# Patient Record
Sex: Female | Born: 1940
Health system: Southern US, Community
[De-identification: ages and names within clinical notes are randomized; demographics above are authoritative.]

## PROBLEM LIST (undated history)

## (undated) DIAGNOSIS — Z95828 Presence of other vascular implants and grafts: Secondary | ICD-10-CM

## (undated) DIAGNOSIS — E119 Type 2 diabetes mellitus without complications: Secondary | ICD-10-CM

## (undated) DIAGNOSIS — D126 Benign neoplasm of colon, unspecified: Secondary | ICD-10-CM

## (undated) DIAGNOSIS — E349 Endocrine disorder, unspecified: Secondary | ICD-10-CM

## (undated) DIAGNOSIS — E785 Hyperlipidemia, unspecified: Secondary | ICD-10-CM

## (undated) DIAGNOSIS — N189 Chronic kidney disease, unspecified: Secondary | ICD-10-CM

## (undated) DIAGNOSIS — G4733 Obstructive sleep apnea (adult) (pediatric): Secondary | ICD-10-CM

## (undated) DIAGNOSIS — G709 Myoneural disorder, unspecified: Secondary | ICD-10-CM

## (undated) DIAGNOSIS — Z87440 Personal history of urinary (tract) infections: Secondary | ICD-10-CM

## (undated) DIAGNOSIS — G63 Polyneuropathy in diseases classified elsewhere: Secondary | ICD-10-CM

## (undated) DIAGNOSIS — K219 Gastro-esophageal reflux disease without esophagitis: Secondary | ICD-10-CM

## (undated) DIAGNOSIS — E875 Hyperkalemia: Secondary | ICD-10-CM

## (undated) DIAGNOSIS — Z9989 Dependence on other enabling machines and devices: Secondary | ICD-10-CM

## (undated) DIAGNOSIS — N2581 Secondary hyperparathyroidism of renal origin: Secondary | ICD-10-CM

## (undated) DIAGNOSIS — D649 Anemia, unspecified: Secondary | ICD-10-CM

## (undated) DIAGNOSIS — Z8719 Personal history of other diseases of the digestive system: Secondary | ICD-10-CM

## (undated) DIAGNOSIS — M1990 Unspecified osteoarthritis, unspecified site: Secondary | ICD-10-CM

## (undated) DIAGNOSIS — M4802 Spinal stenosis, cervical region: Secondary | ICD-10-CM

## (undated) DIAGNOSIS — E13319 Other specified diabetes mellitus with unspecified diabetic retinopathy without macular edema: Secondary | ICD-10-CM

## (undated) DIAGNOSIS — H409 Unspecified glaucoma: Secondary | ICD-10-CM

## (undated) DIAGNOSIS — H269 Unspecified cataract: Secondary | ICD-10-CM

## (undated) DIAGNOSIS — I890 Lymphedema, not elsewhere classified: Secondary | ICD-10-CM

## (undated) DIAGNOSIS — I10 Essential (primary) hypertension: Secondary | ICD-10-CM

## (undated) DIAGNOSIS — R195 Other fecal abnormalities: Secondary | ICD-10-CM

## (undated) DIAGNOSIS — M48061 Spinal stenosis, lumbar region without neurogenic claudication: Secondary | ICD-10-CM

## (undated) DIAGNOSIS — E669 Obesity, unspecified: Secondary | ICD-10-CM

## (undated) HISTORY — DX: Anemia, unspecified: D64.9

## (undated) HISTORY — PX: VEIN LIGATION AND STRIPPING: SHX2653

## (undated) HISTORY — DX: Secondary hyperparathyroidism of renal origin: N25.81

## (undated) HISTORY — DX: Hyperkalemia: E87.5

## (undated) HISTORY — DX: Type 2 diabetes mellitus without complications: E11.9

## (undated) HISTORY — DX: Unspecified cataract: H26.9

## (undated) HISTORY — DX: Endocrine disorder, unspecified: G63

## (undated) HISTORY — DX: Spinal stenosis, lumbar region without neurogenic claudication: M48.061

## (undated) HISTORY — DX: Presence of other vascular implants and grafts: Z95.828

## (undated) HISTORY — DX: Unspecified osteoarthritis, unspecified site: M19.90

## (undated) HISTORY — DX: Other specified diabetes mellitus with unspecified diabetic retinopathy without macular edema: E13.319

## (undated) HISTORY — DX: Unspecified glaucoma: H40.9

## (undated) HISTORY — DX: Essential (primary) hypertension: I10

## (undated) HISTORY — DX: Spinal stenosis, cervical region: M48.02

## (undated) HISTORY — DX: Personal history of other diseases of the digestive system: Z87.19

## (undated) HISTORY — DX: Dependence on other enabling machines and devices: Z99.89

## (undated) HISTORY — DX: Gastro-esophageal reflux disease without esophagitis: K21.9

## (undated) HISTORY — DX: Obstructive sleep apnea (adult) (pediatric): G47.33

## (undated) HISTORY — DX: Hyperlipidemia, unspecified: E78.5

## (undated) HISTORY — DX: Benign neoplasm of colon, unspecified: D12.6

## (undated) HISTORY — PX: OTHER SURGICAL HISTORY: SHX169

## (undated) HISTORY — PX: BACK SURGERY: SHX140

## (undated) HISTORY — PX: CHOLECYSTECTOMY: SHX55

## (undated) HISTORY — DX: Personal history of urinary (tract) infections: Z87.440

## (undated) HISTORY — DX: Chronic kidney disease, unspecified: N18.9

## (undated) HISTORY — DX: Endocrine disorder, unspecified: E34.9

## (undated) HISTORY — DX: Polyneuropathy in diseases classified elsewhere: E34.9

---

## 2003-10-10 ENCOUNTER — Other Ambulatory Visit: Payer: Self-pay

## 2004-11-13 ENCOUNTER — Ambulatory Visit: Payer: Self-pay | Admitting: Internal Medicine

## 2005-03-11 ENCOUNTER — Emergency Department: Payer: Self-pay | Admitting: Emergency Medicine

## 2005-12-04 ENCOUNTER — Ambulatory Visit: Payer: Self-pay | Admitting: Unknown Physician Specialty

## 2006-01-12 ENCOUNTER — Ambulatory Visit: Payer: Self-pay | Admitting: Unknown Physician Specialty

## 2006-03-11 ENCOUNTER — Ambulatory Visit: Payer: Self-pay | Admitting: Internal Medicine

## 2007-03-31 ENCOUNTER — Ambulatory Visit: Payer: Self-pay | Admitting: Family Medicine

## 2009-01-07 ENCOUNTER — Emergency Department: Payer: Self-pay | Admitting: Emergency Medicine

## 2010-01-12 ENCOUNTER — Inpatient Hospital Stay: Payer: Self-pay | Admitting: Internal Medicine

## 2010-03-19 ENCOUNTER — Encounter: Payer: Self-pay | Admitting: Neurosurgery

## 2010-04-09 ENCOUNTER — Encounter: Payer: Self-pay | Admitting: Neurosurgery

## 2010-05-08 ENCOUNTER — Encounter: Payer: Self-pay | Admitting: Neurosurgery

## 2010-07-10 ENCOUNTER — Ambulatory Visit: Payer: Self-pay | Admitting: Family Medicine

## 2010-07-29 ENCOUNTER — Ambulatory Visit: Payer: Self-pay | Admitting: Family Medicine

## 2011-04-10 ENCOUNTER — Ambulatory Visit: Payer: Self-pay | Admitting: Ophthalmology

## 2011-04-10 DIAGNOSIS — I10 Essential (primary) hypertension: Secondary | ICD-10-CM

## 2011-04-21 ENCOUNTER — Ambulatory Visit: Payer: Self-pay | Admitting: Ophthalmology

## 2011-05-22 ENCOUNTER — Ambulatory Visit: Payer: Self-pay | Admitting: Unknown Physician Specialty

## 2011-06-08 ENCOUNTER — Ambulatory Visit: Payer: Self-pay | Admitting: Internal Medicine

## 2011-06-12 ENCOUNTER — Ambulatory Visit: Payer: Self-pay | Admitting: Internal Medicine

## 2011-06-16 LAB — HEPATIC FUNCTION PANEL A (ARMC)
Alkaline Phosphatase: 98 U/L (ref 50–136)
Bilirubin, Direct: 0.1 mg/dL (ref 0.00–0.20)
Bilirubin,Total: 0.4 mg/dL (ref 0.2–1.0)
SGOT(AST): 38 U/L — ABNORMAL HIGH (ref 15–37)

## 2011-06-16 LAB — LACTATE DEHYDROGENASE: LDH: 254 U/L — ABNORMAL HIGH (ref 84–246)

## 2011-06-16 LAB — CREATININE, SERUM
Creatinine: 1.43 mg/dL — ABNORMAL HIGH (ref 0.60–1.30)
EGFR (African American): 46 — ABNORMAL LOW
EGFR (Non-African Amer.): 38 — ABNORMAL LOW

## 2011-06-16 LAB — CBC CANCER CENTER
Eosinophil %: 3.5 %
HGB: 11.1 g/dL — ABNORMAL LOW (ref 12.0–16.0)
Lymphocyte #: 1.9 x10 3/mm (ref 1.0–3.6)
Lymphocyte %: 34.8 %
MCHC: 32.8 g/dL (ref 32.0–36.0)
MCV: 91.1 fL (ref 80–100)
Monocyte %: 7.5 %
Platelet: 244 x10 3/mm (ref 150–440)
RBC: 3.71 10*6/uL — ABNORMAL LOW (ref 3.80–5.20)
RDW: 13.5 % (ref 11.5–14.5)
WBC: 5.6 x10 3/mm (ref 3.6–11.0)

## 2011-06-16 LAB — RETICULOCYTES
Absolute Retic Count: 0.113 10*6/uL — ABNORMAL HIGH (ref 0.024–0.084)
Reticulocyte: 3.1 % — ABNORMAL HIGH (ref 0.5–1.5)

## 2011-06-16 LAB — IRON AND TIBC
Iron Saturation: 25 %
Unbound Iron-Bind.Cap.: 258 ug/dL

## 2011-06-19 LAB — PROT IMMUNOELECTROPHORES(ARMC)

## 2011-07-07 LAB — CANCER CENTER HEMOGLOBIN: HGB: 11.1 g/dL — ABNORMAL LOW (ref 12.0–16.0)

## 2011-07-08 ENCOUNTER — Ambulatory Visit: Payer: Self-pay | Admitting: Internal Medicine

## 2011-08-08 ENCOUNTER — Ambulatory Visit: Payer: Self-pay | Admitting: Internal Medicine

## 2011-08-18 ENCOUNTER — Ambulatory Visit: Payer: Self-pay | Admitting: Internal Medicine

## 2011-08-18 LAB — URINALYSIS, COMPLETE
Bilirubin,UR: NEGATIVE
Ketone: NEGATIVE
Leukocyte Esterase: NEGATIVE
Nitrite: NEGATIVE
Ph: 5 (ref 4.5–8.0)
Protein: NEGATIVE
Specific Gravity: 1.015 (ref 1.003–1.030)
Squamous Epithelial: >30

## 2011-08-18 LAB — CBC CANCER CENTER
Basophil #: 0.1 x10 3/mm (ref 0.0–0.1)
Basophil %: 1.1 %
Eosinophil #: 0.2 x10 3/mm (ref 0.0–0.7)
HCT: 33.8 % — ABNORMAL LOW (ref 35.0–47.0)
Lymphocyte #: 1.6 x10 3/mm (ref 1.0–3.6)
MCHC: 32.7 g/dL (ref 32.0–36.0)
MCV: 93 fL (ref 80–100)
Monocyte #: 0.5 x10 3/mm (ref 0.2–0.9)
Neutrophil #: 3.9 x10 3/mm (ref 1.4–6.5)
Neutrophil %: 62.4 %
Platelet: 226 x10 3/mm (ref 150–440)
RDW: 14.3 % (ref 11.5–14.5)

## 2011-08-18 LAB — CREATININE, SERUM
EGFR (African American): 44 — ABNORMAL LOW
EGFR (Non-African Amer.): 38 — ABNORMAL LOW

## 2011-08-18 LAB — SGOT (AST)(ARMC): SGOT(AST): 22 U/L (ref 15–37)

## 2011-08-19 LAB — URINE CULTURE

## 2011-08-28 ENCOUNTER — Ambulatory Visit: Payer: Self-pay | Admitting: Family Medicine

## 2011-09-07 ENCOUNTER — Ambulatory Visit: Payer: Self-pay | Admitting: Internal Medicine

## 2011-09-30 DIAGNOSIS — M48062 Spinal stenosis, lumbar region with neurogenic claudication: Secondary | ICD-10-CM | POA: Insufficient documentation

## 2011-10-13 ENCOUNTER — Ambulatory Visit: Payer: Self-pay | Admitting: Internal Medicine

## 2011-10-13 LAB — CBC CANCER CENTER
Basophil #: 0 x10 3/mm (ref 0.0–0.1)
Eosinophil #: 0.2 x10 3/mm (ref 0.0–0.7)
Lymphocyte %: 25.4 %
MCH: 30.2 pg (ref 26.0–34.0)
MCHC: 32.9 g/dL (ref 32.0–36.0)
Monocyte %: 8.2 %
Platelet: 220 x10 3/mm (ref 150–440)
RDW: 14.2 % (ref 11.5–14.5)

## 2011-10-20 ENCOUNTER — Ambulatory Visit: Payer: Self-pay | Admitting: Pain Medicine

## 2011-10-28 ENCOUNTER — Ambulatory Visit: Payer: Self-pay | Admitting: Pain Medicine

## 2011-11-08 ENCOUNTER — Ambulatory Visit: Payer: Self-pay | Admitting: Internal Medicine

## 2011-11-24 ENCOUNTER — Ambulatory Visit: Payer: Self-pay | Admitting: Pain Medicine

## 2011-12-07 ENCOUNTER — Ambulatory Visit: Payer: Self-pay | Admitting: Pain Medicine

## 2011-12-08 ENCOUNTER — Ambulatory Visit: Payer: Self-pay | Admitting: Internal Medicine

## 2011-12-08 LAB — CBC CANCER CENTER
Basophil #: 0.1 x10 3/mm (ref 0.0–0.1)
Basophil %: 0.4 %
Eosinophil #: 0 x10 3/mm (ref 0.0–0.7)
HCT: 33.4 % — ABNORMAL LOW (ref 35.0–47.0)
Lymphocyte #: 1.7 x10 3/mm (ref 1.0–3.6)
MCHC: 32.2 g/dL (ref 32.0–36.0)
MCV: 92 fL (ref 80–100)
Monocyte #: 1 x10 3/mm — ABNORMAL HIGH (ref 0.2–0.9)
Monocyte %: 7.8 %
Neutrophil #: 10.2 x10 3/mm — ABNORMAL HIGH (ref 1.4–6.5)
Platelet: 235 x10 3/mm (ref 150–440)
RBC: 3.64 10*6/uL — ABNORMAL LOW (ref 3.80–5.20)
RDW: 14.1 % (ref 11.5–14.5)
WBC: 13 x10 3/mm — ABNORMAL HIGH (ref 3.6–11.0)

## 2011-12-29 ENCOUNTER — Ambulatory Visit: Payer: Self-pay | Admitting: Pain Medicine

## 2012-01-08 ENCOUNTER — Ambulatory Visit: Payer: Self-pay | Admitting: Internal Medicine

## 2012-01-13 ENCOUNTER — Ambulatory Visit: Payer: Self-pay | Admitting: Pain Medicine

## 2012-02-02 LAB — CBC CANCER CENTER
Basophil #: 0.1 x10 3/mm (ref 0.0–0.1)
Basophil %: 0.8 %
Eosinophil %: 3 %
HCT: 34.9 % — ABNORMAL LOW (ref 35.0–47.0)
Lymphocyte #: 1.8 x10 3/mm (ref 1.0–3.6)
MCH: 30.1 pg (ref 26.0–34.0)
MCV: 93 fL (ref 80–100)
Monocyte #: 0.6 x10 3/mm (ref 0.2–0.9)
Monocyte %: 8.3 %
Neutrophil %: 62.8 %
Platelet: 208 x10 3/mm (ref 150–440)
RBC: 3.77 10*6/uL — ABNORMAL LOW (ref 3.80–5.20)
WBC: 7.2 x10 3/mm (ref 3.6–11.0)

## 2012-02-02 LAB — CREATININE, SERUM
Creatinine: 1.46 mg/dL — ABNORMAL HIGH (ref 0.60–1.30)
EGFR (Non-African Amer.): 35 — ABNORMAL LOW

## 2012-02-07 ENCOUNTER — Ambulatory Visit: Payer: Self-pay | Admitting: Internal Medicine

## 2012-02-17 ENCOUNTER — Ambulatory Visit: Payer: Self-pay | Admitting: Pain Medicine

## 2012-03-07 ENCOUNTER — Ambulatory Visit: Payer: Self-pay | Admitting: Pain Medicine

## 2012-03-29 ENCOUNTER — Ambulatory Visit: Payer: Self-pay | Admitting: Internal Medicine

## 2012-03-29 LAB — CBC CANCER CENTER
HGB: 10.5 g/dL — ABNORMAL LOW (ref 12.0–16.0)
Lymphocyte #: 1.6 x10 3/mm (ref 1.0–3.6)
Lymphocyte %: 29.4 %
MCH: 29.7 pg (ref 26.0–34.0)
MCV: 91 fL (ref 80–100)
Platelet: 215 x10 3/mm (ref 150–440)
RBC: 3.52 10*6/uL — ABNORMAL LOW (ref 3.80–5.20)
RDW: 15.4 % — ABNORMAL HIGH (ref 11.5–14.5)
WBC: 5.5 x10 3/mm (ref 3.6–11.0)

## 2012-03-31 ENCOUNTER — Ambulatory Visit: Payer: Self-pay | Admitting: Pain Medicine

## 2012-04-09 ENCOUNTER — Ambulatory Visit: Payer: Self-pay | Admitting: Internal Medicine

## 2012-04-11 ENCOUNTER — Ambulatory Visit: Payer: Self-pay | Admitting: Pain Medicine

## 2012-04-26 LAB — CANCER CENTER HEMOGLOBIN: HGB: 10.3 g/dL — ABNORMAL LOW (ref 12.0–16.0)

## 2012-04-26 LAB — OCCULT BLOOD X 1 CARD TO LAB, STOOL
Occult Blood, Feces: NEGATIVE
Occult Blood, Feces: NEGATIVE
Occult Blood, Feces: NEGATIVE

## 2012-05-03 ENCOUNTER — Ambulatory Visit: Payer: Self-pay | Admitting: Pain Medicine

## 2012-05-07 ENCOUNTER — Ambulatory Visit: Payer: Self-pay | Admitting: Internal Medicine

## 2012-06-01 ENCOUNTER — Ambulatory Visit: Payer: Self-pay | Admitting: Pain Medicine

## 2012-06-21 ENCOUNTER — Ambulatory Visit: Payer: Self-pay | Admitting: Internal Medicine

## 2012-06-21 ENCOUNTER — Ambulatory Visit: Payer: Self-pay | Admitting: Pain Medicine

## 2012-06-21 LAB — CBC CANCER CENTER
Basophil #: 0.1 x10 3/mm (ref 0.0–0.1)
Basophil %: 0.8 %
HGB: 10.9 g/dL — ABNORMAL LOW (ref 12.0–16.0)
Lymphocyte #: 1.8 x10 3/mm (ref 1.0–3.6)
MCH: 29.6 pg (ref 26.0–34.0)
MCHC: 32.1 g/dL (ref 32.0–36.0)
MCV: 92 fL (ref 80–100)
Neutrophil %: 56.8 %
WBC: 6.4 x10 3/mm (ref 3.6–11.0)

## 2012-07-07 ENCOUNTER — Ambulatory Visit: Payer: Self-pay | Admitting: Internal Medicine

## 2012-07-21 ENCOUNTER — Ambulatory Visit: Payer: Self-pay | Admitting: Pain Medicine

## 2012-08-16 ENCOUNTER — Ambulatory Visit: Payer: Self-pay | Admitting: Internal Medicine

## 2012-08-16 LAB — CANCER CENTER HEMOGLOBIN: HGB: 10.5 g/dL — ABNORMAL LOW (ref 12.0–16.0)

## 2012-09-06 ENCOUNTER — Ambulatory Visit: Payer: Self-pay | Admitting: Internal Medicine

## 2012-10-11 ENCOUNTER — Ambulatory Visit: Payer: Self-pay | Admitting: Internal Medicine

## 2012-10-11 LAB — CBC CANCER CENTER
Basophil %: 0.9 %
Eosinophil #: 0.2 x10 3/mm (ref 0.0–0.7)
Eosinophil %: 3.2 %
HCT: 31.4 % — ABNORMAL LOW (ref 35.0–47.0)
HGB: 10.4 g/dL — ABNORMAL LOW (ref 12.0–16.0)
Lymphocyte #: 2 x10 3/mm (ref 1.0–3.6)
Lymphocyte %: 29.1 %
MCHC: 33 g/dL (ref 32.0–36.0)
MCV: 91 fL (ref 80–100)
Monocyte #: 0.6 x10 3/mm (ref 0.2–0.9)
Monocyte %: 8.8 %
Neutrophil #: 4 x10 3/mm (ref 1.4–6.5)
Neutrophil %: 58 %
RDW: 14.3 % (ref 11.5–14.5)
WBC: 6.9 x10 3/mm (ref 3.6–11.0)

## 2012-10-23 ENCOUNTER — Emergency Department: Payer: Self-pay | Admitting: Emergency Medicine

## 2012-10-23 LAB — BASIC METABOLIC PANEL
Anion Gap: 4 — ABNORMAL LOW (ref 7–16)
BUN: 21 mg/dL — ABNORMAL HIGH (ref 7–18)
Calcium, Total: 10.3 mg/dL — ABNORMAL HIGH (ref 8.5–10.1)
Chloride: 107 mmol/L (ref 98–107)
Creatinine: 1.23 mg/dL (ref 0.60–1.30)
EGFR (Non-African Amer.): 43 — ABNORMAL LOW
Glucose: 179 mg/dL — ABNORMAL HIGH (ref 65–99)
Potassium: 4.4 mmol/L (ref 3.5–5.1)
Sodium: 137 mmol/L (ref 136–145)

## 2012-10-23 LAB — CBC
HCT: 32.5 % — ABNORMAL LOW (ref 35.0–47.0)
HGB: 10.9 g/dL — ABNORMAL LOW (ref 12.0–16.0)
MCH: 30.4 pg (ref 26.0–34.0)
RBC: 3.6 10*6/uL — ABNORMAL LOW (ref 3.80–5.20)
RDW: 14.4 % (ref 11.5–14.5)
WBC: 8.4 10*3/uL (ref 3.6–11.0)

## 2012-10-24 LAB — URINALYSIS, COMPLETE
Bacteria: NONE SEEN
Bilirubin,UR: NEGATIVE
Glucose,UR: NEGATIVE mg/dL (ref 0–75)
Ph: 5 (ref 4.5–8.0)
Squamous Epithelial: 10
WBC UR: 276 /HPF (ref 0–5)

## 2012-10-26 LAB — URINE CULTURE

## 2012-11-07 ENCOUNTER — Ambulatory Visit: Payer: Self-pay | Admitting: Internal Medicine

## 2012-12-06 LAB — CANCER CENTER HEMOGLOBIN: HGB: 10.4 g/dL — ABNORMAL LOW (ref 12.0–16.0)

## 2012-12-07 ENCOUNTER — Ambulatory Visit: Payer: Self-pay | Admitting: Internal Medicine

## 2013-01-24 ENCOUNTER — Ambulatory Visit: Payer: Self-pay | Admitting: Internal Medicine

## 2013-01-24 LAB — CBC CANCER CENTER
Basophil #: 0.1 x10 3/mm (ref 0.0–0.1)
Basophil %: 1 %
Eosinophil %: 3.1 %
HCT: 33.8 % — ABNORMAL LOW (ref 35.0–47.0)
Lymphocyte #: 1.6 x10 3/mm (ref 1.0–3.6)
MCH: 29.3 pg (ref 26.0–34.0)
MCHC: 31.6 g/dL — ABNORMAL LOW (ref 32.0–36.0)
MCV: 93 fL (ref 80–100)
Monocyte %: 7.3 %
Neutrophil %: 64.3 %
Platelet: 237 x10 3/mm (ref 150–440)
RDW: 14.2 % (ref 11.5–14.5)

## 2013-02-06 ENCOUNTER — Ambulatory Visit: Payer: Self-pay | Admitting: Internal Medicine

## 2013-03-21 ENCOUNTER — Ambulatory Visit: Payer: Self-pay | Admitting: Internal Medicine

## 2013-03-21 LAB — CANCER CENTER HEMOGLOBIN: HGB: 10.7 g/dL — AB (ref 12.0–16.0)

## 2013-04-09 ENCOUNTER — Ambulatory Visit: Payer: Self-pay | Admitting: Internal Medicine

## 2013-05-19 ENCOUNTER — Other Ambulatory Visit (HOSPITAL_COMMUNITY): Payer: Self-pay | Admitting: Internal Medicine

## 2013-06-20 ENCOUNTER — Ambulatory Visit: Payer: Self-pay | Admitting: Internal Medicine

## 2013-06-20 LAB — CBC CANCER CENTER
BASOS PCT: 1.1 %
Basophil #: 0.1 x10 3/mm (ref 0.0–0.1)
EOS ABS: 0.3 x10 3/mm (ref 0.0–0.7)
Eosinophil %: 4.7 %
HCT: 31.2 % — ABNORMAL LOW (ref 35.0–47.0)
HGB: 10 g/dL — ABNORMAL LOW (ref 12.0–16.0)
LYMPHS PCT: 29.3 %
Lymphocyte #: 1.9 x10 3/mm (ref 1.0–3.6)
MCH: 29.7 pg (ref 26.0–34.0)
MCHC: 32 g/dL (ref 32.0–36.0)
MCV: 93 fL (ref 80–100)
Monocyte #: 0.6 x10 3/mm (ref 0.2–0.9)
Monocyte %: 8.7 %
Neutrophil #: 3.6 x10 3/mm (ref 1.4–6.5)
Neutrophil %: 56.2 %
Platelet: 221 x10 3/mm (ref 150–440)
RBC: 3.37 10*6/uL — ABNORMAL LOW (ref 3.80–5.20)
RDW: 14.2 % (ref 11.5–14.5)
WBC: 6.4 x10 3/mm (ref 3.6–11.0)

## 2013-07-07 ENCOUNTER — Ambulatory Visit: Payer: Self-pay | Admitting: Internal Medicine

## 2013-07-11 ENCOUNTER — Ambulatory Visit: Payer: Self-pay | Admitting: Internal Medicine

## 2013-07-11 LAB — CANCER CENTER HEMOGLOBIN: HGB: 10.8 g/dL — AB (ref 12.0–16.0)

## 2013-08-07 ENCOUNTER — Ambulatory Visit: Payer: Self-pay | Admitting: Internal Medicine

## 2013-09-19 ENCOUNTER — Ambulatory Visit: Payer: Self-pay | Admitting: Internal Medicine

## 2013-09-19 LAB — CBC CANCER CENTER
Basophil #: 0.1 x10 3/mm (ref 0.0–0.1)
Basophil %: 0.9 %
Eosinophil #: 0.2 x10 3/mm (ref 0.0–0.7)
Eosinophil %: 3.3 %
HCT: 32.5 % — AB (ref 35.0–47.0)
HGB: 10.5 g/dL — ABNORMAL LOW (ref 12.0–16.0)
LYMPHS ABS: 2 x10 3/mm (ref 1.0–3.6)
LYMPHS PCT: 28.1 %
MCH: 29.9 pg (ref 26.0–34.0)
MCHC: 32.1 g/dL (ref 32.0–36.0)
MCV: 93 fL (ref 80–100)
MONO ABS: 0.6 x10 3/mm (ref 0.2–0.9)
MONOS PCT: 8.5 %
NEUTROS PCT: 59.2 %
Neutrophil #: 4.3 x10 3/mm (ref 1.4–6.5)
Platelet: 234 x10 3/mm (ref 150–440)
RBC: 3.49 10*6/uL — ABNORMAL LOW (ref 3.80–5.20)
RDW: 14.4 % (ref 11.5–14.5)
WBC: 7.2 x10 3/mm (ref 3.6–11.0)

## 2013-10-07 ENCOUNTER — Ambulatory Visit: Payer: Self-pay | Admitting: Internal Medicine

## 2014-01-23 ENCOUNTER — Ambulatory Visit: Payer: Self-pay | Admitting: Internal Medicine

## 2014-01-23 LAB — CBC CANCER CENTER
Basophil #: 0.1 x10 3/mm (ref 0.0–0.1)
Basophil %: 1.1 %
Eosinophil #: 0.2 x10 3/mm (ref 0.0–0.7)
Eosinophil %: 3.7 %
HCT: 31.6 % — ABNORMAL LOW (ref 35.0–47.0)
HGB: 10 g/dL — ABNORMAL LOW (ref 12.0–16.0)
Lymphocyte #: 1.5 x10 3/mm (ref 1.0–3.6)
Lymphocyte %: 30.8 %
MCH: 29.9 pg (ref 26.0–34.0)
MCHC: 31.7 g/dL — ABNORMAL LOW (ref 32.0–36.0)
MCV: 94 fL (ref 80–100)
MONOS PCT: 10.3 %
Monocyte #: 0.5 x10 3/mm (ref 0.2–0.9)
NEUTROS PCT: 54.1 %
Neutrophil #: 2.7 x10 3/mm (ref 1.4–6.5)
PLATELETS: 193 x10 3/mm (ref 150–440)
RBC: 3.36 10*6/uL — AB (ref 3.80–5.20)
RDW: 15.1 % — AB (ref 11.5–14.5)
WBC: 5 x10 3/mm (ref 3.6–11.0)

## 2014-02-06 ENCOUNTER — Ambulatory Visit: Payer: Self-pay | Admitting: Internal Medicine

## 2014-03-20 ENCOUNTER — Ambulatory Visit: Payer: Self-pay | Admitting: Ophthalmology

## 2014-03-20 LAB — HEMOGLOBIN: HGB: 10.8 g/dL — AB (ref 12.0–16.0)

## 2014-03-27 ENCOUNTER — Ambulatory Visit: Payer: Self-pay | Admitting: Ophthalmology

## 2014-05-23 ENCOUNTER — Ambulatory Visit
Admit: 2014-05-23 | Disposition: A | Discharge status: Home / Self Care | Payer: Self-pay | Attending: Internal Medicine | Admitting: Internal Medicine

## 2014-06-08 ENCOUNTER — Ambulatory Visit
Admit: 2014-06-08 | Disposition: A | Discharge status: Home / Self Care | Payer: Self-pay | Attending: Internal Medicine | Admitting: Internal Medicine

## 2014-07-01 NOTE — Op Note (Signed)
PATIENT NAME:  Sara Bright, Sara Bright MR#:  [id number] DATE OF BIRTH:  Mar 31, 1940  DATE OF PROCEDURE:  04/21/2011  PREOPERATIVE DIAGNOSIS: Visually significant cataract of the right eye.   POSTOPERATIVE DIAGNOSIS: Visually significant cataract of the right eye.   OPERATIVE PROCEDURE: Cataract extraction by phacoemulsification with implant of intraocular lens to right eye.   SURGEON: Birder Robson, MD.   ANESTHESIA:  1. Managed anesthesia care.  2. Topical tetracaine drops followed by 2% Xylocaine jelly applied in the preoperative holding area.   COMPLICATIONS: None.   TECHNIQUE:  Stop and chop.   DESCRIPTION OF PROCEDURE: The patient was examined and consented in the preoperative holding area where the aforementioned topical anesthesia was applied to the right eye and then brought back to the Operating Room where the right eye was prepped and draped in the usual sterile ophthalmic fashion and a lid speculum was placed. A paracentesis was created with the side port blade and the anterior chamber was filled with viscoelastic. A near clear corneal incision was performed with the steel keratome. A continuous curvilinear capsulorrhexis was performed with a cystotome followed by the capsulorrhexis forceps. Hydrodissection and hydrodelineation were carried out with BSS on a blunt cannula. The lens was removed in a stop and chop technique and the remaining cortical material was removed with the irrigation-aspiration handpiece. The capsular bag was inflated with viscoelastic and the ZCBOO 19.5-diopter lens, serial number [id number] was placed in the capsular bag without complication. The remaining viscoelastic was removed from the eye with the irrigation-aspiration handpiece. The wounds were hydrated. The anterior chamber was flushed with Miostat and the eye was inflated to physiologic pressure. The wounds were found to be water tight. The eye was dressed with Vigamox and Omnipred. The patient was given  protective glasses to wear throughout the day and a shield with which to sleep tonight. The patient was also given drops with which to begin a drop regimen today and will follow-up with me in one day.   ____________________________ Livingston Diones. Braylin Formby, MD wlp:slb D: 04/21/2011 21:45:47 ET T: 04/22/2011 09:10:43 ET JOB#: [id number]  cc: Tarini Carrier L. Dondre Catalfamo, MD, <Dictator> Livingston Diones Doyel Mulkern MD ELECTRONICALLY SIGNED 04/28/2011 12:26

## 2014-07-08 NOTE — Op Note (Signed)
PATIENT NAME:  Sara Bright, Sara Bright MR#:  [id number] DATE OF BIRTH:  May 25, 1940  DATE OF PROCEDURE:  03/27/2014  PREOPERATIVE DIAGNOSIS:  Nuclear sclerotic cataract of the left eye.   POSTOPERATIVE DIAGNOSIS:  Nuclear sclerotic cataract of the left eye.   OPERATIVE PROCEDURE:  Cataract extraction by phacoemulsification with implant of intraocular lens to left eye.   SURGEON:  Birder Robson, MD.   ANESTHESIA:  1. Managed anesthesia care.  2. Topical tetracaine drops followed by 2% Xylocaine jelly applied in the preoperative holding area.   COMPLICATIONS:  None.   TECHNIQUE:   Stop and chop.  DESCRIPTION OF PROCEDURE:  The patient was examined and consented in the preoperative holding area where the aforementioned topical anesthesia was applied to the left eye and then brought back to the operating room where the left eye was prepped and draped in the usual sterile ophthalmic fashion and a lid speculum was placed. A paracentesis was created with the side port blade and the anterior chamber was filled with viscoelastic. A near clear corneal incision was performed with the steel keratome. A continuous curvilinear capsulorrhexis was performed with a cystotome followed by the capsulorrhexis forceps. Hydrodissection and hydrodelineation were carried out with BSS on a blunt cannula. The lens was removed in a stop and chop technique and the remaining cortical material was removed with the irrigation-aspiration handpiece. The capsular bag was inflated with viscoelastic and the [employer] ZCB00 19.5-diopter lens, serial number [id number] was placed in the capsular bag without complication. The remaining viscoelastic was removed from the eye with the irrigation-aspiration handpiece. The wounds were hydrated. The anterior chamber was flushed with Miostat and the eye was inflated to physiologic pressure. 0.1 mL of cefuroxime concentration 10 mg/mL was placed in the anterior chamber. The wounds were found to be water  tight. The eye was dressed with Vigamox. The patient was given protective glasses to wear throughout the day and a shield with which to sleep tonight. The patient was also given drops with which to begin a drop regimen today and will follow-up with me in one day.    ____________________________ Livingston Diones. Chico Cawood, MD wlp:bm D: 03/27/2014 20:53:19 ET T: 03/27/2014 23:06:23 ET JOB#: [id number]  cc: Astaria Nanez L. Edit Ricciardelli, MD, <Dictator> Livingston Diones Seaira Byus MD ELECTRONICALLY SIGNED 03/28/2014 16:10

## 2014-09-25 ENCOUNTER — Ambulatory Visit: Payer: Medicare Other

## 2014-09-25 ENCOUNTER — Ambulatory Visit: Payer: Medicare Other | Admitting: Family Medicine

## 2014-11-07 ENCOUNTER — Other Ambulatory Visit: Payer: Self-pay | Admitting: Nurse Practitioner

## 2014-11-07 ENCOUNTER — Ambulatory Visit
Admission: RE | Admit: 2014-11-07 | Discharge: 2014-11-07 | Disposition: A | Discharge status: Home / Self Care | Payer: Medicare HMO | Source: Ambulatory Visit | Attending: Nurse Practitioner | Admitting: Nurse Practitioner

## 2014-11-07 DIAGNOSIS — Z1231 Encounter for screening mammogram for malignant neoplasm of breast: Secondary | ICD-10-CM | POA: Diagnosis not present

## 2014-12-11 DIAGNOSIS — R69 Illness, unspecified: Secondary | ICD-10-CM | POA: Diagnosis not present

## 2014-12-12 DIAGNOSIS — Z23 Encounter for immunization: Secondary | ICD-10-CM | POA: Diagnosis not present

## 2014-12-25 DIAGNOSIS — G629 Polyneuropathy, unspecified: Secondary | ICD-10-CM | POA: Diagnosis not present

## 2014-12-25 DIAGNOSIS — M79605 Pain in left leg: Secondary | ICD-10-CM | POA: Diagnosis not present

## 2014-12-29 DIAGNOSIS — R0602 Shortness of breath: Secondary | ICD-10-CM | POA: Diagnosis not present

## 2014-12-29 DIAGNOSIS — E11649 Type 2 diabetes mellitus with hypoglycemia without coma: Secondary | ICD-10-CM | POA: Diagnosis not present

## 2014-12-29 DIAGNOSIS — R9431 Abnormal electrocardiogram [ECG] [EKG]: Secondary | ICD-10-CM | POA: Diagnosis not present

## 2014-12-29 DIAGNOSIS — K5900 Constipation, unspecified: Secondary | ICD-10-CM | POA: Diagnosis not present

## 2014-12-29 DIAGNOSIS — R300 Dysuria: Secondary | ICD-10-CM | POA: Diagnosis not present

## 2014-12-29 DIAGNOSIS — R5383 Other fatigue: Secondary | ICD-10-CM | POA: Diagnosis not present

## 2014-12-29 DIAGNOSIS — R011 Cardiac murmur, unspecified: Secondary | ICD-10-CM | POA: Diagnosis not present

## 2014-12-29 DIAGNOSIS — R0600 Dyspnea, unspecified: Secondary | ICD-10-CM | POA: Diagnosis not present

## 2014-12-29 DIAGNOSIS — I10 Essential (primary) hypertension: Secondary | ICD-10-CM | POA: Diagnosis not present

## 2014-12-29 DIAGNOSIS — R600 Localized edema: Secondary | ICD-10-CM | POA: Diagnosis not present

## 2014-12-29 DIAGNOSIS — R531 Weakness: Secondary | ICD-10-CM | POA: Diagnosis not present

## 2014-12-29 DIAGNOSIS — I517 Cardiomegaly: Secondary | ICD-10-CM | POA: Diagnosis not present

## 2014-12-29 DIAGNOSIS — E1122 Type 2 diabetes mellitus with diabetic chronic kidney disease: Secondary | ICD-10-CM | POA: Diagnosis not present

## 2015-01-18 DIAGNOSIS — R69 Illness, unspecified: Secondary | ICD-10-CM | POA: Diagnosis not present

## 2015-01-23 DIAGNOSIS — R300 Dysuria: Secondary | ICD-10-CM | POA: Diagnosis not present

## 2015-01-23 DIAGNOSIS — E1122 Type 2 diabetes mellitus with diabetic chronic kidney disease: Secondary | ICD-10-CM | POA: Diagnosis not present

## 2015-01-23 DIAGNOSIS — M79605 Pain in left leg: Secondary | ICD-10-CM | POA: Diagnosis not present

## 2015-01-23 DIAGNOSIS — G629 Polyneuropathy, unspecified: Secondary | ICD-10-CM | POA: Diagnosis not present

## 2015-01-23 DIAGNOSIS — E1165 Type 2 diabetes mellitus with hyperglycemia: Secondary | ICD-10-CM | POA: Diagnosis not present

## 2015-01-23 DIAGNOSIS — N183 Chronic kidney disease, stage 3 (moderate): Secondary | ICD-10-CM | POA: Diagnosis not present

## 2015-02-13 DIAGNOSIS — R194 Change in bowel habit: Secondary | ICD-10-CM | POA: Diagnosis not present

## 2015-02-13 DIAGNOSIS — R600 Localized edema: Secondary | ICD-10-CM | POA: Diagnosis not present

## 2015-02-13 DIAGNOSIS — N183 Chronic kidney disease, stage 3 (moderate): Secondary | ICD-10-CM | POA: Diagnosis not present

## 2015-02-13 DIAGNOSIS — R0602 Shortness of breath: Secondary | ICD-10-CM | POA: Diagnosis not present

## 2015-02-19 DIAGNOSIS — E1165 Type 2 diabetes mellitus with hyperglycemia: Secondary | ICD-10-CM | POA: Diagnosis not present

## 2015-02-19 DIAGNOSIS — E1122 Type 2 diabetes mellitus with diabetic chronic kidney disease: Secondary | ICD-10-CM | POA: Diagnosis not present

## 2015-02-19 DIAGNOSIS — R0602 Shortness of breath: Secondary | ICD-10-CM | POA: Diagnosis not present

## 2015-02-19 DIAGNOSIS — N183 Chronic kidney disease, stage 3 (moderate): Secondary | ICD-10-CM | POA: Diagnosis not present

## 2015-02-19 DIAGNOSIS — I509 Heart failure, unspecified: Secondary | ICD-10-CM | POA: Diagnosis not present

## 2015-02-19 DIAGNOSIS — E875 Hyperkalemia: Secondary | ICD-10-CM | POA: Diagnosis not present

## 2015-02-19 DIAGNOSIS — R600 Localized edema: Secondary | ICD-10-CM | POA: Diagnosis not present

## 2015-03-06 DIAGNOSIS — M170 Bilateral primary osteoarthritis of knee: Secondary | ICD-10-CM | POA: Diagnosis not present

## 2015-03-10 DIAGNOSIS — G4733 Obstructive sleep apnea (adult) (pediatric): Secondary | ICD-10-CM | POA: Insufficient documentation

## 2015-03-21 DIAGNOSIS — E785 Hyperlipidemia, unspecified: Secondary | ICD-10-CM | POA: Diagnosis not present

## 2015-03-21 DIAGNOSIS — R600 Localized edema: Secondary | ICD-10-CM | POA: Diagnosis not present

## 2015-03-21 DIAGNOSIS — E875 Hyperkalemia: Secondary | ICD-10-CM | POA: Diagnosis not present

## 2015-03-21 DIAGNOSIS — N183 Chronic kidney disease, stage 3 (moderate): Secondary | ICD-10-CM | POA: Diagnosis not present

## 2015-03-21 DIAGNOSIS — I10 Essential (primary) hypertension: Secondary | ICD-10-CM | POA: Diagnosis not present

## 2015-03-21 DIAGNOSIS — D631 Anemia in chronic kidney disease: Secondary | ICD-10-CM | POA: Diagnosis not present

## 2015-04-02 DIAGNOSIS — R69 Illness, unspecified: Secondary | ICD-10-CM | POA: Diagnosis not present

## 2015-04-03 DIAGNOSIS — N183 Chronic kidney disease, stage 3 (moderate): Secondary | ICD-10-CM | POA: Diagnosis not present

## 2015-04-12 DIAGNOSIS — I10 Essential (primary) hypertension: Secondary | ICD-10-CM | POA: Diagnosis not present

## 2015-04-12 DIAGNOSIS — E1122 Type 2 diabetes mellitus with diabetic chronic kidney disease: Secondary | ICD-10-CM | POA: Diagnosis not present

## 2015-04-12 DIAGNOSIS — N2581 Secondary hyperparathyroidism of renal origin: Secondary | ICD-10-CM | POA: Diagnosis not present

## 2015-04-12 DIAGNOSIS — N183 Chronic kidney disease, stage 3 (moderate): Secondary | ICD-10-CM | POA: Diagnosis not present

## 2015-04-12 DIAGNOSIS — D631 Anemia in chronic kidney disease: Secondary | ICD-10-CM | POA: Diagnosis not present

## 2015-04-18 DIAGNOSIS — N189 Chronic kidney disease, unspecified: Secondary | ICD-10-CM | POA: Diagnosis not present

## 2015-04-19 ENCOUNTER — Other Ambulatory Visit: Payer: Self-pay | Admitting: Nephrology

## 2015-04-19 DIAGNOSIS — N1330 Unspecified hydronephrosis: Secondary | ICD-10-CM

## 2015-04-23 ENCOUNTER — Ambulatory Visit: Payer: Medicare HMO

## 2015-04-25 ENCOUNTER — Ambulatory Visit
Admission: RE | Admit: 2015-04-25 | Discharge: 2015-04-25 | Disposition: A | Discharge status: Home / Self Care | Payer: Medicare HMO | Source: Ambulatory Visit | Attending: Nephrology | Admitting: Nephrology

## 2015-04-25 DIAGNOSIS — N1330 Unspecified hydronephrosis: Secondary | ICD-10-CM | POA: Insufficient documentation

## 2015-04-29 DIAGNOSIS — H401111 Primary open-angle glaucoma, right eye, mild stage: Secondary | ICD-10-CM | POA: Diagnosis not present

## 2015-05-21 DIAGNOSIS — E1165 Type 2 diabetes mellitus with hyperglycemia: Secondary | ICD-10-CM | POA: Diagnosis not present

## 2015-05-21 DIAGNOSIS — E1122 Type 2 diabetes mellitus with diabetic chronic kidney disease: Secondary | ICD-10-CM | POA: Diagnosis not present

## 2015-05-21 DIAGNOSIS — G629 Polyneuropathy, unspecified: Secondary | ICD-10-CM | POA: Diagnosis not present

## 2015-05-21 DIAGNOSIS — E782 Mixed hyperlipidemia: Secondary | ICD-10-CM | POA: Diagnosis not present

## 2015-05-21 DIAGNOSIS — N183 Chronic kidney disease, stage 3 (moderate): Secondary | ICD-10-CM | POA: Diagnosis not present

## 2015-05-21 DIAGNOSIS — I10 Essential (primary) hypertension: Secondary | ICD-10-CM | POA: Diagnosis not present

## 2015-05-21 DIAGNOSIS — Z79899 Other long term (current) drug therapy: Secondary | ICD-10-CM | POA: Diagnosis not present

## 2015-05-29 DIAGNOSIS — H401111 Primary open-angle glaucoma, right eye, mild stage: Secondary | ICD-10-CM | POA: Diagnosis not present

## 2015-06-05 DIAGNOSIS — N2581 Secondary hyperparathyroidism of renal origin: Secondary | ICD-10-CM | POA: Diagnosis not present

## 2015-06-05 DIAGNOSIS — R809 Proteinuria, unspecified: Secondary | ICD-10-CM | POA: Diagnosis not present

## 2015-06-05 DIAGNOSIS — E1122 Type 2 diabetes mellitus with diabetic chronic kidney disease: Secondary | ICD-10-CM | POA: Diagnosis not present

## 2015-06-05 DIAGNOSIS — D631 Anemia in chronic kidney disease: Secondary | ICD-10-CM | POA: Diagnosis not present

## 2015-06-05 DIAGNOSIS — N183 Chronic kidney disease, stage 3 (moderate): Secondary | ICD-10-CM | POA: Diagnosis not present

## 2015-06-05 DIAGNOSIS — I10 Essential (primary) hypertension: Secondary | ICD-10-CM | POA: Diagnosis not present

## 2015-06-11 DIAGNOSIS — R69 Illness, unspecified: Secondary | ICD-10-CM | POA: Diagnosis not present

## 2015-06-25 DIAGNOSIS — J45909 Unspecified asthma, uncomplicated: Secondary | ICD-10-CM | POA: Diagnosis not present

## 2015-06-25 DIAGNOSIS — E6609 Other obesity due to excess calories: Secondary | ICD-10-CM | POA: Diagnosis not present

## 2015-06-25 DIAGNOSIS — R0609 Other forms of dyspnea: Secondary | ICD-10-CM | POA: Diagnosis not present

## 2015-06-25 DIAGNOSIS — I272 Other secondary pulmonary hypertension: Secondary | ICD-10-CM | POA: Diagnosis not present

## 2015-06-25 DIAGNOSIS — G479 Sleep disorder, unspecified: Secondary | ICD-10-CM | POA: Diagnosis not present

## 2015-06-25 DIAGNOSIS — R0602 Shortness of breath: Secondary | ICD-10-CM | POA: Diagnosis not present

## 2015-07-30 ENCOUNTER — Ambulatory Visit: Payer: Medicare HMO | Attending: Specialist

## 2015-07-30 DIAGNOSIS — G4733 Obstructive sleep apnea (adult) (pediatric): Secondary | ICD-10-CM | POA: Insufficient documentation

## 2015-07-30 DIAGNOSIS — R0683 Snoring: Secondary | ICD-10-CM | POA: Diagnosis present

## 2015-08-08 DIAGNOSIS — I272 Other secondary pulmonary hypertension: Secondary | ICD-10-CM | POA: Diagnosis not present

## 2015-08-08 DIAGNOSIS — R0609 Other forms of dyspnea: Secondary | ICD-10-CM | POA: Diagnosis not present

## 2015-08-08 DIAGNOSIS — G4733 Obstructive sleep apnea (adult) (pediatric): Secondary | ICD-10-CM | POA: Diagnosis not present

## 2015-08-21 DIAGNOSIS — G4733 Obstructive sleep apnea (adult) (pediatric): Secondary | ICD-10-CM | POA: Diagnosis not present

## 2015-09-11 DIAGNOSIS — R69 Illness, unspecified: Secondary | ICD-10-CM | POA: Diagnosis not present

## 2015-09-20 DIAGNOSIS — G4733 Obstructive sleep apnea (adult) (pediatric): Secondary | ICD-10-CM | POA: Diagnosis not present

## 2015-09-23 DIAGNOSIS — G4733 Obstructive sleep apnea (adult) (pediatric): Secondary | ICD-10-CM | POA: Diagnosis not present

## 2015-09-24 DIAGNOSIS — N2581 Secondary hyperparathyroidism of renal origin: Secondary | ICD-10-CM | POA: Diagnosis not present

## 2015-09-24 DIAGNOSIS — I10 Essential (primary) hypertension: Secondary | ICD-10-CM | POA: Diagnosis not present

## 2015-09-24 DIAGNOSIS — D631 Anemia in chronic kidney disease: Secondary | ICD-10-CM | POA: Diagnosis not present

## 2015-09-24 DIAGNOSIS — E1122 Type 2 diabetes mellitus with diabetic chronic kidney disease: Secondary | ICD-10-CM | POA: Diagnosis not present

## 2015-09-24 DIAGNOSIS — N183 Chronic kidney disease, stage 3 (moderate): Secondary | ICD-10-CM | POA: Diagnosis not present

## 2015-09-30 DIAGNOSIS — I10 Essential (primary) hypertension: Secondary | ICD-10-CM | POA: Diagnosis not present

## 2015-09-30 DIAGNOSIS — Z9989 Dependence on other enabling machines and devices: Secondary | ICD-10-CM | POA: Diagnosis not present

## 2015-09-30 DIAGNOSIS — E1122 Type 2 diabetes mellitus with diabetic chronic kidney disease: Secondary | ICD-10-CM | POA: Diagnosis not present

## 2015-09-30 DIAGNOSIS — I83029 Varicose veins of left lower extremity with ulcer of unspecified site: Secondary | ICD-10-CM | POA: Diagnosis not present

## 2015-09-30 DIAGNOSIS — D638 Anemia in other chronic diseases classified elsewhere: Secondary | ICD-10-CM | POA: Diagnosis not present

## 2015-09-30 DIAGNOSIS — G4733 Obstructive sleep apnea (adult) (pediatric): Secondary | ICD-10-CM | POA: Diagnosis not present

## 2015-09-30 DIAGNOSIS — N183 Chronic kidney disease, stage 3 (moderate): Secondary | ICD-10-CM | POA: Diagnosis not present

## 2015-10-02 DIAGNOSIS — E785 Hyperlipidemia, unspecified: Secondary | ICD-10-CM | POA: Diagnosis not present

## 2015-10-02 DIAGNOSIS — M79609 Pain in unspecified limb: Secondary | ICD-10-CM | POA: Diagnosis not present

## 2015-10-02 DIAGNOSIS — M7989 Other specified soft tissue disorders: Secondary | ICD-10-CM | POA: Diagnosis not present

## 2015-10-02 DIAGNOSIS — E119 Type 2 diabetes mellitus without complications: Secondary | ICD-10-CM | POA: Diagnosis not present

## 2015-10-05 DIAGNOSIS — Z6841 Body Mass Index (BMI) 40.0 and over, adult: Secondary | ICD-10-CM | POA: Insufficient documentation

## 2015-10-08 ENCOUNTER — Inpatient Hospital Stay: Payer: Medicare HMO | Attending: Internal Medicine | Admitting: Internal Medicine

## 2015-10-08 ENCOUNTER — Other Ambulatory Visit: Payer: Self-pay | Admitting: *Deleted

## 2015-10-08 ENCOUNTER — Inpatient Hospital Stay: Payer: Medicare HMO

## 2015-10-08 DIAGNOSIS — N2581 Secondary hyperparathyroidism of renal origin: Secondary | ICD-10-CM | POA: Insufficient documentation

## 2015-10-08 DIAGNOSIS — I129 Hypertensive chronic kidney disease with stage 1 through stage 4 chronic kidney disease, or unspecified chronic kidney disease: Secondary | ICD-10-CM | POA: Diagnosis not present

## 2015-10-08 DIAGNOSIS — M4802 Spinal stenosis, cervical region: Secondary | ICD-10-CM | POA: Diagnosis not present

## 2015-10-08 DIAGNOSIS — E1140 Type 2 diabetes mellitus with diabetic neuropathy, unspecified: Secondary | ICD-10-CM | POA: Insufficient documentation

## 2015-10-08 DIAGNOSIS — D509 Iron deficiency anemia, unspecified: Secondary | ICD-10-CM

## 2015-10-08 DIAGNOSIS — M4806 Spinal stenosis, lumbar region: Secondary | ICD-10-CM | POA: Diagnosis not present

## 2015-10-08 DIAGNOSIS — R0602 Shortness of breath: Secondary | ICD-10-CM | POA: Insufficient documentation

## 2015-10-08 DIAGNOSIS — Z79899 Other long term (current) drug therapy: Secondary | ICD-10-CM | POA: Diagnosis not present

## 2015-10-08 DIAGNOSIS — M1990 Unspecified osteoarthritis, unspecified site: Secondary | ICD-10-CM | POA: Insufficient documentation

## 2015-10-08 DIAGNOSIS — N189 Chronic kidney disease, unspecified: Secondary | ICD-10-CM | POA: Insufficient documentation

## 2015-10-08 DIAGNOSIS — N183 Chronic kidney disease, stage 3 unspecified: Secondary | ICD-10-CM

## 2015-10-08 DIAGNOSIS — R5383 Other fatigue: Secondary | ICD-10-CM | POA: Diagnosis not present

## 2015-10-08 DIAGNOSIS — E875 Hyperkalemia: Secondary | ICD-10-CM | POA: Diagnosis not present

## 2015-10-08 DIAGNOSIS — E785 Hyperlipidemia, unspecified: Secondary | ICD-10-CM

## 2015-10-08 DIAGNOSIS — E1122 Type 2 diabetes mellitus with diabetic chronic kidney disease: Secondary | ICD-10-CM | POA: Insufficient documentation

## 2015-10-08 DIAGNOSIS — D508 Other iron deficiency anemias: Secondary | ICD-10-CM | POA: Insufficient documentation

## 2015-10-08 DIAGNOSIS — E11319 Type 2 diabetes mellitus with unspecified diabetic retinopathy without macular edema: Secondary | ICD-10-CM | POA: Diagnosis not present

## 2015-10-08 DIAGNOSIS — G4730 Sleep apnea, unspecified: Secondary | ICD-10-CM | POA: Insufficient documentation

## 2015-10-08 DIAGNOSIS — Z86010 Personal history of colonic polyps: Secondary | ICD-10-CM | POA: Insufficient documentation

## 2015-10-08 DIAGNOSIS — Z87440 Personal history of urinary (tract) infections: Secondary | ICD-10-CM | POA: Insufficient documentation

## 2015-10-08 DIAGNOSIS — Z8051 Family history of malignant neoplasm of kidney: Secondary | ICD-10-CM | POA: Insufficient documentation

## 2015-10-08 DIAGNOSIS — K219 Gastro-esophageal reflux disease without esophagitis: Secondary | ICD-10-CM | POA: Diagnosis not present

## 2015-10-08 DIAGNOSIS — D631 Anemia in chronic kidney disease: Secondary | ICD-10-CM | POA: Insufficient documentation

## 2015-10-08 LAB — CBC WITH DIFFERENTIAL/PLATELET
Basophils Absolute: 0.1 10*3/uL (ref 0–0.1)
Basophils Relative: 1 %
EOS ABS: 0.2 10*3/uL (ref 0–0.7)
EOS PCT: 3 %
HCT: 26.8 % — ABNORMAL LOW (ref 35.0–47.0)
HEMOGLOBIN: 8.7 g/dL — AB (ref 12.0–16.0)
LYMPHS ABS: 1.5 10*3/uL (ref 1.0–3.6)
Lymphocytes Relative: 28 %
MCH: 30.2 pg (ref 26.0–34.0)
MCHC: 32.6 g/dL (ref 32.0–36.0)
MCV: 92.5 fL (ref 80.0–100.0)
MONOS PCT: 11 %
Monocytes Absolute: 0.6 10*3/uL (ref 0.2–0.9)
Neutro Abs: 3 10*3/uL (ref 1.4–6.5)
Neutrophils Relative %: 57 %
PLATELETS: 168 10*3/uL (ref 150–440)
RBC: 2.9 MIL/uL — ABNORMAL LOW (ref 3.80–5.20)
RDW: 14.8 % — ABNORMAL HIGH (ref 11.5–14.5)
WBC: 5.3 10*3/uL (ref 3.6–11.0)

## 2015-10-08 LAB — IRON AND TIBC
Iron: 50 ug/dL (ref 28–170)
Saturation Ratios: 13 % (ref 10.4–31.8)
TIBC: 387 ug/dL (ref 250–450)
UIBC: 337 ug/dL

## 2015-10-08 LAB — FERRITIN: FERRITIN: 63 ng/mL (ref 11–307)

## 2015-10-08 NOTE — Progress Notes (Signed)
[facility] OFFICE PROGRESS NOTE  Patient Care Team: Sallee Lange, NP as PCP - General (Internal Medicine)  No matching staging information was found for the patient.  # IRON DEF ANEMIA/ CKD [Dr.Kolluru; Dr.Gittin]   No history exists.     This is my first interaction with the patient as patient's primary oncologist has been Dr.Gittin I reviewed the patient's prior charts/pertinent labs/imaging in detail; findings are summarized above.    INTERVAL HISTORY:  Sara Bright 75 y.o.  female pleasant patient above history of Chronic anemia/chronic kidney disease; is here for follow-up. Patient had been lost to follow-up for approximately a year. She has been referred back by nephrology given the worsening anemia.   Patient complains of significant fatigue. Complains of shortness of breath with exertion. Denies any blood in stools. Denies any black stools. No nausea no vomiting. No discomfort. She had colonoscopy many years ago.   REVIEW OF SYSTEMS:  A complete 10 point review of system is done which is negative except mentioned above/history of present illness.   PAST MEDICAL HISTORY :  Past Medical History:  Diagnosis Date  . Adenomatous colon polyp   . Anemia   . Cataract   . Cervical stenosis of spinal canal   . Chronic kidney disease   . Diabetes mellitus without complication ([place])   . GERD (gastroesophageal reflux disease)   . Glaucoma   . History of UTI   . Hx of gallstones   . Hyperkalemia   . Hyperlipidemia   . Hypertension   . Lumbar stenosis   . Neuropathy associated with endocrine disorder ([place])   . OSA on CPAP   . Osteoarthritis   . Osteoarthritis   . Retinopathy due to secondary diabetes ([place])   . Secondary hyperparathyroidism of renal origin [employer])     PAST SURGICAL HISTORY :   Past Surgical History:  Procedure Laterality Date  . ACDF X2    . BACK SURGERY    . Cataract extraction right    . catarect extraction left    .  CHOLECYSTECTOMY    . colonoscopy with removal lesions by snare    . Endoscoic carpal tunnel release    . Laminectomy posterior lumbar facetectomy and formaninotomy w/Decomp    . Laminectony posterior cervicle decomp w/Facectomy and foraminotomy    . VEIN LIGATION AND STRIPPING Left     FAMILY HISTORY :   Family History  Problem Relation Age of Onset  . Coronary artery disease Mother   . Diabetes type II Mother   . Hypertension Mother   . Tuberculosis Father   . Stroke Father   . Diabetes type II Sister   . Migraines Sister   . Alcohol abuse Brother   . Coronary artery disease Brother   . Diabetes type II Brother   . Kidney cancer Brother   . Kidney disease Brother   . Heart attack Brother   . Hypertension Brother     SOCIAL HISTORY:   Social History  Substance Use Topics  . Smoking status: Not on file  . Smokeless tobacco: Not on file  . Alcohol use Not on file    ALLERGIES:  is allergic to hydrocodone-acetaminophen and statins.  MEDICATIONS:  Current Outpatient Prescriptions  Medication Sig Dispense Refill  . albuterol (PROVENTIL HFA;VENTOLIN HFA) 108 (90 Base) MCG/ACT inhaler Inhale into the lungs.    Marland Kitchen amLODipine (NORVASC) 5 MG tablet Take by mouth.    . calcitRIOL (ROCALTROL) 0.25 MCG capsule     .  furosemide (LASIX) 40 MG tablet     . gabapentin (NEURONTIN) 100 MG capsule     . gemfibrozil (LOPID) 600 MG tablet     . glimepiride (AMARYL) 1 MG tablet     . latanoprost (XALATAN) 0.005 % ophthalmic solution     . losartan (COZAAR) 50 MG tablet Take by mouth.    . Multiple Vitamins-Minerals (CENTRUM SILVER) tablet Take by mouth.    . Omega-3 Fatty Acids (FISH OIL) 1000 MG CAPS Take by mouth.    Marland Kitchen omeprazole (PRILOSEC) 20 MG capsule     . pioglitazone (ACTOS) 30 MG tablet Take by mouth.    . timolol (TIMOPTIC-XR) 0.5 % ophthalmic gel-forming Apply to eye.    . TRADJENTA 5 MG TABS tablet      No current facility-administered medications for this visit.      PHYSICAL EXAMINATION:  BP (!) 147/70 (BP Location: Left Arm, Patient Position: Sitting)   Pulse 84   Temp 98.4 F (36.9 C) (Tympanic)   Resp 18   Wt 263 lb 10.7 oz (119.6 kg)   Filed Weights   10/08/15 1416  Weight: 263 lb 10.7 oz (119.6 kg)    GENERAL: Well-nourished well-developed; Alert, no distress and comfortable. Alone.  EYES: no pallor or icterus OROPHARYNX: no thrush or ulceration; good dentition  NECK: supple, no masses felt LYMPH:  no palpable lymphadenopathy in the cervical, axillary or inguinal regions LUNGS: clear to auscultation and  No wheeze or crackles HEART/CVS: regular rate & rhythm and no murmurs; No lower extremity edema ABDOMEN:abdomen soft, non-tender and normal bowel sounds Musculoskeletal:no cyanosis of digits and no clubbing  PSYCH: alert & oriented x 3 with fluent speech NEURO: no focal motor/sensory deficits SKIN:  no rashes or significant lesions  LABORATORY DATA:  I have reviewed the data as listed    Component Value Date/Time   NA 137 10/23/2012 2335   K 4.4 10/23/2012 2335   CL 107 10/23/2012 2335   CO2 26 10/23/2012 2335   GLUCOSE 179 (H) 10/23/2012 2335   BUN 21 (H) 10/23/2012 2335   CREATININE 1.23 10/23/2012 2335   CALCIUM 10.3 (H) 10/23/2012 2335   PROT 7.4 06/16/2011 1144   ALBUMIN 3.5 06/16/2011 1144   AST 22 08/18/2011 1026   ALT 63 06/16/2011 1144   ALKPHOS 98 06/16/2011 1144   BILITOT 0.4 06/16/2011 1144   GFRNONAA 43 (L) 10/23/2012 2335   GFRAA 50 (L) 10/23/2012 2335    No results found for: SPEP, UPEP  Lab Results  Component Value Date   WBC 5.3 10/08/2015   NEUTROABS 3.0 10/08/2015   HGB 8.7 (L) 10/08/2015   HCT 26.8 (L) 10/08/2015   MCV 92.5 10/08/2015   PLT 168 10/08/2015      Chemistry      Component Value Date/Time   NA 137 10/23/2012 2335   K 4.4 10/23/2012 2335   CL 107 10/23/2012 2335   CO2 26 10/23/2012 2335   BUN 21 (H) 10/23/2012 2335   CREATININE 1.23 10/23/2012 2335      Component  Value Date/Time   CALCIUM 10.3 (H) 10/23/2012 2335   ALKPHOS 98 06/16/2011 1144   AST 22 08/18/2011 1026   ALT 63 06/16/2011 1144   BILITOT 0.4 06/16/2011 1144       RADIOGRAPHIC STUDIES: I have personally reviewed the radiological images as listed and agreed with the findings in the report. No results found.   ASSESSMENT & PLAN:  Other iron deficiency anemias # IDA/ CKD-hemoglobin  today is 8.3. Iron studies are pending/ferritin pending.  Poor tolerance of by mouth iron.  # Recommend IV Venofer weekly 4; recheck CBC on a monthly basis. Recheck iron studies ferritin in 3 months/follow-up with me.  # Colo- 6-7 years/ stool occult 2. Also check a myeloma workup/free light chain ratio/ua- in one month.   No orders of the defined types were placed in this encounter.  All questions were answered. The patient knows to call the clinic with any problems, questions or concerns.      Cammie Sickle, MD 10/11/2015 8:27 AM

## 2015-10-08 NOTE — Assessment & Plan Note (Addendum)
#   IDA/ CKD-hemoglobin today is 8.3. Iron studies are pending/ferritin pending.  Poor tolerance of by mouth iron.  # Recommend IV Venofer weekly 4; recheck CBC on a monthly basis. Recheck iron studies ferritin in 3 months/follow-up with me.  # Colo- 6-7 years/ stool occult 2. Also check a myeloma workup/free light chain ratio/ua- in one month.

## 2015-10-09 DIAGNOSIS — E785 Hyperlipidemia, unspecified: Secondary | ICD-10-CM | POA: Diagnosis not present

## 2015-10-09 DIAGNOSIS — E119 Type 2 diabetes mellitus without complications: Secondary | ICD-10-CM | POA: Diagnosis not present

## 2015-10-09 DIAGNOSIS — M79609 Pain in unspecified limb: Secondary | ICD-10-CM | POA: Diagnosis not present

## 2015-10-09 DIAGNOSIS — M7989 Other specified soft tissue disorders: Secondary | ICD-10-CM | POA: Diagnosis not present

## 2015-10-10 ENCOUNTER — Encounter: Payer: Self-pay | Admitting: Internal Medicine

## 2015-10-15 ENCOUNTER — Inpatient Hospital Stay: Payer: Medicare HMO

## 2015-10-15 VITALS — BP 131/72 | HR 65 | Temp 97.0°F | Resp 18

## 2015-10-15 DIAGNOSIS — I129 Hypertensive chronic kidney disease with stage 1 through stage 4 chronic kidney disease, or unspecified chronic kidney disease: Secondary | ICD-10-CM | POA: Diagnosis not present

## 2015-10-15 DIAGNOSIS — D508 Other iron deficiency anemias: Secondary | ICD-10-CM

## 2015-10-15 MED ORDER — SODIUM CHLORIDE 0.9 % IV SOLN
200.0000 mg | Freq: Once | INTRAVENOUS | Status: AC
  Administered 2015-10-15: 200 mg via INTRAVENOUS
  Filled 2015-10-15: qty 10

## 2015-10-15 MED ORDER — SODIUM CHLORIDE 0.9 % IV SOLN
Freq: Once | INTRAVENOUS | Status: AC
  Administered 2015-10-15: 09:00:00 via INTRAVENOUS
  Filled 2015-10-15: qty 1000

## 2015-10-15 NOTE — Patient Instructions (Signed)
Iron Sucrose injection  What is this medicine?  IRON SUCROSE (AHY ern SOO krohs) is an iron complex. Iron is used to make healthy red blood cells, which carry oxygen and nutrients throughout the body. This medicine is used to treat iron deficiency anemia in people with chronic kidney disease.  This medicine may be used for other purposes; ask your health care provider or pharmacist if you have questions.  What should I tell my health care provider before I take this medicine?  They need to know if you have any of these conditions:  -anemia not caused by low iron levels  -heart disease  -high levels of iron in the blood  -kidney disease  -liver disease  -an unusual or allergic reaction to iron, other medicines, foods, dyes, or preservatives  -pregnant or trying to get pregnant  -breast-feeding  How should I use this medicine?  This medicine is for infusion into a vein. It is given by a health care professional in a hospital or clinic setting.  Talk to your pediatrician regarding the use of this medicine in children. While this drug may be prescribed for children as young as 2 years for selected conditions, precautions do apply.  Overdosage: If you think you have taken too much of this medicine contact a poison control center or emergency room at once.  NOTE: This medicine is only for you. Do not share this medicine with others.  What if I miss a dose?  It is important not to miss your dose. Call your doctor or health care professional if you are unable to keep an appointment.  What may interact with this medicine?  Do not take this medicine with any of the following medications:  -deferoxamine  -dimercaprol  -other iron products  This medicine may also interact with the following medications:  -chloramphenicol  -deferasirox  This list may not describe all possible interactions. Give your health care provider a list of all the medicines, herbs, non-prescription drugs, or dietary supplements you use. Also tell them if  you smoke, drink alcohol, or use illegal drugs. Some items may interact with your medicine.  What should I watch for while using this medicine?  Visit your doctor or healthcare professional regularly. Tell your doctor or healthcare professional if your symptoms do not start to get better or if they get worse. You may need blood work done while you are taking this medicine.  You may need to follow a special diet. Talk to your doctor. Foods that contain iron include: whole grains/cereals, dried fruits, beans, or peas, leafy green vegetables, and organ meats (liver, kidney).  What side effects may I notice from receiving this medicine?  Side effects that you should report to your doctor or health care professional as soon as possible:  -allergic reactions like skin rash, itching or hives, swelling of the face, lips, or tongue  -breathing problems  -changes in blood pressure  -cough  -fast, irregular heartbeat  -feeling faint or lightheaded, falls  -fever or chills  -flushing, sweating, or hot feelings  -joint or muscle aches/pains  -seizures  -swelling of the ankles or feet  -unusually weak or tired  Side effects that usually do not require medical attention (report to your doctor or health care professional if they continue or are bothersome):  -diarrhea  -feeling achy  -headache  -irritation at site where injected  -nausea, vomiting  -stomach upset  -tiredness  This list may not describe all possible side effects. Call your doctor   for medical advice about side effects. You may report side effects to FDA at 1-800-FDA-1088.  Where should I keep my medicine?  This drug is given in a hospital or clinic and will not be stored at home.  NOTE: This sheet is a summary. It may not cover all possible information. If you have questions about this medicine, talk to your doctor, pharmacist, or health care provider.      2016, [employer]. (2010-12-04 17:14:35)

## 2015-10-16 DIAGNOSIS — E119 Type 2 diabetes mellitus without complications: Secondary | ICD-10-CM | POA: Diagnosis not present

## 2015-10-16 DIAGNOSIS — L97921 Non-pressure chronic ulcer of unspecified part of left lower leg limited to breakdown of skin: Secondary | ICD-10-CM | POA: Diagnosis not present

## 2015-10-16 DIAGNOSIS — Z9989 Dependence on other enabling machines and devices: Secondary | ICD-10-CM | POA: Diagnosis not present

## 2015-10-16 DIAGNOSIS — M79609 Pain in unspecified limb: Secondary | ICD-10-CM | POA: Diagnosis not present

## 2015-10-16 DIAGNOSIS — E785 Hyperlipidemia, unspecified: Secondary | ICD-10-CM | POA: Diagnosis not present

## 2015-10-16 DIAGNOSIS — M7989 Other specified soft tissue disorders: Secondary | ICD-10-CM | POA: Diagnosis not present

## 2015-10-16 DIAGNOSIS — J45909 Unspecified asthma, uncomplicated: Secondary | ICD-10-CM | POA: Diagnosis not present

## 2015-10-16 DIAGNOSIS — R0609 Other forms of dyspnea: Secondary | ICD-10-CM | POA: Diagnosis not present

## 2015-10-16 DIAGNOSIS — G4733 Obstructive sleep apnea (adult) (pediatric): Secondary | ICD-10-CM | POA: Diagnosis not present

## 2015-10-21 DIAGNOSIS — G4733 Obstructive sleep apnea (adult) (pediatric): Secondary | ICD-10-CM | POA: Diagnosis not present

## 2015-10-22 ENCOUNTER — Inpatient Hospital Stay: Payer: Medicare HMO

## 2015-10-22 DIAGNOSIS — I129 Hypertensive chronic kidney disease with stage 1 through stage 4 chronic kidney disease, or unspecified chronic kidney disease: Secondary | ICD-10-CM | POA: Diagnosis not present

## 2015-10-22 DIAGNOSIS — D508 Other iron deficiency anemias: Secondary | ICD-10-CM

## 2015-10-22 MED ORDER — SODIUM CHLORIDE 0.9 % IV SOLN
Freq: Once | INTRAVENOUS | Status: AC
  Administered 2015-10-22: 09:00:00 via INTRAVENOUS
  Filled 2015-10-22: qty 1000

## 2015-10-22 MED ORDER — SODIUM CHLORIDE 0.9 % IV SOLN
200.0000 mg | Freq: Once | INTRAVENOUS | Status: AC
  Administered 2015-10-22: 200 mg via INTRAVENOUS
  Filled 2015-10-22: qty 10

## 2015-10-23 DIAGNOSIS — E785 Hyperlipidemia, unspecified: Secondary | ICD-10-CM | POA: Diagnosis not present

## 2015-10-23 DIAGNOSIS — L97921 Non-pressure chronic ulcer of unspecified part of left lower leg limited to breakdown of skin: Secondary | ICD-10-CM | POA: Diagnosis not present

## 2015-10-23 DIAGNOSIS — M79609 Pain in unspecified limb: Secondary | ICD-10-CM | POA: Diagnosis not present

## 2015-10-23 DIAGNOSIS — M7989 Other specified soft tissue disorders: Secondary | ICD-10-CM | POA: Diagnosis not present

## 2015-10-23 DIAGNOSIS — E119 Type 2 diabetes mellitus without complications: Secondary | ICD-10-CM | POA: Diagnosis not present

## 2015-10-24 DIAGNOSIS — G4733 Obstructive sleep apnea (adult) (pediatric): Secondary | ICD-10-CM | POA: Diagnosis not present

## 2015-10-28 DIAGNOSIS — H401111 Primary open-angle glaucoma, right eye, mild stage: Secondary | ICD-10-CM | POA: Diagnosis not present

## 2015-10-29 ENCOUNTER — Inpatient Hospital Stay: Payer: Medicare HMO

## 2015-10-29 VITALS — BP 140/70 | HR 72 | Temp 97.6°F | Resp 18

## 2015-10-29 DIAGNOSIS — I129 Hypertensive chronic kidney disease with stage 1 through stage 4 chronic kidney disease, or unspecified chronic kidney disease: Secondary | ICD-10-CM | POA: Diagnosis not present

## 2015-10-29 DIAGNOSIS — D508 Other iron deficiency anemias: Secondary | ICD-10-CM

## 2015-10-29 MED ORDER — SODIUM CHLORIDE 0.9 % IV SOLN
200.0000 mg | Freq: Once | INTRAVENOUS | Status: AC
  Administered 2015-10-29: 200 mg via INTRAVENOUS
  Filled 2015-10-29: qty 10

## 2015-10-29 MED ORDER — SODIUM CHLORIDE 0.9 % IV SOLN
Freq: Once | INTRAVENOUS | Status: AC
  Administered 2015-10-29: 20 mL/h via INTRAVENOUS
  Filled 2015-10-29: qty 1000

## 2015-10-30 DIAGNOSIS — M79609 Pain in unspecified limb: Secondary | ICD-10-CM | POA: Diagnosis not present

## 2015-10-30 DIAGNOSIS — E119 Type 2 diabetes mellitus without complications: Secondary | ICD-10-CM | POA: Diagnosis not present

## 2015-10-30 DIAGNOSIS — E785 Hyperlipidemia, unspecified: Secondary | ICD-10-CM | POA: Diagnosis not present

## 2015-10-30 DIAGNOSIS — I890 Lymphedema, not elsewhere classified: Secondary | ICD-10-CM | POA: Diagnosis not present

## 2015-10-30 DIAGNOSIS — M7989 Other specified soft tissue disorders: Secondary | ICD-10-CM | POA: Diagnosis not present

## 2015-10-30 DIAGNOSIS — L97921 Non-pressure chronic ulcer of unspecified part of left lower leg limited to breakdown of skin: Secondary | ICD-10-CM | POA: Diagnosis not present

## 2015-10-30 DIAGNOSIS — I872 Venous insufficiency (chronic) (peripheral): Secondary | ICD-10-CM | POA: Diagnosis not present

## 2015-11-04 DIAGNOSIS — H401111 Primary open-angle glaucoma, right eye, mild stage: Secondary | ICD-10-CM | POA: Diagnosis not present

## 2015-11-05 ENCOUNTER — Inpatient Hospital Stay: Payer: Medicare HMO

## 2015-11-05 VITALS — BP 136/78 | HR 71 | Temp 96.2°F | Resp 20

## 2015-11-05 DIAGNOSIS — D508 Other iron deficiency anemias: Secondary | ICD-10-CM

## 2015-11-05 DIAGNOSIS — I129 Hypertensive chronic kidney disease with stage 1 through stage 4 chronic kidney disease, or unspecified chronic kidney disease: Secondary | ICD-10-CM | POA: Diagnosis not present

## 2015-11-05 MED ORDER — SODIUM CHLORIDE 0.9 % IV SOLN
200.0000 mg | Freq: Once | INTRAVENOUS | Status: AC
  Administered 2015-11-05: 200 mg via INTRAVENOUS
  Filled 2015-11-05: qty 10

## 2015-11-05 MED ORDER — SODIUM CHLORIDE 0.9 % IV SOLN
Freq: Once | INTRAVENOUS | Status: AC
  Administered 2015-11-05: 10:00:00 via INTRAVENOUS
  Filled 2015-11-05: qty 1000

## 2015-11-12 ENCOUNTER — Inpatient Hospital Stay: Payer: Medicare HMO | Attending: Internal Medicine

## 2015-11-12 DIAGNOSIS — D509 Iron deficiency anemia, unspecified: Secondary | ICD-10-CM

## 2015-11-12 DIAGNOSIS — N189 Chronic kidney disease, unspecified: Secondary | ICD-10-CM | POA: Insufficient documentation

## 2015-11-12 DIAGNOSIS — I129 Hypertensive chronic kidney disease with stage 1 through stage 4 chronic kidney disease, or unspecified chronic kidney disease: Secondary | ICD-10-CM | POA: Insufficient documentation

## 2015-11-12 LAB — URINALYSIS COMPLETE WITH MICROSCOPIC (ARMC ONLY)
BACTERIA UA: NONE SEEN
BILIRUBIN URINE: NEGATIVE
Glucose, UA: NEGATIVE mg/dL
HGB URINE DIPSTICK: NEGATIVE
Ketones, ur: NEGATIVE mg/dL
LEUKOCYTES UA: NEGATIVE
NITRITE: NEGATIVE
PH: 5.5 (ref 5.0–8.0)
Protein, ur: NEGATIVE mg/dL
RBC / HPF: NONE SEEN RBC/hpf (ref 0–5)
SPECIFIC GRAVITY, URINE: 1.01 (ref 1.005–1.030)

## 2015-11-12 LAB — COMPREHENSIVE METABOLIC PANEL
ALT: 17 U/L (ref 14–54)
AST: 23 U/L (ref 15–41)
Albumin: 3.6 g/dL (ref 3.5–5.0)
Alkaline Phosphatase: 82 U/L (ref 38–126)
Anion gap: 7 (ref 5–15)
BUN: 54 mg/dL — ABNORMAL HIGH (ref 6–20)
CHLORIDE: 108 mmol/L (ref 101–111)
CO2: 25 mmol/L (ref 22–32)
CREATININE: 1.77 mg/dL — AB (ref 0.44–1.00)
Calcium: 9.7 mg/dL (ref 8.9–10.3)
GFR calc non Af Amer: 27 mL/min — ABNORMAL LOW (ref >60)
GFR, EST AFRICAN AMERICAN: 31 mL/min — AB (ref >60)
Glucose, Bld: 136 mg/dL — ABNORMAL HIGH (ref 65–99)
Potassium: 4.7 mmol/L (ref 3.5–5.1)
SODIUM: 140 mmol/L (ref 135–145)
Total Bilirubin: 0.5 mg/dL (ref 0.3–1.2)
Total Protein: 7.2 g/dL (ref 6.5–8.1)

## 2015-11-12 LAB — CBC WITH DIFFERENTIAL/PLATELET
BASOS ABS: 0.1 10*3/uL (ref 0–0.1)
BASOS PCT: 1 %
EOS ABS: 0.2 10*3/uL (ref 0–0.7)
Eosinophils Relative: 5 %
HEMATOCRIT: 29.3 % — AB (ref 35.0–47.0)
HEMOGLOBIN: 9.5 g/dL — AB (ref 12.0–16.0)
Lymphocytes Relative: 25 %
Lymphs Abs: 1.2 10*3/uL (ref 1.0–3.6)
MCH: 30.3 pg (ref 26.0–34.0)
MCHC: 32.4 g/dL (ref 32.0–36.0)
MCV: 93.4 fL (ref 80.0–100.0)
Monocytes Absolute: 0.5 10*3/uL (ref 0.2–0.9)
Monocytes Relative: 11 %
NEUTROS ABS: 2.8 10*3/uL (ref 1.4–6.5)
NEUTROS PCT: 58 %
PLATELETS: 166 10*3/uL (ref 150–440)
RBC: 3.13 MIL/uL — ABNORMAL LOW (ref 3.80–5.20)
RDW: 15.5 % — ABNORMAL HIGH (ref 11.5–14.5)
WBC: 4.8 10*3/uL (ref 3.6–11.0)

## 2015-11-13 LAB — KAPPA/LAMBDA LIGHT CHAINS
KAPPA FREE LGHT CHN: 38.1 mg/L — AB (ref 3.3–19.4)
Kappa, lambda light chain ratio: 1.33 (ref 0.26–1.65)
LAMDA FREE LIGHT CHAINS: 28.7 mg/L — AB (ref 5.7–26.3)

## 2015-11-14 ENCOUNTER — Ambulatory Visit: Payer: Medicare HMO

## 2015-11-14 DIAGNOSIS — I129 Hypertensive chronic kidney disease with stage 1 through stage 4 chronic kidney disease, or unspecified chronic kidney disease: Secondary | ICD-10-CM | POA: Diagnosis not present

## 2015-11-14 DIAGNOSIS — N189 Chronic kidney disease, unspecified: Secondary | ICD-10-CM | POA: Diagnosis not present

## 2015-11-14 DIAGNOSIS — D509 Iron deficiency anemia, unspecified: Secondary | ICD-10-CM | POA: Diagnosis not present

## 2015-11-14 LAB — OCCULT BLOOD X 1 CARD TO LAB, STOOL: FECAL OCCULT BLD: NEGATIVE

## 2015-11-18 LAB — MULTIPLE MYELOMA PANEL, SERUM
ALBUMIN SERPL ELPH-MCNC: 3.6 g/dL (ref 2.9–4.4)
Albumin/Glob SerPl: 1.3 (ref 0.7–1.7)
Alpha 1: 0.2 g/dL (ref 0.0–0.4)
Alpha2 Glob SerPl Elph-Mcnc: 0.7 g/dL (ref 0.4–1.0)
B-Globulin SerPl Elph-Mcnc: 1 g/dL (ref 0.7–1.3)
Gamma Glob SerPl Elph-Mcnc: 1 g/dL (ref 0.4–1.8)
Globulin, Total: 3 g/dL (ref 2.2–3.9)
IGA: 199 mg/dL (ref 64–422)
IgG (Immunoglobin G), Serum: 1052 mg/dL (ref 700–1600)
IgM, Serum: 108 mg/dL (ref 26–217)
TOTAL PROTEIN ELP: 6.6 g/dL (ref 6.0–8.5)

## 2015-11-21 DIAGNOSIS — E1122 Type 2 diabetes mellitus with diabetic chronic kidney disease: Secondary | ICD-10-CM | POA: Diagnosis not present

## 2015-11-21 DIAGNOSIS — M545 Low back pain: Secondary | ICD-10-CM | POA: Diagnosis not present

## 2015-11-21 DIAGNOSIS — I10 Essential (primary) hypertension: Secondary | ICD-10-CM | POA: Diagnosis not present

## 2015-11-21 DIAGNOSIS — D638 Anemia in other chronic diseases classified elsewhere: Secondary | ICD-10-CM | POA: Diagnosis not present

## 2015-11-21 DIAGNOSIS — G4733 Obstructive sleep apnea (adult) (pediatric): Secondary | ICD-10-CM | POA: Diagnosis not present

## 2015-11-21 DIAGNOSIS — N183 Chronic kidney disease, stage 3 (moderate): Secondary | ICD-10-CM | POA: Diagnosis not present

## 2015-11-21 DIAGNOSIS — Z79899 Other long term (current) drug therapy: Secondary | ICD-10-CM | POA: Diagnosis not present

## 2015-11-21 DIAGNOSIS — I999 Unspecified disorder of circulatory system: Secondary | ICD-10-CM | POA: Diagnosis not present

## 2015-11-21 DIAGNOSIS — R600 Localized edema: Secondary | ICD-10-CM | POA: Diagnosis not present

## 2015-11-21 DIAGNOSIS — M5136 Other intervertebral disc degeneration, lumbar region: Secondary | ICD-10-CM | POA: Diagnosis not present

## 2015-11-25 DIAGNOSIS — G4733 Obstructive sleep apnea (adult) (pediatric): Secondary | ICD-10-CM | POA: Diagnosis not present

## 2015-11-27 ENCOUNTER — Encounter (INDEPENDENT_AMBULATORY_CARE_PROVIDER_SITE_OTHER): Payer: Self-pay

## 2015-11-27 DIAGNOSIS — I8390 Asymptomatic varicose veins of unspecified lower extremity: Secondary | ICD-10-CM

## 2015-12-10 ENCOUNTER — Inpatient Hospital Stay: Payer: Medicare HMO | Attending: Internal Medicine

## 2015-12-10 DIAGNOSIS — D509 Iron deficiency anemia, unspecified: Secondary | ICD-10-CM | POA: Insufficient documentation

## 2015-12-10 DIAGNOSIS — Z23 Encounter for immunization: Secondary | ICD-10-CM | POA: Diagnosis not present

## 2015-12-10 LAB — CBC WITH DIFFERENTIAL/PLATELET
Basophils Absolute: 0.1 10*3/uL (ref 0–0.1)
Basophils Relative: 1 %
EOS PCT: 3 %
Eosinophils Absolute: 0.2 10*3/uL (ref 0–0.7)
HEMATOCRIT: 30.2 % — AB (ref 35.0–47.0)
Hemoglobin: 9.8 g/dL — ABNORMAL LOW (ref 12.0–16.0)
LYMPHS ABS: 1.5 10*3/uL (ref 1.0–3.6)
LYMPHS PCT: 28 %
MCH: 30.3 pg (ref 26.0–34.0)
MCHC: 32.4 g/dL (ref 32.0–36.0)
MCV: 93.4 fL (ref 80.0–100.0)
Monocytes Absolute: 0.6 10*3/uL (ref 0.2–0.9)
Monocytes Relative: 12 %
NEUTROS ABS: 3 10*3/uL (ref 1.4–6.5)
Neutrophils Relative %: 56 %
PLATELETS: 185 10*3/uL (ref 150–440)
RBC: 3.24 MIL/uL — AB (ref 3.80–5.20)
RDW: 15 % — ABNORMAL HIGH (ref 11.5–14.5)
WBC: 5.4 10*3/uL (ref 3.6–11.0)

## 2015-12-21 DIAGNOSIS — G4733 Obstructive sleep apnea (adult) (pediatric): Secondary | ICD-10-CM | POA: Diagnosis not present

## 2015-12-26 DIAGNOSIS — G4733 Obstructive sleep apnea (adult) (pediatric): Secondary | ICD-10-CM | POA: Diagnosis not present

## 2015-12-31 ENCOUNTER — Encounter (INDEPENDENT_AMBULATORY_CARE_PROVIDER_SITE_OTHER): Payer: Self-pay | Admitting: Vascular Surgery

## 2015-12-31 ENCOUNTER — Ambulatory Visit (INDEPENDENT_AMBULATORY_CARE_PROVIDER_SITE_OTHER): Payer: Medicare HMO | Admitting: Vascular Surgery

## 2015-12-31 VITALS — BP 147/69 | HR 81 | Resp 16 | Ht 64.0 in | Wt 260.0 lb

## 2015-12-31 DIAGNOSIS — N183 Chronic kidney disease, stage 3 unspecified: Secondary | ICD-10-CM

## 2015-12-31 DIAGNOSIS — M7989 Other specified soft tissue disorders: Secondary | ICD-10-CM | POA: Insufficient documentation

## 2015-12-31 DIAGNOSIS — I83893 Varicose veins of bilateral lower extremities with other complications: Secondary | ICD-10-CM

## 2015-12-31 NOTE — Assessment & Plan Note (Signed)
Under better control with compression stockings and elevation. Continue these. Venous intervention as described below.

## 2015-12-31 NOTE — Assessment & Plan Note (Signed)
Likely contributes to leg swelling and fluid retention.

## 2015-12-31 NOTE — Assessment & Plan Note (Signed)
Previously performed venous duplex shows reflux in the left great saphenous vein. Given her history of ulceration on this side and the persistent swelling, I would strongly recommend laser ablation of the left great saphenous vein. Risks and benefits were discussed in detail today. Patient is agreeable to proceed.

## 2015-12-31 NOTE — Assessment & Plan Note (Signed)
Contributes to lower extremity swelling. Weight loss and exercise of benefit.

## 2015-12-31 NOTE — Progress Notes (Signed)
MRN : [id number]  Sara Bright is a 75 y.o. (1940-06-18) female who presents with chief complaint of  Chief Complaint  Patient presents with  . Follow-up  .  History of Present Illness: Patient returns today in follow up of Her leg swelling and venous insufficiency. Her left lower extremity ulceration has not recurred since her last visit. She has been wearing her compression stockings and elevating her legs on a daily basis. Her pain is improved as well. She does still have significant swelling bilaterally, but it is better than her initial visit. A previously performed venous duplex showed left great saphenous vein reflux.  Current Outpatient Prescriptions  Medication Sig Dispense Refill  . albuterol (PROVENTIL HFA;VENTOLIN HFA) 108 (90 Base) MCG/ACT inhaler Inhale into the lungs.    Marland Kitchen amLODipine (NORVASC) 5 MG tablet Take by mouth.    . calcitRIOL (ROCALTROL) 0.25 MCG capsule     . furosemide (LASIX) 40 MG tablet     . gabapentin (NEURONTIN) 100 MG capsule     . gemfibrozil (LOPID) 600 MG tablet     . glimepiride (AMARYL) 1 MG tablet     . latanoprost (XALATAN) 0.005 % ophthalmic solution     . losartan (COZAAR) 50 MG tablet Take by mouth.    . Multiple Vitamins-Minerals (CENTRUM SILVER) tablet Take by mouth.    . Omega-3 Fatty Acids (FISH OIL) 1000 MG CAPS Take by mouth.    Marland Kitchen omeprazole (PRILOSEC) 20 MG capsule     . pioglitazone (ACTOS) 30 MG tablet Take by mouth.    . timolol (TIMOPTIC-XR) 0.5 % ophthalmic gel-forming Apply to eye.    . TRADJENTA 5 MG TABS tablet      No current facility-administered medications for this visit.     Past Medical History:  Diagnosis Date  . Adenomatous colon polyp   . Anemia   . Cataract   . Cervical stenosis of spinal canal   . Chronic kidney disease   . Diabetes mellitus without complication ([place])   . GERD (gastroesophageal reflux disease)   . Glaucoma   . History of UTI   . Hx of gallstones   . Hyperkalemia   . Hyperlipidemia    . Hypertension   . Lumbar stenosis   . Neuropathy associated with endocrine disorder ([place])   . OSA on CPAP   . Osteoarthritis   . Osteoarthritis   . Retinopathy due to secondary diabetes ([place])   . Secondary hyperparathyroidism of renal origin [facility])     Past Surgical History:  Procedure Laterality Date  . ACDF X2    . BACK SURGERY    . Cataract extraction right    . catarect extraction left    . CHOLECYSTECTOMY    . colonoscopy with removal lesions by snare    . Endoscoic carpal tunnel release    . Laminectomy posterior lumbar facetectomy and formaninotomy w/Decomp    . Laminectony posterior cervicle decomp w/Facectomy and foraminotomy    . VEIN LIGATION AND STRIPPING Left     Social History Social History  Substance Use Topics  . Smoking status: Never Smoker  . Smokeless tobacco: Never Used  . Alcohol use No     Family History Family History  Problem Relation Age of Onset  . Coronary artery disease Mother   . Diabetes type II Mother   . Hypertension Mother   . Tuberculosis Father   . Stroke Father   . Diabetes type II Sister   . Migraines  Sister   . Alcohol abuse Brother   . Coronary artery disease Brother   . Diabetes type II Brother   . Kidney cancer Brother   . Kidney disease Brother   . Heart attack Brother   . Hypertension Brother      Allergies  Allergen Reactions  . Hydrocodone-Acetaminophen Other (See Comments)    Sedation and GI upset, pt is not allergic to acetaminophen  . Statins Other (See Comments)     REVIEW OF SYSTEMS (Negative unless checked)  Constitutional: '[]' Weight loss  '[]' Fever  '[]' Chills Cardiac: '[]' Chest pain   '[]' Chest pressure   '[]' Palpitations   '[]' Shortness of breath when laying flat   '[]' Shortness of breath at rest   '[]' Shortness of breath with exertion. Vascular:  '[]' Pain in legs with walking   '[]' Pain in legs at rest   '[]' Pain in legs when laying flat   '[]' Claudication   '[]' Pain in feet when walking  '[]' Pain in feet at rest  '[]' Pain  in feet when laying flat   '[]' History of DVT   '[]' Phlebitis   '[x]' Swelling in legs   '[x]' Varicose veins   '[]' Non-healing ulcers Pulmonary:   '[]' Uses home oxygen   '[]' Productive cough   '[]' Hemoptysis   '[]' Wheeze  '[]' COPD   '[]' Asthma Neurologic:  '[]' Dizziness  '[]' Blackouts   '[]' Seizures   '[]' History of stroke   '[]' History of TIA  '[]' Aphasia   '[]' Temporary blindness   '[]' Dysphagia   '[]' Weakness or numbness in arms   '[]' Weakness or numbness in legs Musculoskeletal:  '[]' Arthritis   '[]' Joint swelling   '[]' Joint pain   '[]' Low back pain Hematologic:  '[]' Easy bruising  '[]' Easy bleeding   '[]' Hypercoagulable state   '[]' Anemic   Gastrointestinal:  '[]' Blood in stool   '[]' Vomiting blood  '[]' Gastroesophageal reflux/heartburn   '[]' Abdominal pain Genitourinary:  '[]' Chronic kidney disease   '[]' Difficult urination  '[]' Frequent urination  '[]' Burning with urination   '[]' Hematuria Skin:  '[]' Rashes   '[]' Ulcers   '[]' Wounds Psychological:  '[]' History of anxiety   '[]'  History of major depression.  Physical Examination  BP (!) 147/69   Pulse 81   Resp 16   Ht '5\' 4"'  (1.626 m)   Wt 260 lb (117.9 kg)   BMI 44.63 kg/m  Gen:  WD/WN, NAD Head: [place]/AT, No temporalis wasting. Ear/Nose/Throat: Hearing grossly intact, nares w/o erythema or drainage, trachea midline Eyes: Conjunctiva clear. Sclera non-icteric Neck: Supple.  No JVD.  Pulmonary:  Good air movement, no use of accessory muscles.  Cardiac: RRR, normal S1, S2 Vascular:  Vessel Right Left  Radial Palpable Palpable  Ulnar Palpable Palpable  Brachial Palpable Palpable  Carotid Palpable, without bruit Palpable, without bruit  Aorta Not palpable N/A  Femoral Palpable Palpable  Popliteal Palpable Palpable  PT Not Palpable Not Palpable  DP Palpable Palpable   Gastrointestinal: soft, non-tender/non-distended. No guarding/reflex.  Musculoskeletal: M/S 5/5 throughout.  No deformity or atrophy. 2-3+ right lower extremity edema and 2+ left lower extremity edema. Neurologic: Sensation grossly intact in  extremities.  Symmetrical.  Speech is fluent.  Psychiatric: Judgment intact, Mood & affect appropriate for pt's clinical situation. Dermatologic: No rashes or ulcers noted.  No cellulitis or open wounds. Previous ulceration on left lateral calf has healed Lymph : No Cervical, Axillary, or Inguinal lymphadenopathy.      Labs Recent Results (from the past 2160 hour(s))  CBC with Differential     Status: Abnormal   Collection Time: 10/08/15  1:47 PM  Result Value Ref Range   WBC 5.3 3.6 - 11.0 K/uL  RBC 2.90 (L) 3.80 - 5.20 MIL/uL   Hemoglobin 8.7 (L) 12.0 - 16.0 g/dL   HCT 26.8 (L) 35.0 - 47.0 %   MCV 92.5 80.0 - 100.0 fL   MCH 30.2 26.0 - 34.0 pg   MCHC 32.6 32.0 - 36.0 g/dL   RDW 14.8 (H) 11.5 - 14.5 %   Platelets 168 150 - 440 K/uL   Neutrophils Relative % 57 %   Neutro Abs 3.0 1.4 - 6.5 K/uL   Lymphocytes Relative 28 %   Lymphs Abs 1.5 1.0 - 3.6 K/uL   Monocytes Relative 11 %   Monocytes Absolute 0.6 0.2 - 0.9 K/uL   Eosinophils Relative 3 %   Eosinophils Absolute 0.2 0 - 0.7 K/uL   Basophils Relative 1 %   Basophils Absolute 0.1 0 - 0.1 K/uL  Ferritin     Status: None   Collection Time: 10/08/15  1:47 PM  Result Value Ref Range   Ferritin 63 11 - 307 ng/mL  Iron and TIBC     Status: None   Collection Time: 10/08/15  1:47 PM  Result Value Ref Range   Iron 50 28 - 170 ug/dL   TIBC 387 250 - 450 ug/dL   Saturation Ratios 13 10.4 - 31.8 %   UIBC 337 ug/dL  CBC with Differential     Status: Abnormal   Collection Time: 11/12/15  9:19 AM  Result Value Ref Range   WBC 4.8 3.6 - 11.0 K/uL   RBC 3.13 (L) 3.80 - 5.20 MIL/uL   Hemoglobin 9.5 (L) 12.0 - 16.0 g/dL   HCT 29.3 (L) 35.0 - 47.0 %   MCV 93.4 80.0 - 100.0 fL   MCH 30.3 26.0 - 34.0 pg   MCHC 32.4 32.0 - 36.0 g/dL   RDW 15.5 (H) 11.5 - 14.5 %   Platelets 166 150 - 440 K/uL   Neutrophils Relative % 58 %   Neutro Abs 2.8 1.4 - 6.5 K/uL   Lymphocytes Relative 25 %   Lymphs Abs 1.2 1.0 - 3.6 K/uL   Monocytes  Relative 11 %   Monocytes Absolute 0.5 0.2 - 0.9 K/uL   Eosinophils Relative 5 %   Eosinophils Absolute 0.2 0 - 0.7 K/uL   Basophils Relative 1 %   Basophils Absolute 0.1 0 - 0.1 K/uL  Comprehensive metabolic panel     Status: Abnormal   Collection Time: 11/12/15  9:19 AM  Result Value Ref Range   Sodium 140 135 - 145 mmol/L   Potassium 4.7 3.5 - 5.1 mmol/L   Chloride 108 101 - 111 mmol/L   CO2 25 22 - 32 mmol/L   Glucose, Bld 136 (H) 65 - 99 mg/dL   BUN 54 (H) 6 - 20 mg/dL   Creatinine, Ser 1.77 (H) 0.44 - 1.00 mg/dL   Calcium 9.7 8.9 - 10.3 mg/dL   Total Protein 7.2 6.5 - 8.1 g/dL   Albumin 3.6 3.5 - 5.0 g/dL   AST 23 15 - 41 U/L   ALT 17 14 - 54 U/L   Alkaline Phosphatase 82 38 - 126 U/L   Total Bilirubin 0.5 0.3 - 1.2 mg/dL   GFR calc non Af Amer 27 (L) >60 mL/min   GFR calc Af Amer 31 (L) >60 mL/min    Comment: (NOTE) The eGFR has been calculated using the CKD EPI equation. This calculation has not been validated in all clinical situations. eGFR's persistently <60 mL/min signify possible Chronic Kidney Disease.  Anion gap 7 5 - 15  Kappa/lambda light chains     Status: Abnormal   Collection Time: 11/12/15  9:19 AM  Result Value Ref Range   Kappa free light chain 38.1 (H) 3.3 - 19.4 mg/L   Lamda free light chains 28.7 (H) 5.7 - 26.3 mg/L   Kappa, lamda light chain ratio 1.33 0.26 - 1.65    Comment: (NOTE) Performed At: [facility] [place], [place] [id number] Lindon Romp MD YB:[id number]   Multiple Myeloma Panel (SPEP&IFE w/QIG)     Status: None   Collection Time: 11/12/15  9:20 AM  Result Value Ref Range   IgG (Immunoglobin G), Serum 1,052 700 - 1,600 mg/dL   IgA 199 64 - 422 mg/dL   IgM, Serum 108 26 - 217 mg/dL   Total Protein ELP 6.6 6.0 - 8.5 g/dL   Albumin SerPl Elph-Mcnc 3.6 2.9 - 4.4 g/dL   Alpha 1 0.2 0.0 - 0.4 g/dL   Alpha2 Glob SerPl Elph-Mcnc 0.7 0.4 - 1.0 g/dL   B-Globulin SerPl Elph-Mcnc 1.0 0.7 - 1.3 g/dL   Gamma  Glob SerPl Elph-Mcnc 1.0 0.4 - 1.8 g/dL   M Protein SerPl Elph-Mcnc Not Observed Not Observed g/dL   Globulin, Total 3.0 2.2 - 3.9 g/dL   Albumin/Glob SerPl 1.3 0.7 - 1.7   IFE 1 Comment     Comment: An apparent normal immunofixation pattern.   Please Note Comment     Comment: (NOTE) Protein electrophoresis scan will follow via computer, mail, or courier delivery. Performed At: [facility] [place], [place] [id number] Lindon Romp MD HF:[id number]   Urinalysis complete, with microscopic [facility])     Status: Abnormal   Collection Time: 11/12/15  9:50 AM  Result Value Ref Range   Color, Urine YELLOW YELLOW   APPearance CLEAR CLEAR   Glucose, UA NEGATIVE NEGATIVE mg/dL   Bilirubin Urine NEGATIVE NEGATIVE   Ketones, ur NEGATIVE NEGATIVE mg/dL   Specific Gravity, Urine 1.010 1.005 - 1.030   Hgb urine dipstick NEGATIVE NEGATIVE   pH 5.5 5.0 - 8.0   Protein, ur NEGATIVE NEGATIVE mg/dL   Nitrite NEGATIVE NEGATIVE   Leukocytes, UA NEGATIVE NEGATIVE   RBC / HPF NONE SEEN 0 - 5 RBC/hpf   WBC, UA 0-5 0 - 5 WBC/hpf   Bacteria, UA NONE SEEN NONE SEEN   Squamous Epithelial / LPF 6-30 (A) NONE SEEN  Occult blood card to lab, stool     Status: None   Collection Time: 11/14/15 12:37 PM  Result Value Ref Range   Fecal Occult Bld NEGATIVE NEGATIVE  CBC with Differential     Status: Abnormal   Collection Time: 12/10/15  9:21 AM  Result Value Ref Range   WBC 5.4 3.6 - 11.0 K/uL   RBC 3.24 (L) 3.80 - 5.20 MIL/uL   Hemoglobin 9.8 (L) 12.0 - 16.0 g/dL   HCT 30.2 (L) 35.0 - 47.0 %   MCV 93.4 80.0 - 100.0 fL   MCH 30.3 26.0 - 34.0 pg   MCHC 32.4 32.0 - 36.0 g/dL   RDW 15.0 (H) 11.5 - 14.5 %   Platelets 185 150 - 440 K/uL   Neutrophils Relative % 56 %   Neutro Abs 3.0 1.4 - 6.5 K/uL   Lymphocytes Relative 28 %   Lymphs Abs 1.5 1.0 - 3.6 K/uL   Monocytes Relative 12 %   Monocytes Absolute 0.6 0.2 - 0.9 K/uL   Eosinophils Relative 3 %  Eosinophils Absolute 0.2 0 -  0.7 K/uL   Basophils Relative 1 %   Basophils Absolute 0.1 0 - 0.1 K/uL    Radiology No results found.   Assessment/Plan  CKD (chronic kidney disease), stage III Likely contributes to leg swelling and fluid retention.  Swelling of limb Under better control with compression stockings and elevation. Continue these. Venous intervention as described below.  Severe obesity (BMI >= 40) (HCC) Contributes to lower extremity swelling. Weight loss and exercise of benefit.  Varicose veins of both legs with edema Previously performed venous duplex shows reflux in the left great saphenous vein. Given her history of ulceration on this side and the persistent swelling, I would strongly recommend laser ablation of the left great saphenous vein. Risks and benefits were discussed in detail today. Patient is agreeable to proceed.    Leotis Pain, MD  12/31/2015 1:13 PM    This note was created with Dragon medical transcription system.  Any errors from dictation are purely unintentional

## 2016-01-07 DIAGNOSIS — I10 Essential (primary) hypertension: Secondary | ICD-10-CM | POA: Diagnosis not present

## 2016-01-07 DIAGNOSIS — N2581 Secondary hyperparathyroidism of renal origin: Secondary | ICD-10-CM | POA: Diagnosis not present

## 2016-01-07 DIAGNOSIS — D631 Anemia in chronic kidney disease: Secondary | ICD-10-CM | POA: Diagnosis not present

## 2016-01-07 DIAGNOSIS — E1122 Type 2 diabetes mellitus with diabetic chronic kidney disease: Secondary | ICD-10-CM | POA: Diagnosis not present

## 2016-01-07 DIAGNOSIS — N183 Chronic kidney disease, stage 3 (moderate): Secondary | ICD-10-CM | POA: Diagnosis not present

## 2016-01-13 DIAGNOSIS — R69 Illness, unspecified: Secondary | ICD-10-CM | POA: Diagnosis not present

## 2016-01-14 ENCOUNTER — Inpatient Hospital Stay: Payer: Medicare HMO | Attending: Internal Medicine

## 2016-01-14 ENCOUNTER — Inpatient Hospital Stay (HOSPITAL_BASED_OUTPATIENT_CLINIC_OR_DEPARTMENT_OTHER): Payer: Medicare HMO | Admitting: Internal Medicine

## 2016-01-14 VITALS — BP 133/65 | HR 62 | Temp 97.7°F | Resp 18 | Wt 259.6 lb

## 2016-01-14 DIAGNOSIS — D508 Other iron deficiency anemias: Secondary | ICD-10-CM

## 2016-01-14 DIAGNOSIS — Z7982 Long term (current) use of aspirin: Secondary | ICD-10-CM | POA: Diagnosis not present

## 2016-01-14 DIAGNOSIS — E11319 Type 2 diabetes mellitus with unspecified diabetic retinopathy without macular edema: Secondary | ICD-10-CM | POA: Diagnosis not present

## 2016-01-14 DIAGNOSIS — Z79899 Other long term (current) drug therapy: Secondary | ICD-10-CM | POA: Diagnosis not present

## 2016-01-14 DIAGNOSIS — E1122 Type 2 diabetes mellitus with diabetic chronic kidney disease: Secondary | ICD-10-CM | POA: Diagnosis not present

## 2016-01-14 DIAGNOSIS — G629 Polyneuropathy, unspecified: Secondary | ICD-10-CM | POA: Insufficient documentation

## 2016-01-14 DIAGNOSIS — N2581 Secondary hyperparathyroidism of renal origin: Secondary | ICD-10-CM

## 2016-01-14 DIAGNOSIS — M48061 Spinal stenosis, lumbar region without neurogenic claudication: Secondary | ICD-10-CM | POA: Insufficient documentation

## 2016-01-14 DIAGNOSIS — N183 Chronic kidney disease, stage 3 (moderate): Secondary | ICD-10-CM | POA: Insufficient documentation

## 2016-01-14 DIAGNOSIS — I129 Hypertensive chronic kidney disease with stage 1 through stage 4 chronic kidney disease, or unspecified chronic kidney disease: Secondary | ICD-10-CM

## 2016-01-14 DIAGNOSIS — E875 Hyperkalemia: Secondary | ICD-10-CM

## 2016-01-14 DIAGNOSIS — Z86010 Personal history of colonic polyps: Secondary | ICD-10-CM | POA: Insufficient documentation

## 2016-01-14 DIAGNOSIS — M4802 Spinal stenosis, cervical region: Secondary | ICD-10-CM | POA: Insufficient documentation

## 2016-01-14 DIAGNOSIS — E785 Hyperlipidemia, unspecified: Secondary | ICD-10-CM

## 2016-01-14 DIAGNOSIS — M1990 Unspecified osteoarthritis, unspecified site: Secondary | ICD-10-CM | POA: Insufficient documentation

## 2016-01-14 DIAGNOSIS — G4730 Sleep apnea, unspecified: Secondary | ICD-10-CM

## 2016-01-14 DIAGNOSIS — R5383 Other fatigue: Secondary | ICD-10-CM

## 2016-01-14 DIAGNOSIS — D631 Anemia in chronic kidney disease: Secondary | ICD-10-CM | POA: Insufficient documentation

## 2016-01-14 DIAGNOSIS — D509 Iron deficiency anemia, unspecified: Secondary | ICD-10-CM

## 2016-01-14 LAB — CBC WITH DIFFERENTIAL/PLATELET
BASOS PCT: 1 %
Basophils Absolute: 0.1 10*3/uL (ref 0–0.1)
EOS ABS: 0.2 10*3/uL (ref 0–0.7)
EOS PCT: 3 %
HCT: 30.5 % — ABNORMAL LOW (ref 35.0–47.0)
HEMOGLOBIN: 10 g/dL — AB (ref 12.0–16.0)
LYMPHS ABS: 1.3 10*3/uL (ref 1.0–3.6)
Lymphocytes Relative: 25 %
MCH: 30.5 pg (ref 26.0–34.0)
MCHC: 32.7 g/dL (ref 32.0–36.0)
MCV: 93.4 fL (ref 80.0–100.0)
Monocytes Absolute: 0.6 10*3/uL (ref 0.2–0.9)
Monocytes Relative: 12 %
NEUTROS PCT: 59 %
Neutro Abs: 3.2 10*3/uL (ref 1.4–6.5)
PLATELETS: 180 10*3/uL (ref 150–440)
RBC: 3.26 MIL/uL — AB (ref 3.80–5.20)
RDW: 14.9 % — ABNORMAL HIGH (ref 11.5–14.5)
WBC: 5.3 10*3/uL (ref 3.6–11.0)

## 2016-01-14 LAB — FERRITIN: Ferritin: 290 ng/mL (ref 11–307)

## 2016-01-14 LAB — IRON AND TIBC
IRON: 60 ug/dL (ref 28–170)
Saturation Ratios: 18 % (ref 10.4–31.8)
TIBC: 336 ug/dL (ref 250–450)
UIBC: 276 ug/dL

## 2016-01-14 NOTE — Assessment & Plan Note (Addendum)
#   IDA/ CKD-hemoglobin today is 10.5 s/p IV venofer x4- from Hb 8.7. Iron studies- pending today. Recommend PO iron q day.   # plan more IV iron if Iron low today. Pt also might need aranesp in future if hemoglobin is still low [<10]; and her iron stores are replete.  # plan follow up in 4 months; labs- 1 week prior/possible venofer.   CC; Dr.Kolluru

## 2016-01-14 NOTE — Progress Notes (Signed)
Patient is here for results.

## 2016-01-14 NOTE — Progress Notes (Signed)
[place] OFFICE PROGRESS NOTE  Patient Care Team: Sallee Lange, NP as PCP - General (Internal Medicine)  No matching staging information was found for the patient.  # IRON DEF ANEMIA/ CKD [Dr.Kolluru; Dr.Gittin] sep 2017- Stool/UA/Myeloma work up-NEG;[facility]- 2010 [per pt] Sep 2017- IV venofer  # CKD-III [1.7]   No history exists.     INTERVAL HISTORY:  Sara Bright 75 y.o.  female pleasant patient above history of Chronic anemia/chronic kidney disease; is here for follow-up.  Note to have improvement of the fatigue post IV iron infusion. This is completely not resolved. Denies any blood in stools. Denies any black stools. No nausea no vomiting. No swelling in the legs.  REVIEW OF SYSTEMS:  A complete 10 point review of system is done which is negative except mentioned above/history of present illness.   PAST MEDICAL HISTORY :  Past Medical History:  Diagnosis Date  . Adenomatous colon polyp   . Anemia   . Cataract   . Cervical stenosis of spinal canal   . Chronic kidney disease   . Diabetes mellitus without complication ([place])   . GERD (gastroesophageal reflux disease)   . Glaucoma   . History of UTI   . Hx of gallstones   . Hyperkalemia   . Hyperlipidemia   . Hypertension   . Lumbar stenosis   . Neuropathy associated with endocrine disorder ([place])   . OSA on CPAP   . Osteoarthritis   . Osteoarthritis   . Retinopathy due to secondary diabetes ([place])   . Secondary hyperparathyroidism of renal origin [facility])     PAST SURGICAL HISTORY :   Past Surgical History:  Procedure Laterality Date  . ACDF X2    . BACK SURGERY    . Cataract extraction right    . catarect extraction left    . CHOLECYSTECTOMY    . colonoscopy with removal lesions by snare    . Endoscoic carpal tunnel release    . Laminectomy posterior lumbar facetectomy and formaninotomy w/Decomp    . Laminectony posterior cervicle decomp w/Facectomy and foraminotomy    . VEIN  LIGATION AND STRIPPING Left     FAMILY HISTORY :   Family History  Problem Relation Age of Onset  . Coronary artery disease Mother   . Diabetes type II Mother   . Hypertension Mother   . Tuberculosis Father   . Stroke Father   . Diabetes type II Sister   . Migraines Sister   . Alcohol abuse Brother   . Coronary artery disease Brother   . Diabetes type II Brother   . Kidney cancer Brother   . Kidney disease Brother   . Heart attack Brother   . Hypertension Brother     SOCIAL HISTORY:   Social History  Substance Use Topics  . Smoking status: Never Smoker  . Smokeless tobacco: Never Used  . Alcohol use No    ALLERGIES:  is allergic to hydrocodone-acetaminophen and statins.  MEDICATIONS:  Current Outpatient Prescriptions  Medication Sig Dispense Refill  . amLODipine (NORVASC) 5 MG tablet Take 5 mg by mouth daily.     Marland Kitchen aspirin EC 81 MG tablet Take 81 mg by mouth daily.    . calcitRIOL (ROCALTROL) 0.25 MCG capsule Take 0.25 mcg by mouth 3 (three) times a week.     . furosemide (LASIX) 40 MG tablet Take 40 mg by mouth daily.     Marland Kitchen gabapentin (NEURONTIN) 100 MG capsule Take 100 mg  by mouth 2 (two) times daily.     Marland Kitchen gemfibrozil (LOPID) 600 MG tablet Take 600 mg by mouth daily.     Marland Kitchen glimepiride (AMARYL) 1 MG tablet Take 1 mg by mouth daily with breakfast.     . latanoprost (XALATAN) 0.005 % ophthalmic solution Place 1 drop into both eyes.     Marland Kitchen losartan (COZAAR) 50 MG tablet Take 100 mg by mouth daily.     . Multiple Vitamins-Minerals (CENTRUM SILVER) tablet Take 1 tablet by mouth daily.     . Omega-3 Fatty Acids (FISH OIL) 1000 MG CAPS Take 1,000 mg by mouth daily.     Marland Kitchen omeprazole (PRILOSEC) 20 MG capsule Take 20 mg by mouth daily.     . pioglitazone (ACTOS) 30 MG tablet Take 30 mg by mouth daily.     . timolol (TIMOPTIC-XR) 0.5 % ophthalmic gel-forming Place 1 drop into both eyes.     . TRADJENTA 5 MG TABS tablet Take 5 mg by mouth daily.     Marland Kitchen albuterol (PROVENTIL  HFA;VENTOLIN HFA) 108 (90 Base) MCG/ACT inhaler Inhale into the lungs.     No current facility-administered medications for this visit.     PHYSICAL EXAMINATION:  BP 133/65 (BP Location: Right Arm, Patient Position: Sitting)   Pulse 62   Temp 97.7 F (36.5 C) (Tympanic)   Resp 18   Wt 259 lb 9.5 oz (117.8 kg)   BMI 44.56 kg/m   Filed Weights   01/14/16 0921  Weight: 259 lb 9.5 oz (117.8 kg)    GENERAL: Well-nourished well-developed; Alert, no distress and comfortable. Alone.  EYES: no pallor or icterus OROPHARYNX: no thrush or ulceration; good dentition  NECK: supple, no masses felt LYMPH:  no palpable lymphadenopathy in the cervical, axillary or inguinal regions LUNGS: clear to auscultation and  No wheeze or crackles HEART/CVS: regular rate & rhythm and no murmurs; No lower extremity edema ABDOMEN:abdomen soft, non-tender and normal bowel sounds Musculoskeletal:no cyanosis of digits and no clubbing  PSYCH: alert & oriented x 3 with fluent speech NEURO: no focal motor/sensory deficits SKIN:  no rashes or significant lesions  LABORATORY DATA:  I have reviewed the data as listed    Component Value Date/Time   NA 140 11/12/2015 0919   NA 137 10/23/2012 2335   K 4.7 11/12/2015 0919   K 4.4 10/23/2012 2335   CL 108 11/12/2015 0919   CL 107 10/23/2012 2335   CO2 25 11/12/2015 0919   CO2 26 10/23/2012 2335   GLUCOSE 136 (H) 11/12/2015 0919   GLUCOSE 179 (H) 10/23/2012 2335   BUN 54 (H) 11/12/2015 0919   BUN 21 (H) 10/23/2012 2335   CREATININE 1.77 (H) 11/12/2015 0919   CREATININE 1.23 10/23/2012 2335   CALCIUM 9.7 11/12/2015 0919   CALCIUM 10.3 (H) 10/23/2012 2335   PROT 7.2 11/12/2015 0919   PROT 7.4 06/16/2011 1144   ALBUMIN 3.6 11/12/2015 0919   ALBUMIN 3.5 06/16/2011 1144   AST 23 11/12/2015 0919   AST 22 08/18/2011 1026   ALT 17 11/12/2015 0919   ALT 63 06/16/2011 1144   ALKPHOS 82 11/12/2015 0919   ALKPHOS 98 06/16/2011 1144   BILITOT 0.5 11/12/2015 0919    BILITOT 0.4 06/16/2011 1144   GFRNONAA 27 (L) 11/12/2015 0919   GFRNONAA 43 (L) 10/23/2012 2335   GFRAA 31 (L) 11/12/2015 0919   GFRAA 50 (L) 10/23/2012 2335    No results found for: SPEP, UPEP  Lab Results  Component  Value Date   WBC 5.3 01/14/2016   NEUTROABS 3.2 01/14/2016   HGB 10.0 (L) 01/14/2016   HCT 30.5 (L) 01/14/2016   MCV 93.4 01/14/2016   PLT 180 01/14/2016      Chemistry      Component Value Date/Time   NA 140 11/12/2015 0919   NA 137 10/23/2012 2335   K 4.7 11/12/2015 0919   K 4.4 10/23/2012 2335   CL 108 11/12/2015 0919   CL 107 10/23/2012 2335   CO2 25 11/12/2015 0919   CO2 26 10/23/2012 2335   BUN 54 (H) 11/12/2015 0919   BUN 21 (H) 10/23/2012 2335   CREATININE 1.77 (H) 11/12/2015 0919   CREATININE 1.23 10/23/2012 2335      Component Value Date/Time   CALCIUM 9.7 11/12/2015 0919   CALCIUM 10.3 (H) 10/23/2012 2335   ALKPHOS 82 11/12/2015 0919   ALKPHOS 98 06/16/2011 1144   AST 23 11/12/2015 0919   AST 22 08/18/2011 1026   ALT 17 11/12/2015 0919   ALT 63 06/16/2011 1144   BILITOT 0.5 11/12/2015 0919   BILITOT 0.4 06/16/2011 1144       RADIOGRAPHIC STUDIES: I have personally reviewed the radiological images as listed and agreed with the findings in the report. No results found.   ASSESSMENT & PLAN:  Other iron deficiency anemia # IDA/ CKD-hemoglobin today is 10.5 s/p IV venofer x4- from Hb 8.7. Iron studies- pending today. Recommend PO iron q day.   # plan more IV iron if Iron low today. Pt also might need aranesp in future if hemoglobin is still low [<10]; and her iron stores are replete.  # plan follow up in 4 months; labs- 1 week prior/possible venofer.   CC; Dr.Kolluru  Orders Placed This Encounter  Procedures  . CBC with Differential    Standing Status:   Future    Standing Expiration Date:   01/13/2017  . Basic metabolic panel    Standing Status:   Future    Standing Expiration Date:   01/13/2017  . Ferritin    Standing  Status:   Future    Standing Expiration Date:   01/13/2017  . Iron and TIBC    Standing Status:   Future    Standing Expiration Date:   01/13/2017   All questions were answered. The patient knows to call the clinic with any problems, questions or concerns.      Sara Sickle, MD 01/14/2016 5:07 PM

## 2016-01-21 ENCOUNTER — Inpatient Hospital Stay: Payer: Medicare HMO

## 2016-01-21 VITALS — BP 131/58 | HR 65 | Temp 97.1°F | Resp 18

## 2016-01-21 DIAGNOSIS — I129 Hypertensive chronic kidney disease with stage 1 through stage 4 chronic kidney disease, or unspecified chronic kidney disease: Secondary | ICD-10-CM | POA: Diagnosis not present

## 2016-01-21 DIAGNOSIS — D508 Other iron deficiency anemias: Secondary | ICD-10-CM

## 2016-01-21 DIAGNOSIS — G4733 Obstructive sleep apnea (adult) (pediatric): Secondary | ICD-10-CM | POA: Diagnosis not present

## 2016-01-21 MED ORDER — SODIUM CHLORIDE 0.9 % IV SOLN: 200.0000 mg | Freq: Once | INTRAVENOUS | Status: DC

## 2016-01-21 MED ORDER — SODIUM CHLORIDE 0.9 % IV SOLN
Freq: Once | INTRAVENOUS | Status: AC
  Administered 2016-01-21: 11:00:00 via INTRAVENOUS
  Filled 2016-01-21: qty 1000

## 2016-01-21 MED ORDER — IRON SUCROSE 20 MG/ML IV SOLN
200.0000 mg | Freq: Once | INTRAVENOUS | Status: AC
  Administered 2016-01-21: 200 mg via INTRAVENOUS
  Filled 2016-01-21: qty 10

## 2016-01-27 DIAGNOSIS — G4733 Obstructive sleep apnea (adult) (pediatric): Secondary | ICD-10-CM | POA: Diagnosis not present

## 2016-01-28 ENCOUNTER — Inpatient Hospital Stay: Payer: Medicare HMO

## 2016-01-28 VITALS — BP 164/72 | Temp 96.7°F | Resp 20

## 2016-01-28 DIAGNOSIS — D508 Other iron deficiency anemias: Secondary | ICD-10-CM

## 2016-01-28 DIAGNOSIS — I129 Hypertensive chronic kidney disease with stage 1 through stage 4 chronic kidney disease, or unspecified chronic kidney disease: Secondary | ICD-10-CM | POA: Diagnosis not present

## 2016-01-28 MED ORDER — IRON SUCROSE 20 MG/ML IV SOLN
200.0000 mg | Freq: Once | INTRAVENOUS | Status: AC
  Administered 2016-01-28: 200 mg via INTRAVENOUS
  Filled 2016-01-28: qty 10

## 2016-01-28 MED ORDER — SODIUM CHLORIDE 0.9 % IV SOLN
Freq: Once | INTRAVENOUS | Status: AC
  Administered 2016-01-28: 10:00:00 via INTRAVENOUS
  Filled 2016-01-28: qty 1000

## 2016-02-04 ENCOUNTER — Inpatient Hospital Stay: Payer: Medicare HMO

## 2016-02-04 VITALS — BP 128/72 | HR 72 | Temp 96.9°F | Resp 20

## 2016-02-04 DIAGNOSIS — I129 Hypertensive chronic kidney disease with stage 1 through stage 4 chronic kidney disease, or unspecified chronic kidney disease: Secondary | ICD-10-CM | POA: Diagnosis not present

## 2016-02-04 DIAGNOSIS — D508 Other iron deficiency anemias: Secondary | ICD-10-CM

## 2016-02-04 MED ORDER — IRON SUCROSE 20 MG/ML IV SOLN
200.0000 mg | Freq: Once | INTRAVENOUS | Status: AC
  Administered 2016-02-04: 200 mg via INTRAVENOUS
  Filled 2016-02-04: qty 10

## 2016-02-04 MED ORDER — SODIUM CHLORIDE 0.9 % IV SOLN
Freq: Once | INTRAVENOUS | Status: AC
Start: 2016-02-04 — End: 2016-02-04
  Administered 2016-02-04: 11:00:00 via INTRAVENOUS
  Filled 2016-02-04: qty 1000

## 2016-02-07 DIAGNOSIS — Z0000 Encounter for general adult medical examination without abnormal findings: Secondary | ICD-10-CM | POA: Diagnosis not present

## 2016-02-07 DIAGNOSIS — E1142 Type 2 diabetes mellitus with diabetic polyneuropathy: Secondary | ICD-10-CM | POA: Diagnosis not present

## 2016-02-07 DIAGNOSIS — Z6841 Body Mass Index (BMI) 40.0 and over, adult: Secondary | ICD-10-CM | POA: Diagnosis not present

## 2016-02-07 DIAGNOSIS — E7800 Pure hypercholesterolemia, unspecified: Secondary | ICD-10-CM | POA: Diagnosis not present

## 2016-02-07 DIAGNOSIS — I10 Essential (primary) hypertension: Secondary | ICD-10-CM | POA: Diagnosis not present

## 2016-02-07 DIAGNOSIS — K219 Gastro-esophageal reflux disease without esophagitis: Secondary | ICD-10-CM | POA: Diagnosis not present

## 2016-02-07 DIAGNOSIS — N2581 Secondary hyperparathyroidism of renal origin: Secondary | ICD-10-CM | POA: Diagnosis not present

## 2016-02-11 ENCOUNTER — Inpatient Hospital Stay: Payer: Medicare HMO | Attending: Internal Medicine

## 2016-02-11 VITALS — BP 156/55 | HR 77 | Temp 97.4°F | Resp 20

## 2016-02-11 DIAGNOSIS — D509 Iron deficiency anemia, unspecified: Secondary | ICD-10-CM | POA: Insufficient documentation

## 2016-02-11 DIAGNOSIS — Z79899 Other long term (current) drug therapy: Secondary | ICD-10-CM | POA: Insufficient documentation

## 2016-02-11 DIAGNOSIS — D508 Other iron deficiency anemias: Secondary | ICD-10-CM

## 2016-02-11 MED ORDER — IRON SUCROSE 20 MG/ML IV SOLN
200.0000 mg | Freq: Once | INTRAVENOUS | Status: AC
  Administered 2016-02-11: 200 mg via INTRAVENOUS
  Filled 2016-02-11: qty 10

## 2016-02-11 MED ORDER — SODIUM CHLORIDE 0.9 % IV SOLN
Freq: Once | INTRAVENOUS | Status: AC
  Administered 2016-02-11: 11:00:00 via INTRAVENOUS
  Filled 2016-02-11: qty 1000

## 2016-02-11 MED ORDER — SODIUM CHLORIDE 0.9 % IV SOLN: 200.0000 mg | Freq: Once | INTRAVENOUS | Status: DC

## 2016-02-11 NOTE — Patient Instructions (Signed)
Iron Sucrose injection What is this medicine? IRON SUCROSE (AHY ern SOO krohs) is an iron complex. Iron is used to make healthy red blood cells, which carry oxygen and nutrients throughout the body. This medicine is used to treat iron deficiency anemia in people with chronic kidney disease. This medicine may be used for other purposes; ask your health care provider or pharmacist if you have questions. COMMON BRAND NAME(S): Venofer What should I tell my health care provider before I take this medicine? They need to know if you have any of these conditions: -anemia not caused by low iron levels -heart disease -high levels of iron in the blood -kidney disease -liver disease -an unusual or allergic reaction to iron, other medicines, foods, dyes, or preservatives -pregnant or trying to get pregnant -breast-feeding How should I use this medicine? This medicine is for infusion into a vein. It is given by a health care professional in a hospital or clinic setting. Talk to your pediatrician regarding the use of this medicine in children. While this drug may be prescribed for children as young as 2 years for selected conditions, precautions do apply. Overdosage: If you think you have taken too much of this medicine contact a poison control center or emergency room at once. NOTE: This medicine is only for you. Do not share this medicine with others. What if I miss a dose? It is important not to miss your dose. Call your doctor or health care professional if you are unable to keep an appointment. What may interact with this medicine? Do not take this medicine with any of the following medications: -deferoxamine -dimercaprol -other iron products This medicine may also interact with the following medications: -chloramphenicol -deferasirox This list may not describe all possible interactions. Give your health care provider a list of all the medicines, herbs, non-prescription drugs, or dietary  supplements you use. Also tell them if you smoke, drink alcohol, or use illegal drugs. Some items may interact with your medicine. What should I watch for while using this medicine? Visit your doctor or healthcare professional regularly. Tell your doctor or healthcare professional if your symptoms do not start to get better or if they get worse. You may need blood work done while you are taking this medicine. You may need to follow a special diet. Talk to your doctor. Foods that contain iron include: whole grains/cereals, dried fruits, beans, or peas, leafy green vegetables, and organ meats (liver, kidney). What side effects may I notice from receiving this medicine? Side effects that you should report to your doctor or health care professional as soon as possible: -allergic reactions like skin rash, itching or hives, swelling of the face, lips, or tongue -breathing problems -changes in blood pressure -cough -fast, irregular heartbeat -feeling faint or lightheaded, falls -fever or chills -flushing, sweating, or hot feelings -joint or muscle aches/pains -seizures -swelling of the ankles or feet -unusually weak or tired Side effects that usually do not require medical attention (report to your doctor or health care professional if they continue or are bothersome): -diarrhea -feeling achy -headache -irritation at site where injected -nausea, vomiting -stomach upset -tiredness This list may not describe all possible side effects. Call your doctor for medical advice about side effects. You may report side effects to FDA at 1-800-FDA-1088. Where should I keep my medicine? This drug is given in a hospital or clinic and will not be stored at home. NOTE: This sheet is a summary. It may not cover all possible information. If   you have questions about this medicine, talk to your doctor, pharmacist, or health care provider.  2017 Elsevier/Gold Standard (2010-12-04 17:14:35)  

## 2016-02-20 DIAGNOSIS — G4733 Obstructive sleep apnea (adult) (pediatric): Secondary | ICD-10-CM | POA: Diagnosis not present

## 2016-02-28 ENCOUNTER — Encounter (INDEPENDENT_AMBULATORY_CARE_PROVIDER_SITE_OTHER): Payer: Self-pay | Admitting: Vascular Surgery

## 2016-02-28 ENCOUNTER — Ambulatory Visit (INDEPENDENT_AMBULATORY_CARE_PROVIDER_SITE_OTHER): Payer: Medicare HMO | Admitting: Vascular Surgery

## 2016-02-28 VITALS — BP 146/73 | HR 74 | Resp 16 | Ht 65.5 in | Wt 250.0 lb

## 2016-02-28 DIAGNOSIS — I83893 Varicose veins of bilateral lower extremities with other complications: Secondary | ICD-10-CM

## 2016-02-28 NOTE — Progress Notes (Signed)
The patient's left lower extremity was sterilely prepped and draped. The ultrasound machine was used to visualize the saphenous vein throughout its course. A segment at the knee was selected for access. The saphenous vein was accessed with a minimal amount of difficulty using ultrasound guidance with a micro puncture needle. A micro puncture wire and sheath were then placed. A 0.018 wire was placed beyond the saphenofemoral junction through the sheath and the micro puncture sheath was removed. The 65 cm sheath was then placed over the wire and the wire and dilator were removed. The laser fiber was placed through the sheath and its tip was placed approximately 4-5 cm below the saphenofemoral junction. Tumescent anesthesia was then created with a dilute lidocaine solution. Laser energy was then delivered with constant withdrawal of the sheath and laser fiber. Approximately 1204 Joules of energy were delivered over a length of 29 cm using the 1470 Hz VenaCure machine at [employer]. Sterile dressings were placed. The patient tolerated the procedure well without complications.

## 2016-02-28 NOTE — Assessment & Plan Note (Signed)
See laser note 

## 2016-02-29 DIAGNOSIS — G4733 Obstructive sleep apnea (adult) (pediatric): Secondary | ICD-10-CM | POA: Diagnosis not present

## 2016-03-03 ENCOUNTER — Ambulatory Visit (INDEPENDENT_AMBULATORY_CARE_PROVIDER_SITE_OTHER): Payer: Medicare HMO

## 2016-03-03 DIAGNOSIS — I83893 Varicose veins of bilateral lower extremities with other complications: Secondary | ICD-10-CM | POA: Diagnosis not present

## 2016-03-09 DIAGNOSIS — J449 Chronic obstructive pulmonary disease, unspecified: Secondary | ICD-10-CM | POA: Insufficient documentation

## 2016-03-09 DIAGNOSIS — I700 Atherosclerosis of aorta: Secondary | ICD-10-CM | POA: Insufficient documentation

## 2016-03-22 DIAGNOSIS — G4733 Obstructive sleep apnea (adult) (pediatric): Secondary | ICD-10-CM | POA: Diagnosis not present

## 2016-03-24 ENCOUNTER — Ambulatory Visit (INDEPENDENT_AMBULATORY_CARE_PROVIDER_SITE_OTHER): Payer: Medicare HMO | Admitting: Vascular Surgery

## 2016-03-24 VITALS — BP 173/69 | HR 77 | Resp 16 | Ht 65.0 in | Wt 256.0 lb

## 2016-03-24 DIAGNOSIS — N183 Chronic kidney disease, stage 3 unspecified: Secondary | ICD-10-CM

## 2016-03-24 DIAGNOSIS — I83893 Varicose veins of bilateral lower extremities with other complications: Secondary | ICD-10-CM

## 2016-03-24 NOTE — Assessment & Plan Note (Signed)
She is doing well status post laser ablation of the left great saphenous vein with significant improvement in her symptoms. Continue compression stockings and elevation. Return to clinic in 3 months.

## 2016-03-24 NOTE — Assessment & Plan Note (Signed)
Weight loss and exercise recommended to reduce lower extremity pain and swelling.

## 2016-03-24 NOTE — Progress Notes (Signed)
MRN : [id number]  Sara Bright is a 76 y.o. (04-24-1940) female who presents with chief complaint of  Chief Complaint  Patient presents with  . Re-evaluation    3-4 week post laser  .  History of Present Illness: Patient returns today in follow up of Her leg swelling. She is about one month status post left great saphenous vein laser ablation. She has noticed a significant improvement in terms of the pain and swelling in the left leg after the procedure. Her swelling is appreciably better. She reports a small scab on the anterior shin of the left leg, but no weeping or drainage has been seen.  Current Outpatient Prescriptions  Medication Sig Dispense Refill  . albuterol (PROVENTIL HFA;VENTOLIN HFA) 108 (90 Base) MCG/ACT inhaler Inhale into the lungs.    Marland Kitchen amLODipine (NORVASC) 5 MG tablet Take by mouth.    . calcitRIOL (ROCALTROL) 0.25 MCG capsule     . furosemide (LASIX) 40 MG tablet     . gabapentin (NEURONTIN) 100 MG capsule     . gemfibrozil (LOPID) 600 MG tablet     . glimepiride (AMARYL) 1 MG tablet     . latanoprost (XALATAN) 0.005 % ophthalmic solution     . losartan (COZAAR) 50 MG tablet Take by mouth.    . Multiple Vitamins-Minerals (CENTRUM SILVER) tablet Take by mouth.    . Omega-3 Fatty Acids (FISH OIL) 1000 MG CAPS Take by mouth.    Marland Kitchen omeprazole (PRILOSEC) 20 MG capsule     . pioglitazone (ACTOS) 30 MG tablet Take by mouth.    . timolol (TIMOPTIC-XR) 0.5 % ophthalmic gel-forming Apply to eye.    . TRADJENTA 5 MG TABS tablet      No current facility-administered medications for this visit.         Past Medical History:  Diagnosis Date  . Adenomatous colon polyp   . Anemia   . Cataract   . Cervical stenosis of spinal canal   . Chronic kidney disease   . Diabetes mellitus without complication ([place])   . GERD (gastroesophageal reflux disease)   . Glaucoma   . History of UTI   . Hx of gallstones   .  Hyperkalemia   . Hyperlipidemia   . Hypertension   . Lumbar stenosis   . Neuropathy associated with endocrine disorder ([place])   . OSA on CPAP   . Osteoarthritis   . Osteoarthritis   . Retinopathy due to secondary diabetes ([place])   . Secondary hyperparathyroidism of renal origin [facility])          Past Surgical History:  Procedure Laterality Date  . ACDF X2    . BACK SURGERY    . Cataract extraction right    . catarect extraction left    . CHOLECYSTECTOMY    . colonoscopy with removal lesions by snare    . Endoscoic carpal tunnel release    . Laminectomy posterior lumbar facetectomy and formaninotomy w/Decomp    . Laminectony posterior cervicle decomp w/Facectomy and foraminotomy    . VEIN LIGATION AND STRIPPING Left     Social History Social History  Substance Use Topics  . Smoking status: Never Smoker  . Smokeless tobacco: Never Used  . Alcohol use No     Family History      Family History  Problem Relation Age of Onset  . Coronary artery disease Mother   . Diabetes type II Mother   . Hypertension Mother   .  Tuberculosis Father   . Stroke Father   . Diabetes type II Sister   . Migraines Sister   . Alcohol abuse Brother   . Coronary artery disease Brother   . Diabetes type II Brother   . Kidney cancer Brother   . Kidney disease Brother   . Heart attack Brother   . Hypertension Brother           Allergies  Allergen Reactions  . Hydrocodone-Acetaminophen Other (See Comments)    Sedation and GI upset, pt is not allergic to acetaminophen  . Statins Other (See Comments)     REVIEW OF SYSTEMS (Negative unless checked)  Constitutional: [] Weight loss  [] Fever  [] Chills Cardiac: [] Chest pain   [] Chest pressure   [] Palpitations   [] Shortness of breath when laying flat   [] Shortness of breath at rest   [] Shortness of breath with exertion. Vascular:  [] Pain in legs with walking   [] Pain in legs at rest    [] Pain in legs when laying flat   [] Claudication   [] Pain in feet when walking  [] Pain in feet at rest  [] Pain in feet when laying flat   [] History of DVT   [] Phlebitis   [x] Swelling in legs   [x] Varicose veins   [] Non-healing ulcers Pulmonary:   [] Uses home oxygen   [] Productive cough   [] Hemoptysis   [] Wheeze  [] COPD   [] Asthma Neurologic:  [] Dizziness  [] Blackouts   [] Seizures   [] History of stroke   [] History of TIA  [] Aphasia   [] Temporary blindness   [] Dysphagia   [] Weakness or numbness in arms   [] Weakness or numbness in legs Musculoskeletal:  [] Arthritis   [] Joint swelling   [] Joint pain   [] Low back pain Hematologic:  [] Easy bruising  [] Easy bleeding   [] Hypercoagulable state   [] Anemic   Gastrointestinal:  [] Blood in stool   [] Vomiting blood  [] Gastroesophageal reflux/heartburn   [] Abdominal pain Genitourinary:  [] Chronic kidney disease   [] Difficult urination  [] Frequent urination  [] Burning with urination   [] Hematuria Skin:  [] Rashes   [] Ulcers   [] Wounds Psychological:  [] History of anxiety   []  History of major depression.  Physical Examination  BP (!) 147/69   Pulse 81   Resp 16   Ht 5\' 4"  (1.626 m)   Wt 260 lb (117.9 kg)   BMI 44.63 kg/m  Gen:  WD/WN, NAD Head: [place]/AT, No temporalis wasting. Ear/Nose/Throat: Hearing grossly intact, nares w/o erythema or drainage, trachea midline Eyes: Conjunctiva clear. Sclera non-icteric Neck: Supple.  No JVD.  Pulmonary:  Good air movement, no use of accessory muscles.  Cardiac: RRR, normal S1, S2 Vascular:  Vessel Right Left  Radial Palpable Palpable  Ulnar Palpable Palpable  Brachial Palpable Palpable  Carotid Palpable, without bruit Palpable, without bruit  Aorta Not palpable N/A  Femoral Palpable Palpable  Popliteal Palpable Palpable  PT Not Palpable Not Palpable  DP Palpable Palpable   Gastrointestinal: soft, non-tender/non-distended. No guarding/reflex.  Musculoskeletal: M/S 5/5 throughout.  No deformity or atrophy.  2-3+ right lower extremity edema and 1+ left lower extremity edema. Neurologic: Sensation grossly intact in extremities.  Symmetrical.  Speech is fluent.  Psychiatric: Judgment intact, Mood & affect appropriate for pt's clinical situation. Dermatologic: No rashes or ulcers noted.  No cellulitis or open wounds. Previous ulceration on left lateral calf has healed Lymph : No Cervical, Axillary, or Inguinal lymphadenopathy.       Labs Recent Results (from the past 2160 hour(s))  CBC with Differential  Status: Abnormal   Collection Time: 01/14/16  9:03 AM  Result Value Ref Range   WBC 5.3 3.6 - 11.0 K/uL   RBC 3.26 (L) 3.80 - 5.20 MIL/uL   Hemoglobin 10.0 (L) 12.0 - 16.0 g/dL   HCT 30.5 (L) 35.0 - 47.0 %   MCV 93.4 80.0 - 100.0 fL   MCH 30.5 26.0 - 34.0 pg   MCHC 32.7 32.0 - 36.0 g/dL   RDW 14.9 (H) 11.5 - 14.5 %   Platelets 180 150 - 440 K/uL   Neutrophils Relative % 59 %   Neutro Abs 3.2 1.4 - 6.5 K/uL   Lymphocytes Relative 25 %   Lymphs Abs 1.3 1.0 - 3.6 K/uL   Monocytes Relative 12 %   Monocytes Absolute 0.6 0.2 - 0.9 K/uL   Eosinophils Relative 3 %   Eosinophils Absolute 0.2 0 - 0.7 K/uL   Basophils Relative 1 %   Basophils Absolute 0.1 0 - 0.1 K/uL  Iron and TIBC     Status: None   Collection Time: 01/14/16  9:03 AM  Result Value Ref Range   Iron 60 28 - 170 ug/dL   TIBC 336 250 - 450 ug/dL   Saturation Ratios 18 10.4 - 31.8 %   UIBC 276 ug/dL  Ferritin     Status: None   Collection Time: 01/14/16  9:03 AM  Result Value Ref Range   Ferritin 290 11 - 307 ng/mL    Radiology No results found.   Assessment/Plan  CKD (chronic kidney disease), stage III Likely contributes to leg swelling and fluid retention.  Severe obesity (BMI >= 40) (HCC) Weight loss and exercise recommended to reduce lower extremity pain and swelling.  Varicose veins of both legs with edema She is doing well status post laser ablation of the left great saphenous vein with significant  improvement in her symptoms. Continue compression stockings and elevation. Return to clinic in 3 months.    Leotis Pain, MD  03/24/2016 9:56 AM    This note was created with Dragon medical transcription system.  Any errors from dictation are purely unintentional

## 2016-03-24 NOTE — Assessment & Plan Note (Signed)
Likely contributes to leg swelling and fluid retention.

## 2016-03-25 DIAGNOSIS — R69 Illness, unspecified: Secondary | ICD-10-CM | POA: Diagnosis not present

## 2016-03-31 DIAGNOSIS — G4733 Obstructive sleep apnea (adult) (pediatric): Secondary | ICD-10-CM | POA: Diagnosis not present

## 2016-04-16 DIAGNOSIS — I2721 Secondary pulmonary arterial hypertension: Secondary | ICD-10-CM | POA: Diagnosis not present

## 2016-04-16 DIAGNOSIS — G4733 Obstructive sleep apnea (adult) (pediatric): Secondary | ICD-10-CM | POA: Diagnosis not present

## 2016-04-16 DIAGNOSIS — Z6841 Body Mass Index (BMI) 40.0 and over, adult: Secondary | ICD-10-CM | POA: Diagnosis not present

## 2016-04-16 DIAGNOSIS — R0609 Other forms of dyspnea: Secondary | ICD-10-CM | POA: Diagnosis not present

## 2016-04-22 DIAGNOSIS — G4733 Obstructive sleep apnea (adult) (pediatric): Secondary | ICD-10-CM | POA: Diagnosis not present

## 2016-05-01 DIAGNOSIS — G4733 Obstructive sleep apnea (adult) (pediatric): Secondary | ICD-10-CM | POA: Diagnosis not present

## 2016-05-04 DIAGNOSIS — R0609 Other forms of dyspnea: Secondary | ICD-10-CM | POA: Diagnosis not present

## 2016-05-04 DIAGNOSIS — I2721 Secondary pulmonary arterial hypertension: Secondary | ICD-10-CM | POA: Diagnosis not present

## 2016-05-06 DIAGNOSIS — N183 Chronic kidney disease, stage 3 (moderate): Secondary | ICD-10-CM | POA: Diagnosis not present

## 2016-05-06 DIAGNOSIS — D631 Anemia in chronic kidney disease: Secondary | ICD-10-CM | POA: Diagnosis not present

## 2016-05-06 DIAGNOSIS — E1122 Type 2 diabetes mellitus with diabetic chronic kidney disease: Secondary | ICD-10-CM | POA: Diagnosis not present

## 2016-05-06 DIAGNOSIS — N2581 Secondary hyperparathyroidism of renal origin: Secondary | ICD-10-CM | POA: Diagnosis not present

## 2016-05-06 DIAGNOSIS — I10 Essential (primary) hypertension: Secondary | ICD-10-CM | POA: Diagnosis not present

## 2016-05-11 DIAGNOSIS — H401111 Primary open-angle glaucoma, right eye, mild stage: Secondary | ICD-10-CM | POA: Diagnosis not present

## 2016-05-12 ENCOUNTER — Inpatient Hospital Stay: Payer: Medicare HMO | Attending: Internal Medicine

## 2016-05-12 ENCOUNTER — Other Ambulatory Visit: Payer: Self-pay | Admitting: Internal Medicine

## 2016-05-12 DIAGNOSIS — Z7984 Long term (current) use of oral hypoglycemic drugs: Secondary | ICD-10-CM | POA: Insufficient documentation

## 2016-05-12 DIAGNOSIS — N2581 Secondary hyperparathyroidism of renal origin: Secondary | ICD-10-CM | POA: Insufficient documentation

## 2016-05-12 DIAGNOSIS — E11319 Type 2 diabetes mellitus with unspecified diabetic retinopathy without macular edema: Secondary | ICD-10-CM | POA: Insufficient documentation

## 2016-05-12 DIAGNOSIS — E875 Hyperkalemia: Secondary | ICD-10-CM | POA: Insufficient documentation

## 2016-05-12 DIAGNOSIS — I129 Hypertensive chronic kidney disease with stage 1 through stage 4 chronic kidney disease, or unspecified chronic kidney disease: Secondary | ICD-10-CM | POA: Insufficient documentation

## 2016-05-12 DIAGNOSIS — D508 Other iron deficiency anemias: Secondary | ICD-10-CM

## 2016-05-12 DIAGNOSIS — N184 Chronic kidney disease, stage 4 (severe): Secondary | ICD-10-CM | POA: Diagnosis not present

## 2016-05-12 DIAGNOSIS — Z7982 Long term (current) use of aspirin: Secondary | ICD-10-CM | POA: Insufficient documentation

## 2016-05-12 DIAGNOSIS — D649 Anemia, unspecified: Secondary | ICD-10-CM | POA: Diagnosis not present

## 2016-05-12 DIAGNOSIS — E1122 Type 2 diabetes mellitus with diabetic chronic kidney disease: Secondary | ICD-10-CM | POA: Diagnosis not present

## 2016-05-12 DIAGNOSIS — Z79899 Other long term (current) drug therapy: Secondary | ICD-10-CM | POA: Insufficient documentation

## 2016-05-12 DIAGNOSIS — Z86010 Personal history of colonic polyps: Secondary | ICD-10-CM | POA: Insufficient documentation

## 2016-05-12 DIAGNOSIS — K219 Gastro-esophageal reflux disease without esophagitis: Secondary | ICD-10-CM | POA: Diagnosis not present

## 2016-05-12 DIAGNOSIS — E119 Type 2 diabetes mellitus without complications: Secondary | ICD-10-CM | POA: Insufficient documentation

## 2016-05-12 DIAGNOSIS — M1990 Unspecified osteoarthritis, unspecified site: Secondary | ICD-10-CM | POA: Diagnosis not present

## 2016-05-12 DIAGNOSIS — E785 Hyperlipidemia, unspecified: Secondary | ICD-10-CM | POA: Diagnosis not present

## 2016-05-12 DIAGNOSIS — Z8051 Family history of malignant neoplasm of kidney: Secondary | ICD-10-CM | POA: Insufficient documentation

## 2016-05-12 DIAGNOSIS — M48061 Spinal stenosis, lumbar region without neurogenic claudication: Secondary | ICD-10-CM | POA: Insufficient documentation

## 2016-05-12 DIAGNOSIS — Z87440 Personal history of urinary (tract) infections: Secondary | ICD-10-CM | POA: Diagnosis not present

## 2016-05-12 DIAGNOSIS — G4733 Obstructive sleep apnea (adult) (pediatric): Secondary | ICD-10-CM | POA: Diagnosis not present

## 2016-05-12 LAB — IRON AND TIBC
IRON: 75 ug/dL (ref 28–170)
SATURATION RATIOS: 23 % (ref 10.4–31.8)
TIBC: 334 ug/dL (ref 250–450)
UIBC: 259 ug/dL

## 2016-05-12 LAB — BASIC METABOLIC PANEL
Anion gap: 10 (ref 5–15)
BUN: 43 mg/dL — ABNORMAL HIGH (ref 6–20)
CHLORIDE: 101 mmol/L (ref 101–111)
CO2: 24 mmol/L (ref 22–32)
Calcium: 9.8 mg/dL (ref 8.9–10.3)
Creatinine, Ser: 1.6 mg/dL — ABNORMAL HIGH (ref 0.44–1.00)
GFR calc non Af Amer: 30 mL/min — ABNORMAL LOW (ref >60)
GFR, EST AFRICAN AMERICAN: 35 mL/min — AB (ref >60)
Glucose, Bld: 114 mg/dL — ABNORMAL HIGH (ref 65–99)
POTASSIUM: 4.5 mmol/L (ref 3.5–5.1)
SODIUM: 135 mmol/L (ref 135–145)

## 2016-05-12 LAB — CBC WITH DIFFERENTIAL/PLATELET
BASOS PCT: 1 %
Basophils Absolute: 0.1 10*3/uL (ref 0–0.1)
EOS ABS: 0.2 10*3/uL (ref 0–0.7)
Eosinophils Relative: 3 %
HEMATOCRIT: 30.4 % — AB (ref 35.0–47.0)
Hemoglobin: 9.8 g/dL — ABNORMAL LOW (ref 12.0–16.0)
LYMPHS ABS: 1.3 10*3/uL (ref 1.0–3.6)
Lymphocytes Relative: 28 %
MCH: 31 pg (ref 26.0–34.0)
MCHC: 32.3 g/dL (ref 32.0–36.0)
MCV: 96.1 fL (ref 80.0–100.0)
Monocytes Absolute: 0.6 10*3/uL (ref 0.2–0.9)
Monocytes Relative: 12 %
NEUTROS ABS: 2.6 10*3/uL (ref 1.4–6.5)
NEUTROS PCT: 56 %
Platelets: 173 10*3/uL (ref 150–440)
RBC: 3.17 MIL/uL — AB (ref 3.80–5.20)
RDW: 14.9 % — ABNORMAL HIGH (ref 11.5–14.5)
WBC: 4.6 10*3/uL (ref 3.6–11.0)

## 2016-05-12 LAB — FERRITIN: FERRITIN: 651 ng/mL — AB (ref 11–307)

## 2016-05-19 ENCOUNTER — Inpatient Hospital Stay (HOSPITAL_BASED_OUTPATIENT_CLINIC_OR_DEPARTMENT_OTHER): Payer: Medicare HMO | Admitting: Internal Medicine

## 2016-05-19 ENCOUNTER — Inpatient Hospital Stay: Payer: Medicare HMO

## 2016-05-19 VITALS — BP 127/69 | HR 63 | Temp 97.3°F | Resp 18 | Wt 261.0 lb

## 2016-05-19 DIAGNOSIS — N183 Chronic kidney disease, stage 3 (moderate): Principal | ICD-10-CM

## 2016-05-19 DIAGNOSIS — Z7982 Long term (current) use of aspirin: Secondary | ICD-10-CM

## 2016-05-19 DIAGNOSIS — E875 Hyperkalemia: Secondary | ICD-10-CM

## 2016-05-19 DIAGNOSIS — M48061 Spinal stenosis, lumbar region without neurogenic claudication: Secondary | ICD-10-CM | POA: Diagnosis not present

## 2016-05-19 DIAGNOSIS — Z7984 Long term (current) use of oral hypoglycemic drugs: Secondary | ICD-10-CM

## 2016-05-19 DIAGNOSIS — M1990 Unspecified osteoarthritis, unspecified site: Secondary | ICD-10-CM

## 2016-05-19 DIAGNOSIS — K219 Gastro-esophageal reflux disease without esophagitis: Secondary | ICD-10-CM

## 2016-05-19 DIAGNOSIS — E11319 Type 2 diabetes mellitus with unspecified diabetic retinopathy without macular edema: Secondary | ICD-10-CM | POA: Diagnosis not present

## 2016-05-19 DIAGNOSIS — Z87440 Personal history of urinary (tract) infections: Secondary | ICD-10-CM

## 2016-05-19 DIAGNOSIS — N184 Chronic kidney disease, stage 4 (severe): Secondary | ICD-10-CM

## 2016-05-19 DIAGNOSIS — G4733 Obstructive sleep apnea (adult) (pediatric): Secondary | ICD-10-CM

## 2016-05-19 DIAGNOSIS — I129 Hypertensive chronic kidney disease with stage 1 through stage 4 chronic kidney disease, or unspecified chronic kidney disease: Secondary | ICD-10-CM

## 2016-05-19 DIAGNOSIS — E119 Type 2 diabetes mellitus without complications: Secondary | ICD-10-CM

## 2016-05-19 DIAGNOSIS — D649 Anemia, unspecified: Secondary | ICD-10-CM | POA: Diagnosis not present

## 2016-05-19 DIAGNOSIS — E1122 Type 2 diabetes mellitus with diabetic chronic kidney disease: Secondary | ICD-10-CM | POA: Diagnosis not present

## 2016-05-19 DIAGNOSIS — E785 Hyperlipidemia, unspecified: Secondary | ICD-10-CM | POA: Diagnosis not present

## 2016-05-19 DIAGNOSIS — Z79899 Other long term (current) drug therapy: Secondary | ICD-10-CM

## 2016-05-19 DIAGNOSIS — D631 Anemia in chronic kidney disease: Secondary | ICD-10-CM

## 2016-05-19 DIAGNOSIS — Z86010 Personal history of colonic polyps: Secondary | ICD-10-CM

## 2016-05-19 DIAGNOSIS — Z8051 Family history of malignant neoplasm of kidney: Secondary | ICD-10-CM

## 2016-05-19 DIAGNOSIS — N2581 Secondary hyperparathyroidism of renal origin: Secondary | ICD-10-CM

## 2016-05-19 NOTE — Progress Notes (Signed)
Patient here today for follow up.  Patient states no new concerns today  

## 2016-05-19 NOTE — Assessment & Plan Note (Addendum)
#   IDA/ CKD-hemoglobin today is 9.8; iron saturation 23. Ferritin 600. Patient currently taking iron 65 mg once a day. Recommend by mouth iron twice a day. Discussed the potential side effects. Discussed dietary changes.  # Also discussed the possible need of aranesp in future if hemoglobin is still low [<10]; and her iron stores are replete.  # plan follow up in 4 months; labs- 1 week prior/possible venofer.   CC; Dr.Lateef

## 2016-05-19 NOTE — Progress Notes (Signed)
[facility] OFFICE PROGRESS NOTE  Patient Care Team: Sallee Lange, NP as PCP - General (Internal Medicine)  Cancer Staging No matching staging information was found for the patient.  # IRON DEF ANEMIA/ CKD [Dr.Lateef; Dr.Gittin] sep 2017- Stool/UA/Myeloma work up-NEG;[facility]- 2010 [per pt] Sep 2017- IV venofer  # CKD-III [1.7]   No history exists.     INTERVAL HISTORY:  Sara Bright 75 y.o.  female pleasant patient above history of Chronic anemia/chronic kidney disease; is here for follow-up.  Patient currently taking iron pills once a day. Denies any significant discomfort. Denies any constipation. Denies any blood in stools.  No nausea no vomiting. No swelling in the legs. She follows closely with nephrology Dr. Holley Raring.  REVIEW OF SYSTEMS:  A complete 10 point review of system is done which is negative except mentioned above/history of present illness.   PAST MEDICAL HISTORY :  Past Medical History:  Diagnosis Date  . Adenomatous colon polyp   . Anemia   . Cataract   . Cervical stenosis of spinal canal   . Chronic kidney disease   . Diabetes mellitus without complication ([place])   . GERD (gastroesophageal reflux disease)   . Glaucoma   . History of UTI   . Hx of gallstones   . Hyperkalemia   . Hyperlipidemia   . Hypertension   . Lumbar stenosis   . Neuropathy associated with endocrine disorder ([place])   . OSA on CPAP   . Osteoarthritis   . Osteoarthritis   . Retinopathy due to secondary diabetes ([place])   . Secondary hyperparathyroidism of renal origin [facility])     PAST SURGICAL HISTORY :   Past Surgical History:  Procedure Laterality Date  . ACDF X2    . BACK SURGERY    . Cataract extraction right    . catarect extraction left    . CHOLECYSTECTOMY    . colonoscopy with removal lesions by snare    . Endoscoic carpal tunnel release    . Laminectomy posterior lumbar facetectomy and formaninotomy w/Decomp    . Laminectony posterior cervicle  decomp w/Facectomy and foraminotomy    . VEIN LIGATION AND STRIPPING Left     FAMILY HISTORY :   Family History  Problem Relation Age of Onset  . Coronary artery disease Mother   . Diabetes type II Mother   . Hypertension Mother   . Tuberculosis Father   . Stroke Father   . Diabetes type II Sister   . Migraines Sister   . Alcohol abuse Brother   . Coronary artery disease Brother   . Diabetes type II Brother   . Kidney cancer Brother   . Kidney disease Brother   . Heart attack Brother   . Hypertension Brother     SOCIAL HISTORY:   Social History  Substance Use Topics  . Smoking status: Never Smoker  . Smokeless tobacco: Never Used  . Alcohol use No    ALLERGIES:  is allergic to hydrocodone-acetaminophen and statins.  MEDICATIONS:  Current Outpatient Prescriptions  Medication Sig Dispense Refill  . amLODipine (NORVASC) 5 MG tablet Take 5 mg by mouth daily.     Marland Kitchen aspirin EC 81 MG tablet Take 81 mg by mouth daily.    . calcitRIOL (ROCALTROL) 0.25 MCG capsule Take 0.25 mcg by mouth 3 (three) times a week.     . carvedilol (COREG) 6.25 MG tablet Take 6.25 mg by mouth 2 (two) times daily.  12  . ferrous  sulfate 325 (65 FE) MG tablet Take 325 mg by mouth daily.    . furosemide (LASIX) 40 MG tablet Take 40 mg by mouth daily.     Marland Kitchen gabapentin (NEURONTIN) 100 MG capsule Take 100 mg by mouth 2 (two) times daily.     Marland Kitchen gemfibrozil (LOPID) 600 MG tablet Take 600 mg by mouth daily.     Marland Kitchen glimepiride (AMARYL) 1 MG tablet Take 1 mg by mouth daily with breakfast.     . latanoprost (XALATAN) 0.005 % ophthalmic solution Place 1 drop into both eyes.     Marland Kitchen losartan (COZAAR) 50 MG tablet Take 100 mg by mouth daily.     . Multiple Vitamins-Minerals (CENTRUM SILVER) tablet Take 1 tablet by mouth daily.     . Omega-3 Fatty Acids (FISH OIL) 1000 MG CAPS Take 1,000 mg by mouth daily.     Marland Kitchen omeprazole (PRILOSEC) 20 MG capsule Take 20 mg by mouth daily.     . pioglitazone (ACTOS) 30 MG tablet  Take 30 mg by mouth daily.     . timolol (TIMOPTIC-XR) 0.5 % ophthalmic gel-forming Place 1 drop into both eyes.     . TRADJENTA 5 MG TABS tablet Take 5 mg by mouth daily.      No current facility-administered medications for this visit.     PHYSICAL EXAMINATION:  BP 127/69 (BP Location: Left Arm, Patient Position: Sitting)   Pulse 63   Temp 97.3 F (36.3 C) (Tympanic)   Resp 18   Wt 261 lb 0.4 oz (118.4 kg)   BMI 43.44 kg/m   Filed Weights   05/19/16 0918  Weight: 261 lb 0.4 oz (118.4 kg)    GENERAL: Well-nourished well-developed; Alert, no distress and comfortable. Alone.  EYES: no pallor or icterus OROPHARYNX: no thrush or ulceration; good dentition  NECK: supple, no masses felt LYMPH:  no palpable lymphadenopathy in the cervical, axillary or inguinal regions LUNGS: clear to auscultation and  No wheeze or crackles HEART/CVS: regular rate & rhythm and no murmurs; No lower extremity edema ABDOMEN:abdomen soft, non-tender and normal bowel sounds Musculoskeletal:no cyanosis of digits and no clubbing  PSYCH: alert & oriented x 3 with fluent speech NEURO: no focal motor/sensory deficits SKIN:  no rashes or significant lesions  LABORATORY DATA:  I have reviewed the data as listed    Component Value Date/Time   NA 135 05/12/2016 0853   NA 137 10/23/2012 2335   K 4.5 05/12/2016 0853   K 4.4 10/23/2012 2335   CL 101 05/12/2016 0853   CL 107 10/23/2012 2335   CO2 24 05/12/2016 0853   CO2 26 10/23/2012 2335   GLUCOSE 114 (H) 05/12/2016 0853   GLUCOSE 179 (H) 10/23/2012 2335   BUN 43 (H) 05/12/2016 0853   BUN 21 (H) 10/23/2012 2335   CREATININE 1.60 (H) 05/12/2016 0853   CREATININE 1.23 10/23/2012 2335   CALCIUM 9.8 05/12/2016 0853   CALCIUM 10.3 (H) 10/23/2012 2335   PROT 7.2 11/12/2015 0919   PROT 7.4 06/16/2011 1144   ALBUMIN 3.6 11/12/2015 0919   ALBUMIN 3.5 06/16/2011 1144   AST 23 11/12/2015 0919   AST 22 08/18/2011 1026   ALT 17 11/12/2015 0919   ALT 63  06/16/2011 1144   ALKPHOS 82 11/12/2015 0919   ALKPHOS 98 06/16/2011 1144   BILITOT 0.5 11/12/2015 0919   BILITOT 0.4 06/16/2011 1144   GFRNONAA 30 (L) 05/12/2016 0853   GFRNONAA 43 (L) 10/23/2012 2335   GFRAA 35 (L)  05/12/2016 0853   GFRAA 50 (L) 10/23/2012 2335    No results found for: SPEP, UPEP  Lab Results  Component Value Date   WBC 4.6 05/12/2016   NEUTROABS 2.6 05/12/2016   HGB 9.8 (L) 05/12/2016   HCT 30.4 (L) 05/12/2016   MCV 96.1 05/12/2016   PLT 173 05/12/2016      Chemistry      Component Value Date/Time   NA 135 05/12/2016 0853   NA 137 10/23/2012 2335   K 4.5 05/12/2016 0853   K 4.4 10/23/2012 2335   CL 101 05/12/2016 0853   CL 107 10/23/2012 2335   CO2 24 05/12/2016 0853   CO2 26 10/23/2012 2335   BUN 43 (H) 05/12/2016 0853   BUN 21 (H) 10/23/2012 2335   CREATININE 1.60 (H) 05/12/2016 0853   CREATININE 1.23 10/23/2012 2335      Component Value Date/Time   CALCIUM 9.8 05/12/2016 0853   CALCIUM 10.3 (H) 10/23/2012 2335   ALKPHOS 82 11/12/2015 0919   ALKPHOS 98 06/16/2011 1144   AST 23 11/12/2015 0919   AST 22 08/18/2011 1026   ALT 17 11/12/2015 0919   ALT 63 06/16/2011 1144   BILITOT 0.5 11/12/2015 0919   BILITOT 0.4 06/16/2011 1144       RADIOGRAPHIC STUDIES: I have personally reviewed the radiological images as listed and agreed with the findings in the report. No results found.   ASSESSMENT & PLAN:  Anemia due to stage 3 chronic kidney disease # IDA/ CKD-hemoglobin today is 9.8; iron saturation 23. Ferritin 600. Patient currently taking iron 65 mg once a day. Recommend by mouth iron twice a day. Discussed the potential side effects. Discussed dietary changes.  # Also discussed the possible need of aranesp in future if hemoglobin is still low [<10]; and her iron stores are replete.  # plan follow up in 4 months; labs- 1 week prior/possible venofer.   CC; Dr.Lateef  Orders Placed This Encounter  Procedures  . CBC with  Differential/Platelet    Standing Status:   Future    Standing Expiration Date:   11/19/2016  . Comprehensive metabolic panel    Standing Status:   Future    Standing Expiration Date:   11/19/2016  . Iron and TIBC    Standing Status:   Future    Standing Expiration Date:   11/19/2016  . Ferritin    Standing Status:   Future    Standing Expiration Date:   11/19/2016   All questions were answered. The patient knows to call the clinic with any problems, questions or concerns.      Cammie Sickle, MD 05/19/2016 9:39 AM

## 2016-05-20 DIAGNOSIS — E1122 Type 2 diabetes mellitus with diabetic chronic kidney disease: Secondary | ICD-10-CM | POA: Diagnosis not present

## 2016-05-20 DIAGNOSIS — Z6841 Body Mass Index (BMI) 40.0 and over, adult: Secondary | ICD-10-CM | POA: Diagnosis not present

## 2016-05-20 DIAGNOSIS — N183 Chronic kidney disease, stage 3 (moderate): Secondary | ICD-10-CM | POA: Diagnosis not present

## 2016-05-20 DIAGNOSIS — Z79899 Other long term (current) drug therapy: Secondary | ICD-10-CM | POA: Diagnosis not present

## 2016-05-20 DIAGNOSIS — I10 Essential (primary) hypertension: Secondary | ICD-10-CM | POA: Diagnosis not present

## 2016-05-20 DIAGNOSIS — G4733 Obstructive sleep apnea (adult) (pediatric): Secondary | ICD-10-CM | POA: Diagnosis not present

## 2016-05-20 DIAGNOSIS — E1142 Type 2 diabetes mellitus with diabetic polyneuropathy: Secondary | ICD-10-CM | POA: Diagnosis not present

## 2016-05-20 DIAGNOSIS — E782 Mixed hyperlipidemia: Secondary | ICD-10-CM | POA: Diagnosis not present

## 2016-05-20 DIAGNOSIS — Z9989 Dependence on other enabling machines and devices: Secondary | ICD-10-CM | POA: Diagnosis not present

## 2016-06-01 DIAGNOSIS — G4733 Obstructive sleep apnea (adult) (pediatric): Secondary | ICD-10-CM | POA: Diagnosis not present

## 2016-06-20 DIAGNOSIS — G4733 Obstructive sleep apnea (adult) (pediatric): Secondary | ICD-10-CM | POA: Diagnosis not present

## 2016-06-23 ENCOUNTER — Ambulatory Visit (INDEPENDENT_AMBULATORY_CARE_PROVIDER_SITE_OTHER): Payer: Medicare HMO | Admitting: Vascular Surgery

## 2016-06-23 ENCOUNTER — Encounter (INDEPENDENT_AMBULATORY_CARE_PROVIDER_SITE_OTHER): Payer: Self-pay | Admitting: Vascular Surgery

## 2016-06-23 VITALS — BP 145/65 | HR 69 | Resp 17 | Ht 64.0 in | Wt 253.0 lb

## 2016-06-23 DIAGNOSIS — N183 Chronic kidney disease, stage 3 unspecified: Secondary | ICD-10-CM

## 2016-06-23 DIAGNOSIS — I83893 Varicose veins of bilateral lower extremities with other complications: Secondary | ICD-10-CM

## 2016-06-23 DIAGNOSIS — D631 Anemia in chronic kidney disease: Secondary | ICD-10-CM | POA: Diagnosis not present

## 2016-06-23 DIAGNOSIS — M7989 Other specified soft tissue disorders: Secondary | ICD-10-CM | POA: Diagnosis not present

## 2016-06-23 DIAGNOSIS — E1122 Type 2 diabetes mellitus with diabetic chronic kidney disease: Secondary | ICD-10-CM | POA: Insufficient documentation

## 2016-06-23 NOTE — Assessment & Plan Note (Signed)
Likely contributing to her leg swelling.

## 2016-06-23 NOTE — Progress Notes (Signed)
MRN : [id number]  SHAWNDELL Bright is a 76 y.o. (12/03/1940) female who presents with chief complaint of  Chief Complaint  Patient presents with  . Re-evaluation    3 month follow up  .  History of Present Illness: Patient returns today in follow up of leg swelling and venous insufficiency. She continues to do pretty well. Her left leg swelling has remained reasonable after laser ablation about 4-5 months ago. She is actually having more swelling in her right leg now that she has her left leg. It is currently manageable with compression stockings but has become more noticeable. She has previous ulceration but no current ulcerations and the improved leg swelling has helped avoid problematic recurrent ulcerations. She denies fever or chills or signs of infection.  Current Outpatient Prescriptions  Medication Sig Dispense Refill  . albuterol (PROVENTIL HFA;VENTOLIN HFA) 108 (90 Base) MCG/ACT inhaler Inhale into the lungs.    Marland Kitchen amLODipine (NORVASC) 5 MG tablet Take by mouth.    . calcitRIOL (ROCALTROL) 0.25 MCG capsule     . furosemide (LASIX) 40 MG tablet     . gabapentin (NEURONTIN) 100 MG capsule     . gemfibrozil (LOPID) 600 MG tablet     . glimepiride (AMARYL) 1 MG tablet     . latanoprost (XALATAN) 0.005 % ophthalmic solution     . losartan (COZAAR) 50 MG tablet Take by mouth.    . Multiple Vitamins-Minerals (CENTRUM SILVER) tablet Take by mouth.    . Omega-3 Fatty Acids (FISH OIL) 1000 MG CAPS Take by mouth.    Marland Kitchen omeprazole (PRILOSEC) 20 MG capsule     . pioglitazone (ACTOS) 30 MG tablet Take by mouth.    . timolol (TIMOPTIC-XR) 0.5 % ophthalmic gel-forming Apply to eye.    . TRADJENTA 5 MG TABS tablet      No current facility-administered medications for this visit.         Past Medical History:  Diagnosis Date  . Adenomatous colon polyp   . Anemia   . Cataract   . Cervical stenosis of spinal canal   . Chronic kidney  disease   . Diabetes mellitus without complication ([place])   . GERD (gastroesophageal reflux disease)   . Glaucoma   . History of UTI   . Hx of gallstones   . Hyperkalemia   . Hyperlipidemia   . Hypertension   . Lumbar stenosis   . Neuropathy associated with endocrine disorder ([place])   . OSA on CPAP   . Osteoarthritis   . Osteoarthritis   . Retinopathy due to secondary diabetes ([place])   . Secondary hyperparathyroidism of renal origin [facility])          Past Surgical History:  Procedure Laterality Date  . ACDF X2    . BACK SURGERY    . Cataract extraction right    . catarect extraction left    . CHOLECYSTECTOMY    . colonoscopy with removal lesions by snare    . Endoscoic carpal tunnel release    . Laminectomy posterior lumbar facetectomy and formaninotomy w/Decomp    . Laminectony posterior cervicle decomp w/Facectomy and foraminotomy    . VEIN LIGATION AND STRIPPING Left     Social History     Social History  Substance Use Topics  . Smoking status: Never Smoker  . Smokeless tobacco: Never Used  . Alcohol use No     Family History      Family History  Problem Relation  Age of Onset  . Coronary artery disease Mother   . Diabetes type II Mother   . Hypertension Mother   . Tuberculosis Father   . Stroke Father   . Diabetes type II Sister   . Migraines Sister   . Alcohol abuse Brother   . Coronary artery disease Brother   . Diabetes type II Brother   . Kidney cancer Brother   . Kidney disease Brother   . Heart attack Brother   . Hypertension Brother           Allergies  Allergen Reactions  . Hydrocodone-Acetaminophen Other (See Comments)    Sedation and GI upset, pt is not allergic to acetaminophen  . Statins Other (See Comments)     REVIEW OF SYSTEMS (Negative unless checked)  Constitutional: _0 Weight loss  _1 Fever  _2 Chills Cardiac: _3 Chest pain   _4 Chest pressure    _5 Palpitations   _6 Shortness of breath when laying flat   _7 Shortness of breath at rest   _8 Shortness of breath with exertion. Vascular:  _9 Pain in legs with walking   _10 Pain in legs at rest   _11 Pain in legs when laying flat   _12 Claudication   _13 Pain in feet when walking  _14 Pain in feet at rest  _15 Pain in feet when laying flat   _16 History of DVT   _17 Phlebitis   _18 Swelling in legs   _19 Varicose veins   _20 Non-healing ulcers Pulmonary:   _21 Uses home oxygen   _22 Productive cough   _23 Hemoptysis   _24 Wheeze  _25 COPD   _26 Asthma Neurologic:  _27 Dizziness  _28 Blackouts   _29 Seizures   _30 History of stroke   _31 History of TIA  _32 Aphasia   _33 Temporary blindness   _34 Dysphagia   _35 Weakness or numbness in arms   _36 Weakness or numbness in legs Musculoskeletal:  _37 Arthritis   _38 Joint swelling   _39 Joint pain   _40 Low back pain Hematologic:  _41 Easy bruising  _42 Easy bleeding   _43 Hypercoagulable state   _44 Anemic   Gastrointestinal:  _45 Blood in stool   _46 Vomiting blood  _47 Gastroesophageal reflux/heartburn   _48 Abdominal pain Genitourinary:  _49 Chronic kidney disease   _50 Difficult urination  _51 Frequent urination  _52 Burning with urination   _53 Hematuria Skin:  _54 Rashes   _55 Ulcers   _56 Wounds Psychological:  _57 History of anxiety   _58  History of major depression.  Physical Examination  BP (!) 145/65 (BP Location: Left Arm)   Pulse 69   Resp 17   Ht _59  (1.626 m)   Wt 114.8 kg (253 lb)   BMI 43.43 kg/m  Gen:  WD/WN, NAD. Obese Head: [place]/AT, No temporalis wasting. Ear/Nose/Throat: Hearing grossly intact, nares w/o erythema or drainage, trachea midline Eyes: Conjunctiva clear. Sclera non-icteric Neck: Supple.  No JVD.  Pulmonary:  Good air movement, no use of accessory muscles.  Cardiac: RRR, normal S1, S2 Vascular:  Vessel Right Left  Radial Palpable Palpable  Ulnar Palpable Palpable  Brachial Palpable Palpable  Carotid Palpable, without bruit Palpable, without bruit  Aorta Not palpable N/A  Femoral Palpable Palpable   Popliteal Palpable Palpable  PT Not Palpable Trace Palpable  DP 1+ Palpable 1+ Palpable   Gastrointestinal: soft, non-tender/non-distended.  Musculoskeletal: M/S 5/5 throughout.  No deformity or atrophy. 2+ right lower extremity edema, 1+ left lower extremity edema. Neurologic: Sensation grossly intact in extremities.  Symmetrical.  Speech is fluent.  Psychiatric: Judgment intact, Mood & affect appropriate for pt's clinical situation. Dermatologic: No rashes or ulcers noted.  No cellulitis or open wounds.       Labs Recent Results (  from the past 2160 hour(s))  CBC with Differential     Status: Abnormal   Collection Time: 05/12/16  8:53 AM  Result Value Ref Range   WBC 4.6 3.6 - 11.0 K/uL   RBC 3.17 (L) 3.80 - 5.20 MIL/uL   Hemoglobin 9.8 (L) 12.0 - 16.0 g/dL   HCT 30.4 (L) 35.0 - 47.0 %   MCV 96.1 80.0 - 100.0 fL   MCH 31.0 26.0 - 34.0 pg   MCHC 32.3 32.0 - 36.0 g/dL   RDW 14.9 (H) 11.5 - 14.5 %   Platelets 173 150 - 440 K/uL   Neutrophils Relative % 56 %   Neutro Abs 2.6 1.4 - 6.5 K/uL   Lymphocytes Relative 28 %   Lymphs Abs 1.3 1.0 - 3.6 K/uL   Monocytes Relative 12 %   Monocytes Absolute 0.6 0.2 - 0.9 K/uL   Eosinophils Relative 3 %   Eosinophils Absolute 0.2 0 - 0.7 K/uL   Basophils Relative 1 %   Basophils Absolute 0.1 0 - 0.1 K/uL  Basic metabolic panel     Status: Abnormal   Collection Time: 05/12/16  8:53 AM  Result Value Ref Range   Sodium 135 135 - 145 mmol/L   Potassium 4.5 3.5 - 5.1 mmol/L   Chloride 101 101 - 111 mmol/L   CO2 24 22 - 32 mmol/L   Glucose, Bld 114 (H) 65 - 99 mg/dL   BUN 43 (H) 6 - 20 mg/dL   Creatinine, Ser 1.60 (H) 0.44 - 1.00 mg/dL   Calcium 9.8 8.9 - 10.3 mg/dL   GFR calc non Af Amer 30 (L) >60 mL/min   GFR calc Af Amer 35 (L) >60 mL/min    Comment: (NOTE) The eGFR has been calculated using the CKD EPI equation. This calculation has not been validated in all clinical situations. eGFR's persistently <60 mL/min signify possible  Chronic Kidney Disease.    Anion gap 10 5 - 15  Ferritin     Status: Abnormal   Collection Time: 05/12/16  8:53 AM  Result Value Ref Range   Ferritin 651 (H) 11 - 307 ng/mL  Iron and TIBC     Status: None   Collection Time: 05/12/16  8:53 AM  Result Value Ref Range   Iron 75 28 - 170 ug/dL   TIBC 334 250 - 450 ug/dL   Saturation Ratios 23 10.4 - 31.8 %   UIBC 259 ug/dL    Radiology No results found.    Assessment/Plan  CKD (chronic kidney disease) stage 3, GFR 30-59 ml/min Likely contributing to her leg swelling.  Swelling of limb Left leg improved after laser ablation. Right leg swelling is becoming more noticeable. I'm going to just plan a repeat duplex in about 6 months with no current intervention at this point. She will contact my office with problems in the interim.  Varicose veins of both legs with edema Left leg improved after laser ablation. Right leg swelling is becoming more noticeable. I'm going to just plan a repeat duplex in about 6 months with no current intervention at this point. She will contact my office with problems in the interim.    Leotis Pain, MD  06/23/2016 9:54 AM    This note was created with Dragon medical transcription system.  Any errors from dictation are purely unintentional

## 2016-06-23 NOTE — Assessment & Plan Note (Signed)
Left leg improved after laser ablation. Right leg swelling is becoming more noticeable. I'm going to just plan a repeat duplex in about 6 months with no current intervention at this point. She will contact my office with problems in the interim.

## 2016-07-02 DIAGNOSIS — G4733 Obstructive sleep apnea (adult) (pediatric): Secondary | ICD-10-CM | POA: Diagnosis not present

## 2016-07-20 DIAGNOSIS — G4733 Obstructive sleep apnea (adult) (pediatric): Secondary | ICD-10-CM | POA: Diagnosis not present

## 2016-07-22 DIAGNOSIS — R0609 Other forms of dyspnea: Secondary | ICD-10-CM | POA: Insufficient documentation

## 2016-07-22 DIAGNOSIS — Z6841 Body Mass Index (BMI) 40.0 and over, adult: Secondary | ICD-10-CM | POA: Diagnosis not present

## 2016-07-22 DIAGNOSIS — D638 Anemia in other chronic diseases classified elsewhere: Secondary | ICD-10-CM | POA: Diagnosis not present

## 2016-07-22 DIAGNOSIS — R69 Illness, unspecified: Secondary | ICD-10-CM | POA: Diagnosis not present

## 2016-07-27 DIAGNOSIS — I10 Essential (primary) hypertension: Secondary | ICD-10-CM | POA: Diagnosis not present

## 2016-07-27 DIAGNOSIS — Z9989 Dependence on other enabling machines and devices: Secondary | ICD-10-CM | POA: Diagnosis not present

## 2016-07-27 DIAGNOSIS — N183 Chronic kidney disease, stage 3 (moderate): Secondary | ICD-10-CM | POA: Diagnosis not present

## 2016-07-27 DIAGNOSIS — E1122 Type 2 diabetes mellitus with diabetic chronic kidney disease: Secondary | ICD-10-CM | POA: Diagnosis not present

## 2016-07-27 DIAGNOSIS — G4733 Obstructive sleep apnea (adult) (pediatric): Secondary | ICD-10-CM | POA: Diagnosis not present

## 2016-07-27 DIAGNOSIS — E782 Mixed hyperlipidemia: Secondary | ICD-10-CM | POA: Diagnosis not present

## 2016-07-27 DIAGNOSIS — R0609 Other forms of dyspnea: Secondary | ICD-10-CM | POA: Diagnosis not present

## 2016-07-31 DIAGNOSIS — D500 Iron deficiency anemia secondary to blood loss (chronic): Secondary | ICD-10-CM | POA: Diagnosis not present

## 2016-08-04 DIAGNOSIS — G4733 Obstructive sleep apnea (adult) (pediatric): Secondary | ICD-10-CM | POA: Diagnosis not present

## 2016-08-12 ENCOUNTER — Other Ambulatory Visit: Payer: Self-pay | Admitting: Specialist

## 2016-08-12 DIAGNOSIS — R0602 Shortness of breath: Secondary | ICD-10-CM | POA: Diagnosis not present

## 2016-08-12 DIAGNOSIS — E663 Overweight: Secondary | ICD-10-CM | POA: Diagnosis not present

## 2016-08-12 DIAGNOSIS — I272 Pulmonary hypertension, unspecified: Secondary | ICD-10-CM

## 2016-08-12 DIAGNOSIS — J849 Interstitial pulmonary disease, unspecified: Secondary | ICD-10-CM | POA: Diagnosis not present

## 2016-08-12 DIAGNOSIS — G4733 Obstructive sleep apnea (adult) (pediatric): Secondary | ICD-10-CM | POA: Diagnosis not present

## 2016-08-14 ENCOUNTER — Encounter: Payer: Self-pay | Admitting: Dietician

## 2016-08-14 ENCOUNTER — Encounter: Payer: Medicare HMO | Attending: Nurse Practitioner | Admitting: Dietician

## 2016-08-14 VITALS — Ht 64.0 in | Wt 254.4 lb

## 2016-08-14 DIAGNOSIS — E1122 Type 2 diabetes mellitus with diabetic chronic kidney disease: Secondary | ICD-10-CM

## 2016-08-14 DIAGNOSIS — N183 Chronic kidney disease, stage 3 (moderate): Secondary | ICD-10-CM

## 2016-08-14 DIAGNOSIS — E119 Type 2 diabetes mellitus without complications: Secondary | ICD-10-CM | POA: Insufficient documentation

## 2016-08-14 DIAGNOSIS — Z6841 Body Mass Index (BMI) 40.0 and over, adult: Secondary | ICD-10-CM

## 2016-08-14 NOTE — Patient Instructions (Signed)
   Keep up your exercise, great job so far!  Keep working to eat something every 4 hours during the day.   Good meal schedule = pre-gym snack, brunch, afternoon snack, supper.   Alternate schedule = breakfast, lunch, supper, bedtime snack.   If supper is more than 2 hours before bedtime, eat a bedtime snack of crackers with peanut butter or cheese, or fruit and peanut butter and cheese.

## 2016-08-14 NOTE — Progress Notes (Signed)
Medical Nutrition Therapy: Visit start time: 0900  end time: 1030  Assessment:  Diagnosis: Diabetes, obesity, CKD Stage 3 Past medical history: HTN, HLD, GERD, sleep apnea  Psychosocial issues/ stress concerns: none Preferred learning method:  . Auditory . Visual  Current weight: 254.4lbs with shoes   Height: 5'4" Medications, supplements: reconciled list in medical record  Progress and evaluation: Patient reports some weight fluctuation according to time of day. She feels she has lost a few pounds in the last 2-3 weeks due to some diet changes, eating less overall. She reports not often feeling hungry, and often misses meals or snacks because she forgets to eat. She has been on potassium restriction due to high blood potassium (5.2 on 07/22/16), also iron deficiency due to Stage 3 CKD. She reports experiencing some low BGs especially early in the morning when she forgets to eat a snack the night before.    Physical activity: bicycle and weights at gym 2-3 times a week for 60 minutes  Dietary Intake:  Usual eating pattern includes 2 meals and 1-2 snacks per day. Dining out frequency: 5 meals per week.  Breakfast:7-9-10am instant oatmeal, flavored; bacon or sausage with eggs; cheese toast; occasionally pancakes.  Snack: none Lunch: usually skips, not hungry; occasionally fast food burger ($1-2) Snack: chips or popsicle, or [place] biscuits with juice, coffee or water Supper: 5-8 or 9pm -- vegetables, pork chop/ chicken/ fish/ burger (baked, fried, broiled); hot dog fridays with chili and slaw.  Snack: sometimes crackers with cheese or peanut butter; Belvita biscuits; apple with peanut butter when supper is early Beverages: water some with sugar free flavoring, juice, coffee; occasional diet soda or sweet tea with lemon (lots of ice), once a week or less.   Nutrition Care Education: Topics covered: weight control , diabetes, renal disease Basic nutrition: basic food groups, appropriate  nutrient balance, appropriate meal and snack schedule, general nutrition guidelines    Weight control: behavioral changes for weight loss -- importance of portion control, low fat and low sugar foods, quick and easy meal options, benefits of regular exercise, basic meal planning using plate method and food models. Diabetes: appropriate meal and snack schedule, appropriate carb intake and balance, eating small amounts more often when appetite is poor, role of protein in preventing low blood sugar and importance of eating at regular intervals. Renal disease: High potassium foods to avoid/ limit -- advised no more than 1 serving of high potassium food daily; low potassium foods; label reading for sodium and goal of average 500mg  or less per meal; high sodium foods to limit/ avoid.    Nutritional Diagnosis:  [place]-2.1 Inpaired nutrition utilization and [place]-2.2 Altered nutrition-related laboratory As related to chronic kidney disease, diabetes.  As evidenced by lab reports, MD notes, patient report. [place]-3.3 Overweight/obesity As related to excess calories and inactivity.  As evidenced by BMI 43.7, patient report.  Intervention: Instruction as noted above.   Patient requested help with easy meal options, as she does not have much interest in, or strength for cooking.   Set goals with input from patient.   Education Materials given:  . Plate Planner with food lists . Sample meal pattern/ menus-- Quick and Healthy Meal Ideas . Renal disease food pyramid ([place]) . Goals/ instructions  Learner/ who was taught:  . Patient   Level of understanding: Marland Kitchen Verbalizes/ demonstrates competency  Demonstrated degree of understanding via:   Teach back Learning barriers: . None  Willingness to learn/ readiness for change: . Eager, change in  progress   Monitoring and Evaluation:  Dietary intake, exercise, BG control, renal function, and body weight      follow up: 09/18/16

## 2016-08-19 DIAGNOSIS — R0609 Other forms of dyspnea: Secondary | ICD-10-CM | POA: Diagnosis not present

## 2016-08-20 DIAGNOSIS — G4733 Obstructive sleep apnea (adult) (pediatric): Secondary | ICD-10-CM | POA: Diagnosis not present

## 2016-08-21 DIAGNOSIS — D509 Iron deficiency anemia, unspecified: Secondary | ICD-10-CM | POA: Diagnosis not present

## 2016-08-24 ENCOUNTER — Inpatient Hospital Stay: Payer: Medicare HMO

## 2016-08-24 ENCOUNTER — Inpatient Hospital Stay: Payer: Medicare HMO | Admitting: Oncology

## 2016-08-24 DIAGNOSIS — N183 Chronic kidney disease, stage 3 (moderate): Secondary | ICD-10-CM | POA: Diagnosis not present

## 2016-08-24 DIAGNOSIS — G4733 Obstructive sleep apnea (adult) (pediatric): Secondary | ICD-10-CM | POA: Diagnosis not present

## 2016-08-24 DIAGNOSIS — Z9989 Dependence on other enabling machines and devices: Secondary | ICD-10-CM | POA: Diagnosis not present

## 2016-08-24 DIAGNOSIS — R0609 Other forms of dyspnea: Secondary | ICD-10-CM | POA: Diagnosis not present

## 2016-08-24 DIAGNOSIS — I10 Essential (primary) hypertension: Secondary | ICD-10-CM | POA: Diagnosis not present

## 2016-08-24 DIAGNOSIS — E782 Mixed hyperlipidemia: Secondary | ICD-10-CM | POA: Diagnosis not present

## 2016-08-24 DIAGNOSIS — E1122 Type 2 diabetes mellitus with diabetic chronic kidney disease: Secondary | ICD-10-CM | POA: Diagnosis not present

## 2016-08-24 NOTE — Progress Notes (Deleted)
Hematology/Oncology Consult note [facility] [place]  [phone] [phone]  Patient Care Team: Sallee Lange, NP as PCP - General (Internal Medicine)   Name of the patient: Sara Bright  [id number]  [birth year]   Date of visit: 08/24/16  Diagnosis- anemia of chronic kidney disease  Chief complaint/ Reason for visit- routine f/u  Heme/Onc history: Patient is a 76 yr old female with a h/o anemia of chronic kindey disease who is on oral iron. She has not yet been started on aranesp. On 06/03/2011 for an upper endoscopy revealed esophagitis, gastritis. Pathology showed oxyntic mucosa and intestinal metaplasia with atrophy in the stomach. Colonoscopy On 06/03/2011 revealed medium sized internal hemorrhoids and adenomatous polyps. Recent CBC on 08/21/2016 showed white count of 5, H&H of 8.9/28 point with an MCV of 100.7 and platelet count of 178. Iron studies showed elevated ferritin of 179. Iron saturation was normal at 21% TIBC was normal at 341 and serum iron was normal at 70. Her MCV in the past has always been high normal   Interval history- ***   Review of systems- Review of Systems  Constitutional: Negative for chills, fever, malaise/fatigue and weight loss.  HENT: Negative for congestion, ear discharge and nosebleeds.   Eyes: Negative for blurred vision.  Respiratory: Negative for cough, hemoptysis, sputum production, shortness of breath and wheezing.   Cardiovascular: Negative for chest pain, palpitations, orthopnea and claudication.  Gastrointestinal: Negative for abdominal pain, blood in stool, constipation, diarrhea, heartburn, melena, nausea and vomiting.  Genitourinary: Negative for dysuria, flank pain, frequency, hematuria and urgency.  Musculoskeletal: Negative for back pain, joint pain and myalgias.  Skin: Negative for rash.  Neurological: Negative for dizziness, tingling, focal weakness, seizures, weakness and headaches.    Endo/Heme/Allergies: Does not bruise/bleed easily.  Psychiatric/Behavioral: Negative for depression and suicidal ideas. The patient does not have insomnia.       Allergies  Allergen Reactions  . Hydrocodone-Acetaminophen Other (See Comments)    Sedation and GI upset, pt is not allergic to acetaminophen  . Statins Other (See Comments)     Past Medical History:  Diagnosis Date  . Adenomatous colon polyp   . Anemia   . Cataract   . Cervical stenosis of spinal canal   . Chronic kidney disease   . Diabetes mellitus without complication ([place])   . GERD (gastroesophageal reflux disease)   . Glaucoma   . History of UTI   . Hx of gallstones   . Hyperkalemia   . Hyperlipidemia   . Hypertension   . Lumbar stenosis   . Neuropathy associated with endocrine disorder ([place])   . OSA on CPAP   . Osteoarthritis   . Osteoarthritis   . Retinopathy due to secondary diabetes ([place])   . Secondary hyperparathyroidism of renal origin [facility])      Past Surgical History:  Procedure Laterality Date  . ACDF X2    . BACK SURGERY    . Cataract extraction right    . catarect extraction left    . CHOLECYSTECTOMY    . colonoscopy with removal lesions by snare    . Endoscoic carpal tunnel release    . Laminectomy posterior lumbar facetectomy and formaninotomy w/Decomp    . Laminectony posterior cervicle decomp w/Facectomy and foraminotomy    . VEIN LIGATION AND STRIPPING Left     Social History   Social History  . Marital status: Married    Spouse name: N/A  . Number of children: N/A  .  Years of education: N/A   Occupational History  . Not on file.   Social History Main Topics  . Smoking status: Never Smoker  . Smokeless tobacco: Never Used  . Alcohol use No  . Drug use: No  . Sexual activity: Not on file   Other Topics Concern  . Not on file   Social History Narrative  . No narrative on file    Family History  Problem Relation Age of Onset  . Coronary artery disease Mother    . Diabetes type II Mother   . Hypertension Mother   . Tuberculosis Father   . Stroke Father   . Diabetes type II Sister   . Migraines Sister   . Alcohol abuse Brother   . Coronary artery disease Brother   . Diabetes type II Brother   . Kidney cancer Brother   . Kidney disease Brother   . Heart attack Brother   . Hypertension Brother      Current Outpatient Prescriptions:  .  acetaminophen (TYLENOL) 500 MG tablet, Take by mouth., Disp: , Rfl:  .  albuterol (PROVENTIL HFA;VENTOLIN HFA) 108 (90 Base) MCG/ACT inhaler, Inhale into the lungs., Disp: , Rfl:  .  amLODipine (NORVASC) 5 MG tablet, Take 5 mg by mouth daily. , Disp: , Rfl:  .  aspirin EC 81 MG tablet, Take 81 mg by mouth daily., Disp: , Rfl:  .  ASSURE COMFORT LANCETS 30G MISC, , Disp: , Rfl:  .  calcitRIOL (ROCALTROL) 0.25 MCG capsule, Take 0.25 mcg by mouth 3 (three) times a week. , Disp: , Rfl:  .  carvedilol (COREG) 6.25 MG tablet, Take 6.25 mg by mouth 2 (two) times daily., Disp: , Rfl: 12 .  ferrous sulfate 325 (65 FE) MG tablet, Take 325 mg by mouth daily., Disp: , Rfl:  .  fluticasone furoate-vilanterol (BREO ELLIPTA) 100-25 MCG/INH AEPB, Inhale into the lungs., Disp: , Rfl:  .  furosemide (LASIX) 40 MG tablet, Take 40 mg by mouth daily. , Disp: , Rfl:  .  gabapentin (NEURONTIN) 100 MG capsule, Take 100 mg by mouth 2 (two) times daily. , Disp: , Rfl:  .  gemfibrozil (LOPID) 600 MG tablet, Take 600 mg by mouth daily. , Disp: , Rfl:  .  glimepiride (AMARYL) 1 MG tablet, Take 1 mg by mouth daily with breakfast. , Disp: , Rfl:  .  Lancet Devices (ADJUSTABLE LANCING DEVICE) MISC, , Disp: , Rfl:  .  latanoprost (XALATAN) 0.005 % ophthalmic solution, Place 1 drop into both eyes. , Disp: , Rfl:  .  losartan (COZAAR) 50 MG tablet, Take 100 mg by mouth daily. , Disp: , Rfl:  .  Multiple Vitamins-Minerals (CENTRUM SILVER) tablet, Take 1 tablet by mouth daily. , Disp: , Rfl:  .  Omega-3 Fatty Acids (FISH OIL) 1000 MG CAPS, Take  1,000 mg by mouth daily. , Disp: , Rfl:  .  omeprazole (PRILOSEC) 20 MG capsule, Take 20 mg by mouth daily. , Disp: , Rfl:  .  ONE TOUCH ULTRA TEST test strip, , Disp: , Rfl:  .  pioglitazone (ACTOS) 30 MG tablet, Take 30 mg by mouth daily. , Disp: , Rfl:  .  timolol (TIMOPTIC-XR) 0.5 % ophthalmic gel-forming, Place 1 drop into both eyes. , Disp: , Rfl:  .  TRADJENTA 5 MG TABS tablet, Take 5 mg by mouth daily. , Disp: , Rfl:   Physical exam: There were no vitals filed for this visit. Physical Exam  Constitutional: She  is oriented to person, place, and time and well-developed, well-nourished, and in no distress.  HENT:  Head: Normocephalic and atraumatic.  Eyes: EOM are normal. Pupils are equal, round, and reactive to light.  Neck: Normal range of motion.  Cardiovascular: Normal rate, regular rhythm and normal heart sounds.   Pulmonary/Chest: Effort normal and breath sounds normal.  Abdominal: Soft. Bowel sounds are normal.  Neurological: She is alert and oriented to person, place, and time.  Skin: Skin is warm and dry.     CMP Latest Ref Rng & Units 05/12/2016  Glucose 65 - 99 mg/dL 114(H)  BUN 6 - 20 mg/dL 43(H)  Creatinine 0.44 - 1.00 mg/dL 1.60(H)  Sodium 135 - 145 mmol/L 135  Potassium 3.5 - 5.1 mmol/L 4.5  Chloride 101 - 111 mmol/L 101  CO2 22 - 32 mmol/L 24  Calcium 8.9 - 10.3 mg/dL 9.8  Total Protein 6.5 - 8.1 g/dL -  Total Bilirubin 0.3 - 1.2 mg/dL -  Alkaline Phos 38 - 126 U/L -  AST 15 - 41 U/L -  ALT 14 - 54 U/L -   CBC Latest Ref Rng & Units 05/12/2016  WBC 3.6 - 11.0 K/uL 4.6  Hemoglobin 12.0 - 16.0 g/dL 9.8(L)  Hematocrit 35.0 - 47.0 % 30.4(L)  Platelets 150 - 440 K/uL 173     Assessment and plan- Patient is a 76 y.o. female with anemia of chronic kidney disease  1. Given that she has macrocytic anemia, I will check B12, folate and tSH today. Recent iron studies normal. Will plan to start procrit at 10000 units per week to keep Hb between 101-11 and hold  procrit if hb >10. She will rtc in 1 month to see Dr. Rogue Bussing. I discussed risks and benefits of procrit including all but not limited to risk of heart attacks and strokes at higher levels of hb. Patient understands and agrees to proceed as planned   Visit Diagnosis 1. Anemia due to stage 3 chronic kidney disease      Dr. Randa Evens, MD, MPH [facility] Pager- [phone] 08/24/2016 8:13 AM

## 2016-08-25 DIAGNOSIS — D631 Anemia in chronic kidney disease: Secondary | ICD-10-CM | POA: Diagnosis not present

## 2016-08-25 DIAGNOSIS — I10 Essential (primary) hypertension: Secondary | ICD-10-CM | POA: Diagnosis not present

## 2016-08-25 DIAGNOSIS — N183 Chronic kidney disease, stage 3 (moderate): Secondary | ICD-10-CM | POA: Diagnosis not present

## 2016-08-25 DIAGNOSIS — E1122 Type 2 diabetes mellitus with diabetic chronic kidney disease: Secondary | ICD-10-CM | POA: Diagnosis not present

## 2016-08-25 DIAGNOSIS — N2581 Secondary hyperparathyroidism of renal origin: Secondary | ICD-10-CM | POA: Diagnosis not present

## 2016-08-31 ENCOUNTER — Ambulatory Visit: Payer: Medicare HMO | Admitting: Oncology

## 2016-09-01 ENCOUNTER — Other Ambulatory Visit: Payer: Self-pay | Admitting: *Deleted

## 2016-09-01 ENCOUNTER — Inpatient Hospital Stay: Payer: Medicare HMO

## 2016-09-01 ENCOUNTER — Telehealth: Payer: Self-pay | Admitting: Internal Medicine

## 2016-09-01 ENCOUNTER — Inpatient Hospital Stay: Payer: Medicare HMO | Attending: Oncology | Admitting: Internal Medicine

## 2016-09-01 ENCOUNTER — Ambulatory Visit
Admission: RE | Admit: 2016-09-01 | Discharge: 2016-09-01 | Disposition: A | Discharge status: Home / Self Care | Payer: Medicare HMO | Source: Ambulatory Visit | Attending: Specialist | Admitting: Specialist

## 2016-09-01 VITALS — BP 136/72 | HR 61 | Temp 96.7°F | Resp 16 | Wt 251.2 lb

## 2016-09-01 DIAGNOSIS — N183 Chronic kidney disease, stage 3 unspecified: Secondary | ICD-10-CM

## 2016-09-01 DIAGNOSIS — D649 Anemia, unspecified: Secondary | ICD-10-CM

## 2016-09-01 DIAGNOSIS — Z86010 Personal history of colonic polyps: Secondary | ICD-10-CM | POA: Insufficient documentation

## 2016-09-01 DIAGNOSIS — E875 Hyperkalemia: Secondary | ICD-10-CM | POA: Diagnosis not present

## 2016-09-01 DIAGNOSIS — I700 Atherosclerosis of aorta: Secondary | ICD-10-CM | POA: Diagnosis not present

## 2016-09-01 DIAGNOSIS — E785 Hyperlipidemia, unspecified: Secondary | ICD-10-CM | POA: Insufficient documentation

## 2016-09-01 DIAGNOSIS — R0602 Shortness of breath: Secondary | ICD-10-CM

## 2016-09-01 DIAGNOSIS — K219 Gastro-esophageal reflux disease without esophagitis: Secondary | ICD-10-CM | POA: Diagnosis not present

## 2016-09-01 DIAGNOSIS — R911 Solitary pulmonary nodule: Secondary | ICD-10-CM | POA: Diagnosis not present

## 2016-09-01 DIAGNOSIS — J849 Interstitial pulmonary disease, unspecified: Secondary | ICD-10-CM | POA: Diagnosis present

## 2016-09-01 DIAGNOSIS — M48061 Spinal stenosis, lumbar region without neurogenic claudication: Secondary | ICD-10-CM | POA: Diagnosis not present

## 2016-09-01 DIAGNOSIS — E11319 Type 2 diabetes mellitus with unspecified diabetic retinopathy without macular edema: Secondary | ICD-10-CM

## 2016-09-01 DIAGNOSIS — D631 Anemia in chronic kidney disease: Secondary | ICD-10-CM

## 2016-09-01 DIAGNOSIS — Z8051 Family history of malignant neoplasm of kidney: Secondary | ICD-10-CM | POA: Insufficient documentation

## 2016-09-01 DIAGNOSIS — Z7984 Long term (current) use of oral hypoglycemic drugs: Secondary | ICD-10-CM

## 2016-09-01 DIAGNOSIS — Z87442 Personal history of urinary calculi: Secondary | ICD-10-CM

## 2016-09-01 DIAGNOSIS — Z7982 Long term (current) use of aspirin: Secondary | ICD-10-CM | POA: Diagnosis not present

## 2016-09-01 DIAGNOSIS — I272 Pulmonary hypertension, unspecified: Secondary | ICD-10-CM | POA: Diagnosis present

## 2016-09-01 DIAGNOSIS — Z79899 Other long term (current) drug therapy: Secondary | ICD-10-CM | POA: Insufficient documentation

## 2016-09-01 DIAGNOSIS — G4733 Obstructive sleep apnea (adult) (pediatric): Secondary | ICD-10-CM | POA: Diagnosis not present

## 2016-09-01 DIAGNOSIS — I313 Pericardial effusion (noninflammatory): Secondary | ICD-10-CM | POA: Diagnosis not present

## 2016-09-01 DIAGNOSIS — N2581 Secondary hyperparathyroidism of renal origin: Secondary | ICD-10-CM | POA: Diagnosis not present

## 2016-09-01 DIAGNOSIS — I129 Hypertensive chronic kidney disease with stage 1 through stage 4 chronic kidney disease, or unspecified chronic kidney disease: Secondary | ICD-10-CM | POA: Diagnosis not present

## 2016-09-01 DIAGNOSIS — M1990 Unspecified osteoarthritis, unspecified site: Secondary | ICD-10-CM

## 2016-09-01 DIAGNOSIS — E119 Type 2 diabetes mellitus without complications: Secondary | ICD-10-CM | POA: Insufficient documentation

## 2016-09-01 LAB — SAMPLE TO BLOOD BANK

## 2016-09-01 LAB — COMPREHENSIVE METABOLIC PANEL
ALT: 19 U/L (ref 14–54)
ANION GAP: 9 (ref 5–15)
AST: 26 U/L (ref 15–41)
Albumin: 3.5 g/dL (ref 3.5–5.0)
Alkaline Phosphatase: 79 U/L (ref 38–126)
BILIRUBIN TOTAL: 0.6 mg/dL (ref 0.3–1.2)
BUN: 37 mg/dL — AB (ref 6–20)
CHLORIDE: 106 mmol/L (ref 101–111)
CO2: 23 mmol/L (ref 22–32)
Calcium: 9.9 mg/dL (ref 8.9–10.3)
Creatinine, Ser: 1.51 mg/dL — ABNORMAL HIGH (ref 0.44–1.00)
GFR, EST AFRICAN AMERICAN: 37 mL/min — AB (ref >60)
GFR, EST NON AFRICAN AMERICAN: 32 mL/min — AB (ref >60)
Glucose, Bld: 69 mg/dL (ref 65–99)
POTASSIUM: 4.7 mmol/L (ref 3.5–5.1)
Sodium: 138 mmol/L (ref 135–145)
TOTAL PROTEIN: 7 g/dL (ref 6.5–8.1)

## 2016-09-01 LAB — CBC WITH DIFFERENTIAL/PLATELET
BASOS ABS: 0.1 10*3/uL (ref 0–0.1)
Basophils Relative: 1 %
EOS PCT: 5 %
Eosinophils Absolute: 0.2 10*3/uL (ref 0–0.7)
HEMATOCRIT: 28.9 % — AB (ref 35.0–47.0)
Hemoglobin: 9.5 g/dL — ABNORMAL LOW (ref 12.0–16.0)
Lymphocytes Relative: 32 %
Lymphs Abs: 1.5 10*3/uL (ref 1.0–3.6)
MCH: 31.6 pg (ref 26.0–34.0)
MCHC: 32.8 g/dL (ref 32.0–36.0)
MCV: 96.2 fL (ref 80.0–100.0)
MONO ABS: 0.5 10*3/uL (ref 0.2–0.9)
MONOS PCT: 11 %
Neutro Abs: 2.3 10*3/uL (ref 1.4–6.5)
Neutrophils Relative %: 51 %
PLATELETS: 155 10*3/uL (ref 150–440)
RBC: 3.01 MIL/uL — ABNORMAL LOW (ref 3.80–5.20)
RDW: 14.1 % (ref 11.5–14.5)
WBC: 4.6 10*3/uL (ref 3.6–11.0)

## 2016-09-01 LAB — IRON AND TIBC
Iron: 67 ug/dL (ref 28–170)
SATURATION RATIOS: 20 % (ref 10.4–31.8)
TIBC: 333 ug/dL (ref 250–450)
UIBC: 266 ug/dL

## 2016-09-01 LAB — FERRITIN: Ferritin: 467 ng/mL — ABNORMAL HIGH (ref 11–307)

## 2016-09-01 NOTE — Progress Notes (Signed)
Patient here today for follow up.  Patient states no new concerns today  

## 2016-09-01 NOTE — Progress Notes (Signed)
[place] OFFICE PROGRESS NOTE  Patient Care Team: Gauger, Victoriano Lain, NP as PCP - General (Internal Medicine)  Cancer Staging No matching staging information was found for the patient.  # IRON DEF ANEMIA/ CKD [Dr.Lateef; Dr.Gittin] sep 2017- Stool/UA/Myeloma work up-NEG;colo- 2010 [per pt] Sep 2017- IV venofer; June 2018- start aranesp  # CKD-III [1.7]; Dr.Lateef   No history exists.     INTERVAL HISTORY:  Sara Bright 76 y.o.  female pleasant patient above history of Chronic anemia/chronic kidney disease; is here for follow-up.  Patient complains of worsening shortness of breath in the last few months. Worse with exertion. Denies and chest pain. Patient is currently on iron pills twice a day. She denies any significant weight gain or swelling in the legs.    Denies any constipation. Denies any blood in stools.  No nausea no vomiting.  REVIEW OF SYSTEMS:  A complete 10 point review of system is done which is negative except mentioned above/history of present illness.   PAST MEDICAL HISTORY :  Past Medical History:  Diagnosis Date  . Adenomatous colon polyp   . Anemia   . Cataract   . Cervical stenosis of spinal canal   . Chronic kidney disease   . Diabetes mellitus without complication ([place])   . GERD (gastroesophageal reflux disease)   . Glaucoma   . History of UTI   . Hx of gallstones   . Hyperkalemia   . Hyperlipidemia   . Hypertension   . Lumbar stenosis   . Neuropathy associated with endocrine disorder ([place])   . OSA on CPAP   . Osteoarthritis   . Osteoarthritis   . Retinopathy due to secondary diabetes ([place])   . Secondary hyperparathyroidism of renal origin [facility])     PAST SURGICAL HISTORY :   Past Surgical History:  Procedure Laterality Date  . ACDF X2    . BACK SURGERY    . Cataract extraction right    . catarect extraction left    . CHOLECYSTECTOMY    . colonoscopy with removal lesions by snare    . Endoscoic carpal tunnel  release    . Laminectomy posterior lumbar facetectomy and formaninotomy w/Decomp    . Laminectony posterior cervicle decomp w/Facectomy and foraminotomy    . VEIN LIGATION AND STRIPPING Left     FAMILY HISTORY :   Family History  Problem Relation Age of Onset  . Coronary artery disease Mother   . Diabetes type II Mother   . Hypertension Mother   . Tuberculosis Father   . Stroke Father   . Diabetes type II Sister   . Migraines Sister   . Alcohol abuse Brother   . Coronary artery disease Brother   . Diabetes type II Brother   . Kidney cancer Brother   . Kidney disease Brother   . Heart attack Brother   . Hypertension Brother     SOCIAL HISTORY:   Social History  Substance Use Topics  . Smoking status: Never Smoker  . Smokeless tobacco: Never Used  . Alcohol use No    ALLERGIES:  is allergic to hydrocodone-acetaminophen and statins.  MEDICATIONS:  Current Outpatient Prescriptions  Medication Sig Dispense Refill  . acetaminophen (TYLENOL) 500 MG tablet Take by mouth.    Marland Kitchen albuterol (PROVENTIL HFA;VENTOLIN HFA) 108 (90 Base) MCG/ACT inhaler Inhale into the lungs.    Marland Kitchen amLODipine (NORVASC) 5 MG tablet Take 5 mg by mouth daily.     Marland Kitchen aspirin EC  81 MG tablet Take 81 mg by mouth daily.    . calcitRIOL (ROCALTROL) 0.25 MCG capsule Take 0.25 mcg by mouth 3 (three) times a week.     . carvedilol (COREG) 6.25 MG tablet Take 6.25 mg by mouth 2 (two) times daily.  12  . ferrous sulfate 325 (65 FE) MG tablet Take 325 mg by mouth daily.    . furosemide (LASIX) 40 MG tablet Take 40 mg by mouth daily.     Marland Kitchen gabapentin (NEURONTIN) 100 MG capsule Take 100 mg by mouth 2 (two) times daily.     Marland Kitchen gemfibrozil (LOPID) 600 MG tablet Take 600 mg by mouth daily.     Marland Kitchen glimepiride (AMARYL) 1 MG tablet Take 1 mg by mouth daily with breakfast.     . latanoprost (XALATAN) 0.005 % ophthalmic solution Place 1 drop into both eyes.     Marland Kitchen losartan (COZAAR) 50 MG tablet Take 100 mg by mouth daily.     .  Multiple Vitamins-Minerals (CENTRUM SILVER) tablet Take 1 tablet by mouth daily.     . Omega-3 Fatty Acids (FISH OIL) 1000 MG CAPS Take 1,000 mg by mouth daily.     Marland Kitchen omeprazole (PRILOSEC) 20 MG capsule Take 20 mg by mouth daily.     . timolol (TIMOPTIC-XR) 0.5 % ophthalmic gel-forming Place 1 drop into both eyes.     . TRADJENTA 5 MG TABS tablet Take 5 mg by mouth daily.     . ASSURE COMFORT LANCETS 30G MISC     . Lancet Devices (ADJUSTABLE LANCING DEVICE) MISC     . ONE TOUCH ULTRA TEST test strip     . pioglitazone (ACTOS) 30 MG tablet Take 30 mg by mouth daily.      No current facility-administered medications for this visit.     PHYSICAL EXAMINATION:  BP 136/72 (BP Location: Left Arm, Patient Position: Sitting)   Pulse 61   Temp (!) 96.7 F (35.9 C) (Tympanic)   Resp 16   Wt 251 lb 3.4 oz (114 kg)   BMI 43.12 kg/m   Filed Weights   09/01/16 1413  Weight: 251 lb 3.4 oz (114 kg)    GENERAL: Well-nourished well-developed; Alert, no distress and comfortable. Alone.  EYES: no pallor or icterus OROPHARYNX: no thrush or ulceration; good dentition  NECK: supple, no masses felt LYMPH:  no palpable lymphadenopathy in the cervical, axillary or inguinal regions LUNGS: clear to auscultation and  No wheeze or crackles HEART/CVS: regular rate & rhythm and no murmurs; No lower extremity edema ABDOMEN:abdomen soft, non-tender and normal bowel sounds Musculoskeletal:no cyanosis of digits and no clubbing  PSYCH: alert & oriented x 3 with fluent speech NEURO: no focal motor/sensory deficits SKIN:  no rashes or significant lesions  LABORATORY DATA:  I have reviewed the data as listed    Component Value Date/Time   NA 138 09/01/2016 1352   NA 137 10/23/2012 2335   K 4.7 09/01/2016 1352   K 4.4 10/23/2012 2335   CL 106 09/01/2016 1352   CL 107 10/23/2012 2335   CO2 23 09/01/2016 1352   CO2 26 10/23/2012 2335   GLUCOSE 69 09/01/2016 1352   GLUCOSE 179 (H) 10/23/2012 2335   BUN 37  (H) 09/01/2016 1352   BUN 21 (H) 10/23/2012 2335   CREATININE 1.51 (H) 09/01/2016 1352   CREATININE 1.23 10/23/2012 2335   CALCIUM 9.9 09/01/2016 1352   CALCIUM 10.3 (H) 10/23/2012 2335   PROT 7.0 09/01/2016 1352  PROT 7.4 06/16/2011 1144   ALBUMIN 3.5 09/01/2016 1352   ALBUMIN 3.5 06/16/2011 1144   AST 26 09/01/2016 1352   AST 22 08/18/2011 1026   ALT 19 09/01/2016 1352   ALT 63 06/16/2011 1144   ALKPHOS 79 09/01/2016 1352   ALKPHOS 98 06/16/2011 1144   BILITOT 0.6 09/01/2016 1352   BILITOT 0.4 06/16/2011 1144   GFRNONAA 32 (L) 09/01/2016 1352   GFRNONAA 43 (L) 10/23/2012 2335   GFRAA 37 (L) 09/01/2016 1352   GFRAA 50 (L) 10/23/2012 2335    No results found for: SPEP, UPEP  Lab Results  Component Value Date   WBC 4.6 09/01/2016   NEUTROABS 2.3 09/01/2016   HGB 9.5 (L) 09/01/2016   HCT 28.9 (L) 09/01/2016   MCV 96.2 09/01/2016   PLT 155 09/01/2016      Chemistry      Component Value Date/Time   NA 138 09/01/2016 1352   NA 137 10/23/2012 2335   K 4.7 09/01/2016 1352   K 4.4 10/23/2012 2335   CL 106 09/01/2016 1352   CL 107 10/23/2012 2335   CO2 23 09/01/2016 1352   CO2 26 10/23/2012 2335   BUN 37 (H) 09/01/2016 1352   BUN 21 (H) 10/23/2012 2335   CREATININE 1.51 (H) 09/01/2016 1352   CREATININE 1.23 10/23/2012 2335      Component Value Date/Time   CALCIUM 9.9 09/01/2016 1352   CALCIUM 10.3 (H) 10/23/2012 2335   ALKPHOS 79 09/01/2016 1352   ALKPHOS 98 06/16/2011 1144   AST 26 09/01/2016 1352   AST 22 08/18/2011 1026   ALT 19 09/01/2016 1352   ALT 63 06/16/2011 1144   BILITOT 0.6 09/01/2016 1352   BILITOT 0.4 06/16/2011 1144       RADIOGRAPHIC STUDIES: I have personally reviewed the radiological images as listed and agreed with the findings in the report. Ct Chest Wo Contrast  Result Date: 09/01/2016 CLINICAL DATA:  Short of breath for 1 month EXAM: CT CHEST WITHOUT CONTRAST TECHNIQUE: Multidetector CT imaging of the chest was performed following  the standard protocol without IV contrast. COMPARISON:  Chest radiograph 01/12/2010 FINDINGS: Cardiovascular: Coronary artery calcification and aortic atherosclerotic calcification. Mediastinum/Nodes: No axillary or supraclavicular adenopathy. No mediastinal hilar adenopathy. No pericardial fluid. Lungs/Pleura: No ground-glass opacities. No architectural distortion. No bronchiectasis. No nodularity. Inspiratory and expiratory imaging demonstrates no air trapping. Solitary smudgy nodule in the RIGHT lower lobe measures 3 mm (image 71, series 3). Upper Abdomen: Limited view of the liver, kidneys, pancreas are unremarkable. Normal adrenal glands. Musculoskeletal: No aggressive osseous lesion IMPRESSION: 1. Trace pericardial effusion. 2. No evidence of interstitial lung disease. 3. Small RIGHT lower lobe nodule. No follow-up needed if patient is low-risk. Non-contrast chest CT can be considered in 12 months if patient is high-risk. This recommendation follows the consensus statement: Guidelines for Management of Incidental Pulmonary Nodules Detected on CT Images: From the Fleischner Society 2017; Radiology 2017; 284:228-243. 4.  Aortic Atherosclerosis (ICD10-I70.0). Electronically Signed   By: Suzy Bouchard M.D.   On: 09/01/2016 16:45     ASSESSMENT & PLAN:  Anemia due to stage 3 chronic kidney disease # IDA/ CKD-hemoglobin today is 9.5;  Patient currently taking iron 65 mg BID. Ferritin- today is 467 saturation 20%. Patient will continue taking by mouth iron. Given her difficulty breathing/shortness of breath on exertion- I would recommend starting Aranesp every 4 weeks.   # Discussed with the patient at lenghth the mechanism of action of Aranesp; and also  the potential side effects including but not limited to- elevated blood pressure and also thromboembolic events especially if the hemoglobin greater than 12/13. The goal Hemoglobin would be around 10.  # right lower lobe 3 mm lung nodule- noted on CT scan  from today [through PCP for worsening shortness of breath]. Defer follow up with PCP.   # Start Aranesp as soon as possible; monthly H&H/possible Aranesp;   # plan follow up in 3 months; labs- 1 week prior/possible venofer.   CC; Dr.Lateef/ PCP.   Orders Placed This Encounter  Procedures  . CBC with Differential/Platelet    Standing Status:   Future    Standing Expiration Date:   09/01/2017  . Basic metabolic panel    Standing Status:   Future    Standing Expiration Date:   09/01/2017  . Ferritin    Standing Status:   Future    Standing Expiration Date:   09/01/2017  . Iron and TIBC    Standing Status:   Future    Standing Expiration Date:   09/01/2017   All questions were answered. The patient knows to call the clinic with any problems, questions or concerns.      Cammie Sickle, MD 09/01/2016 5:27 PM

## 2016-09-01 NOTE — Assessment & Plan Note (Addendum)
#   IDA/ CKD-hemoglobin today is 9.5;  Patient currently taking iron 65 mg BID. Ferritin- today is 467 saturation 20%. Patient will continue taking by mouth iron. Given her difficulty breathing/shortness of breath on exertion- I would recommend starting Aranesp every 4 weeks.   # Discussed with the patient at lenghth the mechanism of action of Aranesp; and also the potential side effects including but not limited to- elevated blood pressure and also thromboembolic events especially if the hemoglobin greater than 12/13. The goal Hemoglobin would be around 10.  # right lower lobe 3 mm lung nodule- noted on CT scan from today [through PCP for worsening shortness of breath]. Defer follow up with PCP.   # Start Aranesp as soon as possible; monthly H&H/possible Aranesp;   # plan follow up in 3 months; labs- 1 week prior/possible venofer.   CC; Dr.Lateef/ PCP.

## 2016-09-01 NOTE — Telephone Encounter (Signed)
Please inform patient that I recommend Starting Aranesp as soon as possible [orders in the computer]; please order monthly H&H/possible Aranesp; follow-up with me is planned in 3 months.

## 2016-09-02 ENCOUNTER — Inpatient Hospital Stay: Payer: Medicare HMO

## 2016-09-02 VITALS — BP 153/71 | HR 60 | Temp 97.1°F | Resp 20

## 2016-09-02 DIAGNOSIS — D631 Anemia in chronic kidney disease: Secondary | ICD-10-CM

## 2016-09-02 DIAGNOSIS — I129 Hypertensive chronic kidney disease with stage 1 through stage 4 chronic kidney disease, or unspecified chronic kidney disease: Secondary | ICD-10-CM | POA: Diagnosis not present

## 2016-09-02 DIAGNOSIS — N183 Chronic kidney disease, stage 3 (moderate): Principal | ICD-10-CM

## 2016-09-02 MED ORDER — DARBEPOETIN ALFA 200 MCG/0.4ML IJ SOSY
200.0000 ug | PREFILLED_SYRINGE | Freq: Once | INTRAMUSCULAR | Status: AC
  Administered 2016-09-02: 200 ug via SUBCUTANEOUS
  Filled 2016-09-02: qty 0.4

## 2016-09-02 NOTE — Patient Instructions (Signed)

## 2016-09-02 NOTE — Telephone Encounter (Signed)
Left vm for patient to return my phone call. Per Shirlean Mylar in scheduling, pt is aware of apts times.

## 2016-09-02 NOTE — Telephone Encounter (Signed)
msg sent to Robin in [place]. Team to arrange apts and let me know when apts are scheduled. I will call pt.

## 2016-09-04 DIAGNOSIS — G4733 Obstructive sleep apnea (adult) (pediatric): Secondary | ICD-10-CM | POA: Diagnosis not present

## 2016-09-08 ENCOUNTER — Encounter: Payer: Medicare HMO | Attending: Nurse Practitioner | Admitting: Dietician

## 2016-09-08 ENCOUNTER — Encounter: Payer: Self-pay | Admitting: Dietician

## 2016-09-08 ENCOUNTER — Other Ambulatory Visit: Payer: Medicare HMO

## 2016-09-08 VITALS — Ht 64.0 in | Wt 250.6 lb

## 2016-09-08 DIAGNOSIS — N183 Chronic kidney disease, stage 3 (moderate): Secondary | ICD-10-CM

## 2016-09-08 DIAGNOSIS — E669 Obesity, unspecified: Secondary | ICD-10-CM | POA: Diagnosis not present

## 2016-09-08 DIAGNOSIS — E119 Type 2 diabetes mellitus without complications: Secondary | ICD-10-CM | POA: Diagnosis not present

## 2016-09-08 DIAGNOSIS — Z6841 Body Mass Index (BMI) 40.0 and over, adult: Secondary | ICD-10-CM | POA: Diagnosis not present

## 2016-09-08 DIAGNOSIS — E1122 Type 2 diabetes mellitus with diabetic chronic kidney disease: Secondary | ICD-10-CM

## 2016-09-08 NOTE — Progress Notes (Signed)
Medical Nutrition Therapy: Visit start time: 9244  end time: 1100  Assessment:  Diagnosis: diabetes, obesity Medical history changes: no changes per patient; had iron infusion last week Psychosocial issues/ stress concerns: none  Current weight: 250.6lbs  Height: 5'4" Medications, supplement changes: no changes per patient  Progress and evaluation: weight loss of about 4lbs since 08/14/16. She is eaitng more regularly. Tests fasting BGs at home, with recent readings of 96, 80s; during the day in low 100s. She states her daughter is providing support for her in working on diet changes. Physical activity: gym bicycle and weights 60 minutes, 2-3 times a week.  Dietary Intake:  Usual eating pattern includes 2-3 meals and 1 snacks per day. Dining out frequency: 1 meals per week.  Breakfast: sausage or egg with oatmeal; grilled ham and cheese sandwich Snack: none Lunch: if Breakfast is early, tomato or cucumber sandwich on (partially)whole wheat; bologna sandwich; none or small snack if breakfast is later. Snack: none Supper: vegetables with lean meat pork chop, ground beef patty, baked pan fried, chicken Snack: peanut butter crackers 2-3 Beverages: water, flavored water, soda maybe once a week  Nutrition Care Education: Topics covered: diabetes, weight management Basic nutrition: reviewed appropriate nutrient balance, appropriate meal and snack schedule    Weight control: reviewed patient's eating pattern, importance of adequate nutrition, role of exercise Diabetes: appropriate meal and snack schedule, importance of adequate protein intake; sick day guidelines for poor appetite Other: reviewed recent lab results (09/01/16 -- potassium 4.7) with patient    Nutritional Diagnosis:  [place]-2.2 Altered nutrition-related laboratory As related to CKD, diabetes.  As evidenced by lab reports, patient report. [place]-3.3 Overweight/obesity As related to history of excess calories, inactivity.  As evidenced by  BMI 43, patient report.  Intervention: Discussion as noted above.   Updated goals with input from patient.    She declined further follow-up at this time; will plan to call her in 4-8 weeks to check on progress.  Education Materials given:  Marland Kitchen Sample meal pattern/ menus . Goals/ instructions  Learner/ who was taught:  . Patient   Level of understanding: Marland Kitchen Verbalizes/ demonstrates competency  Demonstrated degree of understanding via:   Teach back Learning barriers: . None  Willingness to learn/ readiness for change: . Eager, change in progress  Monitoring and Evaluation:  Dietary intake, exercise, BG control, renal function status, and body weight      follow up: prn

## 2016-09-08 NOTE — Patient Instructions (Signed)
   On days your appetite is poor, eat small amounts of light foods every 3 hours, such as soup, crackers with cheese or peanut butter, fruit and cheese.   If you can't eat, drink something nourishing such as a Glucerna drink, or premier protein. Sip on a chilled drink over 20-30 minutes, several times a day.   You can think about adding some unflavored protein powder into juice, or soup or broth, or applesauce or yogurt.

## 2016-09-18 ENCOUNTER — Ambulatory Visit: Payer: Medicare HMO | Admitting: Dietician

## 2016-09-18 ENCOUNTER — Other Ambulatory Visit: Payer: Medicare HMO

## 2016-09-19 DIAGNOSIS — G4733 Obstructive sleep apnea (adult) (pediatric): Secondary | ICD-10-CM | POA: Diagnosis not present

## 2016-09-22 ENCOUNTER — Ambulatory Visit: Payer: Medicare HMO | Admitting: Internal Medicine

## 2016-09-22 ENCOUNTER — Ambulatory Visit: Payer: Medicare HMO

## 2016-09-23 DIAGNOSIS — G4733 Obstructive sleep apnea (adult) (pediatric): Secondary | ICD-10-CM | POA: Diagnosis not present

## 2016-09-23 DIAGNOSIS — R911 Solitary pulmonary nodule: Secondary | ICD-10-CM | POA: Diagnosis not present

## 2016-09-23 DIAGNOSIS — R0609 Other forms of dyspnea: Secondary | ICD-10-CM | POA: Diagnosis not present

## 2016-09-23 DIAGNOSIS — I272 Pulmonary hypertension, unspecified: Secondary | ICD-10-CM | POA: Diagnosis not present

## 2016-09-23 DIAGNOSIS — Z9989 Dependence on other enabling machines and devices: Secondary | ICD-10-CM | POA: Diagnosis not present

## 2016-09-30 ENCOUNTER — Inpatient Hospital Stay: Payer: Medicare HMO | Attending: Internal Medicine

## 2016-09-30 ENCOUNTER — Other Ambulatory Visit: Payer: Self-pay | Admitting: *Deleted

## 2016-09-30 ENCOUNTER — Inpatient Hospital Stay: Payer: Medicare HMO

## 2016-09-30 DIAGNOSIS — D631 Anemia in chronic kidney disease: Secondary | ICD-10-CM | POA: Diagnosis not present

## 2016-09-30 DIAGNOSIS — I129 Hypertensive chronic kidney disease with stage 1 through stage 4 chronic kidney disease, or unspecified chronic kidney disease: Secondary | ICD-10-CM | POA: Insufficient documentation

## 2016-09-30 DIAGNOSIS — N183 Chronic kidney disease, stage 3 unspecified: Secondary | ICD-10-CM

## 2016-09-30 LAB — HEMATOCRIT: HCT: 31.2 % — ABNORMAL LOW (ref 35.0–47.0)

## 2016-09-30 LAB — HEMOGLOBIN: Hemoglobin: 10.2 g/dL — ABNORMAL LOW (ref 12.0–16.0)

## 2016-10-05 DIAGNOSIS — G4733 Obstructive sleep apnea (adult) (pediatric): Secondary | ICD-10-CM | POA: Diagnosis not present

## 2016-10-16 DIAGNOSIS — M79674 Pain in right toe(s): Secondary | ICD-10-CM | POA: Diagnosis not present

## 2016-10-16 DIAGNOSIS — E119 Type 2 diabetes mellitus without complications: Secondary | ICD-10-CM | POA: Diagnosis not present

## 2016-10-16 DIAGNOSIS — M7732 Calcaneal spur, left foot: Secondary | ICD-10-CM | POA: Diagnosis not present

## 2016-10-16 DIAGNOSIS — M722 Plantar fascial fibromatosis: Secondary | ICD-10-CM | POA: Diagnosis not present

## 2016-10-16 DIAGNOSIS — M79672 Pain in left foot: Secondary | ICD-10-CM | POA: Diagnosis not present

## 2016-10-16 DIAGNOSIS — M79675 Pain in left toe(s): Secondary | ICD-10-CM | POA: Diagnosis not present

## 2016-10-16 DIAGNOSIS — B351 Tinea unguium: Secondary | ICD-10-CM | POA: Diagnosis not present

## 2016-10-20 DIAGNOSIS — G4733 Obstructive sleep apnea (adult) (pediatric): Secondary | ICD-10-CM | POA: Diagnosis not present

## 2016-10-22 DIAGNOSIS — N183 Chronic kidney disease, stage 3 (moderate): Secondary | ICD-10-CM | POA: Diagnosis not present

## 2016-10-22 DIAGNOSIS — D509 Iron deficiency anemia, unspecified: Secondary | ICD-10-CM | POA: Diagnosis not present

## 2016-10-22 DIAGNOSIS — D631 Anemia in chronic kidney disease: Secondary | ICD-10-CM | POA: Diagnosis not present

## 2016-10-22 DIAGNOSIS — K219 Gastro-esophageal reflux disease without esophagitis: Secondary | ICD-10-CM | POA: Diagnosis not present

## 2016-10-22 DIAGNOSIS — Z86010 Personal history of colonic polyps: Secondary | ICD-10-CM | POA: Diagnosis not present

## 2016-10-22 DIAGNOSIS — R195 Other fecal abnormalities: Secondary | ICD-10-CM | POA: Diagnosis not present

## 2016-10-23 ENCOUNTER — Telehealth: Payer: Self-pay | Admitting: Dietician

## 2016-10-23 DIAGNOSIS — M62830 Muscle spasm of back: Secondary | ICD-10-CM | POA: Diagnosis not present

## 2016-10-23 DIAGNOSIS — M5136 Other intervertebral disc degeneration, lumbar region: Secondary | ICD-10-CM | POA: Diagnosis not present

## 2016-10-23 DIAGNOSIS — M1711 Unilateral primary osteoarthritis, right knee: Secondary | ICD-10-CM | POA: Diagnosis not present

## 2016-10-23 DIAGNOSIS — M2351 Chronic instability of knee, right knee: Secondary | ICD-10-CM | POA: Diagnosis not present

## 2016-10-23 DIAGNOSIS — M2352 Chronic instability of knee, left knee: Secondary | ICD-10-CM | POA: Diagnosis not present

## 2016-10-23 DIAGNOSIS — M1712 Unilateral primary osteoarthritis, left knee: Secondary | ICD-10-CM | POA: Diagnosis not present

## 2016-10-23 DIAGNOSIS — G5602 Carpal tunnel syndrome, left upper limb: Secondary | ICD-10-CM | POA: Diagnosis not present

## 2016-10-23 NOTE — Telephone Encounter (Signed)
Called patient to check on progress with diet. She reports doing well, had medical appointment yesterday and received a good report. She reports some weight fluctuation, within 7lbs. Her appetite has been good recently, she is snacking on crackers and cheese when not very hungry for a meal.  Encouraged patient to call with any questions or concerns, or if additional RD follow up is needed.

## 2016-10-26 DIAGNOSIS — R300 Dysuria: Secondary | ICD-10-CM | POA: Diagnosis not present

## 2016-10-26 DIAGNOSIS — N3000 Acute cystitis without hematuria: Secondary | ICD-10-CM | POA: Diagnosis not present

## 2016-10-28 ENCOUNTER — Inpatient Hospital Stay: Payer: Medicare HMO | Attending: Internal Medicine

## 2016-10-28 ENCOUNTER — Inpatient Hospital Stay: Payer: Medicare HMO

## 2016-10-28 DIAGNOSIS — D631 Anemia in chronic kidney disease: Secondary | ICD-10-CM

## 2016-10-28 DIAGNOSIS — N183 Chronic kidney disease, stage 3 unspecified: Secondary | ICD-10-CM

## 2016-10-28 DIAGNOSIS — D509 Iron deficiency anemia, unspecified: Secondary | ICD-10-CM | POA: Insufficient documentation

## 2016-10-28 LAB — HEMOGLOBIN: Hemoglobin: 10.2 g/dL — ABNORMAL LOW (ref 12.0–16.0)

## 2016-10-28 LAB — HEMATOCRIT: HCT: 30.7 % — ABNORMAL LOW (ref 35.0–47.0)

## 2016-11-05 DIAGNOSIS — G4733 Obstructive sleep apnea (adult) (pediatric): Secondary | ICD-10-CM | POA: Diagnosis not present

## 2016-11-10 DIAGNOSIS — H401111 Primary open-angle glaucoma, right eye, mild stage: Secondary | ICD-10-CM | POA: Diagnosis not present

## 2016-11-16 DIAGNOSIS — E119 Type 2 diabetes mellitus without complications: Secondary | ICD-10-CM | POA: Diagnosis not present

## 2016-11-18 DIAGNOSIS — E1122 Type 2 diabetes mellitus with diabetic chronic kidney disease: Secondary | ICD-10-CM | POA: Diagnosis not present

## 2016-11-18 DIAGNOSIS — I10 Essential (primary) hypertension: Secondary | ICD-10-CM | POA: Diagnosis not present

## 2016-11-18 DIAGNOSIS — Z79899 Other long term (current) drug therapy: Secondary | ICD-10-CM | POA: Diagnosis not present

## 2016-11-18 DIAGNOSIS — N183 Chronic kidney disease, stage 3 (moderate): Secondary | ICD-10-CM | POA: Diagnosis not present

## 2016-11-18 DIAGNOSIS — E1142 Type 2 diabetes mellitus with diabetic polyneuropathy: Secondary | ICD-10-CM | POA: Diagnosis not present

## 2016-11-18 DIAGNOSIS — R69 Illness, unspecified: Secondary | ICD-10-CM | POA: Diagnosis not present

## 2016-11-18 DIAGNOSIS — E782 Mixed hyperlipidemia: Secondary | ICD-10-CM | POA: Diagnosis not present

## 2016-11-18 DIAGNOSIS — Z23 Encounter for immunization: Secondary | ICD-10-CM | POA: Diagnosis not present

## 2016-11-19 ENCOUNTER — Inpatient Hospital Stay: Payer: Medicare HMO | Attending: Internal Medicine

## 2016-11-19 DIAGNOSIS — N183 Chronic kidney disease, stage 3 unspecified: Secondary | ICD-10-CM

## 2016-11-19 DIAGNOSIS — M1990 Unspecified osteoarthritis, unspecified site: Secondary | ICD-10-CM | POA: Diagnosis not present

## 2016-11-19 DIAGNOSIS — R0602 Shortness of breath: Secondary | ICD-10-CM | POA: Insufficient documentation

## 2016-11-19 DIAGNOSIS — E119 Type 2 diabetes mellitus without complications: Secondary | ICD-10-CM | POA: Diagnosis not present

## 2016-11-19 DIAGNOSIS — Z86010 Personal history of colonic polyps: Secondary | ICD-10-CM | POA: Diagnosis not present

## 2016-11-19 DIAGNOSIS — Z7984 Long term (current) use of oral hypoglycemic drugs: Secondary | ICD-10-CM | POA: Diagnosis not present

## 2016-11-19 DIAGNOSIS — E11319 Type 2 diabetes mellitus with unspecified diabetic retinopathy without macular edema: Secondary | ICD-10-CM | POA: Diagnosis not present

## 2016-11-19 DIAGNOSIS — E875 Hyperkalemia: Secondary | ICD-10-CM | POA: Diagnosis not present

## 2016-11-19 DIAGNOSIS — I129 Hypertensive chronic kidney disease with stage 1 through stage 4 chronic kidney disease, or unspecified chronic kidney disease: Secondary | ICD-10-CM | POA: Diagnosis present

## 2016-11-19 DIAGNOSIS — N2581 Secondary hyperparathyroidism of renal origin: Secondary | ICD-10-CM | POA: Insufficient documentation

## 2016-11-19 DIAGNOSIS — D631 Anemia in chronic kidney disease: Secondary | ICD-10-CM | POA: Insufficient documentation

## 2016-11-19 DIAGNOSIS — Z7982 Long term (current) use of aspirin: Secondary | ICD-10-CM | POA: Insufficient documentation

## 2016-11-19 DIAGNOSIS — D509 Iron deficiency anemia, unspecified: Secondary | ICD-10-CM | POA: Diagnosis not present

## 2016-11-19 DIAGNOSIS — Z8051 Family history of malignant neoplasm of kidney: Secondary | ICD-10-CM | POA: Insufficient documentation

## 2016-11-19 DIAGNOSIS — R918 Other nonspecific abnormal finding of lung field: Secondary | ICD-10-CM | POA: Diagnosis not present

## 2016-11-19 DIAGNOSIS — G4733 Obstructive sleep apnea (adult) (pediatric): Secondary | ICD-10-CM | POA: Insufficient documentation

## 2016-11-19 DIAGNOSIS — M4802 Spinal stenosis, cervical region: Secondary | ICD-10-CM | POA: Insufficient documentation

## 2016-11-19 DIAGNOSIS — M48061 Spinal stenosis, lumbar region without neurogenic claudication: Secondary | ICD-10-CM | POA: Insufficient documentation

## 2016-11-19 DIAGNOSIS — E785 Hyperlipidemia, unspecified: Secondary | ICD-10-CM | POA: Diagnosis not present

## 2016-11-19 DIAGNOSIS — Z87442 Personal history of urinary calculi: Secondary | ICD-10-CM | POA: Diagnosis not present

## 2016-11-19 DIAGNOSIS — K219 Gastro-esophageal reflux disease without esophagitis: Secondary | ICD-10-CM | POA: Diagnosis not present

## 2016-11-19 DIAGNOSIS — Z79899 Other long term (current) drug therapy: Secondary | ICD-10-CM | POA: Insufficient documentation

## 2016-11-19 LAB — BASIC METABOLIC PANEL
Anion gap: 8 (ref 5–15)
BUN: 53 mg/dL — AB (ref 6–20)
CHLORIDE: 106 mmol/L (ref 101–111)
CO2: 24 mmol/L (ref 22–32)
Calcium: 9.8 mg/dL (ref 8.9–10.3)
Creatinine, Ser: 2 mg/dL — ABNORMAL HIGH (ref 0.44–1.00)
GFR calc non Af Amer: 23 mL/min — ABNORMAL LOW (ref >60)
GFR, EST AFRICAN AMERICAN: 27 mL/min — AB (ref >60)
Glucose, Bld: 92 mg/dL (ref 65–99)
POTASSIUM: 5.2 mmol/L — AB (ref 3.5–5.1)
Sodium: 138 mmol/L (ref 135–145)

## 2016-11-19 LAB — CBC WITH DIFFERENTIAL/PLATELET
Basophils Absolute: 0 10*3/uL (ref 0–0.1)
Basophils Relative: 1 %
Eosinophils Absolute: 0.2 10*3/uL (ref 0–0.7)
Eosinophils Relative: 4 %
HCT: 30.4 % — ABNORMAL LOW (ref 35.0–47.0)
HEMOGLOBIN: 10.1 g/dL — AB (ref 12.0–16.0)
LYMPHS ABS: 1.3 10*3/uL (ref 1.0–3.6)
Lymphocytes Relative: 25 %
MCH: 32 pg (ref 26.0–34.0)
MCHC: 33.1 g/dL (ref 32.0–36.0)
MCV: 96.8 fL (ref 80.0–100.0)
Monocytes Absolute: 0.6 10*3/uL (ref 0.2–0.9)
Monocytes Relative: 12 %
NEUTROS ABS: 2.9 10*3/uL (ref 1.4–6.5)
NEUTROS PCT: 58 %
Platelets: 148 10*3/uL — ABNORMAL LOW (ref 150–440)
RBC: 3.14 MIL/uL — AB (ref 3.80–5.20)
RDW: 14.6 % — ABNORMAL HIGH (ref 11.5–14.5)
WBC: 5.1 10*3/uL (ref 3.6–11.0)

## 2016-11-19 LAB — IRON AND TIBC
IRON: 62 ug/dL (ref 28–170)
SATURATION RATIOS: 18 % (ref 10.4–31.8)
TIBC: 342 ug/dL (ref 250–450)
UIBC: 280 ug/dL

## 2016-11-19 LAB — FERRITIN: Ferritin: 413 ng/mL — ABNORMAL HIGH (ref 11–307)

## 2016-11-20 DIAGNOSIS — G4733 Obstructive sleep apnea (adult) (pediatric): Secondary | ICD-10-CM | POA: Diagnosis not present

## 2016-11-21 DIAGNOSIS — M7541 Impingement syndrome of right shoulder: Secondary | ICD-10-CM | POA: Diagnosis not present

## 2016-11-21 DIAGNOSIS — M6281 Muscle weakness (generalized): Secondary | ICD-10-CM | POA: Diagnosis not present

## 2016-11-21 DIAGNOSIS — M62830 Muscle spasm of back: Secondary | ICD-10-CM | POA: Diagnosis not present

## 2016-11-21 DIAGNOSIS — M545 Low back pain: Secondary | ICD-10-CM | POA: Diagnosis not present

## 2016-11-21 DIAGNOSIS — S43004A Unspecified dislocation of right shoulder joint, initial encounter: Secondary | ICD-10-CM | POA: Diagnosis not present

## 2016-11-21 DIAGNOSIS — M5137 Other intervertebral disc degeneration, lumbosacral region: Secondary | ICD-10-CM | POA: Diagnosis not present

## 2016-11-23 ENCOUNTER — Other Ambulatory Visit: Payer: Self-pay | Admitting: Internal Medicine

## 2016-11-23 DIAGNOSIS — D508 Other iron deficiency anemias: Secondary | ICD-10-CM

## 2016-11-24 ENCOUNTER — Inpatient Hospital Stay: Payer: Medicare HMO

## 2016-11-24 ENCOUNTER — Inpatient Hospital Stay (HOSPITAL_BASED_OUTPATIENT_CLINIC_OR_DEPARTMENT_OTHER): Payer: Medicare HMO | Admitting: Internal Medicine

## 2016-11-24 VITALS — BP 121/66 | HR 50 | Temp 97.6°F | Resp 19

## 2016-11-24 VITALS — BP 153/76 | HR 60 | Temp 98.0°F | Resp 16 | Wt 249.0 lb

## 2016-11-24 DIAGNOSIS — M4802 Spinal stenosis, cervical region: Secondary | ICD-10-CM | POA: Diagnosis not present

## 2016-11-24 DIAGNOSIS — R918 Other nonspecific abnormal finding of lung field: Secondary | ICD-10-CM

## 2016-11-24 DIAGNOSIS — M48061 Spinal stenosis, lumbar region without neurogenic claudication: Secondary | ICD-10-CM

## 2016-11-24 DIAGNOSIS — I129 Hypertensive chronic kidney disease with stage 1 through stage 4 chronic kidney disease, or unspecified chronic kidney disease: Secondary | ICD-10-CM

## 2016-11-24 DIAGNOSIS — D631 Anemia in chronic kidney disease: Secondary | ICD-10-CM

## 2016-11-24 DIAGNOSIS — N183 Chronic kidney disease, stage 3 unspecified: Secondary | ICD-10-CM

## 2016-11-24 DIAGNOSIS — Z79899 Other long term (current) drug therapy: Secondary | ICD-10-CM

## 2016-11-24 DIAGNOSIS — R0602 Shortness of breath: Secondary | ICD-10-CM | POA: Diagnosis not present

## 2016-11-24 DIAGNOSIS — Z87442 Personal history of urinary calculi: Secondary | ICD-10-CM

## 2016-11-24 DIAGNOSIS — Z7984 Long term (current) use of oral hypoglycemic drugs: Secondary | ICD-10-CM

## 2016-11-24 DIAGNOSIS — Z7982 Long term (current) use of aspirin: Secondary | ICD-10-CM

## 2016-11-24 DIAGNOSIS — Z8051 Family history of malignant neoplasm of kidney: Secondary | ICD-10-CM

## 2016-11-24 DIAGNOSIS — K219 Gastro-esophageal reflux disease without esophagitis: Secondary | ICD-10-CM | POA: Diagnosis not present

## 2016-11-24 DIAGNOSIS — D509 Iron deficiency anemia, unspecified: Secondary | ICD-10-CM

## 2016-11-24 DIAGNOSIS — E785 Hyperlipidemia, unspecified: Secondary | ICD-10-CM | POA: Diagnosis not present

## 2016-11-24 DIAGNOSIS — E875 Hyperkalemia: Secondary | ICD-10-CM

## 2016-11-24 DIAGNOSIS — Z86010 Personal history of colonic polyps: Secondary | ICD-10-CM

## 2016-11-24 DIAGNOSIS — E119 Type 2 diabetes mellitus without complications: Secondary | ICD-10-CM | POA: Diagnosis not present

## 2016-11-24 DIAGNOSIS — N2581 Secondary hyperparathyroidism of renal origin: Secondary | ICD-10-CM

## 2016-11-24 DIAGNOSIS — M1990 Unspecified osteoarthritis, unspecified site: Secondary | ICD-10-CM

## 2016-11-24 DIAGNOSIS — G4733 Obstructive sleep apnea (adult) (pediatric): Secondary | ICD-10-CM

## 2016-11-24 DIAGNOSIS — E11319 Type 2 diabetes mellitus with unspecified diabetic retinopathy without macular edema: Secondary | ICD-10-CM

## 2016-11-24 MED ORDER — SODIUM CHLORIDE 0.9 % IV SOLN
Freq: Once | INTRAVENOUS | Status: AC
  Administered 2016-11-24: 10:00:00 via INTRAVENOUS
  Filled 2016-11-24: qty 1000

## 2016-11-24 MED ORDER — IRON SUCROSE 20 MG/ML IV SOLN
INTRAVENOUS | Status: AC
  Filled 2016-11-24: qty 10

## 2016-11-24 MED ORDER — IRON SUCROSE 20 MG/ML IV SOLN
200.0000 mg | Freq: Once | INTRAVENOUS | Status: AC
  Administered 2016-11-24: 200 mg via INTRAVENOUS
  Filled 2016-11-24: qty 10

## 2016-11-24 NOTE — Assessment & Plan Note (Addendum)
#   IDA/ CKD-hemoglobin today is 10.3  Patient currently taking iron 65 mg BID/aranesp. Ferritin- today is 467 saturation 18%. Patient will continue taking by mouth iron.   # Goal iron saturation is 30%- I would recommend IV Venofer today. Will not need Aranesp today.   # right lower lobe 3 mm lung nodule- noted on CT scan from today [through PCP for worsening shortness of breath]. Defer follow up with PCP.   # plan follow up in 3 months; labs- 1 week prior/possible venofer.   CC; Dr.Lateef/ PCP.

## 2016-11-24 NOTE — Progress Notes (Signed)
[place] OFFICE PROGRESS NOTE  Patient Care Team: Gauger, Victoriano Lain, NP as PCP - General (Internal Medicine)  Cancer Staging No matching staging information was found for the patient.  # IRON DEF ANEMIA/ CKD [Dr.Lateef; Dr.Gittin] sep 2017- Stool/UA/Myeloma work up-NEG;colo- 2010 [per pt] Sep 2017- IV venofer; June 2018- start aranesp  # CKD-III [1.7]; Dr.Lateef   No history exists.     INTERVAL HISTORY:  Sara Bright 76 y.o.  female pleasant patient above history of Chronic anemia/chronic kidney disease; is here for follow-up.  Patient complains of worsening shortness of breath in the last few months. Worse with exertion. Denies and chest pain. Patient is currently on iron pills twice a day. She denies any significant weight gain or swelling in the legs.    REVIEW OF SYSTEMS:  A complete 10 point review of system is done which is negative except mentioned above/history of present illness.   PAST MEDICAL HISTORY :  Past Medical History:  Diagnosis Date  . Adenomatous colon polyp   . Anemia   . Cataract   . Cervical stenosis of spinal canal   . Chronic kidney disease   . Diabetes mellitus without complication ([place])   . GERD (gastroesophageal reflux disease)   . Glaucoma   . History of UTI   . Hx of gallstones   . Hyperkalemia   . Hyperlipidemia   . Hypertension   . Lumbar stenosis   . Neuropathy associated with endocrine disorder ([place])   . OSA on CPAP   . Osteoarthritis   . Osteoarthritis   . Retinopathy due to secondary diabetes ([place])   . Secondary hyperparathyroidism of renal origin [facility])     PAST SURGICAL HISTORY :   Past Surgical History:  Procedure Laterality Date  . ACDF X2    . BACK SURGERY    . Cataract extraction right    . catarect extraction left    . CHOLECYSTECTOMY    . colonoscopy with removal lesions by snare    . Endoscoic carpal tunnel release    . Laminectomy posterior lumbar facetectomy and formaninotomy w/Decomp     . Laminectony posterior cervicle decomp w/Facectomy and foraminotomy    . VEIN LIGATION AND STRIPPING Left     FAMILY HISTORY :   Family History  Problem Relation Age of Onset  . Coronary artery disease Mother   . Diabetes type II Mother   . Hypertension Mother   . Tuberculosis Father   . Stroke Father   . Diabetes type II Sister   . Migraines Sister   . Alcohol abuse Brother   . Coronary artery disease Brother   . Diabetes type II Brother   . Kidney cancer Brother   . Kidney disease Brother   . Heart attack Brother   . Hypertension Brother     SOCIAL HISTORY:   Social History  Substance Use Topics  . Smoking status: Never Smoker  . Smokeless tobacco: Never Used  . Alcohol use No    ALLERGIES:  is allergic to hydrocodone-acetaminophen and statins.  MEDICATIONS:  Current Outpatient Prescriptions  Medication Sig Dispense Refill  . acetaminophen (TYLENOL) 500 MG tablet Take by mouth.    Marland Kitchen albuterol (PROVENTIL HFA;VENTOLIN HFA) 108 (90 Base) MCG/ACT inhaler Inhale into the lungs.    Marland Kitchen amLODipine (NORVASC) 5 MG tablet Take 5 mg by mouth daily.     Marland Kitchen aspirin EC 81 MG tablet Take 81 mg by mouth daily.    Marilynne Drivers  COMFORT LANCETS 30G MISC     . calcitRIOL (ROCALTROL) 0.25 MCG capsule Take 0.25 mcg by mouth 3 (three) times a week.     . carvedilol (COREG) 6.25 MG tablet Take 6.25 mg by mouth 2 (two) times daily.  12  . ferrous sulfate 325 (65 FE) MG tablet Take 325 mg by mouth daily.    . furosemide (LASIX) 40 MG tablet Take 40 mg by mouth daily.     Marland Kitchen gabapentin (NEURONTIN) 100 MG capsule Take 100 mg by mouth 2 (two) times daily.     Marland Kitchen gemfibrozil (LOPID) 600 MG tablet Take 600 mg by mouth daily.     Marland Kitchen glimepiride (AMARYL) 1 MG tablet Take 1 mg by mouth daily with breakfast.     . Lancet Devices (ADJUSTABLE LANCING DEVICE) MISC     . latanoprost (XALATAN) 0.005 % ophthalmic solution Place 1 drop into both eyes.     Marland Kitchen losartan (COZAAR) 50 MG tablet Take 100 mg by mouth  daily.     . Multiple Vitamins-Minerals (CENTRUM SILVER) tablet Take 1 tablet by mouth daily.     . Omega-3 Fatty Acids (FISH OIL) 1000 MG CAPS Take 1,000 mg by mouth daily.     Marland Kitchen omeprazole (PRILOSEC) 20 MG capsule Take 20 mg by mouth daily.     . ONE TOUCH ULTRA TEST test strip     . timolol (TIMOPTIC-XR) 0.5 % ophthalmic gel-forming Place 1 drop into both eyes.     . TRADJENTA 5 MG TABS tablet Take 5 mg by mouth daily.     . pioglitazone (ACTOS) 30 MG tablet Take 30 mg by mouth daily.      No current facility-administered medications for this visit.     PHYSICAL EXAMINATION:  BP (!) 153/76 (BP Location: Left Arm, Patient Position: Sitting)   Pulse 60   Temp 98 F (36.7 C) (Tympanic)   Resp 16   Wt 249 lb 0.2 oz (112.9 kg)   BMI 42.74 kg/m   Filed Weights   11/24/16 0932 11/24/16 0938  Weight: 249 lb 0.2 oz (112.9 kg) 249 lb 0.2 oz (112.9 kg)    GENERAL: Well-nourished well-developed; Alert, no distress and comfortable. Alone.  EYES: no pallor or icterus OROPHARYNX: no thrush or ulceration; good dentition  NECK: supple, no masses felt LYMPH:  no palpable lymphadenopathy in the cervical, axillary or inguinal regions LUNGS: clear to auscultation and  No wheeze or crackles HEART/CVS: regular rate & rhythm and no murmurs; No lower extremity edema ABDOMEN:abdomen soft, non-tender and normal bowel sounds Musculoskeletal:no cyanosis of digits and no clubbing  PSYCH: alert & oriented x 3 with fluent speech NEURO: no focal motor/sensory deficits SKIN:  no rashes or significant lesions  LABORATORY DATA:  I have reviewed the data as listed    Component Value Date/Time   NA 138 11/19/2016 0842   NA 137 10/23/2012 2335   K 5.2 (H) 11/19/2016 0842   K 4.4 10/23/2012 2335   CL 106 11/19/2016 0842   CL 107 10/23/2012 2335   CO2 24 11/19/2016 0842   CO2 26 10/23/2012 2335   GLUCOSE 92 11/19/2016 0842   GLUCOSE 179 (H) 10/23/2012 2335   BUN 53 (H) 11/19/2016 0842   BUN 21 (H)  10/23/2012 2335   CREATININE 2.00 (H) 11/19/2016 0842   CREATININE 1.23 10/23/2012 2335   CALCIUM 9.8 11/19/2016 0842   CALCIUM 10.3 (H) 10/23/2012 2335   PROT 7.0 09/01/2016 1352   PROT 7.4 06/16/2011 1144  ALBUMIN 3.5 09/01/2016 1352   ALBUMIN 3.5 06/16/2011 1144   AST 26 09/01/2016 1352   AST 22 08/18/2011 1026   ALT 19 09/01/2016 1352   ALT 63 06/16/2011 1144   ALKPHOS 79 09/01/2016 1352   ALKPHOS 98 06/16/2011 1144   BILITOT 0.6 09/01/2016 1352   BILITOT 0.4 06/16/2011 1144   GFRNONAA 23 (L) 11/19/2016 0842   GFRNONAA 43 (L) 10/23/2012 2335   GFRAA 27 (L) 11/19/2016 0842   GFRAA 50 (L) 10/23/2012 2335    No results found for: SPEP, UPEP  Lab Results  Component Value Date   WBC 5.1 11/19/2016   NEUTROABS 2.9 11/19/2016   HGB 10.1 (L) 11/19/2016   HCT 30.4 (L) 11/19/2016   MCV 96.8 11/19/2016   PLT 148 (L) 11/19/2016      Chemistry      Component Value Date/Time   NA 138 11/19/2016 0842   NA 137 10/23/2012 2335   K 5.2 (H) 11/19/2016 0842   K 4.4 10/23/2012 2335   CL 106 11/19/2016 0842   CL 107 10/23/2012 2335   CO2 24 11/19/2016 0842   CO2 26 10/23/2012 2335   BUN 53 (H) 11/19/2016 0842   BUN 21 (H) 10/23/2012 2335   CREATININE 2.00 (H) 11/19/2016 0842   CREATININE 1.23 10/23/2012 2335      Component Value Date/Time   CALCIUM 9.8 11/19/2016 0842   CALCIUM 10.3 (H) 10/23/2012 2335   ALKPHOS 79 09/01/2016 1352   ALKPHOS 98 06/16/2011 1144   AST 26 09/01/2016 1352   AST 22 08/18/2011 1026   ALT 19 09/01/2016 1352   ALT 63 06/16/2011 1144   BILITOT 0.6 09/01/2016 1352   BILITOT 0.4 06/16/2011 1144       RADIOGRAPHIC STUDIES: I have personally reviewed the radiological images as listed and agreed with the findings in the report. No results found.   ASSESSMENT & PLAN:  Anemia due to stage 3 chronic kidney disease # IDA/ CKD-hemoglobin today is 10.3  Patient currently taking iron 65 mg BID/aranesp. Ferritin- today is 467 saturation 18%. Patient  will continue taking by mouth iron.   # Goal iron saturation is 30%- I would recommend IV Venofer today. Will not need Aranesp today.   # right lower lobe 3 mm lung nodule- noted on CT scan from today [through PCP for worsening shortness of breath]. Defer follow up with PCP.   # plan follow up in 3 months; labs- 1 week prior/possible venofer.   CC; Dr.Lateef/ PCP.   No orders of the defined types were placed in this encounter.  All questions were answered. The patient knows to call the clinic with any problems, questions or concerns.      Cammie Sickle, MD 11/24/2016 7:16 PM

## 2016-12-02 ENCOUNTER — Ambulatory Visit: Payer: Medicare HMO | Admitting: Anesthesiology

## 2016-12-02 ENCOUNTER — Encounter: Payer: Self-pay | Admitting: *Deleted

## 2016-12-02 ENCOUNTER — Encounter
Admission: RE | Disposition: A | Discharge status: Home / Self Care | Payer: Self-pay | Source: Ambulatory Visit | Attending: Unknown Physician Specialty

## 2016-12-02 ENCOUNTER — Ambulatory Visit
Admission: RE | Admit: 2016-12-02 | Discharge: 2016-12-02 | Disposition: A | Discharge status: Home / Self Care | Payer: Medicare HMO | Source: Ambulatory Visit | Attending: Unknown Physician Specialty | Admitting: Unknown Physician Specialty

## 2016-12-02 DIAGNOSIS — E1122 Type 2 diabetes mellitus with diabetic chronic kidney disease: Secondary | ICD-10-CM | POA: Diagnosis not present

## 2016-12-02 DIAGNOSIS — E11319 Type 2 diabetes mellitus with unspecified diabetic retinopathy without macular edema: Secondary | ICD-10-CM | POA: Diagnosis not present

## 2016-12-02 DIAGNOSIS — Z9989 Dependence on other enabling machines and devices: Secondary | ICD-10-CM | POA: Diagnosis not present

## 2016-12-02 DIAGNOSIS — Z79899 Other long term (current) drug therapy: Secondary | ICD-10-CM | POA: Diagnosis not present

## 2016-12-02 DIAGNOSIS — G4733 Obstructive sleep apnea (adult) (pediatric): Secondary | ICD-10-CM | POA: Diagnosis not present

## 2016-12-02 DIAGNOSIS — E1151 Type 2 diabetes mellitus with diabetic peripheral angiopathy without gangrene: Secondary | ICD-10-CM | POA: Insufficient documentation

## 2016-12-02 DIAGNOSIS — I739 Peripheral vascular disease, unspecified: Secondary | ICD-10-CM | POA: Diagnosis not present

## 2016-12-02 DIAGNOSIS — Z86010 Personal history of colonic polyps: Secondary | ICD-10-CM | POA: Diagnosis not present

## 2016-12-02 DIAGNOSIS — Z7982 Long term (current) use of aspirin: Secondary | ICD-10-CM | POA: Diagnosis not present

## 2016-12-02 DIAGNOSIS — K640 First degree hemorrhoids: Secondary | ICD-10-CM | POA: Insufficient documentation

## 2016-12-02 DIAGNOSIS — D509 Iron deficiency anemia, unspecified: Secondary | ICD-10-CM | POA: Diagnosis present

## 2016-12-02 DIAGNOSIS — I10 Essential (primary) hypertension: Secondary | ICD-10-CM | POA: Diagnosis not present

## 2016-12-02 DIAGNOSIS — K210 Gastro-esophageal reflux disease with esophagitis: Secondary | ICD-10-CM | POA: Diagnosis not present

## 2016-12-02 DIAGNOSIS — N189 Chronic kidney disease, unspecified: Secondary | ICD-10-CM | POA: Insufficient documentation

## 2016-12-02 DIAGNOSIS — K208 Other esophagitis: Secondary | ICD-10-CM | POA: Diagnosis not present

## 2016-12-02 DIAGNOSIS — K449 Diaphragmatic hernia without obstruction or gangrene: Secondary | ICD-10-CM | POA: Diagnosis not present

## 2016-12-02 DIAGNOSIS — K209 Esophagitis, unspecified: Secondary | ICD-10-CM | POA: Diagnosis not present

## 2016-12-02 DIAGNOSIS — I129 Hypertensive chronic kidney disease with stage 1 through stage 4 chronic kidney disease, or unspecified chronic kidney disease: Secondary | ICD-10-CM | POA: Insufficient documentation

## 2016-12-02 DIAGNOSIS — J449 Chronic obstructive pulmonary disease, unspecified: Secondary | ICD-10-CM | POA: Diagnosis not present

## 2016-12-02 DIAGNOSIS — Z7984 Long term (current) use of oral hypoglycemic drugs: Secondary | ICD-10-CM | POA: Insufficient documentation

## 2016-12-02 DIAGNOSIS — E119 Type 2 diabetes mellitus without complications: Secondary | ICD-10-CM | POA: Diagnosis not present

## 2016-12-02 DIAGNOSIS — E785 Hyperlipidemia, unspecified: Secondary | ICD-10-CM | POA: Diagnosis not present

## 2016-12-02 DIAGNOSIS — K219 Gastro-esophageal reflux disease without esophagitis: Secondary | ICD-10-CM | POA: Diagnosis not present

## 2016-12-02 HISTORY — DX: Benign neoplasm of colon, unspecified: D12.6

## 2016-12-02 HISTORY — PX: COLONOSCOPY WITH PROPOFOL: SHX5780

## 2016-12-02 HISTORY — DX: Obesity, unspecified: E66.9

## 2016-12-02 HISTORY — DX: Other fecal abnormalities: R19.5

## 2016-12-02 HISTORY — PX: ESOPHAGOGASTRODUODENOSCOPY (EGD) WITH PROPOFOL: SHX5813

## 2016-12-02 LAB — GLUCOSE, CAPILLARY: Glucose-Capillary: 75 mg/dL (ref 65–99)

## 2016-12-02 SURGERY — COLONOSCOPY WITH PROPOFOL
Anesthesia: General

## 2016-12-02 MED ORDER — SODIUM CHLORIDE 0.9 % IV SOLN
INTRAVENOUS | Status: DC
  Administered 2016-12-02: 08:00:00 via INTRAVENOUS

## 2016-12-02 MED ORDER — PROPOFOL 500 MG/50ML IV EMUL
INTRAVENOUS | Status: AC
  Filled 2016-12-02: qty 50

## 2016-12-02 MED ORDER — PHENYLEPHRINE HCL 10 MG/ML IJ SOLN
INTRAMUSCULAR | Status: DC | PRN
  Administered 2016-12-02 (×3): 100 ug via INTRAVENOUS
  Administered 2016-12-02: 200 ug via INTRAVENOUS
  Administered 2016-12-02: 100 ug via INTRAVENOUS

## 2016-12-02 MED ORDER — MIDAZOLAM HCL 2 MG/2ML IJ SOLN
INTRAMUSCULAR | Status: AC
  Filled 2016-12-02: qty 2

## 2016-12-02 MED ORDER — FENTANYL CITRATE (PF) 100 MCG/2ML IJ SOLN
INTRAMUSCULAR | Status: DC | PRN
  Administered 2016-12-02 (×2): 50 ug via INTRAVENOUS

## 2016-12-02 MED ORDER — PROPOFOL 10 MG/ML IV BOLUS
INTRAVENOUS | Status: DC | PRN
  Administered 2016-12-02: 10 mg via INTRAVENOUS
  Administered 2016-12-02 (×3): 30 mg via INTRAVENOUS

## 2016-12-02 MED ORDER — CARVEDILOL 6.25 MG PO TABS
ORAL_TABLET | ORAL | Status: AC
  Filled 2016-12-02: qty 1

## 2016-12-02 MED ORDER — CARVEDILOL 6.25 MG PO TABS: 6.2500 mg | ORAL_TABLET | Freq: Two times a day (BID) | ORAL | Status: DC

## 2016-12-02 MED ORDER — MIDAZOLAM HCL 2 MG/2ML IJ SOLN
INTRAMUSCULAR | Status: DC | PRN
  Administered 2016-12-02 (×2): 1 mg via INTRAVENOUS

## 2016-12-02 MED ORDER — FENTANYL CITRATE (PF) 100 MCG/2ML IJ SOLN
INTRAMUSCULAR | Status: AC
  Filled 2016-12-02: qty 2

## 2016-12-02 MED ORDER — CARVEDILOL 6.25 MG PO TABS: 6.2500 mg | ORAL_TABLET | Freq: Once | ORAL | Status: DC

## 2016-12-02 MED ORDER — PROPOFOL 500 MG/50ML IV EMUL
INTRAVENOUS | Status: DC | PRN
  Administered 2016-12-02: 60 ug/kg/min via INTRAVENOUS

## 2016-12-02 NOTE — Op Note (Signed)
[facility] Gastroenterology Patient Name: Sara Bright Procedure Date: 12/02/2016 7:20 AM MRN: [id number] Account #: [id number] Date of Birth: [birth year] Admit Type: Inpatient Age: 76 Room: [facility] ENDO ROOM 3 Gender: Female Note Status: Finalized Procedure:            Upper GI endoscopy Indications:          Unexplained iron deficiency anemia Providers:            Manya Silvas, MD Medicines:            Propofol per Anesthesia Complications:        No immediate complications. Procedure:            Pre-Anesthesia Assessment:                       - After reviewing the risks and benefits, the patient                        was deemed in satisfactory condition to undergo the                        procedure.                       After obtaining informed consent, the endoscope was                        passed under direct vision. Throughout the procedure,                        the patient's blood pressure, pulse, and oxygen                        saturations were monitored continuously. The Endoscope                        was introduced through the mouth, and advanced to the                        second part of duodenum. The upper GI endoscopy was                        accomplished without difficulty. The patient tolerated                        the procedure well. Findings:      LA Grade A (one or more mucosal breaks less than 5 mm, not extending       between tops of 2 mucosal folds) esophagitis with no bleeding was found       38 cm from the incisors. Biopsies were taken with a cold forceps for       histology.      The stomach was normal.      The examined duodenum was normal. Impression:           - LA Grade A reflux esophagitis. Rule out Barrett's                        esophagus. Biopsied.                       - Normal stomach.                       -  Normal examined duodenum. Recommendation:       - Await pathology results.   - Perform a colonoscopy as previously scheduled. Manya Silvas, MD 12/02/2016 7:53:13 AM This report has been signed electronically. Number of Addenda: 0 Note Initiated On: 12/02/2016 7:20 AM      [facility]

## 2016-12-02 NOTE — Anesthesia Post-op Follow-up Note (Signed)
Anesthesia QCDR form completed.        

## 2016-12-02 NOTE — Op Note (Signed)
[facility] Gastroenterology Patient Name: Sara Bright Procedure Date: 12/02/2016 7:20 AM MRN: [id number] Account #: [id number] Date of Birth: 05-Mar-1941 Admit Type: Outpatient Age: 76 Room: [facility] ENDO ROOM 3 Gender: Female Note Status: Finalized Procedure:            Colonoscopy Indications:          High risk colon cancer surveillance: Personal history                        of colonic polyps Providers:            Manya Silvas, MD Medicines:            Propofol per Anesthesia Complications:        No immediate complications. Procedure:            Pre-Anesthesia Assessment:                       - After reviewing the risks and benefits, the patient                        was deemed in satisfactory condition to undergo the                        procedure.                       After obtaining informed consent, the colonoscope was                        passed under direct vision. Throughout the procedure,                        the patient's blood pressure, pulse, and oxygen                        saturations were monitored continuously. The                        Colonoscope was introduced through the anus and                        advanced to the the cecum, identified by appendiceal                        orifice and ileocecal valve. The colonoscopy was                        performed without difficulty. The patient tolerated the                        procedure well. The quality of the bowel preparation                        was excellent. Findings:      Internal hemorrhoids were found during endoscopy. The hemorrhoids were       small, medium-sized and Grade I (internal hemorrhoids that do not       prolapse).      The exam was otherwise without abnormality. Impression:           - Internal hemorrhoids.                       -  The examination was otherwise normal.                       - No specimens collected. Recommendation:       - The  findings and recommendations were discussed with                        the patient's family. Chronic iron and multivitamins                        are recommended.Manya Silvas, MD 12/02/2016 8:13:08 AM This report has been signed electronically. Number of Addenda: 0 Note Initiated On: 12/02/2016 7:20 AM Scope Withdrawal Time: 0 hours 7 minutes 7 seconds  Total Procedure Duration: 0 hours 13 minutes 21 seconds       [facility]

## 2016-12-02 NOTE — H&P (Signed)
Primary Care Physician:  Sallee Lange, NP Primary Gastroenterologist:  Dr. Vira Agar  Pre-Procedure History & Physical: HPI:  Sara Bright is a 76 y.o. female is here for an endoscopy and colonoscopy.   Past Medical History:  Diagnosis Date  . Adenoma of colon   . Adenomatous colon polyp   . Anemia   . Cataract   . Cervical stenosis of spinal canal   . Chronic kidney disease   . Diabetes mellitus without complication ([place])   . GERD (gastroesophageal reflux disease)   . Glaucoma   . Heme positive stool   . History of UTI   . Hx of gallstones   . Hyperkalemia   . Hyperlipidemia   . Hyperlipidemia   . Hypertension   . Lumbar stenosis   . Neuropathy associated with endocrine disorder ([place])   . Obesity   . OSA on CPAP   . Osteoarthritis   . Osteoarthritis   . Retinopathy due to secondary diabetes ([place])   . Secondary hyperparathyroidism of renal origin [facility])     Past Surgical History:  Procedure Laterality Date  . ACDF X2    . BACK SURGERY    . Cataract extraction right    . catarect extraction left    . CHOLECYSTECTOMY    . colonoscopy with removal lesions by snare    . Endoscoic carpal tunnel release    . Laminectomy posterior lumbar facetectomy and formaninotomy w/Decomp    . Laminectony posterior cervicle decomp w/Facectomy and foraminotomy    . VEIN LIGATION AND STRIPPING Left     Prior to Admission medications   Medication Sig Start Date End Date Taking? Authorizing Provider  amLODipine (NORVASC) 5 MG tablet Take 5 mg by mouth daily.  06/05/15  Yes [provider]  aspirin EC 81 MG tablet Take 81 mg by mouth daily.   Yes [provider]  calcitRIOL (ROCALTROL) 0.25 MCG capsule Take 0.25 mcg by mouth 3 (three) times a week.  09/24/15  Yes [provider]  carvedilol (COREG) 6.25 MG tablet Take 6.25 mg by mouth 2 (two) times daily. 05/06/16  Yes [provider]  furosemide (LASIX) 40 MG tablet Take 40 mg by mouth  daily.  08/20/15  Yes [provider]  gabapentin (NEURONTIN) 100 MG capsule Take 100 mg by mouth 2 (two) times daily.  07/15/15  Yes [provider]  glimepiride (AMARYL) 1 MG tablet Take 1 mg by mouth daily with breakfast.  08/20/15  Yes [provider]  losartan (COZAAR) 50 MG tablet Take 100 mg by mouth daily.  09/24/15  Yes [provider]  Multiple Vitamins-Minerals (CENTRUM SILVER) tablet Take 1 tablet by mouth daily.  04/30/10  Yes [provider]  Omega-3 Fatty Acids (FISH OIL) 1000 MG CAPS Take 1,000 mg by mouth daily.  04/30/10  Yes [provider]  omeprazole (PRILOSEC) 20 MG capsule Take 20 mg by mouth daily.  08/19/15  Yes [provider]  timolol (TIMOPTIC-XR) 0.5 % ophthalmic gel-forming Place 1 drop into both eyes.  01/17/14  Yes [provider]  TRADJENTA 5 MG TABS tablet Take 5 mg by mouth daily.  08/04/15  Yes [provider]  acetaminophen (TYLENOL) 500 MG tablet Take by mouth.    [provider]  albuterol (PROVENTIL HFA;VENTOLIN HFA) 108 (90 Base) MCG/ACT inhaler Inhale into the lungs. 08/12/16 08/12/17  [provider]  Martin  07/22/16   [provider]  ferrous  sulfate 325 (65 FE) MG tablet Take 325 mg by mouth daily.    [provider]  gemfibrozil (LOPID) 600 MG tablet Take 600 mg by mouth daily.  08/09/15   [provider]  Lancet Devices (ADJUSTABLE LANCING DEVICE) [place]  07/22/16   [provider]  latanoprost (XALATAN) 0.005 % ophthalmic solution Place 1 drop into both eyes.  08/09/15   [provider]  ONE TOUCH ULTRA TEST test strip  07/22/16   [provider]  pioglitazone (ACTOS) 30 MG tablet Take 30 mg by mouth daily.  05/21/15 05/20/16  [provider]    Allergies as of 10/27/2016 - Review Complete 09/08/2016  Allergen Reaction Noted  . Hydrocodone-acetaminophen Other (See Comments) 06/29/2013   . Statins Other (See Comments) 06/29/2013    Family History  Problem Relation Age of Onset  . Coronary artery disease Mother   . Diabetes type II Mother   . Hypertension Mother   . Tuberculosis Father   . Stroke Father   . Diabetes type II Sister   . Migraines Sister   . Alcohol abuse Brother   . Coronary artery disease Brother   . Diabetes type II Brother   . Kidney cancer Brother   . Kidney disease Brother   . Heart attack Brother   . Hypertension Brother     Social History   Social History  . Marital status: Married    Spouse name: N/A  . Number of children: N/A  . Years of education: N/A   Occupational History  . Not on file.   Social History Main Topics  . Smoking status: Never Smoker  . Smokeless tobacco: Never Used  . Alcohol use No  . Drug use: No  . Sexual activity: Not on file   Other Topics Concern  . Not on file   Social History Narrative  . No narrative on file    Review of Systems: See HPI, otherwise negative ROS  Physical Exam: BP (!) 157/64   Pulse 72   Temp (!) 96.9 F (36.1 C) (Tympanic)   Resp 16   Ht 5\' 4"  (1.626 m)   Wt 110.7 kg (244 lb)   SpO2 100%   BMI 41.88 kg/m  General:   Alert,  pleasant and cooperative in NAD Head:  Normocephalic and atraumatic. Neck:  Supple; no masses or thyromegaly. Lungs:  Clear throughout to auscultation.    Heart:  Regular rate and rhythm. Abdomen:  Soft, nontender and nondistended. Normal bowel sounds, without guarding, and without rebound.   Neurologic:  Alert and  oriented x4;  grossly normal neurologically.  Impression/Plan: Sara Bright is here for an endoscopy and colonoscopy to be performed for [facility] colon polyps and iron def anemia.  Risks, benefits, limitations, and alternatives regarding  endoscopy and colonoscopy have been reviewed with the patient.  Questions have been answered.  All parties agreeable.   Gaylyn Cheers, MD  12/02/2016, 7:25 AM

## 2016-12-02 NOTE — Anesthesia Postprocedure Evaluation (Signed)
Anesthesia Post Note  Patient: Sara Bright  Procedure(s) Performed: Procedure(s) (LRB): COLONOSCOPY WITH PROPOFOL (N/A) ESOPHAGOGASTRODUODENOSCOPY (EGD) WITH PROPOFOL (N/A)  Patient location during evaluation: Endoscopy Anesthesia Type: General Level of consciousness: awake and alert Pain management: pain level controlled Vital Signs Assessment: post-procedure vital signs reviewed and stable Respiratory status: spontaneous breathing and respiratory function stable Cardiovascular status: stable Anesthetic complications: no     Last Vitals:  Vitals:   12/02/16 0817 12/02/16 0819  BP: (!) 126/51 (!) 126/51  Pulse: 73 77  Resp: 19 18  Temp:  (!) 36.2 C  SpO2: 100% 100%    Last Pain:  Vitals:   12/02/16 0819  TempSrc: Tympanic                 KEPHART,WILLIAM K

## 2016-12-02 NOTE — Anesthesia Preprocedure Evaluation (Signed)
Anesthesia Evaluation  Patient identified by MRN, date of birth, ID band Patient awake    Reviewed: Allergy & Precautions, NPO status , Patient's Chart, lab work & pertinent test results  History of Anesthesia Complications Negative for: history of anesthetic complications  Airway Mallampati: III       Dental   Pulmonary asthma , sleep apnea, Continuous Positive Airway Pressure Ventilation and Oxygen sleep apnea , COPD,  COPD inhaler,           Cardiovascular hypertension, Pt. on medications + Peripheral Vascular Disease  (-) Past MI and (-) CHF (-) dysrhythmias (-) Valvular Problems/Murmurs     Neuro/Psych neg Seizures    GI/Hepatic Neg liver ROS, GERD  Medicated,  Endo/Other  diabetes, Type 2, Oral Hypoglycemic Agents  Renal/GU Renal InsufficiencyRenal disease     Musculoskeletal   Abdominal   Peds  Hematology  (+) anemia ,   Anesthesia Other Findings   Reproductive/Obstetrics                             Anesthesia Physical Anesthesia Plan  ASA: III  Anesthesia Plan: General   Post-op Pain Management:    Induction: Intravenous  PONV Risk Score and Plan: Propofol infusion  Airway Management Planned:   Additional Equipment:   Intra-op Plan:   Post-operative Plan:   Informed Consent: I have reviewed the patients History and Physical, chart, labs and discussed the procedure including the risks, benefits and alternatives for the proposed anesthesia with the patient or authorized representative who has indicated his/her understanding and acceptance.     Plan Discussed with:   Anesthesia Plan Comments:         Anesthesia Quick Evaluation

## 2016-12-02 NOTE — Transfer of Care (Signed)
Immediate Anesthesia Transfer of Care Note  Patient: SHENICKA SUNDERLIN  Procedure(s) Performed: Procedure(s): COLONOSCOPY WITH PROPOFOL (N/A) ESOPHAGOGASTRODUODENOSCOPY (EGD) WITH PROPOFOL (N/A)  Patient Location: PACU  Anesthesia Type:General  Level of Consciousness: awake and drowsy  Airway & Oxygen Therapy: Patient Spontanous Breathing and Patient connected to nasal cannula oxygen  Post-op Assessment: Report given to RN and Post -op Vital signs reviewed and stable  Post vital signs: Reviewed and stable  Last Vitals:  Vitals:   12/02/16 0659  BP: (!) 157/64  Pulse: 72  Resp: 16  Temp: (!) 36.1 C  SpO2: 100%    Last Pain:  Vitals:   12/02/16 0659  TempSrc: Tympanic         Complications: No apparent anesthesia complications

## 2016-12-03 ENCOUNTER — Encounter: Payer: Self-pay | Admitting: Unknown Physician Specialty

## 2016-12-04 LAB — SURGICAL PATHOLOGY

## 2016-12-06 DIAGNOSIS — G4733 Obstructive sleep apnea (adult) (pediatric): Secondary | ICD-10-CM | POA: Diagnosis not present

## 2016-12-07 DIAGNOSIS — I10 Essential (primary) hypertension: Secondary | ICD-10-CM | POA: Diagnosis not present

## 2016-12-07 DIAGNOSIS — N183 Chronic kidney disease, stage 3 (moderate): Secondary | ICD-10-CM | POA: Diagnosis not present

## 2016-12-07 DIAGNOSIS — E1122 Type 2 diabetes mellitus with diabetic chronic kidney disease: Secondary | ICD-10-CM | POA: Diagnosis not present

## 2016-12-07 DIAGNOSIS — N2581 Secondary hyperparathyroidism of renal origin: Secondary | ICD-10-CM | POA: Diagnosis not present

## 2016-12-07 DIAGNOSIS — D631 Anemia in chronic kidney disease: Secondary | ICD-10-CM | POA: Diagnosis not present

## 2016-12-08 DIAGNOSIS — R69 Illness, unspecified: Secondary | ICD-10-CM | POA: Diagnosis not present

## 2016-12-20 DIAGNOSIS — G4733 Obstructive sleep apnea (adult) (pediatric): Secondary | ICD-10-CM | POA: Diagnosis not present

## 2016-12-21 ENCOUNTER — Other Ambulatory Visit: Payer: Self-pay

## 2016-12-21 DIAGNOSIS — D508 Other iron deficiency anemias: Secondary | ICD-10-CM

## 2016-12-22 ENCOUNTER — Inpatient Hospital Stay: Payer: Medicare HMO

## 2016-12-22 ENCOUNTER — Other Ambulatory Visit: Payer: Self-pay

## 2016-12-22 ENCOUNTER — Inpatient Hospital Stay: Payer: Medicare HMO | Attending: Internal Medicine

## 2016-12-22 VITALS — BP 142/74 | HR 73 | Resp 16

## 2016-12-22 DIAGNOSIS — N183 Chronic kidney disease, stage 3 (moderate): Principal | ICD-10-CM

## 2016-12-22 DIAGNOSIS — D631 Anemia in chronic kidney disease: Secondary | ICD-10-CM

## 2016-12-22 DIAGNOSIS — I129 Hypertensive chronic kidney disease with stage 1 through stage 4 chronic kidney disease, or unspecified chronic kidney disease: Secondary | ICD-10-CM | POA: Insufficient documentation

## 2016-12-22 DIAGNOSIS — D508 Other iron deficiency anemias: Secondary | ICD-10-CM

## 2016-12-22 DIAGNOSIS — Z79899 Other long term (current) drug therapy: Secondary | ICD-10-CM | POA: Insufficient documentation

## 2016-12-22 LAB — CBC WITH DIFFERENTIAL/PLATELET
BASOS PCT: 1 %
Basophils Absolute: 0 10*3/uL (ref 0–0.1)
EOS ABS: 0.2 10*3/uL (ref 0–0.7)
EOS PCT: 4 %
HCT: 28.9 % — ABNORMAL LOW (ref 35.0–47.0)
HEMOGLOBIN: 9.6 g/dL — AB (ref 12.0–16.0)
LYMPHS ABS: 1.6 10*3/uL (ref 1.0–3.6)
Lymphocytes Relative: 31 %
MCH: 32 pg (ref 26.0–34.0)
MCHC: 33.2 g/dL (ref 32.0–36.0)
MCV: 96.4 fL (ref 80.0–100.0)
MONO ABS: 0.6 10*3/uL (ref 0.2–0.9)
MONOS PCT: 11 %
NEUTROS PCT: 53 %
Neutro Abs: 2.7 10*3/uL (ref 1.4–6.5)
PLATELETS: 159 10*3/uL (ref 150–440)
RBC: 2.99 MIL/uL — ABNORMAL LOW (ref 3.80–5.20)
RDW: 14.8 % — AB (ref 11.5–14.5)
WBC: 5.1 10*3/uL (ref 3.6–11.0)

## 2016-12-22 LAB — IRON AND TIBC
IRON: 79 ug/dL (ref 28–170)
Saturation Ratios: 25 % (ref 10.4–31.8)
TIBC: 315 ug/dL (ref 250–450)
UIBC: 236 ug/dL

## 2016-12-22 LAB — FERRITIN: Ferritin: 633 ng/mL — ABNORMAL HIGH (ref 11–307)

## 2016-12-22 MED ORDER — DARBEPOETIN ALFA 200 MCG/0.4ML IJ SOSY
200.0000 ug | PREFILLED_SYRINGE | Freq: Once | INTRAMUSCULAR | Status: AC
  Administered 2016-12-22: 200 ug via SUBCUTANEOUS
  Filled 2016-12-22: qty 0.4

## 2016-12-22 NOTE — Patient Instructions (Signed)

## 2016-12-25 ENCOUNTER — Encounter (INDEPENDENT_AMBULATORY_CARE_PROVIDER_SITE_OTHER): Payer: Self-pay | Admitting: Vascular Surgery

## 2016-12-25 ENCOUNTER — Ambulatory Visit (INDEPENDENT_AMBULATORY_CARE_PROVIDER_SITE_OTHER): Payer: Medicare HMO

## 2016-12-25 ENCOUNTER — Ambulatory Visit (INDEPENDENT_AMBULATORY_CARE_PROVIDER_SITE_OTHER): Payer: Medicare HMO | Admitting: Vascular Surgery

## 2016-12-25 VITALS — BP 133/64 | HR 67 | Resp 16 | Wt 248.8 lb

## 2016-12-25 DIAGNOSIS — I872 Venous insufficiency (chronic) (peripheral): Secondary | ICD-10-CM

## 2016-12-25 DIAGNOSIS — I10 Essential (primary) hypertension: Secondary | ICD-10-CM | POA: Diagnosis not present

## 2016-12-25 DIAGNOSIS — I83893 Varicose veins of bilateral lower extremities with other complications: Secondary | ICD-10-CM

## 2016-12-25 DIAGNOSIS — E119 Type 2 diabetes mellitus without complications: Secondary | ICD-10-CM | POA: Insufficient documentation

## 2016-12-25 DIAGNOSIS — M7989 Other specified soft tissue disorders: Secondary | ICD-10-CM

## 2016-12-25 DIAGNOSIS — I890 Lymphedema, not elsewhere classified: Secondary | ICD-10-CM | POA: Diagnosis not present

## 2016-12-25 DIAGNOSIS — E118 Type 2 diabetes mellitus with unspecified complications: Secondary | ICD-10-CM

## 2016-12-25 NOTE — Progress Notes (Signed)
Subjective:    Patient ID: Sara Bright, female    DOB: 08-15-1940, 76 y.o.   MRN: [id number] Chief Complaint  Patient presents with  . Follow-up    2mo bil ven reflux   Patient presents for a six month follow-up. Patient also presents to review vascular studies. Patient is status post a left greater saphenous vein laser ablation on 02/28/2016. Patient presents today without complaint. Since her previous visit,the patient has been engaging in conservative therapy including wearing medical grade one compression stockings, elevating her legs and remaining active. She feels at this time her symptoms are being controlled. The patient underwent a bilateral lower extremity venous reflux exam which was notable for an ablated left greater saphenous vein.Venous incompetence noted in the left common from oral and popliteal veins. No venous reflux to the right lower extremity. No evidence of deep or superficial vein thrombosis in the bilateral lower extremities. Patient denies any fever, nausea or vomiting.   Review of Systems  Constitutional: Negative.   HENT: Negative.   Eyes: Negative.   Respiratory: Negative.   Cardiovascular: Negative.   Gastrointestinal: Negative.   Endocrine: Negative.   Genitourinary: Negative.   Musculoskeletal: Negative.   Skin: Negative.   Allergic/Immunologic: Negative.   Neurological: Negative.   Hematological: Negative.   Psychiatric/Behavioral: Negative.       Objective:   Physical Exam  Constitutional: She is oriented to person, place, and time. She appears well-developed and well-nourished. No distress.  HENT:  Head: Normocephalic and atraumatic.  Eyes: Pupils are equal, round, and reactive to light. Conjunctivae are normal.  Neck: Normal range of motion.  Cardiovascular: Normal rate, regular rhythm, normal heart sounds and intact distal pulses.   Pulses:      Radial pulses are 2+ on the right side, and 2+ on the left side.       Dorsalis pedis pulses  are 2+ on the right side, and 2+ on the left side.       Posterior tibial pulses are 2+ on the right side, and 2+ on the left side.  Pulmonary/Chest: Effort normal.  Musculoskeletal: Normal range of motion. She exhibits edema (mild nonpitting bilateral lower extremity edema noted).  Neurological: She is alert and oriented to person, place, and time.  Skin: She is not diaphoretic.  Mild stasis dermatitis noted bilaterally. There are no skin changes noted. Less than 1 cm scattered varicosities to the bilateral lower extremity  Psychiatric: She has a normal mood and affect. Her behavior is normal. Judgment and thought content normal.  Vitals reviewed.  BP 133/64 (BP Location: Left Arm)   Pulse 67   Resp 16   Wt 248 lb 12.8 oz (112.9 kg)   BMI 42.71 kg/m   Past Medical History:  Diagnosis Date  . Adenoma of colon   . Adenomatous colon polyp   . Anemia   . Cataract   . Cervical stenosis of spinal canal   . Chronic kidney disease   . Diabetes mellitus without complication ([place])   . GERD (gastroesophageal reflux disease)   . Glaucoma   . Heme positive stool   . History of UTI   . Hx of gallstones   . Hyperkalemia   . Hyperlipidemia   . Hyperlipidemia   . Hypertension   . Lumbar stenosis   . Neuropathy associated with endocrine disorder ([place])   . Obesity   . OSA on CPAP   . Osteoarthritis   . Osteoarthritis   . Retinopathy due to  secondary diabetes ([place])   . Secondary hyperparathyroidism of renal origin [facility])    Social History   Social History  . Marital status: Married    Spouse name: N/A  . Number of children: N/A  . Years of education: N/A   Occupational History  . Not on file.   Social History Main Topics  . Smoking status: Never Smoker  . Smokeless tobacco: Never Used  . Alcohol use No  . Drug use: No  . Sexual activity: Not on file   Other Topics Concern  . Not on file   Social History Narrative  . No narrative on file   Past Surgical History:    Procedure Laterality Date  . ACDF X2    . BACK SURGERY    . Cataract extraction right    . catarect extraction left    . CHOLECYSTECTOMY    . COLONOSCOPY WITH PROPOFOL N/A 12/02/2016   Procedure: COLONOSCOPY WITH PROPOFOL;  Surgeon: Manya Silvas, MD;  Location: [facility] ENDOSCOPY;  Service: Endoscopy;  Laterality: N/A;  . colonoscopy with removal lesions by snare    . Endoscoic carpal tunnel release    . ESOPHAGOGASTRODUODENOSCOPY (EGD) WITH PROPOFOL N/A 12/02/2016   Procedure: ESOPHAGOGASTRODUODENOSCOPY (EGD) WITH PROPOFOL;  Surgeon: Manya Silvas, MD;  Location[facility] ENDOSCOPY;  Service: Endoscopy;  Laterality: N/A;  . Laminectomy posterior lumbar facetectomy and formaninotomy w/Decomp    . Laminectony posterior cervicle decomp w/Facectomy and foraminotomy    . VEIN LIGATION AND STRIPPING Left    Family History  Problem Relation Age of Onset  . Coronary artery disease Mother   . Diabetes type II Mother   . Hypertension Mother   . Tuberculosis Father   . Stroke Father   . Diabetes type II Sister   . Migraines Sister   . Alcohol abuse Brother   . Coronary artery disease Brother   . Diabetes type II Brother   . Kidney cancer Brother   . Kidney disease Brother   . Heart attack Brother   . Hypertension Brother    Allergies  Allergen Reactions  . Hydrocodone-Acetaminophen Other (See Comments)    Sedation and GI upset, pt is not allergic to acetaminophen  . Statins Other (See Comments)      Assessment & Plan:  Patient presents for a six month follow-up. Patient also presents to review vascular studies. Patient is status post a left greater saphenous vein laser ablation on 02/28/2016. Patient presents today without complaint. Since her previous visit,the patient has been engaging in conservative therapy including wearing medical grade one compression stockings, elevating her legs and remaining active. She feels at this time her symptoms are being controlled. The patient  underwent a bilateral lower extremity venous reflux exam which was notable for an ablated left greater saphenous vein.Venous incompetence noted in the left common from oral and popliteal veins. No venous reflux to the right lower extremity. No evidence of deep or superficial vein thrombosis in the bilateral lower extremities. Patient denies any fever, nausea or vomiting.  1. Chronic venous insufficiency - Stable Patient with laser ablation to the left greater saphenous vein on 02/28/2016. This remains ablated on today's duplex. There is no venous incompetence to the right lower extremity Patient to continue engaging in conservative therapy  2. Lymphedema - New The patient was encouraged to continue wearing graduated compression stockings (20-30 mmHg) on a daily basis. The patient was instructed to begin wearing the stockings first thing in the morning and removing them in  the evening. The patient was instructed specifically not to sleep in the stockings.  Behavioral modification including elevation during the day will be continued. We discussed a lymphedema pump if conventional therapy goes not work. Patients symptoms seemed to be controlled by engaging conservative therapy. Patient to follow up when necessary The patient was instructed to call the office in the interim if any worsening edema or ulcerations to the legs, feet or toes occurs. The patient expresses their understanding.  3. Type 2 diabetes mellitus with complication, unspecified whether long term insulin use (HCC) - stable Encouraged good control as its slows the progression of atherosclerotic disease  4. Essential hypertension - stable Encouraged good control as its slows the progression of atherosclerotic disease  Current Outpatient Prescriptions on File Prior to Visit  Medication Sig Dispense Refill  . acetaminophen (TYLENOL) 500 MG tablet Take by mouth.    Marland Kitchen albuterol (PROVENTIL HFA;VENTOLIN HFA) 108 (90 Base) MCG/ACT  inhaler Inhale into the lungs.    Marland Kitchen amLODipine (NORVASC) 5 MG tablet Take 5 mg by mouth daily.     Marland Kitchen aspirin EC 81 MG tablet Take 81 mg by mouth daily.    . ASSURE COMFORT LANCETS 30G MISC     . calcitRIOL (ROCALTROL) 0.25 MCG capsule Take 0.25 mcg by mouth 3 (three) times a week.     . carvedilol (COREG) 6.25 MG tablet Take 6.25 mg by mouth 2 (two) times daily.  12  . ferrous sulfate 325 (65 FE) MG tablet Take 325 mg by mouth daily.    . furosemide (LASIX) 40 MG tablet Take 40 mg by mouth daily.     Marland Kitchen gabapentin (NEURONTIN) 100 MG capsule Take 100 mg by mouth 2 (two) times daily.     Marland Kitchen gemfibrozil (LOPID) 600 MG tablet Take 600 mg by mouth daily.     Marland Kitchen glimepiride (AMARYL) 1 MG tablet Take 1 mg by mouth daily with breakfast.     . Lancet Devices (ADJUSTABLE LANCING DEVICE) MISC     . latanoprost (XALATAN) 0.005 % ophthalmic solution Place 1 drop into both eyes.     Marland Kitchen losartan (COZAAR) 50 MG tablet Take 100 mg by mouth daily.     . Multiple Vitamins-Minerals (CENTRUM SILVER) tablet Take 1 tablet by mouth daily.     . Omega-3 Fatty Acids (FISH OIL) 1000 MG CAPS Take 1,000 mg by mouth daily.     Marland Kitchen omeprazole (PRILOSEC) 20 MG capsule Take 20 mg by mouth daily.     . ONE TOUCH ULTRA TEST test strip     . timolol (TIMOPTIC-XR) 0.5 % ophthalmic gel-forming Place 1 drop into both eyes.     . TRADJENTA 5 MG TABS tablet Take 5 mg by mouth daily.     . pioglitazone (ACTOS) 30 MG tablet Take 30 mg by mouth daily.      No current facility-administered medications on file prior to visit.     There are no Patient Instructions on file for this visit. No Follow-up on file.   Atreus Hasz A Chevella Pearce, PA-C

## 2017-01-05 DIAGNOSIS — D509 Iron deficiency anemia, unspecified: Secondary | ICD-10-CM | POA: Diagnosis not present

## 2017-01-05 DIAGNOSIS — R195 Other fecal abnormalities: Secondary | ICD-10-CM | POA: Diagnosis not present

## 2017-01-06 DIAGNOSIS — G4733 Obstructive sleep apnea (adult) (pediatric): Secondary | ICD-10-CM | POA: Diagnosis not present

## 2017-01-14 DIAGNOSIS — D509 Iron deficiency anemia, unspecified: Secondary | ICD-10-CM | POA: Diagnosis not present

## 2017-01-19 ENCOUNTER — Other Ambulatory Visit: Payer: Self-pay | Admitting: Internal Medicine

## 2017-01-19 ENCOUNTER — Inpatient Hospital Stay: Payer: Medicare HMO | Attending: Internal Medicine

## 2017-01-19 ENCOUNTER — Inpatient Hospital Stay: Payer: Medicare HMO

## 2017-01-19 DIAGNOSIS — N183 Chronic kidney disease, stage 3 (moderate): Secondary | ICD-10-CM | POA: Diagnosis not present

## 2017-01-19 DIAGNOSIS — D631 Anemia in chronic kidney disease: Secondary | ICD-10-CM | POA: Insufficient documentation

## 2017-01-19 DIAGNOSIS — I129 Hypertensive chronic kidney disease with stage 1 through stage 4 chronic kidney disease, or unspecified chronic kidney disease: Secondary | ICD-10-CM | POA: Diagnosis not present

## 2017-01-19 DIAGNOSIS — D508 Other iron deficiency anemias: Secondary | ICD-10-CM

## 2017-01-19 LAB — CBC WITH DIFFERENTIAL/PLATELET
BASOS ABS: 0.1 10*3/uL (ref 0–0.1)
BASOS PCT: 1 %
EOS PCT: 4 %
Eosinophils Absolute: 0.2 10*3/uL (ref 0–0.7)
HEMATOCRIT: 32.9 % — AB (ref 35.0–47.0)
Hemoglobin: 10.9 g/dL — ABNORMAL LOW (ref 12.0–16.0)
Lymphocytes Relative: 24 %
Lymphs Abs: 1.2 10*3/uL (ref 1.0–3.6)
MCH: 32.6 pg (ref 26.0–34.0)
MCHC: 33.2 g/dL (ref 32.0–36.0)
MCV: 98.2 fL (ref 80.0–100.0)
MONO ABS: 0.6 10*3/uL (ref 0.2–0.9)
Monocytes Relative: 11 %
NEUTROS ABS: 3 10*3/uL (ref 1.4–6.5)
Neutrophils Relative %: 60 %
Platelets: 175 10*3/uL (ref 150–440)
RBC: 3.35 MIL/uL — ABNORMAL LOW (ref 3.80–5.20)
RDW: 15.2 % — AB (ref 11.5–14.5)
WBC: 4.9 10*3/uL (ref 3.6–11.0)

## 2017-01-19 LAB — IRON AND TIBC
IRON: 54 ug/dL (ref 28–170)
SATURATION RATIOS: 18 % (ref 10.4–31.8)
TIBC: 304 ug/dL (ref 250–450)
UIBC: 250 ug/dL

## 2017-01-19 LAB — COMPREHENSIVE METABOLIC PANEL
ALBUMIN: 3.6 g/dL (ref 3.5–5.0)
ALK PHOS: 70 U/L (ref 38–126)
ALT: 20 U/L (ref 14–54)
ANION GAP: 6 (ref 5–15)
AST: 28 U/L (ref 15–41)
BILIRUBIN TOTAL: 0.8 mg/dL (ref 0.3–1.2)
BUN: 42 mg/dL — ABNORMAL HIGH (ref 6–20)
CALCIUM: 9.4 mg/dL (ref 8.9–10.3)
CO2: 25 mmol/L (ref 22–32)
Chloride: 101 mmol/L (ref 101–111)
Creatinine, Ser: 1.71 mg/dL — ABNORMAL HIGH (ref 0.44–1.00)
GFR calc Af Amer: 32 mL/min — ABNORMAL LOW (ref >60)
GFR, EST NON AFRICAN AMERICAN: 27 mL/min — AB (ref >60)
GLUCOSE: 163 mg/dL — AB (ref 65–99)
Potassium: 4.2 mmol/L (ref 3.5–5.1)
Sodium: 132 mmol/L — ABNORMAL LOW (ref 135–145)
TOTAL PROTEIN: 7.1 g/dL (ref 6.5–8.1)

## 2017-01-19 LAB — FERRITIN: Ferritin: 374 ng/mL — ABNORMAL HIGH (ref 11–307)

## 2017-01-20 DIAGNOSIS — G4733 Obstructive sleep apnea (adult) (pediatric): Secondary | ICD-10-CM | POA: Diagnosis not present

## 2017-02-08 DIAGNOSIS — G4733 Obstructive sleep apnea (adult) (pediatric): Secondary | ICD-10-CM | POA: Diagnosis not present

## 2017-02-16 ENCOUNTER — Other Ambulatory Visit: Payer: Medicare HMO

## 2017-02-16 ENCOUNTER — Ambulatory Visit: Payer: Medicare HMO

## 2017-02-16 ENCOUNTER — Ambulatory Visit: Payer: Medicare HMO | Admitting: Internal Medicine

## 2017-02-18 ENCOUNTER — Encounter: Payer: Self-pay | Admitting: Internal Medicine

## 2017-02-18 ENCOUNTER — Inpatient Hospital Stay: Payer: Medicare HMO

## 2017-02-18 ENCOUNTER — Other Ambulatory Visit: Payer: Self-pay | Admitting: *Deleted

## 2017-02-18 ENCOUNTER — Inpatient Hospital Stay: Payer: Medicare HMO | Attending: Internal Medicine | Admitting: Internal Medicine

## 2017-02-18 VITALS — BP 154/72 | HR 68 | Temp 98.1°F | Resp 18 | Wt 248.8 lb

## 2017-02-18 DIAGNOSIS — N183 Chronic kidney disease, stage 3 (moderate): Principal | ICD-10-CM

## 2017-02-18 DIAGNOSIS — Z87440 Personal history of urinary (tract) infections: Secondary | ICD-10-CM

## 2017-02-18 DIAGNOSIS — R0602 Shortness of breath: Secondary | ICD-10-CM | POA: Insufficient documentation

## 2017-02-18 DIAGNOSIS — D509 Iron deficiency anemia, unspecified: Secondary | ICD-10-CM | POA: Insufficient documentation

## 2017-02-18 DIAGNOSIS — E785 Hyperlipidemia, unspecified: Secondary | ICD-10-CM | POA: Diagnosis not present

## 2017-02-18 DIAGNOSIS — M1990 Unspecified osteoarthritis, unspecified site: Secondary | ICD-10-CM

## 2017-02-18 DIAGNOSIS — E875 Hyperkalemia: Secondary | ICD-10-CM | POA: Diagnosis not present

## 2017-02-18 DIAGNOSIS — D508 Other iron deficiency anemias: Secondary | ICD-10-CM

## 2017-02-18 DIAGNOSIS — Z86010 Personal history of colonic polyps: Secondary | ICD-10-CM | POA: Diagnosis not present

## 2017-02-18 DIAGNOSIS — K219 Gastro-esophageal reflux disease without esophagitis: Secondary | ICD-10-CM | POA: Insufficient documentation

## 2017-02-18 DIAGNOSIS — E11319 Type 2 diabetes mellitus with unspecified diabetic retinopathy without macular edema: Secondary | ICD-10-CM | POA: Diagnosis not present

## 2017-02-18 DIAGNOSIS — N2581 Secondary hyperparathyroidism of renal origin: Secondary | ICD-10-CM | POA: Insufficient documentation

## 2017-02-18 DIAGNOSIS — D631 Anemia in chronic kidney disease: Secondary | ICD-10-CM | POA: Diagnosis not present

## 2017-02-18 DIAGNOSIS — I129 Hypertensive chronic kidney disease with stage 1 through stage 4 chronic kidney disease, or unspecified chronic kidney disease: Secondary | ICD-10-CM | POA: Diagnosis not present

## 2017-02-18 DIAGNOSIS — E669 Obesity, unspecified: Secondary | ICD-10-CM | POA: Diagnosis not present

## 2017-02-18 DIAGNOSIS — Z7984 Long term (current) use of oral hypoglycemic drugs: Secondary | ICD-10-CM | POA: Diagnosis not present

## 2017-02-18 DIAGNOSIS — G4733 Obstructive sleep apnea (adult) (pediatric): Secondary | ICD-10-CM | POA: Diagnosis not present

## 2017-02-18 DIAGNOSIS — M4802 Spinal stenosis, cervical region: Secondary | ICD-10-CM | POA: Insufficient documentation

## 2017-02-18 DIAGNOSIS — Z79899 Other long term (current) drug therapy: Secondary | ICD-10-CM | POA: Diagnosis not present

## 2017-02-18 DIAGNOSIS — Z7982 Long term (current) use of aspirin: Secondary | ICD-10-CM | POA: Diagnosis not present

## 2017-02-18 DIAGNOSIS — M48061 Spinal stenosis, lumbar region without neurogenic claudication: Secondary | ICD-10-CM | POA: Diagnosis not present

## 2017-02-18 DIAGNOSIS — Z85038 Personal history of other malignant neoplasm of large intestine: Secondary | ICD-10-CM | POA: Diagnosis not present

## 2017-02-18 DIAGNOSIS — Z8051 Family history of malignant neoplasm of kidney: Secondary | ICD-10-CM | POA: Diagnosis not present

## 2017-02-18 LAB — CBC WITH DIFFERENTIAL/PLATELET
BASOS ABS: 0 10*3/uL (ref 0–0.1)
Basophils Relative: 1 %
Eosinophils Absolute: 0.1 10*3/uL (ref 0–0.7)
Eosinophils Relative: 3 %
HEMATOCRIT: 30.5 % — AB (ref 35.0–47.0)
HEMOGLOBIN: 10 g/dL — AB (ref 12.0–16.0)
LYMPHS PCT: 27 %
Lymphs Abs: 1.2 10*3/uL (ref 1.0–3.6)
MCH: 32.5 pg (ref 26.0–34.0)
MCHC: 32.8 g/dL (ref 32.0–36.0)
MCV: 99.1 fL (ref 80.0–100.0)
MONO ABS: 1 10*3/uL — AB (ref 0.2–0.9)
Monocytes Relative: 22 %
Neutro Abs: 2.1 10*3/uL (ref 1.4–6.5)
Neutrophils Relative %: 47 %
Platelets: 137 10*3/uL — ABNORMAL LOW (ref 150–440)
RBC: 3.08 MIL/uL — ABNORMAL LOW (ref 3.80–5.20)
RDW: 14.8 % — AB (ref 11.5–14.5)
WBC: 4.4 10*3/uL (ref 3.6–11.0)

## 2017-02-18 NOTE — Progress Notes (Signed)
[facility] OFFICE PROGRESS NOTE  Patient Care Team: Gauger, Victoriano Lain, NP as PCP - General (Internal Medicine)  Cancer Staging No matching staging information was found for the patient.  # IRON DEF ANEMIA/ CKD [Dr.Lateef; Dr.Gittin] sep 2017- Stool/UA/Myeloma work up-NEG;colo- 2010 [per pt] Sep 2017- IV venofer; June 2018-  aranesp  # CKD-III [1.7]; Dr.Lateef   No history exists.     INTERVAL HISTORY:  Sara Bright 76 y.o.  female pleasant patient above history of Chronic anemia/chronic kidney disease; is here for follow-up.  Patient continues to be p.o. iron.  Denies any significant nausea vomiting.  Denies any abdominal discomfort.  Patient complains of worsening shortness of breath in the last few months. Worse with exertion. Denies and chest pain. She denies any significant weight gain or swelling in the legs.   REVIEW OF SYSTEMS:  A complete 10 point review of system is done which is negative except mentioned above/history of present illness.   PAST MEDICAL HISTORY :  Past Medical History:  Diagnosis Date  . Adenoma of colon   . Adenomatous colon polyp   . Anemia   . Cataract   . Cervical stenosis of spinal canal   . Chronic kidney disease   . Diabetes mellitus without complication ([place])   . GERD (gastroesophageal reflux disease)   . Glaucoma   . Heme positive stool   . History of UTI   . Hx of gallstones   . Hyperkalemia   . Hyperlipidemia   . Hyperlipidemia   . Hypertension   . Lumbar stenosis   . Neuropathy associated with endocrine disorder ([place])   . Obesity   . OSA on CPAP   . Osteoarthritis   . Osteoarthritis   . Retinopathy due to secondary diabetes ([place])   . Secondary hyperparathyroidism of renal origin [facility])     PAST SURGICAL HISTORY :   Past Surgical History:  Procedure Laterality Date  . ACDF X2    . BACK SURGERY    . Cataract extraction right    . catarect extraction left    . CHOLECYSTECTOMY    . COLONOSCOPY WITH  PROPOFOL N/A 12/02/2016   Procedure: COLONOSCOPY WITH PROPOFOL;  Surgeon: Manya Silvas, MD;  Location: [facility] ENDOSCOPY;  Service: Endoscopy;  Laterality: N/A;  . colonoscopy with removal lesions by snare    . Endoscoic carpal tunnel release    . ESOPHAGOGASTRODUODENOSCOPY (EGD) WITH PROPOFOL N/A 12/02/2016   Procedure: ESOPHAGOGASTRODUODENOSCOPY (EGD) WITH PROPOFOL;  Surgeon: Manya Silvas, MD;  Location: [facility] ENDOSCOPY;  Service: Endoscopy;  Laterality: N/A;  . Laminectomy posterior lumbar facetectomy and formaninotomy w/Decomp    . Laminectony posterior cervicle decomp w/Facectomy and foraminotomy    . VEIN LIGATION AND STRIPPING Left     FAMILY HISTORY :   Family History  Problem Relation Age of Onset  . Coronary artery disease Mother   . Diabetes type II Mother   . Hypertension Mother   . Tuberculosis Father   . Stroke Father   . Diabetes type II Sister   . Migraines Sister   . Alcohol abuse Brother   . Coronary artery disease Brother   . Diabetes type II Brother   . Kidney cancer Brother   . Kidney disease Brother   . Heart attack Brother   . Hypertension Brother     SOCIAL HISTORY:   Social History   Tobacco Use  . Smoking status: Never Smoker  . Smokeless tobacco: Never Used  Substance  Use Topics  . Alcohol use: No  . Drug use: No    ALLERGIES:  is allergic to hydrocodone-acetaminophen and statins.  MEDICATIONS:  Current Outpatient Medications  Medication Sig Dispense Refill  . acetaminophen (TYLENOL) 500 MG tablet Take by mouth.    Marland Kitchen albuterol (PROVENTIL HFA;VENTOLIN HFA) 108 (90 Base) MCG/ACT inhaler Inhale into the lungs.    Marland Kitchen amLODipine (NORVASC) 5 MG tablet Take 5 mg by mouth daily.     Marland Kitchen aspirin EC 81 MG tablet Take 81 mg by mouth daily.    . ASSURE COMFORT LANCETS 30G MISC     . calcitRIOL (ROCALTROL) 0.25 MCG capsule Take 0.25 mcg by mouth 3 (three) times a week.     . carvedilol (COREG) 6.25 MG tablet Take 6.25 mg by mouth 2 (two) times daily.   12  . ferrous sulfate 325 (65 FE) MG tablet Take 325 mg by mouth daily.    . furosemide (LASIX) 40 MG tablet Take 40 mg by mouth daily.     Marland Kitchen gabapentin (NEURONTIN) 100 MG capsule Take 100 mg by mouth 2 (two) times daily.     Marland Kitchen gemfibrozil (LOPID) 600 MG tablet Take 600 mg by mouth daily.     Marland Kitchen glimepiride (AMARYL) 1 MG tablet Take 1 mg by mouth daily with breakfast.     . Lancet Devices (ADJUSTABLE LANCING DEVICE) MISC     . latanoprost (XALATAN) 0.005 % ophthalmic solution Place 1 drop into both eyes.     Marland Kitchen losartan (COZAAR) 50 MG tablet Take 100 mg by mouth daily.     . Multiple Vitamins-Minerals (CENTRUM SILVER) tablet Take 1 tablet by mouth daily.     . Omega-3 Fatty Acids (FISH OIL) 1000 MG CAPS Take 1,000 mg by mouth daily.     Marland Kitchen omeprazole (PRILOSEC) 20 MG capsule Take 20 mg by mouth daily.     . ONE TOUCH ULTRA TEST test strip     . timolol (TIMOPTIC-XR) 0.5 % ophthalmic gel-forming Place 1 drop into both eyes.     . TRADJENTA 5 MG TABS tablet Take 5 mg by mouth daily.     . pioglitazone (ACTOS) 30 MG tablet Take 30 mg by mouth daily.      No current facility-administered medications for this visit.     PHYSICAL EXAMINATION:  BP (!) 154/72 (BP Location: Left Arm, Patient Position: Sitting)   Pulse 68   Temp 98.1 F (36.7 C) (Tympanic)   Resp 18   Wt 248 lb 12.8 oz (112.9 kg)   BMI 42.71 kg/m   Filed Weights   02/18/17 0959  Weight: 248 lb 12.8 oz (112.9 kg)    GENERAL: Well-nourished well-developed; Alert, no distress and comfortable. Alone.  EYES: no pallor or icterus OROPHARYNX: no thrush or ulceration; good dentition  NECK: supple, no masses felt LYMPH:  no palpable lymphadenopathy in the cervical, axillary or inguinal regions LUNGS: clear to auscultation and  No wheeze or crackles HEART/CVS: regular rate & rhythm and no murmurs; No lower extremity edema ABDOMEN:abdomen soft, non-tender and normal bowel sounds Musculoskeletal:no cyanosis of digits and no  clubbing  PSYCH: alert & oriented x 3 with fluent speech NEURO: no focal motor/sensory deficits SKIN:  no rashes or significant lesions  LABORATORY DATA:  I have reviewed the data as listed    Component Value Date/Time   NA 132 (L) 01/19/2017 1020   NA 137 10/23/2012 2335   K 4.2 01/19/2017 1020   K 4.4 10/23/2012 2335  CL 101 01/19/2017 1020   CL 107 10/23/2012 2335   CO2 25 01/19/2017 1020   CO2 26 10/23/2012 2335   GLUCOSE 163 (H) 01/19/2017 1020   GLUCOSE 179 (H) 10/23/2012 2335   BUN 42 (H) 01/19/2017 1020   BUN 21 (H) 10/23/2012 2335   CREATININE 1.71 (H) 01/19/2017 1020   CREATININE 1.23 10/23/2012 2335   CALCIUM 9.4 01/19/2017 1020   CALCIUM 10.3 (H) 10/23/2012 2335   PROT 7.1 01/19/2017 1020   PROT 7.4 06/16/2011 1144   ALBUMIN 3.6 01/19/2017 1020   ALBUMIN 3.5 06/16/2011 1144   AST 28 01/19/2017 1020   AST 22 08/18/2011 1026   ALT 20 01/19/2017 1020   ALT 63 06/16/2011 1144   ALKPHOS 70 01/19/2017 1020   ALKPHOS 98 06/16/2011 1144   BILITOT 0.8 01/19/2017 1020   BILITOT 0.4 06/16/2011 1144   GFRNONAA 27 (L) 01/19/2017 1020   GFRNONAA 43 (L) 10/23/2012 2335   GFRAA 32 (L) 01/19/2017 1020   GFRAA 50 (L) 10/23/2012 2335    No results found for: SPEP, UPEP  Lab Results  Component Value Date   WBC 4.4 02/18/2017   NEUTROABS 2.1 02/18/2017   HGB 10.0 (L) 02/18/2017   HCT 30.5 (L) 02/18/2017   MCV 99.1 02/18/2017   PLT 137 (L) 02/18/2017      Chemistry      Component Value Date/Time   NA 132 (L) 01/19/2017 1020   NA 137 10/23/2012 2335   K 4.2 01/19/2017 1020   K 4.4 10/23/2012 2335   CL 101 01/19/2017 1020   CL 107 10/23/2012 2335   CO2 25 01/19/2017 1020   CO2 26 10/23/2012 2335   BUN 42 (H) 01/19/2017 1020   BUN 21 (H) 10/23/2012 2335   CREATININE 1.71 (H) 01/19/2017 1020   CREATININE 1.23 10/23/2012 2335      Component Value Date/Time   CALCIUM 9.4 01/19/2017 1020   CALCIUM 10.3 (H) 10/23/2012 2335   ALKPHOS 70 01/19/2017 1020    ALKPHOS 98 06/16/2011 1144   AST 28 01/19/2017 1020   AST 22 08/18/2011 1026   ALT 20 01/19/2017 1020   ALT 63 06/16/2011 1144   BILITOT 0.8 01/19/2017 1020   BILITOT 0.4 06/16/2011 1144     Results for Sara Bright, Sara Bright (MRN [id number]) as of 02/18/2017 10:08  Ref. Range 05/12/2016 08:53 09/01/2016 13:52 11/19/2016 08:42 12/22/2016 10:29 01/19/2017 10:20  Iron Latest Ref Range: 28 - 170 ug/dL 75 67 62 79 54  UIBC Latest Units: ug/dL 259 266 280 236 250  TIBC Latest Ref Range: 250 - 450 ug/dL 334 333 342 315 304  Saturation Ratios Latest Ref Range: 10.4 - 31.8 % 23 20 18 25 18   Ferritin Latest Ref Range: 11 - 307 ng/mL 651 (H) 467 (H) 413 (H) 633 (H) 374 (H)    RADIOGRAPHIC STUDIES: I have personally reviewed the radiological images as listed and agreed with the findings in the report. No results found.   ASSESSMENT & PLAN:  Anemia due to stage 3 chronic kidney disease # IDA/ CKD-hemoglobin today is 10.3  Patient currently taking iron 65 mg BID/aranesp. Ferritin- today is 467 saturation 18%. Patient will continue taking by mouth iron.   # Goal iron saturation is 30%; Nov Iron sat-18%-HOLD venofer today. Hb-10-Will not need Aranesp today.   # plan follow up in 4 months; labs- 1 week prior/possible venofer. H&H/aranesp monthly.   CC; Dr.Lateef/ PCP.   Orders Placed This Encounter  Procedures  .  Hematocrit ([facility])    Standing Status:   Future    Standing Expiration Date:   02/18/2018  . Hemoglobin [facility])    Standing Status:   Future    Standing Expiration Date:   02/18/2018  . Hematocrit ([facility])    Standing Status:   Future    Standing Expiration Date:   02/18/2018  . Hemoglobin [facility])    Standing Status:   Future    Standing Expiration Date:   02/18/2018  . Hemoglobin [facility])    Standing Status:   Future    Standing Expiration Date:   02/18/2018  . Hematocrit ([facility])    Standing Status:   Future    Standing Expiration Date:   02/18/2018  . CBC with Differential    Standing  Status:   Future    Standing Expiration Date:   02/18/2018  . Basic metabolic panel    Standing Status:   Future    Standing Expiration Date:   02/18/2018  . Ferritin    Standing Status:   Future    Standing Expiration Date:   02/18/2018  . Iron and TIBC    Standing Status:   Future    Standing Expiration Date:   02/18/2018   All questions were answered. The patient knows to call the clinic with any problems, questions or concerns.      Cammie Sickle, MD 02/19/2017 1:09 PM

## 2017-02-18 NOTE — Assessment & Plan Note (Addendum)
#   IDA/ CKD-hemoglobin today is 10.3  Patient currently taking iron 65 mg BID/aranesp. Ferritin- today is 467 saturation 18%. Patient will continue taking by mouth iron.   # Goal iron saturation is 30%; Nov Iron sat-18%-HOLD venofer today. Hb-10-Will not need Aranesp today.   # plan follow up in 4 months; labs- 1 week prior/possible venofer. H&H/aranesp monthly.   CC; Dr.Lateef/ PCP.

## 2017-02-19 DIAGNOSIS — G4733 Obstructive sleep apnea (adult) (pediatric): Secondary | ICD-10-CM | POA: Diagnosis not present

## 2017-02-24 DIAGNOSIS — R0602 Shortness of breath: Secondary | ICD-10-CM | POA: Diagnosis not present

## 2017-02-24 DIAGNOSIS — Z9989 Dependence on other enabling machines and devices: Secondary | ICD-10-CM | POA: Diagnosis not present

## 2017-02-24 DIAGNOSIS — G4733 Obstructive sleep apnea (adult) (pediatric): Secondary | ICD-10-CM | POA: Diagnosis not present

## 2017-02-24 DIAGNOSIS — J4520 Mild intermittent asthma, uncomplicated: Secondary | ICD-10-CM | POA: Diagnosis not present

## 2017-02-24 DIAGNOSIS — Z6841 Body Mass Index (BMI) 40.0 and over, adult: Secondary | ICD-10-CM | POA: Diagnosis not present

## 2017-03-23 ENCOUNTER — Inpatient Hospital Stay: Payer: Medicare HMO | Attending: Internal Medicine

## 2017-03-23 ENCOUNTER — Inpatient Hospital Stay: Payer: Medicare HMO

## 2017-03-23 VITALS — BP 150/59 | HR 70 | Temp 97.1°F | Resp 21

## 2017-03-23 DIAGNOSIS — D508 Other iron deficiency anemias: Secondary | ICD-10-CM

## 2017-03-23 DIAGNOSIS — N183 Chronic kidney disease, stage 3 unspecified: Secondary | ICD-10-CM

## 2017-03-23 DIAGNOSIS — D631 Anemia in chronic kidney disease: Secondary | ICD-10-CM

## 2017-03-23 LAB — HEMATOCRIT: HCT: 29.1 % — ABNORMAL LOW (ref 35.0–47.0)

## 2017-03-23 LAB — HEMOGLOBIN: Hemoglobin: 9.8 g/dL — ABNORMAL LOW (ref 12.0–16.0)

## 2017-03-23 MED ORDER — DARBEPOETIN ALFA 200 MCG/0.4ML IJ SOSY
200.0000 ug | PREFILLED_SYRINGE | Freq: Once | INTRAMUSCULAR | Status: AC
  Administered 2017-03-23: 200 ug via SUBCUTANEOUS

## 2017-04-20 ENCOUNTER — Inpatient Hospital Stay: Payer: Medicare HMO | Attending: Internal Medicine

## 2017-04-20 ENCOUNTER — Inpatient Hospital Stay: Payer: Medicare HMO

## 2017-04-20 DIAGNOSIS — N183 Chronic kidney disease, stage 3 (moderate): Secondary | ICD-10-CM | POA: Diagnosis not present

## 2017-04-20 DIAGNOSIS — D631 Anemia in chronic kidney disease: Secondary | ICD-10-CM | POA: Insufficient documentation

## 2017-04-20 DIAGNOSIS — D508 Other iron deficiency anemias: Secondary | ICD-10-CM | POA: Diagnosis not present

## 2017-04-20 LAB — HEMATOCRIT: HEMATOCRIT: 34.2 % — AB (ref 35.0–47.0)

## 2017-04-20 LAB — HEMOGLOBIN: Hemoglobin: 11.3 g/dL — ABNORMAL LOW (ref 12.0–16.0)

## 2017-05-18 ENCOUNTER — Inpatient Hospital Stay: Payer: Medicare HMO | Attending: Internal Medicine

## 2017-05-18 ENCOUNTER — Inpatient Hospital Stay: Payer: Medicare HMO

## 2017-05-18 DIAGNOSIS — N183 Chronic kidney disease, stage 3 unspecified: Secondary | ICD-10-CM

## 2017-05-18 DIAGNOSIS — D508 Other iron deficiency anemias: Secondary | ICD-10-CM | POA: Diagnosis not present

## 2017-05-18 DIAGNOSIS — D631 Anemia in chronic kidney disease: Secondary | ICD-10-CM | POA: Insufficient documentation

## 2017-05-18 LAB — HEMOGLOBIN: HEMOGLOBIN: 10.4 g/dL — AB (ref 12.0–16.0)

## 2017-05-18 LAB — HEMATOCRIT: HEMATOCRIT: 31 % — AB (ref 35.0–47.0)

## 2017-06-22 ENCOUNTER — Other Ambulatory Visit: Payer: Self-pay

## 2017-06-22 ENCOUNTER — Inpatient Hospital Stay (HOSPITAL_BASED_OUTPATIENT_CLINIC_OR_DEPARTMENT_OTHER): Payer: Medicare HMO | Admitting: Internal Medicine

## 2017-06-22 ENCOUNTER — Inpatient Hospital Stay: Payer: Medicare HMO | Attending: Internal Medicine

## 2017-06-22 ENCOUNTER — Inpatient Hospital Stay: Payer: Medicare HMO

## 2017-06-22 VITALS — BP 155/75 | HR 61 | Temp 97.9°F | Resp 22

## 2017-06-22 VITALS — BP 151/79 | HR 59 | Resp 20

## 2017-06-22 DIAGNOSIS — Z79899 Other long term (current) drug therapy: Secondary | ICD-10-CM | POA: Insufficient documentation

## 2017-06-22 DIAGNOSIS — D509 Iron deficiency anemia, unspecified: Secondary | ICD-10-CM | POA: Insufficient documentation

## 2017-06-22 DIAGNOSIS — D631 Anemia in chronic kidney disease: Secondary | ICD-10-CM | POA: Insufficient documentation

## 2017-06-22 DIAGNOSIS — E119 Type 2 diabetes mellitus without complications: Secondary | ICD-10-CM | POA: Diagnosis not present

## 2017-06-22 DIAGNOSIS — Z7982 Long term (current) use of aspirin: Secondary | ICD-10-CM | POA: Insufficient documentation

## 2017-06-22 DIAGNOSIS — Z7984 Long term (current) use of oral hypoglycemic drugs: Secondary | ICD-10-CM | POA: Insufficient documentation

## 2017-06-22 DIAGNOSIS — N183 Chronic kidney disease, stage 3 unspecified: Secondary | ICD-10-CM

## 2017-06-22 DIAGNOSIS — D508 Other iron deficiency anemias: Secondary | ICD-10-CM

## 2017-06-22 LAB — BASIC METABOLIC PANEL
Anion gap: 8 (ref 5–15)
BUN: 51 mg/dL — AB (ref 6–20)
CALCIUM: 9.8 mg/dL (ref 8.9–10.3)
CO2: 23 mmol/L (ref 22–32)
Chloride: 105 mmol/L (ref 101–111)
Creatinine, Ser: 1.47 mg/dL — ABNORMAL HIGH (ref 0.44–1.00)
GFR calc Af Amer: 38 mL/min — ABNORMAL LOW (ref >60)
GFR, EST NON AFRICAN AMERICAN: 33 mL/min — AB (ref >60)
GLUCOSE: 178 mg/dL — AB (ref 65–99)
Potassium: 4.7 mmol/L (ref 3.5–5.1)
SODIUM: 136 mmol/L (ref 135–145)

## 2017-06-22 LAB — CBC WITH DIFFERENTIAL/PLATELET
BASOS ABS: 0 10*3/uL (ref 0–0.1)
BASOS PCT: 1 %
EOS ABS: 0.3 10*3/uL (ref 0–0.7)
EOS PCT: 6 %
HCT: 30.3 % — ABNORMAL LOW (ref 35.0–47.0)
Hemoglobin: 10 g/dL — ABNORMAL LOW (ref 12.0–16.0)
Lymphocytes Relative: 25 %
Lymphs Abs: 1.2 10*3/uL (ref 1.0–3.6)
MCH: 32.3 pg (ref 26.0–34.0)
MCHC: 33.2 g/dL (ref 32.0–36.0)
MCV: 97.5 fL (ref 80.0–100.0)
MONO ABS: 0.5 10*3/uL (ref 0.2–0.9)
Monocytes Relative: 11 %
Neutro Abs: 2.7 10*3/uL (ref 1.4–6.5)
Neutrophils Relative %: 57 %
Platelets: 160 10*3/uL (ref 150–440)
RBC: 3.1 MIL/uL — ABNORMAL LOW (ref 3.80–5.20)
RDW: 14.2 % (ref 11.5–14.5)
WBC: 4.8 10*3/uL (ref 3.6–11.0)

## 2017-06-22 LAB — IRON AND TIBC
IRON: 72 ug/dL (ref 28–170)
Saturation Ratios: 21 % (ref 10.4–31.8)
TIBC: 345 ug/dL (ref 250–450)
UIBC: 273 ug/dL

## 2017-06-22 LAB — FERRITIN: FERRITIN: 480 ng/mL — AB (ref 11–307)

## 2017-06-22 MED ORDER — SODIUM CHLORIDE 0.9 % IV SOLN
Freq: Once | INTRAVENOUS | Status: AC
  Administered 2017-06-22: 12:00:00 via INTRAVENOUS
  Filled 2017-06-22: qty 1000

## 2017-06-22 MED ORDER — IRON SUCROSE 20 MG/ML IV SOLN
200.0000 mg | Freq: Once | INTRAVENOUS | Status: AC
  Administered 2017-06-22: 200 mg via INTRAVENOUS
  Filled 2017-06-22: qty 10

## 2017-06-22 NOTE — Patient Instructions (Signed)
Iron Sucrose injection What is this medicine? IRON SUCROSE (AHY ern SOO krohs) is an iron complex. Iron is used to make healthy red blood cells, which carry oxygen and nutrients throughout the body. This medicine is used to treat iron deficiency anemia in people with chronic kidney disease. This medicine may be used for other purposes; ask your health care provider or pharmacist if you have questions. COMMON BRAND NAME(S): Venofer What should I tell my health care provider before I take this medicine? They need to know if you have any of these conditions: -anemia not caused by low iron levels -heart disease -high levels of iron in the blood -kidney disease -liver disease -an unusual or allergic reaction to iron, other medicines, foods, dyes, or preservatives -pregnant or trying to get pregnant -breast-feeding How should I use this medicine? This medicine is for infusion into a vein. It is given by a health care professional in a hospital or clinic setting. Talk to your pediatrician regarding the use of this medicine in children. While this drug may be prescribed for children as young as 2 years for selected conditions, precautions do apply. Overdosage: If you think you have taken too much of this medicine contact a poison control center or emergency room at once. NOTE: This medicine is only for you. Do not share this medicine with others. What if I miss a dose? It is important not to miss your dose. Call your doctor or health care professional if you are unable to keep an appointment. What may interact with this medicine? Do not take this medicine with any of the following medications: -deferoxamine -dimercaprol -other iron products This medicine may also interact with the following medications: -chloramphenicol -deferasirox This list may not describe all possible interactions. Give your health care provider a list of all the medicines, herbs, non-prescription drugs, or dietary  supplements you use. Also tell them if you smoke, drink alcohol, or use illegal drugs. Some items may interact with your medicine. What should I watch for while using this medicine? Visit your doctor or healthcare professional regularly. Tell your doctor or healthcare professional if your symptoms do not start to get better or if they get worse. You may need blood work done while you are taking this medicine. You may need to follow a special diet. Talk to your doctor. Foods that contain iron include: whole grains/cereals, dried fruits, beans, or peas, leafy green vegetables, and organ meats (liver, kidney). What side effects may I notice from receiving this medicine? Side effects that you should report to your doctor or health care professional as soon as possible: -allergic reactions like skin rash, itching or hives, swelling of the face, lips, or tongue -breathing problems -changes in blood pressure -cough -fast, irregular heartbeat -feeling faint or lightheaded, falls -fever or chills -flushing, sweating, or hot feelings -joint or muscle aches/pains -seizures -swelling of the ankles or feet -unusually weak or tired Side effects that usually do not require medical attention (report to your doctor or health care professional if they continue or are bothersome): -diarrhea -feeling achy -headache -irritation at site where injected -nausea, vomiting -stomach upset -tiredness This list may not describe all possible side effects. Call your doctor for medical advice about side effects. You may report side effects to FDA at 1-800-FDA-1088. Where should I keep my medicine? This drug is given in a hospital or clinic and will not be stored at home. NOTE: This sheet is a summary. It may not cover all possible information. If   you have questions about this medicine, talk to your doctor, pharmacist, or health care provider.  2018 Elsevier/Gold Standard (2010-12-04 17:14:35)  

## 2017-06-22 NOTE — Assessment & Plan Note (Addendum)
#   IDA/ CKD-hemoglobin today is 10.0  Patient currently taking iron 65 mg BID/aranesp as needed > hemoglobin 10.  # Goal iron saturation is 30%; Nov 2018- Iron sat-18%-plan Venofer today.  Hb-10-Will not need Aranesp today.   # plan follow up in 4 months; labs- 1 week prior/possible venofer [poor IV access]. H&H/aranesp monthly.   CC; Dr.Lateef/ PCP.

## 2017-06-22 NOTE — Progress Notes (Signed)
[facility] OFFICE PROGRESS NOTE  Patient Care Team: Gauger, Victoriano Lain, NP as PCP - General (Internal Medicine)  Cancer Staging No matching staging information was found for the patient.  # IRON DEF ANEMIA/ CKD [Dr.Lateef; Dr.Gittin] sep 2017- Stool/UA/Myeloma work up-NEG;[facility]- 2010 [per pt] Sep 2017- IV venofer; June 2018-  aranesp  # CKD-III [1.7]; Dr.Lateef   No history exists.     INTERVAL HISTORY:  Sara Bright 77 y.o.  female pleasant patient above history of Chronic anemia/chronic kidney disease; is here for follow-up.    Patient complains of mild to moderate fatigue.  Complains of mild shortness of breath on exertion.  Patient continues to be p.o. iron.  Denies any significant nausea vomiting.  Denies any abdominal discomfort.  She denies any significant weight gain or swelling in the legs.   REVIEW OF SYSTEMS:  A complete 10 point review of system is done which is negative except mentioned above/history of present illness.   PAST MEDICAL HISTORY :  Past Medical History:  Diagnosis Date  . Adenoma of colon   . Adenomatous colon polyp   . Anemia   . Cataract   . Cervical stenosis of spinal canal   . Chronic kidney disease   . Diabetes mellitus without complication ([place])   . GERD (gastroesophageal reflux disease)   . Glaucoma   . Heme positive stool   . History of UTI   . Hx of gallstones   . Hyperkalemia   . Hyperlipidemia   . Hyperlipidemia   . Hypertension   . Lumbar stenosis   . Neuropathy associated with endocrine disorder ([place])   . Obesity   . OSA on CPAP   . Osteoarthritis   . Osteoarthritis   . Retinopathy due to secondary diabetes ([place])   . Secondary hyperparathyroidism of renal origin [facility])     PAST SURGICAL HISTORY :   Past Surgical History:  Procedure Laterality Date  . ACDF X2    . BACK SURGERY    . Cataract extraction right    . catarect extraction left    . CHOLECYSTECTOMY    . COLONOSCOPY WITH PROPOFOL N/A  12/02/2016   Procedure: COLONOSCOPY WITH PROPOFOL;  Surgeon: Manya Silvas, MD;  Location: [facility] ENDOSCOPY;  Service: Endoscopy;  Laterality: N/A;  . colonoscopy with removal lesions by snare    . Endoscoic carpal tunnel release    . ESOPHAGOGASTRODUODENOSCOPY (EGD) WITH PROPOFOL N/A 12/02/2016   Procedure: ESOPHAGOGASTRODUODENOSCOPY (EGD) WITH PROPOFOL;  Surgeon: Manya Silvas, MD;  Location: [facility] ENDOSCOPY;  Service: Endoscopy;  Laterality: N/A;  . Laminectomy posterior lumbar facetectomy and formaninotomy w/Decomp    . Laminectony posterior cervicle decomp w/Facectomy and foraminotomy    . VEIN LIGATION AND STRIPPING Left     FAMILY HISTORY :   Family History  Problem Relation Age of Onset  . Coronary artery disease Mother   . Diabetes type II Mother   . Hypertension Mother   . Tuberculosis Father   . Stroke Father   . Diabetes type II Sister   . Migraines Sister   . Alcohol abuse Brother   . Coronary artery disease Brother   . Diabetes type II Brother   . Kidney cancer Brother   . Kidney disease Brother   . Heart attack Brother   . Hypertension Brother     SOCIAL HISTORY:   Social History   Tobacco Use  . Smoking status: Never Smoker  . Smokeless tobacco: Never Used  Substance  Use Topics  . Alcohol use: No  . Drug use: No    ALLERGIES:  is allergic to hydrocodone-acetaminophen and statins.  MEDICATIONS:  Current Outpatient Medications  Medication Sig Dispense Refill  . acetaminophen (TYLENOL) 500 MG tablet Take by mouth.    Marland Kitchen amLODipine (NORVASC) 5 MG tablet Take 5 mg by mouth daily.     Marland Kitchen aspirin EC 81 MG tablet Take 81 mg by mouth daily.    . ASSURE COMFORT LANCETS 30G MISC     . calcitRIOL (ROCALTROL) 0.25 MCG capsule Take 0.25 mcg by mouth 3 (three) times a week.     . carvedilol (COREG) 6.25 MG tablet Take 6.25 mg by mouth 2 (two) times daily.  12  . ferrous sulfate 325 (65 FE) MG tablet Take 325 mg by mouth daily.    . furosemide (LASIX) 40 MG  tablet Take 40 mg by mouth daily.     Marland Kitchen gabapentin (NEURONTIN) 100 MG capsule Take 100 mg by mouth 2 (two) times daily.     Marland Kitchen gemfibrozil (LOPID) 600 MG tablet Take 600 mg by mouth daily.     Marland Kitchen glimepiride (AMARYL) 1 MG tablet Take 1 mg by mouth daily with breakfast.     . Lancet Devices (ADJUSTABLE LANCING DEVICE) MISC     . latanoprost (XALATAN) 0.005 % ophthalmic solution Place 1 drop into both eyes.     Marland Kitchen losartan (COZAAR) 50 MG tablet Take 100 mg by mouth daily.     . Multiple Vitamins-Minerals (CENTRUM SILVER) tablet Take 1 tablet by mouth daily.     . Omega-3 Fatty Acids (FISH OIL) 1000 MG CAPS Take 1,000 mg by mouth daily.     Marland Kitchen omeprazole (PRILOSEC) 20 MG capsule Take 20 mg by mouth daily.     . ONE TOUCH ULTRA TEST test strip     . pioglitazone (ACTOS) 30 MG tablet Take 30 mg by mouth daily.     . timolol (TIMOPTIC-XR) 0.5 % ophthalmic gel-forming Place 1 drop into both eyes.      No current facility-administered medications for this visit.     PHYSICAL EXAMINATION:  BP (!) 155/75   Pulse 61   Temp 97.9 F (36.6 C) (Tympanic)   Resp (!) 22   There were no vitals filed for this visit.  GENERAL: Well-nourished well-developed; Alert, no distress and comfortable. Alone.  EYES: no pallor or icterus OROPHARYNX: no thrush or ulceration; good dentition  NECK: supple, no masses felt LYMPH:  no palpable lymphadenopathy in the cervical, axillary or inguinal regions LUNGS: clear to auscultation and  No wheeze or crackles HEART/CVS: regular rate & rhythm and no murmurs; No lower extremity edema ABDOMEN:abdomen soft, non-tender and normal bowel sounds Musculoskeletal:no cyanosis of digits and no clubbing  PSYCH: alert & oriented x 3 with fluent speech NEURO: no focal motor/sensory deficits SKIN:  no rashes or significant lesions  LABORATORY DATA:  I have reviewed the data as listed    Component Value Date/Time   NA 136 06/22/2017 1016   NA 137 10/23/2012 2335   K 4.7  06/22/2017 1016   K 4.4 10/23/2012 2335   CL 105 06/22/2017 1016   CL 107 10/23/2012 2335   CO2 23 06/22/2017 1016   CO2 26 10/23/2012 2335   GLUCOSE 178 (H) 06/22/2017 1016   GLUCOSE 179 (H) 10/23/2012 2335   BUN 51 (H) 06/22/2017 1016   BUN 21 (H) 10/23/2012 2335   CREATININE 1.47 (H) 06/22/2017 1016   CREATININE  1.23 10/23/2012 2335   CALCIUM 9.8 06/22/2017 1016   CALCIUM 10.3 (H) 10/23/2012 2335   PROT 7.1 01/19/2017 1020   PROT 7.4 06/16/2011 1144   ALBUMIN 3.6 01/19/2017 1020   ALBUMIN 3.5 06/16/2011 1144   AST 28 01/19/2017 1020   AST 22 08/18/2011 1026   ALT 20 01/19/2017 1020   ALT 63 06/16/2011 1144   ALKPHOS 70 01/19/2017 1020   ALKPHOS 98 06/16/2011 1144   BILITOT 0.8 01/19/2017 1020   BILITOT 0.4 06/16/2011 1144   GFRNONAA 33 (L) 06/22/2017 1016   GFRNONAA 43 (L) 10/23/2012 2335   GFRAA 38 (L) 06/22/2017 1016   GFRAA 50 (L) 10/23/2012 2335    No results found for: SPEP, UPEP  Lab Results  Component Value Date   WBC 4.8 06/22/2017   NEUTROABS 2.7 06/22/2017   HGB 10.0 (L) 06/22/2017   HCT 30.3 (L) 06/22/2017   MCV 97.5 06/22/2017   PLT 160 06/22/2017      Chemistry      Component Value Date/Time   NA 136 06/22/2017 1016   NA 137 10/23/2012 2335   K 4.7 06/22/2017 1016   K 4.4 10/23/2012 2335   CL 105 06/22/2017 1016   CL 107 10/23/2012 2335   CO2 23 06/22/2017 1016   CO2 26 10/23/2012 2335   BUN 51 (H) 06/22/2017 1016   BUN 21 (H) 10/23/2012 2335   CREATININE 1.47 (H) 06/22/2017 1016   CREATININE 1.23 10/23/2012 2335      Component Value Date/Time   CALCIUM 9.8 06/22/2017 1016   CALCIUM 10.3 (H) 10/23/2012 2335   ALKPHOS 70 01/19/2017 1020   ALKPHOS 98 06/16/2011 1144   AST 28 01/19/2017 1020   AST 22 08/18/2011 1026   ALT 20 01/19/2017 1020   ALT 63 06/16/2011 1144   BILITOT 0.8 01/19/2017 1020   BILITOT 0.4 06/16/2011 1144     Results for JUDIANN, CELIA (MRN [id number]) as of 02/18/2017 10:08  Ref. Range 05/12/2016 08:53 09/01/2016  13:52 11/19/2016 08:42 12/22/2016 10:29 01/19/2017 10:20  Iron Latest Ref Range: 28 - 170 ug/dL 75 67 62 79 54  UIBC Latest Units: ug/dL 259 266 280 236 250  TIBC Latest Ref Range: 250 - 450 ug/dL 334 333 342 315 304  Saturation Ratios Latest Ref Range: 10.4 - 31.8 % 23 20 18 25 18   Ferritin Latest Ref Range: 11 - 307 ng/mL 651 (H) 467 (H) 413 (H) 633 (H) 374 (H)    RADIOGRAPHIC STUDIES: I have personally reviewed the radiological images as listed and agreed with the findings in the report. No results found.   ASSESSMENT & PLAN:  Anemia due to stage 3 chronic kidney disease # IDA/ CKD-hemoglobin today is 10.0  Patient currently taking iron 65 mg BID/aranesp as needed > hemoglobin 10.  # Goal iron saturation is 30%; Nov 2018- Iron sat-18%-plan Venofer today.  Hb-10-Will not need Aranesp today.   # plan follow up in 4 months; labs- 1 week prior/possible venofer [poor IV access]. H&H/aranesp monthly.   CC; Dr.Lateef/ PCP.   Orders Placed This Encounter  Procedures  . Hemoglobin [facility])    Standing Status:   Standing    Number of Occurrences:   10    Standing Expiration Date:   06/23/2018  . Hematocrit ([facility])    Standing Status:   Standing    Number of Occurrences:   10    Standing Expiration Date:   06/23/2018  . CBC with Differential/Platelet  Standing Status:   Future    Standing Expiration Date:   06/23/2018  . Comprehensive metabolic panel    Standing Status:   Future    Standing Expiration Date:   06/23/2018  . Iron and TIBC    Standing Status:   Future    Standing Expiration Date:   06/23/2018  . Ferritin    Standing Status:   Future    Standing Expiration Date:   06/23/2018   All questions were answered. The patient knows to call the clinic with any problems, questions or concerns.      Cammie Sickle, MD 06/22/2017 1:18 PM

## 2017-07-20 ENCOUNTER — Inpatient Hospital Stay: Payer: Medicare HMO | Attending: Internal Medicine

## 2017-07-20 ENCOUNTER — Inpatient Hospital Stay: Payer: Medicare HMO

## 2017-07-20 VITALS — BP 148/78 | HR 66 | Temp 94.6°F | Resp 20

## 2017-07-20 DIAGNOSIS — D631 Anemia in chronic kidney disease: Secondary | ICD-10-CM | POA: Insufficient documentation

## 2017-07-20 DIAGNOSIS — N183 Chronic kidney disease, stage 3 unspecified: Secondary | ICD-10-CM

## 2017-07-20 LAB — HEMOGLOBIN: Hemoglobin: 9.6 g/dL — ABNORMAL LOW (ref 12.0–16.0)

## 2017-07-20 LAB — HEMATOCRIT: HCT: 29 % — ABNORMAL LOW (ref 35.0–47.0)

## 2017-07-20 MED ORDER — DARBEPOETIN ALFA 200 MCG/0.4ML IJ SOSY
200.0000 ug | PREFILLED_SYRINGE | Freq: Once | INTRAMUSCULAR | Status: AC
  Administered 2017-07-20: 200 ug via SUBCUTANEOUS

## 2017-07-20 NOTE — Patient Instructions (Signed)

## 2017-08-12 DIAGNOSIS — B351 Tinea unguium: Secondary | ICD-10-CM | POA: Diagnosis not present

## 2017-08-12 DIAGNOSIS — L600 Ingrowing nail: Secondary | ICD-10-CM | POA: Diagnosis not present

## 2017-08-12 DIAGNOSIS — M722 Plantar fascial fibromatosis: Secondary | ICD-10-CM | POA: Diagnosis not present

## 2017-08-17 ENCOUNTER — Inpatient Hospital Stay: Payer: Medicare HMO

## 2017-08-17 ENCOUNTER — Inpatient Hospital Stay: Payer: Medicare HMO | Attending: Internal Medicine

## 2017-08-17 VITALS — BP 145/74 | HR 80 | Temp 97.1°F | Resp 20

## 2017-08-17 DIAGNOSIS — N183 Chronic kidney disease, stage 3 unspecified: Secondary | ICD-10-CM

## 2017-08-17 DIAGNOSIS — D631 Anemia in chronic kidney disease: Secondary | ICD-10-CM | POA: Diagnosis present

## 2017-08-17 LAB — HEMATOCRIT: HCT: 26.4 % — ABNORMAL LOW (ref 35.0–47.0)

## 2017-08-17 LAB — HEMOGLOBIN: HEMOGLOBIN: 8.7 g/dL — AB (ref 12.0–16.0)

## 2017-08-17 MED ORDER — DARBEPOETIN ALFA 200 MCG/0.4ML IJ SOSY
200.0000 ug | PREFILLED_SYRINGE | Freq: Once | INTRAMUSCULAR | Status: AC
  Administered 2017-08-17: 200 ug via SUBCUTANEOUS

## 2017-08-20 DIAGNOSIS — G4733 Obstructive sleep apnea (adult) (pediatric): Secondary | ICD-10-CM | POA: Diagnosis not present

## 2017-08-25 DIAGNOSIS — R0609 Other forms of dyspnea: Secondary | ICD-10-CM | POA: Diagnosis not present

## 2017-08-25 DIAGNOSIS — J45998 Other asthma: Secondary | ICD-10-CM | POA: Diagnosis not present

## 2017-08-25 DIAGNOSIS — G4733 Obstructive sleep apnea (adult) (pediatric): Secondary | ICD-10-CM | POA: Diagnosis not present

## 2017-09-14 ENCOUNTER — Inpatient Hospital Stay: Payer: Medicare HMO

## 2017-09-14 ENCOUNTER — Inpatient Hospital Stay: Payer: Medicare HMO | Attending: Internal Medicine

## 2017-09-14 VITALS — BP 120/61 | HR 57 | Temp 96.9°F | Resp 18

## 2017-09-14 DIAGNOSIS — R5383 Other fatigue: Secondary | ICD-10-CM | POA: Diagnosis not present

## 2017-09-14 DIAGNOSIS — D631 Anemia in chronic kidney disease: Secondary | ICD-10-CM | POA: Insufficient documentation

## 2017-09-14 DIAGNOSIS — Z7982 Long term (current) use of aspirin: Secondary | ICD-10-CM | POA: Diagnosis not present

## 2017-09-14 DIAGNOSIS — D500 Iron deficiency anemia secondary to blood loss (chronic): Secondary | ICD-10-CM | POA: Diagnosis present

## 2017-09-14 DIAGNOSIS — N183 Chronic kidney disease, stage 3 unspecified: Secondary | ICD-10-CM

## 2017-09-14 DIAGNOSIS — E119 Type 2 diabetes mellitus without complications: Secondary | ICD-10-CM | POA: Insufficient documentation

## 2017-09-14 DIAGNOSIS — Z7984 Long term (current) use of oral hypoglycemic drugs: Secondary | ICD-10-CM | POA: Diagnosis not present

## 2017-09-14 DIAGNOSIS — Z79899 Other long term (current) drug therapy: Secondary | ICD-10-CM | POA: Diagnosis not present

## 2017-09-14 LAB — HEMOGLOBIN: HEMOGLOBIN: 6.9 g/dL — AB (ref 12.0–16.0)

## 2017-09-14 LAB — HEMATOCRIT: HCT: 21.3 % — ABNORMAL LOW (ref 35.0–47.0)

## 2017-09-14 MED ORDER — IRON SUCROSE 20 MG/ML IV SOLN
INTRAVENOUS | Status: AC
  Filled 2017-09-14: qty 10

## 2017-09-14 MED ORDER — SODIUM CHLORIDE 0.9 % IV SOLN
Freq: Once | INTRAVENOUS | Status: AC
  Administered 2017-09-14: 10:00:00 via INTRAVENOUS
  Filled 2017-09-14: qty 1000

## 2017-09-14 MED ORDER — IRON SUCROSE 20 MG/ML IV SOLN
200.0000 mg | Freq: Once | INTRAVENOUS | Status: AC
  Administered 2017-09-14: 200 mg via INTRAVENOUS
  Filled 2017-09-14: qty 10

## 2017-09-14 NOTE — Progress Notes (Signed)
Patient here for aranesp injection. Hgb 6.9 this am. MD notified. Orders for Venofer IV infusion today and aranesp injection tomorrow. Denies weakness. Some  Mild dyspnea with exertion.

## 2017-09-14 NOTE — Patient Instructions (Signed)
Iron Sucrose injection What is this medicine? IRON SUCROSE (AHY ern SOO krohs) is an iron complex. Iron is used to make healthy red blood cells, which carry oxygen and nutrients throughout the body. This medicine is used to treat iron deficiency anemia in people with chronic kidney disease. This medicine may be used for other purposes; ask your health care provider or pharmacist if you have questions. COMMON BRAND NAME(S): Venofer What should I tell my health care provider before I take this medicine? They need to know if you have any of these conditions: -anemia not caused by low iron levels -heart disease -high levels of iron in the blood -kidney disease -liver disease -an unusual or allergic reaction to iron, other medicines, foods, dyes, or preservatives -pregnant or trying to get pregnant -breast-feeding How should I use this medicine? This medicine is for infusion into a vein. It is given by a health care professional in a hospital or clinic setting. Talk to your pediatrician regarding the use of this medicine in children. While this drug may be prescribed for children as young as 2 years for selected conditions, precautions do apply. Overdosage: If you think you have taken too much of this medicine contact a poison control center or emergency room at once. NOTE: This medicine is only for you. Do not share this medicine with others. What if I miss a dose? It is important not to miss your dose. Call your doctor or health care professional if you are unable to keep an appointment. What may interact with this medicine? Do not take this medicine with any of the following medications: -deferoxamine -dimercaprol -other iron products This medicine may also interact with the following medications: -chloramphenicol -deferasirox This list may not describe all possible interactions. Give your health care provider a list of all the medicines, herbs, non-prescription drugs, or dietary  supplements you use. Also tell them if you smoke, drink alcohol, or use illegal drugs. Some items may interact with your medicine. What should I watch for while using this medicine? Visit your doctor or healthcare professional regularly. Tell your doctor or healthcare professional if your symptoms do not start to get better or if they get worse. You may need blood work done while you are taking this medicine. You may need to follow a special diet. Talk to your doctor. Foods that contain iron include: whole grains/cereals, dried fruits, beans, or peas, leafy green vegetables, and organ meats (liver, kidney). What side effects may I notice from receiving this medicine? Side effects that you should report to your doctor or health care professional as soon as possible: -allergic reactions like skin rash, itching or hives, swelling of the face, lips, or tongue -breathing problems -changes in blood pressure -cough -fast, irregular heartbeat -feeling faint or lightheaded, falls -fever or chills -flushing, sweating, or hot feelings -joint or muscle aches/pains -seizures -swelling of the ankles or feet -unusually weak or tired Side effects that usually do not require medical attention (report to your doctor or health care professional if they continue or are bothersome): -diarrhea -feeling achy -headache -irritation at site where injected -nausea, vomiting -stomach upset -tiredness This list may not describe all possible side effects. Call your doctor for medical advice about side effects. You may report side effects to FDA at 1-800-FDA-1088. Where should I keep my medicine? This drug is given in a hospital or clinic and will not be stored at home. NOTE: This sheet is a summary. It may not cover all possible information. If   you have questions about this medicine, talk to your doctor, pharmacist, or health care provider.  2018 Elsevier/Gold Standard (2010-12-04 17:14:35)  

## 2017-09-15 ENCOUNTER — Inpatient Hospital Stay: Payer: Medicare HMO

## 2017-09-15 VITALS — BP 152/70 | HR 77 | Temp 98.7°F | Resp 18

## 2017-09-15 DIAGNOSIS — D631 Anemia in chronic kidney disease: Secondary | ICD-10-CM

## 2017-09-15 DIAGNOSIS — N183 Chronic kidney disease, stage 3 (moderate): Principal | ICD-10-CM

## 2017-09-15 MED ORDER — DARBEPOETIN ALFA 200 MCG/0.4ML IJ SOSY
200.0000 ug | PREFILLED_SYRINGE | Freq: Once | INTRAMUSCULAR | Status: AC
  Administered 2017-09-15: 200 ug via SUBCUTANEOUS

## 2017-09-15 NOTE — Patient Instructions (Signed)

## 2017-09-19 DIAGNOSIS — G4733 Obstructive sleep apnea (adult) (pediatric): Secondary | ICD-10-CM | POA: Diagnosis not present

## 2017-09-22 ENCOUNTER — Other Ambulatory Visit: Payer: Self-pay | Admitting: *Deleted

## 2017-09-22 ENCOUNTER — Encounter: Payer: Self-pay | Admitting: Internal Medicine

## 2017-09-22 ENCOUNTER — Other Ambulatory Visit: Payer: Self-pay

## 2017-09-22 ENCOUNTER — Inpatient Hospital Stay (HOSPITAL_BASED_OUTPATIENT_CLINIC_OR_DEPARTMENT_OTHER): Payer: Medicare HMO | Admitting: Internal Medicine

## 2017-09-22 ENCOUNTER — Inpatient Hospital Stay: Payer: Medicare HMO

## 2017-09-22 VITALS — BP 115/70 | HR 78 | Temp 97.9°F | Resp 24 | Ht 64.0 in | Wt 255.0 lb

## 2017-09-22 DIAGNOSIS — Z7982 Long term (current) use of aspirin: Secondary | ICD-10-CM | POA: Diagnosis not present

## 2017-09-22 DIAGNOSIS — D631 Anemia in chronic kidney disease: Secondary | ICD-10-CM

## 2017-09-22 DIAGNOSIS — E119 Type 2 diabetes mellitus without complications: Secondary | ICD-10-CM | POA: Diagnosis not present

## 2017-09-22 DIAGNOSIS — D649 Anemia, unspecified: Secondary | ICD-10-CM

## 2017-09-22 DIAGNOSIS — N183 Chronic kidney disease, stage 3 unspecified: Secondary | ICD-10-CM

## 2017-09-22 DIAGNOSIS — R5383 Other fatigue: Secondary | ICD-10-CM

## 2017-09-22 DIAGNOSIS — D500 Iron deficiency anemia secondary to blood loss (chronic): Secondary | ICD-10-CM | POA: Diagnosis not present

## 2017-09-22 DIAGNOSIS — Z794 Long term (current) use of insulin: Secondary | ICD-10-CM

## 2017-09-22 DIAGNOSIS — Z79899 Other long term (current) drug therapy: Secondary | ICD-10-CM

## 2017-09-22 LAB — IRON AND TIBC
IRON: 60 ug/dL (ref 28–170)
SATURATION RATIOS: 18 % (ref 10.4–31.8)
TIBC: 337 ug/dL (ref 250–450)
UIBC: 277 ug/dL

## 2017-09-22 LAB — COMPREHENSIVE METABOLIC PANEL
ALT: 17 U/L (ref 0–44)
AST: 20 U/L (ref 15–41)
Albumin: 3.3 g/dL — ABNORMAL LOW (ref 3.5–5.0)
Alkaline Phosphatase: 60 U/L (ref 38–126)
Anion gap: 9 (ref 5–15)
BUN: 44 mg/dL — ABNORMAL HIGH (ref 8–23)
CALCIUM: 9.6 mg/dL (ref 8.9–10.3)
CHLORIDE: 108 mmol/L (ref 98–111)
CO2: 19 mmol/L — AB (ref 22–32)
CREATININE: 2.06 mg/dL — AB (ref 0.44–1.00)
GFR calc non Af Amer: 22 mL/min — ABNORMAL LOW (ref >60)
GFR, EST AFRICAN AMERICAN: 25 mL/min — AB (ref >60)
GLUCOSE: 142 mg/dL — AB (ref 70–99)
Potassium: 4.6 mmol/L (ref 3.5–5.1)
Sodium: 136 mmol/L (ref 135–145)
Total Bilirubin: 0.7 mg/dL (ref 0.3–1.2)
Total Protein: 6.4 g/dL — ABNORMAL LOW (ref 6.5–8.1)

## 2017-09-22 LAB — FERRITIN: Ferritin: 135 ng/mL (ref 11–307)

## 2017-09-22 LAB — ABO/RH: ABO/RH(D): A POS

## 2017-09-22 LAB — HEMATOCRIT: HEMATOCRIT: 20 % — AB (ref 35.0–47.0)

## 2017-09-22 LAB — HEMOGLOBIN: HEMOGLOBIN: 6.5 g/dL — AB (ref 12.0–16.0)

## 2017-09-22 LAB — PREPARE RBC (CROSSMATCH)

## 2017-09-22 LAB — LACTATE DEHYDROGENASE: LDH: 173 U/L (ref 98–192)

## 2017-09-22 MED ORDER — DIPHENHYDRAMINE HCL 25 MG PO CAPS
25.0000 mg | ORAL_CAPSULE | Freq: Once | ORAL | Status: AC
  Administered 2017-09-22: 25 mg via ORAL
  Filled 2017-09-22: qty 1

## 2017-09-22 MED ORDER — SODIUM CHLORIDE 0.9% FLUSH
3.0000 mL | INTRAVENOUS | Status: DC | PRN
  Filled 2017-09-22: qty 3

## 2017-09-22 MED ORDER — ACETAMINOPHEN 325 MG PO TABS
650.0000 mg | ORAL_TABLET | Freq: Once | ORAL | Status: AC
  Administered 2017-09-22: 650 mg via ORAL
  Filled 2017-09-22: qty 2

## 2017-09-22 MED ORDER — SODIUM CHLORIDE 0.9 % IV SOLN
250.0000 mL | Freq: Once | INTRAVENOUS | Status: AC
  Administered 2017-09-22: 250 mL via INTRAVENOUS
  Filled 2017-09-22: qty 250

## 2017-09-22 NOTE — Assessment & Plan Note (Addendum)
#   IDA/ CKD-hemoglobin today is 6.5; worsening; patient symptomatic with worsening fatigue.  Plan PRBC blood transfusion tomorrow.   #The etiology of worsening anemia is unclear-hold p.o. Iron; check stool cards; LDH.  Recheck iron studies.  Increase the dose of Aranesp from 228mcg to 300 mcg every 4 weeks.  If needed plan to do every 2 weeks.  # IV iron- in 1 week; H&H; hold tube; possible PRBC transfusion.   # follow up with 2 weeks/H&H; possible blood transfusion.  Addendum iron studies shows saturation 18%; ferritin 135.LDH-Normal.  CC; Dr.Lateef/ PCP.

## 2017-09-22 NOTE — Progress Notes (Signed)
[facility] OFFICE PROGRESS NOTE  Patient Care Team: Gauger, Victoriano Lain, NP as PCP - General (Internal Medicine)  Cancer Staging No matching staging information was found for the patient.  # IRON DEF ANEMIA/ CKD [Dr.Lateef; Dr.Gittin] sep 2017- Stool/UA/Myeloma work up-NEG;colo- 2010 [per pt] Sep 2017- IV venofer; June 2018-  aranesp  # CKD-III [1.7]; Dr.Lateef   No history exists.     INTERVAL HISTORY:  Sara Bright 77 y.o.  female pleasant patient above history of Chronic anemia/chronic kidney disease; is here for follow-up.    Patient complains of mild to moderate fatigue.  Complains of mild shortness of breath on exertion.  Patient continues to be p.o. iron.  Denies any significant nausea vomiting.  Denies any abdominal discomfort.  She denies any significant weight gain or swelling in the legs.   REVIEW OF SYSTEMS:  A complete 10 point review of system is done which is negative except mentioned above/history of present illness.   PAST MEDICAL HISTORY :  Past Medical History:  Diagnosis Date  . Adenoma of colon   . Adenomatous colon polyp   . Anemia   . Cataract   . Cervical stenosis of spinal canal   . Chronic kidney disease   . Diabetes mellitus without complication ([place])   . GERD (gastroesophageal reflux disease)   . Glaucoma   . Heme positive stool   . History of UTI   . Hx of gallstones   . Hyperkalemia   . Hyperlipidemia   . Hyperlipidemia   . Hypertension   . Lumbar stenosis   . Neuropathy associated with endocrine disorder ([place])   . Obesity   . OSA on CPAP   . Osteoarthritis   . Osteoarthritis   . Retinopathy due to secondary diabetes ([place])   . Secondary hyperparathyroidism of renal origin [facility])     PAST SURGICAL HISTORY :   Past Surgical History:  Procedure Laterality Date  . ACDF X2    . BACK SURGERY    . Cataract extraction right    . catarect extraction left    . CHOLECYSTECTOMY    . COLONOSCOPY WITH PROPOFOL N/A  12/02/2016   Procedure: COLONOSCOPY WITH PROPOFOL;  Surgeon: Manya Silvas, MD;  Location: [facility] ENDOSCOPY;  Service: Endoscopy;  Laterality: N/A;  . colonoscopy with removal lesions by snare    . Endoscoic carpal tunnel release    . ESOPHAGOGASTRODUODENOSCOPY (EGD) WITH PROPOFOL N/A 12/02/2016   Procedure: ESOPHAGOGASTRODUODENOSCOPY (EGD) WITH PROPOFOL;  Surgeon: Manya Silvas, MD;  Location: [facility] ENDOSCOPY;  Service: Endoscopy;  Laterality: N/A;  . Laminectomy posterior lumbar facetectomy and formaninotomy w/Decomp    . Laminectony posterior cervicle decomp w/Facectomy and foraminotomy    . VEIN LIGATION AND STRIPPING Left     FAMILY HISTORY :   Family History  Problem Relation Age of Onset  . Coronary artery disease Mother   . Diabetes type II Mother   . Hypertension Mother   . Tuberculosis Father   . Stroke Father   . Diabetes type II Sister   . Migraines Sister   . Alcohol abuse Brother   . Coronary artery disease Brother   . Diabetes type II Brother   . Kidney cancer Brother   . Kidney disease Brother   . Heart attack Brother   . Hypertension Brother     SOCIAL HISTORY:   Social History   Tobacco Use  . Smoking status: Never Smoker  . Smokeless tobacco: Never Used  Substance  Use Topics  . Alcohol use: No  . Drug use: No    ALLERGIES:  is allergic to hydrocodone-acetaminophen and statins.  MEDICATIONS:  Current Outpatient Medications  Medication Sig Dispense Refill  . acetaminophen (TYLENOL) 500 MG tablet Take 500 mg by mouth every 6 (six) hours as needed.     Marland Kitchen amLODipine (NORVASC) 5 MG tablet Take 5 mg by mouth daily.     Marland Kitchen aspirin EC 81 MG tablet Take 81 mg by mouth daily.    . ASSURE COMFORT LANCETS 30G MISC     . calcitRIOL (ROCALTROL) 0.25 MCG capsule Take 0.25 mcg by mouth 3 (three) times a week.     . carvedilol (COREG) 6.25 MG tablet Take 6.25 mg by mouth 2 (two) times daily.  12  . ferrous sulfate 325 (65 FE) MG tablet Take 325 mg by mouth  daily.    . furosemide (LASIX) 40 MG tablet Take 40 mg by mouth daily.     Marland Kitchen gabapentin (NEURONTIN) 100 MG capsule Take 100 mg by mouth 2 (two) times daily.     Marland Kitchen gemfibrozil (LOPID) 600 MG tablet Take 600 mg by mouth daily.     Marland Kitchen glimepiride (AMARYL) 1 MG tablet Take 1 mg by mouth daily with breakfast.     . Lancet Devices (ADJUSTABLE LANCING DEVICE) MISC     . latanoprost (XALATAN) 0.005 % ophthalmic solution Place 1 drop into both eyes.     Marland Kitchen losartan (COZAAR) 50 MG tablet Take 100 mg by mouth daily.     . Multiple Vitamins-Minerals (CENTRUM SILVER) tablet Take 1 tablet by mouth daily.     . Omega-3 Fatty Acids (FISH OIL) 1000 MG CAPS Take 1,000 mg by mouth daily.     Marland Kitchen omeprazole (PRILOSEC) 20 MG capsule Take 20 mg by mouth daily.     . ONE TOUCH ULTRA TEST test strip     . pioglitazone (ACTOS) 30 MG tablet Take 30 mg by mouth daily.     . timolol (TIMOPTIC-XR) 0.5 % ophthalmic gel-forming Place 1 drop into both eyes.      No current facility-administered medications for this visit.     PHYSICAL EXAMINATION:  BP 115/70 (BP Location: Left Arm, Patient Position: Sitting)   Pulse 78   Temp 97.9 F (36.6 C) (Tympanic)   Resp (!) 24   Ht 5\' 4"  (1.626 m)   Wt 255 lb (115.7 kg)   SpO2 97%   BMI 43.77 kg/m   Filed Weights   09/22/17 0928  Weight: 255 lb (115.7 kg)    GENERAL: Well-nourished well-developed; Alert, no distress and comfortable. Alone.  EYES: no pallor or icterus OROPHARYNX: no thrush or ulceration; good dentition  NECK: supple, no masses felt LYMPH:  no palpable lymphadenopathy in the cervical, axillary or inguinal regions LUNGS: clear to auscultation and  No wheeze or crackles HEART/CVS: regular rate & rhythm and no murmurs; No lower extremity edema ABDOMEN:abdomen soft, non-tender and normal bowel sounds Musculoskeletal:no cyanosis of digits and no clubbing  PSYCH: alert & oriented x 3 with fluent speech NEURO: no focal motor/sensory deficits SKIN:  no  rashes or significant lesions  LABORATORY DATA:  I have reviewed the data as listed    Component Value Date/Time   NA 136 09/22/2017 1031   NA 137 10/23/2012 2335   K 4.6 09/22/2017 1031   K 4.4 10/23/2012 2335   CL 108 09/22/2017 1031   CL 107 10/23/2012 2335   CO2 19 (L) 09/22/2017  1031   CO2 26 10/23/2012 2335   GLUCOSE 142 (H) 09/22/2017 1031   GLUCOSE 179 (H) 10/23/2012 2335   BUN 44 (H) 09/22/2017 1031   BUN 21 (H) 10/23/2012 2335   CREATININE 2.06 (H) 09/22/2017 1031   CREATININE 1.23 10/23/2012 2335   CALCIUM 9.6 09/22/2017 1031   CALCIUM 10.3 (H) 10/23/2012 2335   PROT 6.4 (L) 09/22/2017 1031   PROT 7.4 06/16/2011 1144   ALBUMIN 3.3 (L) 09/22/2017 1031   ALBUMIN 3.5 06/16/2011 1144   AST 20 09/22/2017 1031   AST 22 08/18/2011 1026   ALT 17 09/22/2017 1031   ALT 63 06/16/2011 1144   ALKPHOS 60 09/22/2017 1031   ALKPHOS 98 06/16/2011 1144   BILITOT 0.7 09/22/2017 1031   BILITOT 0.4 06/16/2011 1144   GFRNONAA 22 (L) 09/22/2017 1031   GFRNONAA 43 (L) 10/23/2012 2335   GFRAA 25 (L) 09/22/2017 1031   GFRAA 50 (L) 10/23/2012 2335    No results found for: SPEP, UPEP  Lab Results  Component Value Date   WBC 4.8 06/22/2017   NEUTROABS 2.7 06/22/2017   HGB 6.5 (L) 09/22/2017   HCT 20.0 (L) 09/22/2017   MCV 97.5 06/22/2017   PLT 160 06/22/2017      Chemistry      Component Value Date/Time   NA 136 09/22/2017 1031   NA 137 10/23/2012 2335   K 4.6 09/22/2017 1031   K 4.4 10/23/2012 2335   CL 108 09/22/2017 1031   CL 107 10/23/2012 2335   CO2 19 (L) 09/22/2017 1031   CO2 26 10/23/2012 2335   BUN 44 (H) 09/22/2017 1031   BUN 21 (H) 10/23/2012 2335   CREATININE 2.06 (H) 09/22/2017 1031   CREATININE 1.23 10/23/2012 2335      Component Value Date/Time   CALCIUM 9.6 09/22/2017 1031   CALCIUM 10.3 (H) 10/23/2012 2335   ALKPHOS 60 09/22/2017 1031   ALKPHOS 98 06/16/2011 1144   AST 20 09/22/2017 1031   AST 22 08/18/2011 1026   ALT 17 09/22/2017 1031   ALT  63 06/16/2011 1144   BILITOT 0.7 09/22/2017 1031   BILITOT 0.4 06/16/2011 1144     Results for Sara Bright, Sara Bright (MRN [id number]) as of 02/18/2017 10:08  Ref. Range 05/12/2016 08:53 09/01/2016 13:52 11/19/2016 08:42 12/22/2016 10:29 01/19/2017 10:20  Iron Latest Ref Range: 28 - 170 ug/dL 75 67 62 79 54  UIBC Latest Units: ug/dL 259 266 280 236 250  TIBC Latest Ref Range: 250 - 450 ug/dL 334 333 342 315 304  Saturation Ratios Latest Ref Range: 10.4 - 31.8 % 23 20 18 25 18   Ferritin Latest Ref Range: 11 - 307 ng/mL 651 (H) 467 (H) 413 (H) 633 (H) 374 (H)    RADIOGRAPHIC STUDIES: I have personally reviewed the radiological images as listed and agreed with the findings in the report. No results found.   ASSESSMENT & PLAN:  Anemia due to stage 3 chronic kidney disease # IDA/ CKD-hemoglobin today is 6.5; worsening; patient symptomatic with worsening fatigue.  Plan PRBC blood transfusion tomorrow.   #The etiology of worsening anemia is unclear-hold p.o. Iron; check stool cards; LDH.  Recheck iron studies.  Increase the dose of Aranesp from 243mcg to 300 mcg every 4 weeks.  If needed plan to do every 2 weeks.  # IV iron- in 1 week; H&H; hold tube; possible PRBC transfusion.   # follow up with 2 weeks/H&H; possible blood transfusion.  Addendum iron studies shows saturation  18%; ferritin 135.LDH-Normal.  CC; Dr.Lateef/ PCP.   Orders Placed This Encounter  Procedures  . Lactate dehydrogenase    Standing Status:   Future    Number of Occurrences:   1    Standing Expiration Date:   09/22/2018  . Occult blood card to lab, stool    Standing Status:   Future    Standing Expiration Date:   09/23/2018  . Occult blood card to lab, stool    Standing Status:   Future    Standing Expiration Date:   09/23/2018  . Type and screen    Standing Status:   Future    Number of Occurrences:   1    Standing Expiration Date:   09/22/2018   All questions were answered. The patient knows to call the clinic with  any problems, questions or concerns.      Cammie Sickle, MD 09/22/2017 9:41 PM

## 2017-09-23 LAB — BPAM RBC
Blood Product Expiration Date: 201907212359
ISSUE DATE / TIME: 201907171236
UNIT TYPE AND RH: 600

## 2017-09-23 LAB — TYPE AND SCREEN
ABO/RH(D): A POS
ANTIBODY SCREEN: NEGATIVE
UNIT DIVISION: 0

## 2017-09-26 DIAGNOSIS — N183 Chronic kidney disease, stage 3 (moderate): Secondary | ICD-10-CM | POA: Diagnosis not present

## 2017-09-28 DIAGNOSIS — N183 Chronic kidney disease, stage 3 (moderate): Secondary | ICD-10-CM | POA: Diagnosis not present

## 2017-09-29 ENCOUNTER — Other Ambulatory Visit: Payer: Self-pay

## 2017-09-29 ENCOUNTER — Inpatient Hospital Stay: Payer: Medicare HMO

## 2017-09-29 ENCOUNTER — Encounter: Payer: Self-pay | Admitting: *Deleted

## 2017-09-29 ENCOUNTER — Other Ambulatory Visit: Payer: Self-pay | Admitting: *Deleted

## 2017-09-29 DIAGNOSIS — N183 Chronic kidney disease, stage 3 unspecified: Secondary | ICD-10-CM

## 2017-09-29 DIAGNOSIS — D631 Anemia in chronic kidney disease: Secondary | ICD-10-CM

## 2017-09-29 DIAGNOSIS — I878 Other specified disorders of veins: Secondary | ICD-10-CM

## 2017-09-29 LAB — OCCULT BLOOD X 1 CARD TO LAB, STOOL
FECAL OCCULT BLD: POSITIVE — AB
Fecal Occult Bld: POSITIVE — AB

## 2017-09-29 NOTE — Progress Notes (Unsigned)
Patient here for venofer possible blood. Unable to obtain IV access or draw labs due to poor periperal access. Message sent to Renita Papa, patient is agreeable to port-a-cath placement

## 2017-09-29 NOTE — Progress Notes (Signed)
1100 am-Patient scheduled for lab/iron infusion today. Multiple unsuccessful IV attempts today for lab draw and IV infusion. Poor venous access per infusion nurses and lab. Spoke with Dr. Rogue Bussing. V/o to set up port a cath as soon as possible. Patient's last hgb 1 week ago was 6.5. Due to poor venous access, unable to perform successful venipuncture in lab today. Daughter and pt's sister and patient asked to speak to Nira Conn, RN to discuss pt's care. Daughter expressed frustration at "not knowing her mom's hgb and being sent home without having any labs drawn today." pt did not want any further lab sticks today. active listening provided to patient and pt's family. Discussed with pt and pt's family that multiple attempts have been made today. Dr. B would like to have port placed as soon as possible so that labs and IV infusions can be performed via port due to poor venous access. I have made the referral to IR and also placed a phone call to specialty scheduling. I am waiting on the call back to determine when this procedure can be performed. Pt and pt's family gave verbal understanding of the plan of care. They discussed that they were "discouraged that this could not performed today. We are worried that since she is dizzy that she may need blood as soon as possible." family asked if the port could be placed today. I reiterated that the port could not be placed today. This would need to be a scheduled procedure. I am advocating to have the port placed as soon as possible. Pt and pt's family gave verbal understanding and will wait on my return phone call.   I received a phone call at 1227 from [facility] in specialty scheduling.  Port can be placed on Friday at 830 am. Pt would need to arrive at 730 am. Pt will need to be NPO 6-8 hrs prior to procedure. I explained to Pamala Hurry to leave a msg for Juanita to contact me in the cancer center to discuss pt's poor venous access. Labs have not been drawn since last week.  She will have [place] contact me in the clinic today after 1:30pm.    I contacted the patient's daughter, Baldo Ash, with the above apt information ([phone]). Daughter gave v/o understanding of the plan of care. She requested that my chart password be reset at [phone].  This was sent per daughter's request. Pt has previously lost the written password for mychart.  I explained to Baldo Ash that our office would schedule IV iron after pt's port placement on Friday. I would communicate to the RN to have pt's IV port access to remain at d/c from IR dept if possible. This way the patient could avoid further delays in receiving her iron infusion.  Daughter thanked me for advocating for her mother.

## 2017-09-30 ENCOUNTER — Other Ambulatory Visit: Payer: Self-pay | Admitting: Radiology

## 2017-10-01 ENCOUNTER — Inpatient Hospital Stay: Payer: Medicare HMO

## 2017-10-01 ENCOUNTER — Ambulatory Visit
Admission: RE | Admit: 2017-10-01 | Discharge: 2017-10-01 | Disposition: A | Discharge status: Home / Self Care | Payer: Medicare HMO | Source: Ambulatory Visit | Attending: Internal Medicine | Admitting: Internal Medicine

## 2017-10-01 ENCOUNTER — Other Ambulatory Visit: Payer: Self-pay | Admitting: *Deleted

## 2017-10-01 ENCOUNTER — Telehealth: Payer: Self-pay | Admitting: Internal Medicine

## 2017-10-01 ENCOUNTER — Telehealth: Payer: Self-pay | Admitting: *Deleted

## 2017-10-01 VITALS — BP 115/72 | HR 67 | Temp 96.8°F | Resp 18

## 2017-10-01 DIAGNOSIS — D508 Other iron deficiency anemias: Secondary | ICD-10-CM

## 2017-10-01 DIAGNOSIS — K219 Gastro-esophageal reflux disease without esophagitis: Secondary | ICD-10-CM | POA: Diagnosis not present

## 2017-10-01 DIAGNOSIS — E669 Obesity, unspecified: Secondary | ICD-10-CM | POA: Insufficient documentation

## 2017-10-01 DIAGNOSIS — N183 Chronic kidney disease, stage 3 unspecified: Secondary | ICD-10-CM

## 2017-10-01 DIAGNOSIS — N2581 Secondary hyperparathyroidism of renal origin: Secondary | ICD-10-CM | POA: Diagnosis not present

## 2017-10-01 DIAGNOSIS — Z9049 Acquired absence of other specified parts of digestive tract: Secondary | ICD-10-CM | POA: Insufficient documentation

## 2017-10-01 DIAGNOSIS — E1122 Type 2 diabetes mellitus with diabetic chronic kidney disease: Secondary | ICD-10-CM | POA: Diagnosis not present

## 2017-10-01 DIAGNOSIS — Z833 Family history of diabetes mellitus: Secondary | ICD-10-CM | POA: Insufficient documentation

## 2017-10-01 DIAGNOSIS — Z4901 Encounter for fitting and adjustment of extracorporeal dialysis catheter: Secondary | ICD-10-CM | POA: Diagnosis not present

## 2017-10-01 DIAGNOSIS — M1990 Unspecified osteoarthritis, unspecified site: Secondary | ICD-10-CM | POA: Insufficient documentation

## 2017-10-01 DIAGNOSIS — G4733 Obstructive sleep apnea (adult) (pediatric): Secondary | ICD-10-CM | POA: Diagnosis not present

## 2017-10-01 DIAGNOSIS — E785 Hyperlipidemia, unspecified: Secondary | ICD-10-CM | POA: Insufficient documentation

## 2017-10-01 DIAGNOSIS — Z9841 Cataract extraction status, right eye: Secondary | ICD-10-CM | POA: Insufficient documentation

## 2017-10-01 DIAGNOSIS — N189 Chronic kidney disease, unspecified: Secondary | ICD-10-CM | POA: Diagnosis not present

## 2017-10-01 DIAGNOSIS — Z888 Allergy status to other drugs, medicaments and biological substances status: Secondary | ICD-10-CM | POA: Insufficient documentation

## 2017-10-01 DIAGNOSIS — Z8249 Family history of ischemic heart disease and other diseases of the circulatory system: Secondary | ICD-10-CM | POA: Insufficient documentation

## 2017-10-01 DIAGNOSIS — H269 Unspecified cataract: Secondary | ICD-10-CM | POA: Diagnosis not present

## 2017-10-01 DIAGNOSIS — Z87440 Personal history of urinary (tract) infections: Secondary | ICD-10-CM | POA: Insufficient documentation

## 2017-10-01 DIAGNOSIS — D631 Anemia in chronic kidney disease: Secondary | ICD-10-CM

## 2017-10-01 DIAGNOSIS — I878 Other specified disorders of veins: Secondary | ICD-10-CM | POA: Insufficient documentation

## 2017-10-01 DIAGNOSIS — E11319 Type 2 diabetes mellitus with unspecified diabetic retinopathy without macular edema: Secondary | ICD-10-CM | POA: Diagnosis not present

## 2017-10-01 DIAGNOSIS — Z9889 Other specified postprocedural states: Secondary | ICD-10-CM | POA: Insufficient documentation

## 2017-10-01 DIAGNOSIS — E875 Hyperkalemia: Secondary | ICD-10-CM | POA: Insufficient documentation

## 2017-10-01 DIAGNOSIS — Z86010 Personal history of colonic polyps: Secondary | ICD-10-CM | POA: Insufficient documentation

## 2017-10-01 DIAGNOSIS — Z823 Family history of stroke: Secondary | ICD-10-CM | POA: Insufficient documentation

## 2017-10-01 DIAGNOSIS — Z7984 Long term (current) use of oral hypoglycemic drugs: Secondary | ICD-10-CM | POA: Insufficient documentation

## 2017-10-01 DIAGNOSIS — Z885 Allergy status to narcotic agent status: Secondary | ICD-10-CM | POA: Diagnosis not present

## 2017-10-01 DIAGNOSIS — Z9842 Cataract extraction status, left eye: Secondary | ICD-10-CM | POA: Insufficient documentation

## 2017-10-01 DIAGNOSIS — Z7982 Long term (current) use of aspirin: Secondary | ICD-10-CM | POA: Diagnosis not present

## 2017-10-01 DIAGNOSIS — M48061 Spinal stenosis, lumbar region without neurogenic claudication: Secondary | ICD-10-CM | POA: Insufficient documentation

## 2017-10-01 DIAGNOSIS — I129 Hypertensive chronic kidney disease with stage 1 through stage 4 chronic kidney disease, or unspecified chronic kidney disease: Secondary | ICD-10-CM | POA: Insufficient documentation

## 2017-10-01 DIAGNOSIS — E1140 Type 2 diabetes mellitus with diabetic neuropathy, unspecified: Secondary | ICD-10-CM | POA: Diagnosis not present

## 2017-10-01 DIAGNOSIS — Z79899 Other long term (current) drug therapy: Secondary | ICD-10-CM | POA: Insufficient documentation

## 2017-10-01 DIAGNOSIS — Z841 Family history of disorders of kidney and ureter: Secondary | ICD-10-CM | POA: Insufficient documentation

## 2017-10-01 DIAGNOSIS — H409 Unspecified glaucoma: Secondary | ICD-10-CM | POA: Diagnosis not present

## 2017-10-01 HISTORY — PX: IR IMAGING GUIDED PORT INSERTION: IMG5740

## 2017-10-01 LAB — CBC
HEMATOCRIT: 24.8 % — AB (ref 35.0–47.0)
Hemoglobin: 8.1 g/dL — ABNORMAL LOW (ref 12.0–16.0)
MCH: 33.5 pg (ref 26.0–34.0)
MCHC: 32.4 g/dL (ref 32.0–36.0)
MCV: 103.4 fL — ABNORMAL HIGH (ref 80.0–100.0)
PLATELETS: 224 10*3/uL (ref 150–440)
RBC: 2.4 MIL/uL — ABNORMAL LOW (ref 3.80–5.20)
RDW: 16.9 % — AB (ref 11.5–14.5)
WBC: 4 10*3/uL (ref 3.6–11.0)

## 2017-10-01 LAB — PROTIME-INR
INR: 0.97
Prothrombin Time: 12.8 seconds (ref 11.4–15.2)

## 2017-10-01 LAB — BASIC METABOLIC PANEL
Anion gap: 6 (ref 5–15)
BUN: 58 mg/dL — AB (ref 8–23)
CALCIUM: 9.7 mg/dL (ref 8.9–10.3)
CO2: 25 mmol/L (ref 22–32)
CREATININE: 2.01 mg/dL — AB (ref 0.44–1.00)
Chloride: 110 mmol/L (ref 98–111)
GFR calc Af Amer: 26 mL/min — ABNORMAL LOW (ref >60)
GFR, EST NON AFRICAN AMERICAN: 23 mL/min — AB (ref >60)
GLUCOSE: 93 mg/dL (ref 70–99)
Potassium: 4.8 mmol/L (ref 3.5–5.1)
SODIUM: 141 mmol/L (ref 135–145)

## 2017-10-01 MED ORDER — SODIUM CHLORIDE 0.9 % IV SOLN
INTRAVENOUS | Status: DC
  Administered 2017-10-01: 09:00:00 via INTRAVENOUS

## 2017-10-01 MED ORDER — FENTANYL CITRATE (PF) 100 MCG/2ML IJ SOLN
INTRAMUSCULAR | Status: AC | PRN
  Administered 2017-10-01: 25 ug via INTRAVENOUS
  Administered 2017-10-01: 50 ug via INTRAVENOUS

## 2017-10-01 MED ORDER — LIDOCAINE-EPINEPHRINE (PF) 1 %-1:200000 IJ SOLN
INTRAMUSCULAR | Status: AC | PRN
  Administered 2017-10-01: 15 mL

## 2017-10-01 MED ORDER — LIDOCAINE-EPINEPHRINE (PF) 1 %-1:200000 IJ SOLN
20.0000 mL | Freq: Once | INTRAMUSCULAR | Status: DC
  Filled 2017-10-01: qty 20

## 2017-10-01 MED ORDER — SODIUM CHLORIDE 0.9 % IV SOLN
Freq: Once | INTRAVENOUS | Status: AC
  Administered 2017-10-01: 12:00:00 via INTRAVENOUS
  Filled 2017-10-01: qty 1000

## 2017-10-01 MED ORDER — CEFAZOLIN SODIUM-DEXTROSE 2-4 GM/100ML-% IV SOLN: 2.0000 g | INTRAVENOUS | Status: DC

## 2017-10-01 MED ORDER — LIDOCAINE-EPINEPHRINE (PF) 1 %-1:200000 IJ SOLN
INTRAMUSCULAR | Status: AC
  Filled 2017-10-01: qty 30

## 2017-10-01 MED ORDER — IRON SUCROSE 20 MG/ML IV SOLN
200.0000 mg | Freq: Once | INTRAVENOUS | Status: AC
  Administered 2017-10-01: 200 mg via INTRAVENOUS
  Filled 2017-10-01: qty 10

## 2017-10-01 MED ORDER — CEFAZOLIN SODIUM-DEXTROSE 2-4 GM/100ML-% IV SOLN
INTRAVENOUS | Status: AC
  Administered 2017-10-01: 2000 mg
  Filled 2017-10-01: qty 100

## 2017-10-01 MED ORDER — HEPARIN SOD (PORK) LOCK FLUSH 100 UNIT/ML IV SOLN
INTRAVENOUS | Status: AC
  Filled 2017-10-01: qty 5

## 2017-10-01 MED ORDER — FENTANYL CITRATE (PF) 100 MCG/2ML IJ SOLN
INTRAMUSCULAR | Status: AC
  Filled 2017-10-01: qty 4

## 2017-10-01 MED ORDER — SODIUM CHLORIDE 0.9% FLUSH
10.0000 mL | INTRAVENOUS | Status: DC | PRN
  Administered 2017-10-01: 10 mL
  Filled 2017-10-01: qty 10

## 2017-10-01 MED ORDER — HEPARIN SOD (PORK) LOCK FLUSH 100 UNIT/ML IV SOLN
INTRAVENOUS | Status: AC
Start: 2017-10-01 — End: ?
  Filled 2017-10-01: qty 5

## 2017-10-01 MED ORDER — HEPARIN SOD (PORK) LOCK FLUSH 100 UNIT/ML IV SOLN
500.0000 [IU] | Freq: Once | INTRAVENOUS | Status: AC | PRN
  Administered 2017-10-01: 500 [IU]

## 2017-10-01 MED ORDER — HEPARIN (PORCINE) IN NACL 1000-0.9 UT/500ML-% IV SOLN
INTRAVENOUS | Status: AC
  Filled 2017-10-01: qty 500

## 2017-10-01 MED ORDER — MIDAZOLAM HCL 5 MG/5ML IJ SOLN
INTRAMUSCULAR | Status: AC
  Filled 2017-10-01: qty 5

## 2017-10-01 MED ORDER — MIDAZOLAM HCL 5 MG/5ML IJ SOLN
INTRAMUSCULAR | Status: AC | PRN
  Administered 2017-10-01: 1 mg via INTRAVENOUS
  Administered 2017-10-01: 0.5 mg via INTRAVENOUS

## 2017-10-01 NOTE — Procedures (Signed)
Placement of right IJ port.  Tip at SVC/RA junction.  Minimal blood loss and no immediate complication.   

## 2017-10-01 NOTE — Progress Notes (Signed)
Patient remains clinically stable post port placement with Dr Anselm Pancoast. Vitals stable, daughter at bedside. Denies complaints. Discharge instructions given with questions answered.

## 2017-10-01 NOTE — Discharge Instructions (Signed)
Tunneled Catheter Insertion, Care After °Refer to this sheet in the next few weeks. These instructions provide you with information about caring for yourself after your procedure. Your health care provider may also give you more specific instructions. Your treatment has been planned according to current medical practices, but problems sometimes occur. Call your health care provider if you have any problems or questions after your procedure. °What can I expect after the procedure? °After the procedure, it is common to have: °· Some mild redness, swelling, and pain around your catheter site. °· A small amount of blood or clear fluid coming from your incisions. ° °Follow these instructions at home: °Incision care °· Check your incision areas every day for signs of infection. Check for: °? More redness, swelling, or pain. °? More fluid or blood. °? Warmth. °? Pus or a bad smell. °· Follow instructions from your health care provider about how to take care of your incisions. Make sure you: °? Wash your hands with soap and water before you change your bandages (dressings). If soap and water are not available, use hand sanitizer. °? Change your dressings as told by your health care provider. Wash the area around your incisions with a germ-killing (antiseptic) solution when you change your dressing, as told by your health care provider. °? Leave stitches (sutures), skin glue, or adhesive strips in place. These skin closures may need to stay in place for 2 weeks or longer. If adhesive strip edges start to loosen and curl up, you may trim the loose edges. Do not remove adhesive strips completely unless your health care provider tells you to do that. °Catheter Care ° °· Wash your hands with soap and water before and after caring for your catheter. If soap and water are not available, use hand sanitizer. °· Keep your catheter site and your dressings clean and dry. °· Apply an antibiotic ointment to your catheter site as told by  your health care provider. °· Flush your catheter as told by your health care provider. This helps prevent it from becoming clogged. °· Do not open the caps on the ends of the catheter. °· Do not pull on your catheter. °· If your catheter is in your arm: °? Avoid wearing tight clothes or tight jewelry on your arm that has the catheter. °? Do not sleep with your head on the arm that has the catheter. °? Do not allow your blood pressure to be taken on the arm that has the catheter. °? Do not allow your blood to be drawn from the arm that has the catheter, except through the catheter itself. °Medicines °· Take over-the-counter and prescription medicines only as told by your health care provider. °· If you were prescribed an antibiotic medicine, take it as told by your health care provider. Do not stop taking the antibiotic even if you start to feel better. °Activity °· Return to your normal activities as told by your health care provider. Ask your health care provider what activities are safe for you. °· Do not lift anything that is heavier than 10 lb (4.5 kg) for 3 weeks or as long as told by your health care provider. °Driving °· Do not drive until your health care provider approves. °· Do not drive or operate heavy machinery while taking prescription pain medicine. °General instructions °· Follow your health care provider's specific instructions for the type of catheter that you have. °· Do not take baths, swim, or use a hot tub until your health   care provider approves. °· Follow instructions from your health care provider about eating or drinking restrictions. °· Wear compression stockings as told by your health care provider. These stockings help to prevent blood clots and reduce swelling in your legs. °· Keep all follow-up visits as told by your health care provider. This is important. °Contact a health care provider if: °· You have more fluid or blood coming from your incisions. °· You have more redness,  swelling, or pain at your incisions or around the area where your catheter is inserted. °· Your incisions feel warm to the touch. °· You feel unusually weak. °· You feel nauseous. °· Your catheter is not working properly. °· You have blood or fluid draining from your catheter. °· You are unable to flush your catheter. °Get help right away if: °· Your catheter breaks. °· A hole develops in your catheter. °· Your catheter comes loose or gets pulled completely out. If this happens, press on your catheter site firmly with your hand or a clean cloth until you get medical help. °· Your catheter becomes blocked. °· You have swelling in your arm, shoulder, neck, or face. °· You develop chest pain. °· You have difficulty breathing. °· You feel dizzy or light-headed. °· You have pus or a bad smell coming from your incisions. °· You have a fever. °· You develop bleeding from your catheter or your insertion site, and your bleeding does not stop. °This information is not intended to replace advice given to you by your health care provider. Make sure you discuss any questions you have with your health care provider. °Document Released: 02/10/2012 Document Revised: 10/27/2015 Document Reviewed: 11/19/2014 °Elsevier Interactive Patient Education © 2018 [employer] ° °

## 2017-10-01 NOTE — Telephone Encounter (Signed)
FYI-  Spoke to patient's daughter-Charlotte [[phone]; ] regarding Hemoccult positive stools.  Patient had colonoscopy EGD unremarkable September 2018.  However with worsening anemia/heme positive stools recommend reevaluation with Dr. Percell [place] office.  Recommend they call Dr. Tiffany Kocher office; we will also make a referral.  Also, reviewed the appointment with daughter.

## 2017-10-01 NOTE — Telephone Encounter (Signed)
Referral initiated. msg sent to scheduling team.

## 2017-10-01 NOTE — Telephone Encounter (Signed)
Apt with GI made on 7/30 per [facility] in c.ctr. Scheduling. She will contact daughter with apts.

## 2017-10-01 NOTE — Telephone Encounter (Signed)
Per Nira Conn 10/01/17 scheduler message. urgent referral to Dr. Vira Agar  Patient is scheduled for 10/05/17 @ 9:00.  I called her daughter Tawny Hopping) and made her aware of the location, date and time of the scheduled appt.

## 2017-10-01 NOTE — H&P (Signed)
Chief Complaint: Patient was seen in consultation today for portacath placement at the request of Brahmanday,Govinda R  Referring Physician(s): Brahmanday,Govinda R   Patient Status: [facility] - Out-pt  History of Present Illness: Sara Bright is a 77 y.o. female with anemia and chronic kidney disease.  Patient has poor venous access and requiring frequent blood draws and intravenous infusions.  Patient presents for a portacath placement. No complaints today.  She does have chronic shortness of breath that may be related to the anemia.  She has sleep apnea and uses a CPAP.    Past Medical History:  Diagnosis Date  . Adenoma of colon   . Adenomatous colon polyp   . Anemia   . Cataract   . Cervical stenosis of spinal canal   . Chronic kidney disease   . Diabetes mellitus without complication ([place])   . GERD (gastroesophageal reflux disease)   . Glaucoma   . Heme positive stool   . History of UTI   . Hx of gallstones   . Hyperkalemia   . Hyperlipidemia   . Hyperlipidemia   . Hypertension   . Lumbar stenosis   . Neuropathy associated with endocrine disorder ([place])   . Obesity   . OSA on CPAP   . Osteoarthritis   . Osteoarthritis   . Retinopathy due to secondary diabetes ([place])   . Secondary hyperparathyroidism of renal origin [facility])     Past Surgical History:  Procedure Laterality Date  . ACDF X2    . BACK SURGERY    . Cataract extraction right    . catarect extraction left    . CHOLECYSTECTOMY    . COLONOSCOPY WITH PROPOFOL N/A 12/02/2016   Procedure: COLONOSCOPY WITH PROPOFOL;  Surgeon: Manya Silvas, MD;  Location: [facility] ENDOSCOPY;  Service: Endoscopy;  Laterality: N/A;  . colonoscopy with removal lesions by snare    . Endoscoic carpal tunnel release    . ESOPHAGOGASTRODUODENOSCOPY (EGD) WITH PROPOFOL N/A 12/02/2016   Procedure: ESOPHAGOGASTRODUODENOSCOPY (EGD) WITH PROPOFOL;  Surgeon: Manya Silvas, MD;  Location: [facility] ENDOSCOPY;  Service: Endoscopy;   Laterality: N/A;  . Laminectomy posterior lumbar facetectomy and formaninotomy w/Decomp    . Laminectony posterior cervicle decomp w/Facectomy and foraminotomy    . VEIN LIGATION AND STRIPPING Left     Allergies: Hydrocodone-acetaminophen and Statins  Medications: Prior to Admission medications   Medication Sig Start Date End Date Taking? Authorizing Provider  amLODipine (NORVASC) 5 MG tablet Take 5 mg by mouth daily.  06/05/15  Yes [provider]  aspirin EC 81 MG tablet Take 81 mg by mouth daily.   Yes [provider]  calcitRIOL (ROCALTROL) 0.25 MCG capsule Take 0.25 mcg by mouth 3 (three) times a week.  09/24/15  Yes [provider]  carvedilol (COREG) 6.25 MG tablet Take 6.25 mg by mouth 2 (two) times daily. 05/06/16  Yes [provider]  furosemide (LASIX) 40 MG tablet Take 40 mg by mouth daily.  08/20/15  Yes [provider]  gabapentin (NEURONTIN) 100 MG capsule Take 100 mg by mouth 2 (two) times daily.  07/15/15  Yes [provider]  gemfibrozil (LOPID) 600 MG tablet Take 600 mg by mouth daily.  08/09/15  Yes [provider]  glimepiride (AMARYL) 1 MG tablet Take 1 mg by mouth daily with breakfast.  08/20/15  Yes [provider]  latanoprost (XALATAN) 0.005 % ophthalmic solution Place 1 drop into both eyes.  08/09/15  Yes [provider]  losartan (COZAAR) 50 MG tablet Take 100 mg by mouth daily.  09/24/15  Yes [provider]  Multiple Vitamins-Minerals (CENTRUM SILVER) tablet Take 1 tablet by mouth daily.  04/30/10  Yes [provider]  Omega-3 Fatty Acids (FISH OIL) 1000 MG CAPS Take 1,000 mg by mouth daily.  04/30/10  Yes [provider]  omeprazole (PRILOSEC) 20 MG capsule Take 20 mg by mouth daily.  08/19/15  Yes [provider]  pioglitazone (ACTOS) 30 MG tablet Take 30 mg by mouth daily.  05/21/15 06/22/37 Yes [provider]  timolol (TIMOPTIC-XR) 0.5 % ophthalmic  gel-forming Place 1 drop into both eyes.  01/17/14  Yes [provider]  acetaminophen (TYLENOL) 500 MG tablet Take 500 mg by mouth every 6 (six) hours as needed.     [provider]  ASSURE COMFORT LANCETS 30G [place]  07/22/16   [provider]  ferrous sulfate 325 (65 FE) MG tablet Take 325 mg by mouth daily.    [provider]  Lancet Devices (ADJUSTABLE LANCING DEVICE) MISC  07/22/16   [provider]  ONE TOUCH ULTRA TEST test strip  07/22/16   [provider]     Family History  Problem Relation Age of Onset  . Coronary artery disease Mother   . Diabetes type II Mother   . Hypertension Mother   . Tuberculosis Father   . Stroke Father   . Diabetes type II Sister   . Migraines Sister   . Alcohol abuse Brother   . Coronary artery disease Brother   . Diabetes type II Brother   . Kidney cancer Brother   . Kidney disease Brother   . Heart attack Brother   . Hypertension Brother     Social History   Socioeconomic History  . Marital status: Married    Spouse name: Not on file  . Number of children: Not on file  . Years of education: Not on file  . Highest education level: Not on file  Occupational History  . Not on file  Social Needs  . Financial resource strain: Not on file  . Food insecurity:    Worry: Not on file    Inability: Not on file  . Transportation needs:    Medical: Not on file    Non-medical: Not on file  Tobacco Use  . Smoking status: Never Smoker  . Smokeless tobacco: Never Used  Substance and Sexual Activity  . Alcohol use: No  . Drug use: No  . Sexual activity: Not on file  Lifestyle  . Physical activity:    Days per week: Not on file    Minutes per session: Not on file  . Stress: Not on file  Relationships  . Social connections:    Talks on phone: Not on file    Gets together: Not on file    Attends religious service: Not on file    Active member of club or organization: Not on file     Attends meetings of clubs or organizations: Not on file    Relationship status: Not on file  Other Topics Concern  . Not on file  Social History Narrative  . Not on file      Review of Systems  Respiratory: Positive for shortness of breath.   Cardiovascular: Negative for chest pain.  Gastrointestinal: Negative.   Genitourinary: Negative.     Vital Signs: BP (!) 179/81   Pulse 72   Temp 98.3 F (36.8 C) (Oral)  Resp (!) 38   SpO2 100%   Physical Exam  Constitutional: No distress.  HENT:  Mouth/Throat: Oropharynx is clear and moist.  Cardiovascular: Normal rate, regular rhythm and normal heart sounds.  Pulmonary/Chest: Effort normal and breath sounds normal.  Abdominal: Soft. She exhibits no distension. There is no tenderness.    Imaging: No results found.  Labs:  CBC: Recent Labs    01/19/17 1020 02/18/17 0946  06/22/17 1016  08/17/17 0907 09/14/17 0910 09/22/17 0857 10/01/17 0810  WBC 4.9 4.4  --  4.8  --   --   --   --  4.0  HGB 10.9* 10.0*   < > 10.0*   < > 8.7* 6.9* 6.5* 8.1*  HCT 32.9* 30.5*   < > 30.3*   < > 26.4* 21.3* 20.0* 24.8*  PLT 175 137*  --  160  --   --   --   --  224   < > = values in this interval not displayed.    COAGS: Recent Labs    10/01/17 0810  INR 0.97    BMP: Recent Labs    01/19/17 1020 06/22/17 1016 09/22/17 1031 10/01/17 0810  NA 132* 136 136 141  K 4.2 4.7 4.6 4.8  CL 101 105 108 110  CO2 25 23 19* 25  GLUCOSE 163* 178* 142* 93  BUN 42* 51* 44* 58*  CALCIUM 9.4 9.8 9.6 9.7  CREATININE 1.71* 1.47* 2.06* 2.01*  GFRNONAA 27* 33* 22* 23*  GFRAA 32* 38* 25* 26*    LIVER FUNCTION TESTS: Recent Labs    01/19/17 1020 09/22/17 1031  BILITOT 0.8 0.7  AST 28 20  ALT 20 17  ALKPHOS 70 60  PROT 7.1 6.4*  ALBUMIN 3.6 3.3*    TUMOR MARKERS: No results for input(s): AFPTM, CEA, CA199, CHROMGRNA in the last 8760 hours.  Assessment and Plan:  77 yo with anemia and chronic kidney disease with poor venous  access.  She needs long term IV access for frequent blood draws and infusions.  Portacath device was explained to patient and family.  Risks include but not limited to bleeding, infection, vascular injury and DVT.  Informed consent for port placement with moderate sedation.    Thank you for this interesting consult.  I greatly enjoyed meeting Sara Bright and look forward to participating in their care.  A copy of this report was sent to the requesting provider on this date.  Electronically Signed: Burman Riis, MD 10/01/2017, 8:51 AM   I spent a total of  15 Minutes   in face to face in clinical consultation, greater than 50% of which was counseling/coordinating care for portacath placement.

## 2017-10-05 DIAGNOSIS — N183 Chronic kidney disease, stage 3 (moderate): Secondary | ICD-10-CM | POA: Diagnosis not present

## 2017-10-05 DIAGNOSIS — R195 Other fecal abnormalities: Secondary | ICD-10-CM | POA: Diagnosis not present

## 2017-10-05 DIAGNOSIS — D631 Anemia in chronic kidney disease: Secondary | ICD-10-CM | POA: Diagnosis not present

## 2017-10-05 DIAGNOSIS — D509 Iron deficiency anemia, unspecified: Secondary | ICD-10-CM | POA: Diagnosis not present

## 2017-10-06 ENCOUNTER — Other Ambulatory Visit: Payer: Self-pay

## 2017-10-06 ENCOUNTER — Inpatient Hospital Stay: Payer: Medicare HMO

## 2017-10-06 ENCOUNTER — Encounter: Payer: Self-pay | Admitting: Internal Medicine

## 2017-10-06 ENCOUNTER — Inpatient Hospital Stay (HOSPITAL_BASED_OUTPATIENT_CLINIC_OR_DEPARTMENT_OTHER): Payer: Medicare HMO | Admitting: Internal Medicine

## 2017-10-06 VITALS — BP 125/85 | HR 69 | Temp 97.6°F | Resp 24 | Ht 64.0 in | Wt 260.0 lb

## 2017-10-06 DIAGNOSIS — D500 Iron deficiency anemia secondary to blood loss (chronic): Secondary | ICD-10-CM

## 2017-10-06 DIAGNOSIS — D631 Anemia in chronic kidney disease: Secondary | ICD-10-CM | POA: Diagnosis not present

## 2017-10-06 DIAGNOSIS — E119 Type 2 diabetes mellitus without complications: Secondary | ICD-10-CM

## 2017-10-06 DIAGNOSIS — Z79899 Other long term (current) drug therapy: Secondary | ICD-10-CM

## 2017-10-06 DIAGNOSIS — N183 Chronic kidney disease, stage 3 unspecified: Secondary | ICD-10-CM

## 2017-10-06 DIAGNOSIS — Z7982 Long term (current) use of aspirin: Secondary | ICD-10-CM

## 2017-10-06 DIAGNOSIS — Z7984 Long term (current) use of oral hypoglycemic drugs: Secondary | ICD-10-CM | POA: Diagnosis not present

## 2017-10-06 DIAGNOSIS — R5383 Other fatigue: Secondary | ICD-10-CM

## 2017-10-06 DIAGNOSIS — D508 Other iron deficiency anemias: Secondary | ICD-10-CM

## 2017-10-06 LAB — CBC WITH DIFFERENTIAL/PLATELET
BASOS ABS: 0 10*3/uL (ref 0–0.1)
BASOS PCT: 1 %
Eosinophils Absolute: 0.2 10*3/uL (ref 0–0.7)
Eosinophils Relative: 5 %
HCT: 24.6 % — ABNORMAL LOW (ref 35.0–47.0)
Hemoglobin: 8 g/dL — ABNORMAL LOW (ref 12.0–16.0)
Lymphocytes Relative: 26 %
Lymphs Abs: 1 10*3/uL (ref 1.0–3.6)
MCH: 33.4 pg (ref 26.0–34.0)
MCHC: 32.4 g/dL (ref 32.0–36.0)
MCV: 103.3 fL — AB (ref 80.0–100.0)
MONO ABS: 0.5 10*3/uL (ref 0.2–0.9)
Monocytes Relative: 13 %
Neutro Abs: 2.1 10*3/uL (ref 1.4–6.5)
Neutrophils Relative %: 55 %
Platelets: 186 10*3/uL (ref 150–440)
RBC: 2.38 MIL/uL — AB (ref 3.80–5.20)
RDW: 16.1 % — AB (ref 11.5–14.5)
WBC: 3.7 10*3/uL (ref 3.6–11.0)

## 2017-10-06 LAB — IRON AND TIBC
Iron: 43 ug/dL (ref 28–170)
Saturation Ratios: 13 % (ref 10.4–31.8)
TIBC: 333 ug/dL (ref 250–450)
UIBC: 291 ug/dL

## 2017-10-06 LAB — SAMPLE TO BLOOD BANK

## 2017-10-06 LAB — FERRITIN: FERRITIN: 159 ng/mL (ref 11–307)

## 2017-10-06 MED ORDER — HEPARIN SOD (PORK) LOCK FLUSH 100 UNIT/ML IV SOLN
500.0000 [IU] | Freq: Once | INTRAVENOUS | Status: AC
  Administered 2017-10-06: 500 [IU] via INTRAVENOUS
  Filled 2017-10-06: qty 5

## 2017-10-06 MED ORDER — DARBEPOETIN ALFA 300 MCG/0.6ML IJ SOSY
300.0000 ug | PREFILLED_SYRINGE | Freq: Once | INTRAMUSCULAR | Status: AC
  Administered 2017-10-06: 300 ug via SUBCUTANEOUS
  Filled 2017-10-06: qty 0.6

## 2017-10-06 MED ORDER — LIDOCAINE-PRILOCAINE 2.5-2.5 % EX CREA: 1.0000 "application " | TOPICAL_CREAM | CUTANEOUS | 3 refills | Status: DC | PRN

## 2017-10-06 MED ORDER — SODIUM CHLORIDE 0.9% FLUSH
10.0000 mL | Freq: Once | INTRAVENOUS | Status: AC
  Administered 2017-10-06: 10 mL via INTRAVENOUS
  Filled 2017-10-06: qty 10

## 2017-10-06 NOTE — Progress Notes (Signed)
[place] OFFICE PROGRESS NOTE  Patient Care Team: Gauger, Victoriano Lain, NP as PCP - General (Internal Medicine)  Cancer Staging No matching staging information was found for the patient.  # IRON DEF ANEMIA/ CKD [Dr.Lateef; Dr.Gittin] sep 2017- Stool/UA/Myeloma work up-NEG;colo- 2010 [per pt] Sep 2017- IV venofer; June 2018-  aranesp  # CKD-III [1.7]; Dr.Lateef   No history exists.     INTERVAL HISTORY:  Sara Bright 77 y.o.  female pleasant patient above history of Chronic anemia/chronic kidney disease; is here for follow-up.   Is a poor IV access.  Patient continues to complain of moderate to severe fatigue.  Complains of shortness of breath exertion.  REVIEW OF SYSTEMS:  A complete 10 point review of system is done which is negative except mentioned above/history of present illness.   PAST MEDICAL HISTORY :  Past Medical History:  Diagnosis Date  . Adenoma of colon   . Adenomatous colon polyp   . Anemia   . Cataract   . Cervical stenosis of spinal canal   . Chronic kidney disease   . Diabetes mellitus without complication ([place])   . GERD (gastroesophageal reflux disease)   . Glaucoma   . Heme positive stool   . History of UTI   . Hx of gallstones   . Hyperkalemia   . Hyperlipidemia   . Hyperlipidemia   . Hypertension   . Lumbar stenosis   . Neuropathy associated with endocrine disorder ([place])   . Obesity   . OSA on CPAP   . Osteoarthritis   . Osteoarthritis   . Retinopathy due to secondary diabetes ([place])   . Secondary hyperparathyroidism of renal origin [facility])     PAST SURGICAL HISTORY :   Past Surgical History:  Procedure Laterality Date  . ACDF X2    . BACK SURGERY    . Cataract extraction right    . catarect extraction left    . CHOLECYSTECTOMY    . COLONOSCOPY WITH PROPOFOL N/A 12/02/2016   Procedure: COLONOSCOPY WITH PROPOFOL;  Surgeon: Manya Silvas, MD;  Location: [facility] ENDOSCOPY;  Service: Endoscopy;  Laterality: N/A;  .  colonoscopy with removal lesions by snare    . Endoscoic carpal tunnel release    . ESOPHAGOGASTRODUODENOSCOPY (EGD) WITH PROPOFOL N/A 12/02/2016   Procedure: ESOPHAGOGASTRODUODENOSCOPY (EGD) WITH PROPOFOL;  Surgeon: Manya Silvas, MD;  Location: [facility] ENDOSCOPY;  Service: Endoscopy;  Laterality: N/A;  . IR IMAGING GUIDED PORT INSERTION  10/01/2017  . Laminectomy posterior lumbar facetectomy and formaninotomy w/Decomp    . Laminectony posterior cervicle decomp w/Facectomy and foraminotomy    . VEIN LIGATION AND STRIPPING Left     FAMILY HISTORY :   Family History  Problem Relation Age of Onset  . Coronary artery disease Mother   . Diabetes type II Mother   . Hypertension Mother   . Tuberculosis Father   . Stroke Father   . Diabetes type II Sister   . Migraines Sister   . Alcohol abuse Brother   . Coronary artery disease Brother   . Diabetes type II Brother   . Kidney cancer Brother   . Kidney disease Brother   . Heart attack Brother   . Hypertension Brother     SOCIAL HISTORY:   Social History   Tobacco Use  . Smoking status: Never Smoker  . Smokeless tobacco: Never Used  Substance Use Topics  . Alcohol use: No  . Drug use: No    ALLERGIES:  is allergic to hydrocodone-acetaminophen and statins.  MEDICATIONS:  Current Outpatient Medications  Medication Sig Dispense Refill  . acetaminophen (TYLENOL) 500 MG tablet Take 500 mg by mouth every 6 (six) hours as needed.     Marland Kitchen amLODipine (NORVASC) 5 MG tablet Take 5 mg by mouth daily.     Marland Kitchen aspirin EC 81 MG tablet Take 81 mg by mouth daily.    . ASSURE COMFORT LANCETS 30G MISC     . calcitRIOL (ROCALTROL) 0.25 MCG capsule Take 0.25 mcg by mouth 3 (three) times a week.     . carvedilol (COREG) 6.25 MG tablet Take 6.25 mg by mouth 2 (two) times daily.  12  . ferrous sulfate 325 (65 FE) MG tablet Take 325 mg by mouth daily.    . furosemide (LASIX) 40 MG tablet Take 40 mg by mouth daily.     Marland Kitchen gabapentin (NEURONTIN) 100 MG  capsule Take 100 mg by mouth 2 (two) times daily.     Marland Kitchen gemfibrozil (LOPID) 600 MG tablet Take 600 mg by mouth daily.     Marland Kitchen glimepiride (AMARYL) 1 MG tablet Take 1 mg by mouth daily with breakfast.     . Lancet Devices (ADJUSTABLE LANCING DEVICE) MISC     . latanoprost (XALATAN) 0.005 % ophthalmic solution Place 1 drop into both eyes.     Marland Kitchen lidocaine-prilocaine (EMLA) cream Apply 1 application topically as needed. 30 g 3  . losartan (COZAAR) 50 MG tablet Take 100 mg by mouth daily.     . Multiple Vitamins-Minerals (CENTRUM SILVER) tablet Take 1 tablet by mouth daily.     . Omega-3 Fatty Acids (FISH OIL) 1000 MG CAPS Take 1,000 mg by mouth daily.     Marland Kitchen omeprazole (PRILOSEC) 20 MG capsule Take 20 mg by mouth daily.     . ONE TOUCH ULTRA TEST test strip     . pioglitazone (ACTOS) 30 MG tablet Take 30 mg by mouth daily.     . timolol (TIMOPTIC-XR) 0.5 % ophthalmic gel-forming Place 1 drop into both eyes.      No current facility-administered medications for this visit.     PHYSICAL EXAMINATION:  BP 125/85   Pulse 69   Temp 97.6 F (36.4 C) (Tympanic)   Resp (!) 24   Ht 5\' 4"  (1.626 m)   Wt 260 lb (117.9 kg)   SpO2 100%   BMI 44.63 kg/m   Filed Weights   10/06/17 1102  Weight: 260 lb (117.9 kg)    GENERAL: Well-nourished well-developed; Alert, no distress and comfortable.  Accompanied by multiple family members. EYES: Positive for pallor. OROPHARYNX: no thrush or ulceration; good dentition  NECK: supple, no masses felt LYMPH:  no palpable lymphadenopathy in the cervical, axillary or inguinal regions LUNGS: clear to auscultation and  No wheeze or crackles HEART/CVS: regular rate & rhythm and no murmurs; No lower extremity edema ABDOMEN:abdomen soft, non-tender and normal bowel sounds Musculoskeletal:no cyanosis of digits and no clubbing  PSYCH: alert & oriented x 3 with fluent speech NEURO: no focal motor/sensory deficits SKIN:  no rashes or significant lesions  LABORATORY  DATA:  I have reviewed the data as listed    Component Value Date/Time   NA 141 10/01/2017 0810   NA 137 10/23/2012 2335   K 4.8 10/01/2017 0810   K 4.4 10/23/2012 2335   CL 110 10/01/2017 0810   CL 107 10/23/2012 2335   CO2 25 10/01/2017 0810   CO2 26 10/23/2012 2335  GLUCOSE 93 10/01/2017 0810   GLUCOSE 179 (H) 10/23/2012 2335   BUN 58 (H) 10/01/2017 0810   BUN 21 (H) 10/23/2012 2335   CREATININE 2.01 (H) 10/01/2017 0810   CREATININE 1.23 10/23/2012 2335   CALCIUM 9.7 10/01/2017 0810   CALCIUM 10.3 (H) 10/23/2012 2335   PROT 6.4 (L) 09/22/2017 1031   PROT 7.4 06/16/2011 1144   ALBUMIN 3.3 (L) 09/22/2017 1031   ALBUMIN 3.5 06/16/2011 1144   AST 20 09/22/2017 1031   AST 22 08/18/2011 1026   ALT 17 09/22/2017 1031   ALT 63 06/16/2011 1144   ALKPHOS 60 09/22/2017 1031   ALKPHOS 98 06/16/2011 1144   BILITOT 0.7 09/22/2017 1031   BILITOT 0.4 06/16/2011 1144   GFRNONAA 23 (L) 10/01/2017 0810   GFRNONAA 43 (L) 10/23/2012 2335   GFRAA 26 (L) 10/01/2017 0810   GFRAA 50 (L) 10/23/2012 2335    No results found for: SPEP, UPEP  Lab Results  Component Value Date   WBC 3.7 10/06/2017   NEUTROABS 2.1 10/06/2017   HGB 8.9 (L) 10/12/2017   HCT 27.6 (L) 10/12/2017   MCV 103.3 (H) 10/06/2017   PLT 186 10/06/2017      Chemistry      Component Value Date/Time   NA 141 10/01/2017 0810   NA 137 10/23/2012 2335   K 4.8 10/01/2017 0810   K 4.4 10/23/2012 2335   CL 110 10/01/2017 0810   CL 107 10/23/2012 2335   CO2 25 10/01/2017 0810   CO2 26 10/23/2012 2335   BUN 58 (H) 10/01/2017 0810   BUN 21 (H) 10/23/2012 2335   CREATININE 2.01 (H) 10/01/2017 0810   CREATININE 1.23 10/23/2012 2335      Component Value Date/Time   CALCIUM 9.7 10/01/2017 0810   CALCIUM 10.3 (H) 10/23/2012 2335   ALKPHOS 60 09/22/2017 1031   ALKPHOS 98 06/16/2011 1144   AST 20 09/22/2017 1031   AST 22 08/18/2011 1026   ALT 17 09/22/2017 1031   ALT 63 06/16/2011 1144   BILITOT 0.7 09/22/2017 1031    BILITOT 0.4 06/16/2011 1144     Results for TALICIA, SUI (MRN [id number]) as of 02/18/2017 10:08  Ref. Range 05/12/2016 08:53 09/01/2016 13:52 11/19/2016 08:42 12/22/2016 10:29 01/19/2017 10:20  Iron Latest Ref Range: 28 - 170 ug/dL 75 67 62 79 54  UIBC Latest Units: ug/dL 259 266 280 236 250  TIBC Latest Ref Range: 250 - 450 ug/dL 334 333 342 315 304  Saturation Ratios Latest Ref Range: 10.4 - 31.8 % 23 20 18 25 18   Ferritin Latest Ref Range: 11 - 307 ng/mL 651 (H) 467 (H) 413 (H) 633 (H) 374 (H)    RADIOGRAPHIC STUDIES: I have personally reviewed the radiological images as listed and agreed with the findings in the report. No results found.   ASSESSMENT & PLAN:  Anemia due to stage 3 chronic kidney disease # IDA/ CKD-worsening anemia /symptomatic fatigue.  Hemoccult positive stools above GI blood loss.  No evidence of hemolysis.  #Hemoglobin today is 8.3 hold PRBC transfusion; proceed with Aranesp today.  HOLD PRBC trasnfusion; proceed with arnaesp today; as planned Tuesday- for H&H/hold tube; IV venofer; discuss with Tiffany Kocher  CC: Dr.Lateef.   Orders Placed This Encounter  Procedures  . Hematocrit ([facility])    Standing Status:   Standing    Number of Occurrences:   20    Standing Expiration Date:   10/07/2018  . Hemoglobin [facility])    Standing Status:  Standing    Number of Occurrences:   20    Standing Expiration Date:   10/07/2018  . Hold Tube- Blood Bank    Standing Status:   Standing    Number of Occurrences:   20    Standing Expiration Date:   10/07/2018   All questions were answered. The patient knows to call the clinic with any problems, questions or concerns.      Cammie Sickle, MD 10/17/2017 8:02 PM

## 2017-10-06 NOTE — Assessment & Plan Note (Addendum)
#   IDA/ CKD-worsening anemia /symptomatic fatigue.  Hemoccult positive stools above GI blood loss.  No evidence of hemolysis.  #Hemoglobin today is 8.3 hold PRBC transfusion; proceed with Aranesp today.  HOLD PRBC trasnfusion; proceed with arnaesp today; as planned Tuesday- for H&H/hold tube; IV venofer; discuss with Tiffany Kocher  CC: Dr.Lateef.

## 2017-10-07 ENCOUNTER — Other Ambulatory Visit: Payer: Medicare Other

## 2017-10-08 DIAGNOSIS — D509 Iron deficiency anemia, unspecified: Secondary | ICD-10-CM | POA: Insufficient documentation

## 2017-10-08 DIAGNOSIS — R195 Other fecal abnormalities: Secondary | ICD-10-CM | POA: Insufficient documentation

## 2017-10-12 ENCOUNTER — Inpatient Hospital Stay: Payer: Medicare HMO

## 2017-10-12 ENCOUNTER — Inpatient Hospital Stay: Payer: Medicare HMO | Attending: Internal Medicine | Admitting: Internal Medicine

## 2017-10-12 VITALS — BP 131/68 | HR 72 | Temp 97.7°F | Resp 16 | Wt 256.5 lb

## 2017-10-12 VITALS — BP 129/71 | HR 56 | Resp 16

## 2017-10-12 DIAGNOSIS — N183 Chronic kidney disease, stage 3 unspecified: Secondary | ICD-10-CM

## 2017-10-12 DIAGNOSIS — Z79899 Other long term (current) drug therapy: Secondary | ICD-10-CM

## 2017-10-12 DIAGNOSIS — I129 Hypertensive chronic kidney disease with stage 1 through stage 4 chronic kidney disease, or unspecified chronic kidney disease: Secondary | ICD-10-CM | POA: Diagnosis not present

## 2017-10-12 DIAGNOSIS — D500 Iron deficiency anemia secondary to blood loss (chronic): Secondary | ICD-10-CM | POA: Diagnosis present

## 2017-10-12 DIAGNOSIS — D631 Anemia in chronic kidney disease: Secondary | ICD-10-CM

## 2017-10-12 DIAGNOSIS — E1122 Type 2 diabetes mellitus with diabetic chronic kidney disease: Secondary | ICD-10-CM | POA: Diagnosis not present

## 2017-10-12 DIAGNOSIS — Z7982 Long term (current) use of aspirin: Secondary | ICD-10-CM | POA: Diagnosis not present

## 2017-10-12 DIAGNOSIS — E1140 Type 2 diabetes mellitus with diabetic neuropathy, unspecified: Secondary | ICD-10-CM

## 2017-10-12 DIAGNOSIS — Z7984 Long term (current) use of oral hypoglycemic drugs: Secondary | ICD-10-CM

## 2017-10-12 LAB — SAMPLE TO BLOOD BANK

## 2017-10-12 LAB — HEMATOCRIT: HEMATOCRIT: 27.6 % — AB (ref 35.0–47.0)

## 2017-10-12 LAB — HEMOGLOBIN: HEMOGLOBIN: 8.9 g/dL — AB (ref 12.0–16.0)

## 2017-10-12 MED ORDER — IRON SUCROSE 20 MG/ML IV SOLN
200.0000 mg | Freq: Once | INTRAVENOUS | Status: AC
  Administered 2017-10-12: 200 mg via INTRAVENOUS
  Filled 2017-10-12: qty 10

## 2017-10-12 MED ORDER — HEPARIN SOD (PORK) LOCK FLUSH 100 UNIT/ML IV SOLN
500.0000 [IU] | Freq: Once | INTRAVENOUS | Status: AC
  Administered 2017-10-12: 500 [IU] via INTRAVENOUS

## 2017-10-12 MED ORDER — SODIUM CHLORIDE 0.9 % IV SOLN
Freq: Once | INTRAVENOUS | Status: AC
  Administered 2017-10-12: 10:00:00 via INTRAVENOUS
  Filled 2017-10-12: qty 1000

## 2017-10-12 MED ORDER — SODIUM CHLORIDE 0.9% FLUSH
10.0000 mL | INTRAVENOUS | Status: DC | PRN
  Administered 2017-10-12: 10 mL via INTRAVENOUS
  Filled 2017-10-12: qty 10

## 2017-10-12 NOTE — Patient Instructions (Signed)
Iron Sucrose injection What is this medicine? IRON SUCROSE (AHY ern SOO krohs) is an iron complex. Iron is used to make healthy red blood cells, which carry oxygen and nutrients throughout the body. This medicine is used to treat iron deficiency anemia in people with chronic kidney disease. This medicine may be used for other purposes; ask your health care provider or pharmacist if you have questions. COMMON BRAND NAME(S): Venofer What should I tell my health care provider before I take this medicine? They need to know if you have any of these conditions: -anemia not caused by low iron levels -heart disease -high levels of iron in the blood -kidney disease -liver disease -an unusual or allergic reaction to iron, other medicines, foods, dyes, or preservatives -pregnant or trying to get pregnant -breast-feeding How should I use this medicine? This medicine is for infusion into a vein. It is given by a health care professional in a hospital or clinic setting. Talk to your pediatrician regarding the use of this medicine in children. While this drug may be prescribed for children as young as 2 years for selected conditions, precautions do apply. Overdosage: If you think you have taken too much of this medicine contact a poison control center or emergency room at once. NOTE: This medicine is only for you. Do not share this medicine with others. What if I miss a dose? It is important not to miss your dose. Call your doctor or health care professional if you are unable to keep an appointment. What may interact with this medicine? Do not take this medicine with any of the following medications: -deferoxamine -dimercaprol -other iron products This medicine may also interact with the following medications: -chloramphenicol -deferasirox This list may not describe all possible interactions. Give your health care provider a list of all the medicines, herbs, non-prescription drugs, or dietary  supplements you use. Also tell them if you smoke, drink alcohol, or use illegal drugs. Some items may interact with your medicine. What should I watch for while using this medicine? Visit your doctor or healthcare professional regularly. Tell your doctor or healthcare professional if your symptoms do not start to get better or if they get worse. You may need blood work done while you are taking this medicine. You may need to follow a special diet. Talk to your doctor. Foods that contain iron include: whole grains/cereals, dried fruits, beans, or peas, leafy green vegetables, and organ meats (liver, kidney). What side effects may I notice from receiving this medicine? Side effects that you should report to your doctor or health care professional as soon as possible: -allergic reactions like skin rash, itching or hives, swelling of the face, lips, or tongue -breathing problems -changes in blood pressure -cough -fast, irregular heartbeat -feeling faint or lightheaded, falls -fever or chills -flushing, sweating, or hot feelings -joint or muscle aches/pains -seizures -swelling of the ankles or feet -unusually weak or tired Side effects that usually do not require medical attention (report to your doctor or health care professional if they continue or are bothersome): -diarrhea -feeling achy -headache -irritation at site where injected -nausea, vomiting -stomach upset -tiredness This list may not describe all possible side effects. Call your doctor for medical advice about side effects. You may report side effects to FDA at 1-800-FDA-1088. Where should I keep my medicine? This drug is given in a hospital or clinic and will not be stored at home. NOTE: This sheet is a summary. It may not cover all possible information. If   you have questions about this medicine, talk to your doctor, pharmacist, or health care provider.  2018 Elsevier/Gold Standard (2010-12-04 17:14:35)  

## 2017-10-12 NOTE — Assessment & Plan Note (Addendum)
#  IDA/ CKD-hemoglobin is today's 8.9.  Improving.  Status post Aranesp last week.  Proceed with IV iron today.  #Acute drop in hemoglobin-to 6.5 from baseline 9-10 is likely secondary to ongoing chronic GI bleed./Positive Hemoccult.  Await repeat colonoscopy with Dr. Vira Agar.  # proceed with IV venofer today; awaiting colo/met with dr.Elliot  # IV venofer today; 1 week- H&H/ aranesp; in again MD/H&H Lupita Dawn  CC: Dr.Lateef;

## 2017-10-12 NOTE — Progress Notes (Signed)
[place] OFFICE PROGRESS NOTE  Patient Care Team: Gauger, Victoriano Lain, NP as PCP - General (Internal Medicine)  Cancer Staging No matching staging information was found for the patient.  # IRON DEF ANEMIA/ CKD [Dr.Lateef; Dr.Gittin] sep 2017- Stool/UA/Myeloma work up-NEG;colo- 2010 [per pt] Sep 2017- IV venofer; June 2018-  Aranesp; July 2019-stool occult test positive  # CKD-III [1.7]; Dr.Lateef   No history exists.    INTERVAL HISTORY:  Sara Bright 77 y.o.  female pleasant patient above history of Chronic anemia/chronic kidney disease; is here for follow-up.   In the interim patient had a port placed second of poor IV access.  In the interim patient also evaluated by GI recommended repeat colonoscopy.  Patient's hemoglobin last week was 8.0; patient received Aranesp.  No PRBC transfusion.  She continues to complain of fatigue.  Complains of shortness of breath exertion.  Denies any worsening constipation.  Denies any obvious blood in stools.  She denies any significant weight gain or swelling in the legs.   REVIEW OF SYSTEMS:  A complete 10 point review of system is done which is negative except mentioned above/history of present illness.   PAST MEDICAL HISTORY :  Past Medical History:  Diagnosis Date  . Adenoma of colon   . Adenomatous colon polyp   . Anemia   . Cataract   . Cervical stenosis of spinal canal   . Chronic kidney disease   . Diabetes mellitus without complication ([place])   . GERD (gastroesophageal reflux disease)   . Glaucoma   . Heme positive stool   . History of UTI   . Hx of gallstones   . Hyperkalemia   . Hyperlipidemia   . Hyperlipidemia   . Hypertension   . Lumbar stenosis   . Neuropathy associated with endocrine disorder ([place])   . Obesity   . OSA on CPAP   . Osteoarthritis   . Osteoarthritis   . Retinopathy due to secondary diabetes ([place])   . Secondary hyperparathyroidism of renal origin [facility])     PAST SURGICAL  HISTORY :   Past Surgical History:  Procedure Laterality Date  . ACDF X2    . BACK SURGERY    . Cataract extraction right    . catarect extraction left    . CHOLECYSTECTOMY    . COLONOSCOPY WITH PROPOFOL N/A 12/02/2016   Procedure: COLONOSCOPY WITH PROPOFOL;  Surgeon: Manya Silvas, MD;  Location: [facility] ENDOSCOPY;  Service: Endoscopy;  Laterality: N/A;  . colonoscopy with removal lesions by snare    . Endoscoic carpal tunnel release    . ESOPHAGOGASTRODUODENOSCOPY (EGD) WITH PROPOFOL N/A 12/02/2016   Procedure: ESOPHAGOGASTRODUODENOSCOPY (EGD) WITH PROPOFOL;  Surgeon: Manya Silvas, MD;  Location: [facility] ENDOSCOPY;  Service: Endoscopy;  Laterality: N/A;  . IR IMAGING GUIDED PORT INSERTION  10/01/2017  . Laminectomy posterior lumbar facetectomy and formaninotomy w/Decomp    . Laminectony posterior cervicle decomp w/Facectomy and foraminotomy    . VEIN LIGATION AND STRIPPING Left     FAMILY HISTORY :   Family History  Problem Relation Age of Onset  . Coronary artery disease Mother   . Diabetes type II Mother   . Hypertension Mother   . Tuberculosis Father   . Stroke Father   . Diabetes type II Sister   . Migraines Sister   . Alcohol abuse Brother   . Coronary artery disease Brother   . Diabetes type II Brother   . Kidney cancer Brother   .  Kidney disease Brother   . Heart attack Brother   . Hypertension Brother     SOCIAL HISTORY:   Social History   Tobacco Use  . Smoking status: Never Smoker  . Smokeless tobacco: Never Used  Substance Use Topics  . Alcohol use: No  . Drug use: No    ALLERGIES:  is allergic to hydrocodone-acetaminophen and statins.  MEDICATIONS:  Current Outpatient Medications  Medication Sig Dispense Refill  . acetaminophen (TYLENOL) 500 MG tablet Take 500 mg by mouth every 6 (six) hours as needed.     Marland Kitchen amLODipine (NORVASC) 5 MG tablet Take 5 mg by mouth daily.     Marland Kitchen aspirin EC 81 MG tablet Take 81 mg by mouth daily.    . ASSURE COMFORT  LANCETS 30G MISC     . calcitRIOL (ROCALTROL) 0.25 MCG capsule Take 0.25 mcg by mouth 3 (three) times a week.     . carvedilol (COREG) 6.25 MG tablet Take 6.25 mg by mouth 2 (two) times daily.  12  . ferrous sulfate 325 (65 FE) MG tablet Take 325 mg by mouth daily.    . furosemide (LASIX) 40 MG tablet Take 40 mg by mouth daily.     Marland Kitchen gabapentin (NEURONTIN) 100 MG capsule Take 100 mg by mouth 2 (two) times daily.     Marland Kitchen gemfibrozil (LOPID) 600 MG tablet Take 600 mg by mouth daily.     Marland Kitchen glimepiride (AMARYL) 1 MG tablet Take 1 mg by mouth daily with breakfast.     . Lancet Devices (ADJUSTABLE LANCING DEVICE) MISC     . latanoprost (XALATAN) 0.005 % ophthalmic solution Place 1 drop into both eyes.     Marland Kitchen lidocaine-prilocaine (EMLA) cream Apply 1 application topically as needed. 30 g 3  . losartan (COZAAR) 50 MG tablet Take 100 mg by mouth daily.     . Multiple Vitamins-Minerals (CENTRUM SILVER) tablet Take 1 tablet by mouth daily.     . Omega-3 Fatty Acids (FISH OIL) 1000 MG CAPS Take 1,000 mg by mouth daily.     Marland Kitchen omeprazole (PRILOSEC) 20 MG capsule Take 20 mg by mouth daily.     . ONE TOUCH ULTRA TEST test strip     . pioglitazone (ACTOS) 30 MG tablet Take 30 mg by mouth daily.     . timolol (TIMOPTIC-XR) 0.5 % ophthalmic gel-forming Place 1 drop into both eyes.      No current facility-administered medications for this visit.     PHYSICAL EXAMINATION:  BP 131/68 (BP Location: Left Arm, Patient Position: Sitting)   Pulse 72   Temp 97.7 F (36.5 C) (Tympanic)   Resp 16   Wt 256 lb 8.1 oz (116.3 kg)   BMI 44.03 kg/m   Filed Weights   10/12/17 0858  Weight: 256 lb 8.1 oz (116.3 kg)    GENERAL: Well-nourished well-developed; Alert, no distress and comfortable.  Accompanied by sister family members. EYES: Positive for pallor no icterus.  OROPHARYNX: no thrush or ulceration; good dentition  NECK: supple, no masses felt LYMPH:  no palpable lymphadenopathy in the cervical, axillary or  inguinal regions LUNGS: clear to auscultation and  No wheeze or crackles HEART/CVS: regular rate & rhythm and no murmurs; No lower extremity edema ABDOMEN:abdomen soft, non-tender and normal bowel sounds Musculoskeletal:no cyanosis of digits and no clubbing  PSYCH: alert & oriented x 3 with fluent speech NEURO: no focal motor/sensory deficits SKIN:  no rashes or significant lesions  LABORATORY DATA:  I  have reviewed the data as listed    Component Value Date/Time   NA 141 10/01/2017 0810   NA 137 10/23/2012 2335   K 4.8 10/01/2017 0810   K 4.4 10/23/2012 2335   CL 110 10/01/2017 0810   CL 107 10/23/2012 2335   CO2 25 10/01/2017 0810   CO2 26 10/23/2012 2335   GLUCOSE 93 10/01/2017 0810   GLUCOSE 179 (H) 10/23/2012 2335   BUN 58 (H) 10/01/2017 0810   BUN 21 (H) 10/23/2012 2335   CREATININE 2.01 (H) 10/01/2017 0810   CREATININE 1.23 10/23/2012 2335   CALCIUM 9.7 10/01/2017 0810   CALCIUM 10.3 (H) 10/23/2012 2335   PROT 6.4 (L) 09/22/2017 1031   PROT 7.4 06/16/2011 1144   ALBUMIN 3.3 (L) 09/22/2017 1031   ALBUMIN 3.5 06/16/2011 1144   AST 20 09/22/2017 1031   AST 22 08/18/2011 1026   ALT 17 09/22/2017 1031   ALT 63 06/16/2011 1144   ALKPHOS 60 09/22/2017 1031   ALKPHOS 98 06/16/2011 1144   BILITOT 0.7 09/22/2017 1031   BILITOT 0.4 06/16/2011 1144   GFRNONAA 23 (L) 10/01/2017 0810   GFRNONAA 43 (L) 10/23/2012 2335   GFRAA 26 (L) 10/01/2017 0810   GFRAA 50 (L) 10/23/2012 2335    No results found for: SPEP, UPEP  Lab Results  Component Value Date   WBC 3.7 10/06/2017   NEUTROABS 2.1 10/06/2017   HGB 8.9 (L) 10/12/2017   HCT 27.6 (L) 10/12/2017   MCV 103.3 (H) 10/06/2017   PLT 186 10/06/2017      Chemistry      Component Value Date/Time   NA 141 10/01/2017 0810   NA 137 10/23/2012 2335   K 4.8 10/01/2017 0810   K 4.4 10/23/2012 2335   CL 110 10/01/2017 0810   CL 107 10/23/2012 2335   CO2 25 10/01/2017 0810   CO2 26 10/23/2012 2335   BUN 58 (H) 10/01/2017  0810   BUN 21 (H) 10/23/2012 2335   CREATININE 2.01 (H) 10/01/2017 0810   CREATININE 1.23 10/23/2012 2335      Component Value Date/Time   CALCIUM 9.7 10/01/2017 0810   CALCIUM 10.3 (H) 10/23/2012 2335   ALKPHOS 60 09/22/2017 1031   ALKPHOS 98 06/16/2011 1144   AST 20 09/22/2017 1031   AST 22 08/18/2011 1026   ALT 17 09/22/2017 1031   ALT 63 06/16/2011 1144   BILITOT 0.7 09/22/2017 1031   BILITOT 0.4 06/16/2011 1144     Results for PINKIE, MANGER (MRN [id number]) as of 02/18/2017 10:08  Ref. Range 05/12/2016 08:53 09/01/2016 13:52 11/19/2016 08:42 12/22/2016 10:29 01/19/2017 10:20  Iron Latest Ref Range: 28 - 170 ug/dL 75 67 62 79 54  UIBC Latest Units: ug/dL 259 266 280 236 250  TIBC Latest Ref Range: 250 - 450 ug/dL 334 333 342 315 304  Saturation Ratios Latest Ref Range: 10.4 - 31.8 % '23 20 18 25 18  ' Ferritin Latest Ref Range: 11 - 307 ng/mL 651 (H) 467 (H) 413 (H) 633 (H) 374 (H)    RADIOGRAPHIC STUDIES: I have personally reviewed the radiological images as listed and agreed with the findings in the report. No results found.   ASSESSMENT & PLAN:  Anemia due to stage 3 chronic kidney disease # IDA/ CKD-hemoglobin is today's 8.9.  Improving.  Status post Aranesp last week.  Proceed with IV iron today.  #Acute drop in hemoglobin-to 6.5 from baseline 9-10 is likely secondary to ongoing chronic GI bleed./Positive Hemoccult.  Await repeat colonoscopy with Dr. Vira Agar.  # proceed with IV venofer today; awaiting colo/met with dr.Elliot  # IV venofer today; 1 week- H&H/ aranesp; in again MD/H&H Lupita Dawn  CC: Dr.Lateef;   No orders of the defined types were placed in this encounter.  All questions were answered. The patient knows to call the clinic with any problems, questions or concerns.      Cammie Sickle, MD 10/14/2017 6:09 PM

## 2017-10-19 ENCOUNTER — Inpatient Hospital Stay: Payer: Medicare HMO

## 2017-10-19 VITALS — BP 142/78 | HR 67 | Resp 18

## 2017-10-19 DIAGNOSIS — N183 Chronic kidney disease, stage 3 (moderate): Principal | ICD-10-CM

## 2017-10-19 DIAGNOSIS — D631 Anemia in chronic kidney disease: Secondary | ICD-10-CM

## 2017-10-19 DIAGNOSIS — D500 Iron deficiency anemia secondary to blood loss (chronic): Secondary | ICD-10-CM | POA: Diagnosis not present

## 2017-10-19 LAB — HEMATOCRIT: HCT: 29.3 % — ABNORMAL LOW (ref 35.0–47.0)

## 2017-10-19 LAB — HEMOGLOBIN: HEMOGLOBIN: 9.2 g/dL — AB (ref 12.0–16.0)

## 2017-10-19 LAB — SAMPLE TO BLOOD BANK

## 2017-10-19 MED ORDER — DARBEPOETIN ALFA 300 MCG/0.6ML IJ SOSY
300.0000 ug | PREFILLED_SYRINGE | Freq: Once | INTRAMUSCULAR | Status: AC
  Administered 2017-10-19: 300 ug via SUBCUTANEOUS

## 2017-10-19 NOTE — Patient Instructions (Signed)

## 2017-10-20 DIAGNOSIS — G4733 Obstructive sleep apnea (adult) (pediatric): Secondary | ICD-10-CM | POA: Diagnosis not present

## 2017-10-26 ENCOUNTER — Inpatient Hospital Stay (HOSPITAL_BASED_OUTPATIENT_CLINIC_OR_DEPARTMENT_OTHER): Payer: Medicare HMO | Admitting: Internal Medicine

## 2017-10-26 ENCOUNTER — Other Ambulatory Visit: Payer: Self-pay | Admitting: *Deleted

## 2017-10-26 ENCOUNTER — Inpatient Hospital Stay: Payer: Medicare HMO

## 2017-10-26 VITALS — BP 122/65 | HR 60 | Temp 96.9°F | Resp 20

## 2017-10-26 VITALS — BP 145/75 | HR 59 | Temp 97.7°F | Resp 16 | Wt 255.0 lb

## 2017-10-26 DIAGNOSIS — Z79899 Other long term (current) drug therapy: Secondary | ICD-10-CM

## 2017-10-26 DIAGNOSIS — D631 Anemia in chronic kidney disease: Secondary | ICD-10-CM

## 2017-10-26 DIAGNOSIS — N183 Chronic kidney disease, stage 3 unspecified: Secondary | ICD-10-CM

## 2017-10-26 DIAGNOSIS — I129 Hypertensive chronic kidney disease with stage 1 through stage 4 chronic kidney disease, or unspecified chronic kidney disease: Secondary | ICD-10-CM | POA: Diagnosis not present

## 2017-10-26 DIAGNOSIS — D500 Iron deficiency anemia secondary to blood loss (chronic): Secondary | ICD-10-CM

## 2017-10-26 DIAGNOSIS — Z7982 Long term (current) use of aspirin: Secondary | ICD-10-CM | POA: Diagnosis not present

## 2017-10-26 DIAGNOSIS — E1140 Type 2 diabetes mellitus with diabetic neuropathy, unspecified: Secondary | ICD-10-CM | POA: Diagnosis not present

## 2017-10-26 DIAGNOSIS — E1122 Type 2 diabetes mellitus with diabetic chronic kidney disease: Secondary | ICD-10-CM

## 2017-10-26 DIAGNOSIS — Z7984 Long term (current) use of oral hypoglycemic drugs: Secondary | ICD-10-CM | POA: Diagnosis not present

## 2017-10-26 LAB — SAMPLE TO BLOOD BANK

## 2017-10-26 LAB — HEMATOCRIT: HCT: 31.2 % — ABNORMAL LOW (ref 35.0–47.0)

## 2017-10-26 LAB — HEMOGLOBIN: Hemoglobin: 10.1 g/dL — ABNORMAL LOW (ref 12.0–16.0)

## 2017-10-26 MED ORDER — SODIUM CHLORIDE 0.9% FLUSH
10.0000 mL | INTRAVENOUS | Status: DC | PRN
  Administered 2017-10-26: 10 mL
  Filled 2017-10-26: qty 10

## 2017-10-26 MED ORDER — HEPARIN SOD (PORK) LOCK FLUSH 100 UNIT/ML IV SOLN
500.0000 [IU] | Freq: Once | INTRAVENOUS | Status: AC | PRN
  Administered 2017-10-26: 500 [IU]

## 2017-10-26 MED ORDER — IRON SUCROSE 20 MG/ML IV SOLN
200.0000 mg | Freq: Once | INTRAVENOUS | Status: AC
  Administered 2017-10-26: 200 mg via INTRAVENOUS
  Filled 2017-10-26: qty 10

## 2017-10-26 MED ORDER — SODIUM CHLORIDE 0.9 % IV SOLN
Freq: Once | INTRAVENOUS | Status: AC
  Administered 2017-10-26: 11:00:00 via INTRAVENOUS
  Filled 2017-10-26: qty 1000

## 2017-10-26 NOTE — Progress Notes (Signed)
[place] OFFICE PROGRESS NOTE  Patient Care Team: Gauger, Victoriano Lain, NP as PCP - General (Internal Medicine)  Cancer Staging No matching staging information was found for the patient.  # IRON DEF ANEMIA/ CKD [Dr.Lateef; Dr.Gittin] sep 2017- Stool/UA/Myeloma work up-NEG;colo- 2010 [per pt] Sep 2017- IV venofer; June 2018-  Aranesp; July 2019-stool occult test positive  # CKD-III [1.7]; Dr.Lateef   No history exists.    INTERVAL HISTORY:  Sara Bright 77 y.o.  female pleasant patient above history of Chronic anemia/chronic kidney disease; is here for follow-up.  Patient has been getting IV Venofer weekly and also Aranesp weekly for the last 4 weeks.  Patient is awaiting EGD/colonoscopy in September 3.  Patient energy levels are improving.  Her last transfusion was approximately 3 weeks ago.  Complains of shortness of breath exertion-is improving.  Denies any worsening constipation.  She denies any significant weight gain or swelling in the legs.   REVIEW OF SYSTEMS:  A complete 10 point review of system is done which is negative except mentioned above/history of present illness.   PAST MEDICAL HISTORY :  Past Medical History:  Diagnosis Date  . Adenoma of colon   . Adenomatous colon polyp   . Anemia   . Cataract   . Cervical stenosis of spinal canal   . Chronic kidney disease   . Diabetes mellitus without complication ([place])   . GERD (gastroesophageal reflux disease)   . Glaucoma   . Heme positive stool   . History of UTI   . Hx of gallstones   . Hyperkalemia   . Hyperlipidemia   . Hyperlipidemia   . Hypertension   . Lumbar stenosis   . Neuropathy associated with endocrine disorder ([place])   . Obesity   . OSA on CPAP   . Osteoarthritis   . Osteoarthritis   . Retinopathy due to secondary diabetes ([place])   . Secondary hyperparathyroidism of renal origin [facility])     PAST SURGICAL HISTORY :   Past Surgical History:  Procedure Laterality Date  .  ACDF X2    . BACK SURGERY    . Cataract extraction right    . catarect extraction left    . CHOLECYSTECTOMY    . COLONOSCOPY WITH PROPOFOL N/A 12/02/2016   Procedure: COLONOSCOPY WITH PROPOFOL;  Surgeon: Manya Silvas, MD;  Location: [facility] ENDOSCOPY;  Service: Endoscopy;  Laterality: N/A;  . colonoscopy with removal lesions by snare    . Endoscoic carpal tunnel release    . ESOPHAGOGASTRODUODENOSCOPY (EGD) WITH PROPOFOL N/A 12/02/2016   Procedure: ESOPHAGOGASTRODUODENOSCOPY (EGD) WITH PROPOFOL;  Surgeon: Manya Silvas, MD;  Location: [facility] ENDOSCOPY;  Service: Endoscopy;  Laterality: N/A;  . IR IMAGING GUIDED PORT INSERTION  10/01/2017  . Laminectomy posterior lumbar facetectomy and formaninotomy w/Decomp    . Laminectony posterior cervicle decomp w/Facectomy and foraminotomy    . VEIN LIGATION AND STRIPPING Left     FAMILY HISTORY :   Family History  Problem Relation Age of Onset  . Coronary artery disease Mother   . Diabetes type II Mother   . Hypertension Mother   . Tuberculosis Father   . Stroke Father   . Diabetes type II Sister   . Migraines Sister   . Alcohol abuse Brother   . Coronary artery disease Brother   . Diabetes type II Brother   . Kidney cancer Brother   . Kidney disease Brother   . Heart attack Brother   . Hypertension  Brother     SOCIAL HISTORY:   Social History   Tobacco Use  . Smoking status: Never Smoker  . Smokeless tobacco: Never Used  Substance Use Topics  . Alcohol use: No  . Drug use: No    ALLERGIES:  is allergic to hydrocodone-acetaminophen and statins.  MEDICATIONS:  Current Outpatient Medications  Medication Sig Dispense Refill  . acetaminophen (TYLENOL) 500 MG tablet Take 500 mg by mouth every 6 (six) hours as needed.     Marland Kitchen amLODipine (NORVASC) 5 MG tablet Take 5 mg by mouth daily.     Marland Kitchen aspirin EC 81 MG tablet Take 81 mg by mouth daily.    . ASSURE COMFORT LANCETS 30G MISC     . calcitRIOL (ROCALTROL) 0.25 MCG capsule Take  0.25 mcg by mouth 3 (three) times a week.     . carvedilol (COREG) 6.25 MG tablet Take 6.25 mg by mouth 2 (two) times daily.  12  . ferrous sulfate 325 (65 FE) MG tablet Take 325 mg by mouth daily.    . furosemide (LASIX) 40 MG tablet Take 40 mg by mouth daily.     Marland Kitchen gabapentin (NEURONTIN) 100 MG capsule Take 100 mg by mouth 2 (two) times daily.     Marland Kitchen gemfibrozil (LOPID) 600 MG tablet Take 600 mg by mouth daily.     Marland Kitchen glimepiride (AMARYL) 1 MG tablet Take 1 mg by mouth daily with breakfast.     . Lancet Devices (ADJUSTABLE LANCING DEVICE) MISC     . latanoprost (XALATAN) 0.005 % ophthalmic solution Place 1 drop into both eyes.     Marland Kitchen lidocaine-prilocaine (EMLA) cream Apply 1 application topically as needed. 30 g 3  . losartan (COZAAR) 50 MG tablet Take 100 mg by mouth daily.     . Multiple Vitamins-Minerals (CENTRUM SILVER) tablet Take 1 tablet by mouth daily.     . Omega-3 Fatty Acids (FISH OIL) 1000 MG CAPS Take 1,000 mg by mouth daily.     Marland Kitchen omeprazole (PRILOSEC) 20 MG capsule Take 20 mg by mouth daily.     . ONE TOUCH ULTRA TEST test strip     . pioglitazone (ACTOS) 30 MG tablet Take 30 mg by mouth daily.     . timolol (TIMOPTIC-XR) 0.5 % ophthalmic gel-forming Place 1 drop into both eyes.      No current facility-administered medications for this visit.    Facility-Administered Medications Ordered in Other Visits  Medication Dose Route Frequency Provider Last Rate Last Dose  . 0.9 %  sodium chloride infusion   Intravenous Once Charlaine Dalton R, MD      . heparin lock flush 100 unit/mL  500 Units Intracatheter Once PRN Cammie Sickle, MD      . iron sucrose (VENOFER) 200 mg IVPB  200 mg Intravenous Once Charlaine Dalton R, MD      . sodium chloride flush (NS) 0.9 % injection 10 mL  10 mL Intracatheter PRN Cammie Sickle, MD        PHYSICAL EXAMINATION:  BP (!) 145/75 (BP Location: Left Arm, Patient Position: Sitting)   Pulse (!) 59   Temp 97.7 F (36.5 C)  (Tympanic)   Resp 16   Wt 255 lb (115.7 kg)   BMI 43.77 kg/m   Filed Weights   10/26/17 1020  Weight: 255 lb (115.7 kg)    GENERAL: Well-nourished well-developed; Alert, no distress and comfortable.  Accompanied by sister family members. EYES: Positive for pallor no icterus.  OROPHARYNX: no thrush or ulceration; good dentition  NECK: supple, no masses felt LYMPH:  no palpable lymphadenopathy in the cervical, axillary or inguinal regions LUNGS: clear to auscultation and  No wheeze or crackles HEART/CVS: regular rate & rhythm and no murmurs; No lower extremity edema ABDOMEN:abdomen soft, non-tender and normal bowel sounds Musculoskeletal:no cyanosis of digits and no clubbing  PSYCH: alert & oriented x 3 with fluent speech NEURO: no focal motor/sensory deficits SKIN:  no rashes or significant lesions  LABORATORY DATA:  I have reviewed the data as listed    Component Value Date/Time   NA 141 10/01/2017 0810   NA 137 10/23/2012 2335   K 4.8 10/01/2017 0810   K 4.4 10/23/2012 2335   CL 110 10/01/2017 0810   CL 107 10/23/2012 2335   CO2 25 10/01/2017 0810   CO2 26 10/23/2012 2335   GLUCOSE 93 10/01/2017 0810   GLUCOSE 179 (H) 10/23/2012 2335   BUN 58 (H) 10/01/2017 0810   BUN 21 (H) 10/23/2012 2335   CREATININE 2.01 (H) 10/01/2017 0810   CREATININE 1.23 10/23/2012 2335   CALCIUM 9.7 10/01/2017 0810   CALCIUM 10.3 (H) 10/23/2012 2335   PROT 6.4 (L) 09/22/2017 1031   PROT 7.4 06/16/2011 1144   ALBUMIN 3.3 (L) 09/22/2017 1031   ALBUMIN 3.5 06/16/2011 1144   AST 20 09/22/2017 1031   AST 22 08/18/2011 1026   ALT 17 09/22/2017 1031   ALT 63 06/16/2011 1144   ALKPHOS 60 09/22/2017 1031   ALKPHOS 98 06/16/2011 1144   BILITOT 0.7 09/22/2017 1031   BILITOT 0.4 06/16/2011 1144   GFRNONAA 23 (L) 10/01/2017 0810   GFRNONAA 43 (L) 10/23/2012 2335   GFRAA 26 (L) 10/01/2017 0810   GFRAA 50 (L) 10/23/2012 2335    No results found for: SPEP, UPEP  Lab Results  Component Value  Date   WBC 3.7 10/06/2017   NEUTROABS 2.1 10/06/2017   HGB 10.1 (L) 10/26/2017   HCT 31.2 (L) 10/26/2017   MCV 103.3 (H) 10/06/2017   PLT 186 10/06/2017      Chemistry      Component Value Date/Time   NA 141 10/01/2017 0810   NA 137 10/23/2012 2335   K 4.8 10/01/2017 0810   K 4.4 10/23/2012 2335   CL 110 10/01/2017 0810   CL 107 10/23/2012 2335   CO2 25 10/01/2017 0810   CO2 26 10/23/2012 2335   BUN 58 (H) 10/01/2017 0810   BUN 21 (H) 10/23/2012 2335   CREATININE 2.01 (H) 10/01/2017 0810   CREATININE 1.23 10/23/2012 2335      Component Value Date/Time   CALCIUM 9.7 10/01/2017 0810   CALCIUM 10.3 (H) 10/23/2012 2335   ALKPHOS 60 09/22/2017 1031   ALKPHOS 98 06/16/2011 1144   AST 20 09/22/2017 1031   AST 22 08/18/2011 1026   ALT 17 09/22/2017 1031   ALT 63 06/16/2011 1144   BILITOT 0.7 09/22/2017 1031   BILITOT 0.4 06/16/2011 1144     Results for NYEEMAH, JENNETTE (MRN [id number]) as of 02/18/2017 10:08  Ref. Range 05/12/2016 08:53 09/01/2016 13:52 11/19/2016 08:42 12/22/2016 10:29 01/19/2017 10:20  Iron Latest Ref Range: 28 - 170 ug/dL 75 67 62 79 54  UIBC Latest Units: ug/dL 259 266 280 236 250  TIBC Latest Ref Range: 250 - 450 ug/dL 334 333 342 315 304  Saturation Ratios Latest Ref Range: 10.4 - 31.8 % 23 20 18 25 18   Ferritin Latest Ref Range: 11 - 307  ng/mL 651 (H) 467 (H) 413 (H) 633 (H) 374 (H)    RADIOGRAPHIC STUDIES: I have personally reviewed the radiological images as listed and agreed with the findings in the report. No results found.   ASSESSMENT & PLAN:  Anemia due to stage 3 chronic kidney disease # IDA/ CKD-hemoglobin is today's 10.1. Improving.  Status post Aranesp last week.  Proceed with IV iron today.  #Acute drop in hemoglobin-to 6.5 from baseline 9-10 is likely secondary to ongoing chronic GI bleed./Positive Hemoccult.  EGD/Colonoscopy- on sep 3rd.   # proceed with IV venofer today; HOLD Aranesp.   # 1 week- H&H; aranesp; in 3weeks; H&H  aranesp/venofer/MD  CC: Dr.Lateef;   No orders of the defined types were placed in this encounter.  All questions were answered. The patient knows to call the clinic with any problems, questions or concerns.      Cammie Sickle, MD 10/26/2017 10:43 AM

## 2017-10-26 NOTE — Assessment & Plan Note (Addendum)
#   IDA/ CKD-hemoglobin is today's 10.1. Improving.  Status post Aranesp last week.  Proceed with IV iron today.  #Acute drop in hemoglobin-to 6.5 from baseline 9-10 is likely secondary to ongoing chronic GI bleed./Positive Hemoccult.  EGD/Colonoscopy- on sep 3rd.   # proceed with IV venofer today; HOLD Aranesp.   # 1 week- H&H; aranesp; in 3weeks; H&H aranesp/venofer/MD  CC: Dr.Lateef;

## 2017-11-02 ENCOUNTER — Inpatient Hospital Stay: Payer: Medicare HMO

## 2017-11-02 DIAGNOSIS — D631 Anemia in chronic kidney disease: Secondary | ICD-10-CM

## 2017-11-02 DIAGNOSIS — N183 Chronic kidney disease, stage 3 unspecified: Secondary | ICD-10-CM

## 2017-11-02 DIAGNOSIS — D500 Iron deficiency anemia secondary to blood loss (chronic): Secondary | ICD-10-CM | POA: Diagnosis not present

## 2017-11-02 LAB — SAMPLE TO BLOOD BANK

## 2017-11-02 LAB — HEMOGLOBIN: HEMOGLOBIN: 10.6 g/dL — AB (ref 12.0–16.0)

## 2017-11-02 LAB — HEMATOCRIT: HCT: 32.9 % — ABNORMAL LOW (ref 35.0–47.0)

## 2017-11-10 ENCOUNTER — Ambulatory Visit: Payer: Medicare HMO | Admitting: Anesthesiology

## 2017-11-10 ENCOUNTER — Encounter
Admission: RE | Disposition: A | Discharge status: Home / Self Care | Payer: Self-pay | Source: Ambulatory Visit | Attending: Internal Medicine

## 2017-11-10 ENCOUNTER — Ambulatory Visit
Admission: RE | Admit: 2017-11-10 | Discharge: 2017-11-10 | Disposition: A | Discharge status: Home / Self Care | Payer: Medicare HMO | Source: Ambulatory Visit | Attending: Internal Medicine | Admitting: Internal Medicine

## 2017-11-10 DIAGNOSIS — D509 Iron deficiency anemia, unspecified: Secondary | ICD-10-CM | POA: Diagnosis not present

## 2017-11-10 DIAGNOSIS — K5730 Diverticulosis of large intestine without perforation or abscess without bleeding: Secondary | ICD-10-CM | POA: Diagnosis not present

## 2017-11-10 DIAGNOSIS — K219 Gastro-esophageal reflux disease without esophagitis: Secondary | ICD-10-CM | POA: Diagnosis not present

## 2017-11-10 DIAGNOSIS — K208 Other esophagitis: Secondary | ICD-10-CM | POA: Diagnosis not present

## 2017-11-10 DIAGNOSIS — H409 Unspecified glaucoma: Secondary | ICD-10-CM | POA: Insufficient documentation

## 2017-11-10 DIAGNOSIS — Z7984 Long term (current) use of oral hypoglycemic drugs: Secondary | ICD-10-CM | POA: Insufficient documentation

## 2017-11-10 DIAGNOSIS — E1122 Type 2 diabetes mellitus with diabetic chronic kidney disease: Secondary | ICD-10-CM | POA: Diagnosis not present

## 2017-11-10 DIAGNOSIS — Z7982 Long term (current) use of aspirin: Secondary | ICD-10-CM | POA: Insufficient documentation

## 2017-11-10 DIAGNOSIS — N183 Chronic kidney disease, stage 3 (moderate): Secondary | ICD-10-CM | POA: Insufficient documentation

## 2017-11-10 DIAGNOSIS — K5790 Diverticulosis of intestine, part unspecified, without perforation or abscess without bleeding: Secondary | ICD-10-CM | POA: Diagnosis not present

## 2017-11-10 DIAGNOSIS — G4733 Obstructive sleep apnea (adult) (pediatric): Secondary | ICD-10-CM | POA: Diagnosis not present

## 2017-11-10 DIAGNOSIS — K640 First degree hemorrhoids: Secondary | ICD-10-CM | POA: Insufficient documentation

## 2017-11-10 DIAGNOSIS — Z9841 Cataract extraction status, right eye: Secondary | ICD-10-CM | POA: Diagnosis not present

## 2017-11-10 DIAGNOSIS — K922 Gastrointestinal hemorrhage, unspecified: Secondary | ICD-10-CM | POA: Diagnosis not present

## 2017-11-10 DIAGNOSIS — Z79899 Other long term (current) drug therapy: Secondary | ICD-10-CM | POA: Diagnosis not present

## 2017-11-10 DIAGNOSIS — Z888 Allergy status to other drugs, medicaments and biological substances status: Secondary | ICD-10-CM | POA: Diagnosis not present

## 2017-11-10 DIAGNOSIS — E1140 Type 2 diabetes mellitus with diabetic neuropathy, unspecified: Secondary | ICD-10-CM | POA: Diagnosis not present

## 2017-11-10 DIAGNOSIS — Z885 Allergy status to narcotic agent status: Secondary | ICD-10-CM | POA: Insufficient documentation

## 2017-11-10 DIAGNOSIS — Z9842 Cataract extraction status, left eye: Secondary | ICD-10-CM | POA: Insufficient documentation

## 2017-11-10 DIAGNOSIS — Z86010 Personal history of colonic polyps: Secondary | ICD-10-CM | POA: Diagnosis not present

## 2017-11-10 DIAGNOSIS — R195 Other fecal abnormalities: Secondary | ICD-10-CM | POA: Diagnosis not present

## 2017-11-10 DIAGNOSIS — E785 Hyperlipidemia, unspecified: Secondary | ICD-10-CM | POA: Diagnosis not present

## 2017-11-10 DIAGNOSIS — I129 Hypertensive chronic kidney disease with stage 1 through stage 4 chronic kidney disease, or unspecified chronic kidney disease: Secondary | ICD-10-CM | POA: Insufficient documentation

## 2017-11-10 DIAGNOSIS — K228 Other specified diseases of esophagus: Secondary | ICD-10-CM | POA: Diagnosis not present

## 2017-11-10 DIAGNOSIS — D631 Anemia in chronic kidney disease: Secondary | ICD-10-CM | POA: Diagnosis not present

## 2017-11-10 DIAGNOSIS — N2581 Secondary hyperparathyroidism of renal origin: Secondary | ICD-10-CM | POA: Insufficient documentation

## 2017-11-10 HISTORY — DX: Lymphedema, not elsewhere classified: I89.0

## 2017-11-10 HISTORY — DX: Myoneural disorder, unspecified: G70.9

## 2017-11-10 HISTORY — PX: COLONOSCOPY WITH PROPOFOL: SHX5780

## 2017-11-10 HISTORY — PX: ESOPHAGOGASTRODUODENOSCOPY: SHX5428

## 2017-11-10 SURGERY — EGD (ESOPHAGOGASTRODUODENOSCOPY)
Anesthesia: General

## 2017-11-10 MED ORDER — PROPOFOL 500 MG/50ML IV EMUL
INTRAVENOUS | Status: DC | PRN
  Administered 2017-11-10: 175 ug/kg/min via INTRAVENOUS

## 2017-11-10 MED ORDER — LIDOCAINE HCL (CARDIAC) PF 100 MG/5ML IV SOSY
PREFILLED_SYRINGE | INTRAVENOUS | Status: DC | PRN
  Administered 2017-11-10: 40 mg via INTRAVENOUS

## 2017-11-10 MED ORDER — SODIUM CHLORIDE 0.9 % IV SOLN
INTRAVENOUS | Status: DC
  Administered 2017-11-10: 09:00:00 via INTRAVENOUS

## 2017-11-10 MED ORDER — LIDOCAINE HCL (PF) 2 % IJ SOLN
INTRAMUSCULAR | Status: AC
  Filled 2017-11-10: qty 10

## 2017-11-10 MED ORDER — PROPOFOL 500 MG/50ML IV EMUL
INTRAVENOUS | Status: AC
  Filled 2017-11-10: qty 50

## 2017-11-10 MED ORDER — PROPOFOL 10 MG/ML IV BOLUS
INTRAVENOUS | Status: DC | PRN
  Administered 2017-11-10 (×2): 50 mg via INTRAVENOUS

## 2017-11-10 NOTE — Anesthesia Preprocedure Evaluation (Signed)
Anesthesia Evaluation  Patient identified by MRN, date of birth, ID band Patient awake    Reviewed: Allergy & Precautions, H&P , NPO status , Patient's Chart, lab work & pertinent test results  Airway Mallampati: II  TM Distance: >3 FB Neck ROM: full    Dental  (+) Upper Dentures, Lower Dentures   Pulmonary neg pulmonary ROS, shortness of breath (related to anemia per pt, improved after recent pRBC transfusion), sleep apnea ,    breath sounds clear to auscultation       Cardiovascular hypertension, (-) angina+ Peripheral Vascular Disease  (-) Past MI negative cardio ROS  (-) dysrhythmias  Rhythm:regular Rate:Normal     Neuro/Psych  Neuromuscular disease negative neurological ROS  negative psych ROS   GI/Hepatic negative GI ROS, Neg liver ROS, GERD  ,  Endo/Other  negative endocrine ROSdiabetes  Renal/GU CRFRenal diseasenegative Renal ROS  negative genitourinary   Musculoskeletal   Abdominal   Peds  Hematology negative hematology ROS (+) anemia ,   Anesthesia Other Findings Past Medical History: No date: Adenoma of colon No date: Adenomatous colon polyp No date: Anemia     Comment:  IDA No date: Cataract No date: Cervical stenosis of spinal canal No date: Chronic kidney disease     Comment:  stage 3 No date: Diabetes mellitus without complication (HCC) No date: GERD (gastroesophageal reflux disease) No date: Glaucoma No date: Heme positive stool No date: History of UTI No date: Hx of gallstones No date: Hyperkalemia No date: Hyperlipidemia No date: Hyperlipidemia No date: Hypertension No date: Lumbar stenosis No date: Lymphedema No date: Neuromuscular disorder (HCC) No date: Neuropathy associated with endocrine disorder (HCC) No date: Obesity No date: OSA on CPAP     Comment:  on CPAP No date: Osteoarthritis No date: Osteoarthritis No date: Retinopathy due to secondary diabetes ([place]) No date:  Secondary hyperparathyroidism of renal origin ([place])  Past Surgical History: No date: ACDF X2 No date: BACK SURGERY No date: Cataract extraction right No date: catarect extraction left No date: CHOLECYSTECTOMY 12/02/2016: COLONOSCOPY WITH PROPOFOL; N/A     Comment:  Procedure: COLONOSCOPY WITH PROPOFOL;  Surgeon: Manya Silvas, MD;  Location: [facility] ENDOSCOPY;  Service:               Endoscopy;  Laterality: N/A; No date: colonoscopy with removal lesions by snare No date: Endoscoic carpal tunnel release 12/02/2016: ESOPHAGOGASTRODUODENOSCOPY (EGD) WITH PROPOFOL; N/A     Comment:  Procedure: ESOPHAGOGASTRODUODENOSCOPY (EGD) WITH               PROPOFOL;  Surgeon: Manya Silvas, MD;  Location:               [facility] ENDOSCOPY;  Service: Endoscopy;  Laterality: N/A; 10/01/2017: IR IMAGING GUIDED PORT INSERTION No date: Laminectomy posterior lumbar facetectomy and formaninotomy w/ Decomp No date: Laminectony posterior cervicle decomp w/Facectomy and  foraminotomy No date: [place]; Left  BMI    Body Mass Index:  42.91 kg/m      Reproductive/Obstetrics negative OB ROS                             Anesthesia Physical Anesthesia Plan  ASA: III  Anesthesia Plan: General   Post-op Pain Management:    Induction:   PONV Risk Score and Plan: Propofol infusion and TIVA  Airway Management Planned: Natural Airway  and Nasal Cannula  Additional Equipment:   Intra-op Plan:   Post-operative Plan:   Informed Consent: I have reviewed the patients History and Physical, chart, labs and discussed the procedure including the risks, benefits and alternatives for the proposed anesthesia with the patient or authorized representative who has indicated his/her understanding and acceptance.   Dental Advisory Given  Plan Discussed with: Anesthesiologist, CRNA and Surgeon  Anesthesia Plan Comments:         Anesthesia Quick  Evaluation

## 2017-11-10 NOTE — Anesthesia Post-op Follow-up Note (Signed)
Anesthesia QCDR form completed.        

## 2017-11-10 NOTE — Transfer of Care (Signed)
Immediate Anesthesia Transfer of Care Note  Patient: Sara Bright  Procedure(s) Performed: ESOPHAGOGASTRODUODENOSCOPY (EGD) (N/A ) COLONOSCOPY WITH PROPOFOL (N/A )  Patient Location: PACU and Endoscopy Unit  Anesthesia Type:General  Level of Consciousness: awake  Airway & Oxygen Therapy: Patient Spontanous Breathing  Post-op Assessment: Report given to RN  Post vital signs: stable  Last Vitals:  Vitals Value Taken Time  BP    Temp    Pulse 67 11/10/2017 10:29 AM  Resp 20 11/10/2017 10:29 AM  SpO2 100 % 11/10/2017 10:29 AM  Vitals shown include unvalidated device data.  Last Pain:  Vitals:   11/10/17 0856  TempSrc: Tympanic  PainSc: 0-No pain         Complications: No apparent anesthesia complications

## 2017-11-10 NOTE — Interval H&P Note (Signed)
History and Physical Interval Note:  11/10/2017 9:00 AM  Sara Bright  has presented today for surgery, with the diagnosis of IRON DEF.ANEMIA  The various methods of treatment have been discussed with the patient and family. After consideration of risks, benefits and other options for treatment, the patient has consented to  Procedure(s): ESOPHAGOGASTRODUODENOSCOPY (EGD) (N/A) COLONOSCOPY WITH PROPOFOL (N/A) as a surgical intervention .  The patient's history has been reviewed, patient examined, no change in status, stable for surgery.  I have reviewed the patient's chart and labs.  Questions were answered to the patient's satisfaction.     [place], [place]

## 2017-11-10 NOTE — Op Note (Signed)
[facility] Gastroenterology Patient Name: Sara Bright Procedure Date: 11/10/2017 9:37 AM MRN: [id number] Account #: [id number] Date of Birth: 1940-06-18 Admit Type: Outpatient Age: 77 Room: [facility] ENDO ROOM 1 Gender: Female Note Status: Finalized Procedure:            Upper GI endoscopy Indications:          Suspected upper gastrointestinal bleeding in patient                        with unexplained iron deficiency anemia, Heme positive                        stool Providers:            Benay Pike. Alice Reichert MD, MD Referring MD:         Juluis Rainier (Referring MD) Medicines:            Propofol per Anesthesia Complications:        No immediate complications. Procedure:            Pre-Anesthesia Assessment:                       - The risks and benefits of the procedure and the                        sedation options and risks were discussed with the                        patient. All questions were answered and informed                        consent was obtained.                       - Patient identification and proposed procedure were                        verified prior to the procedure by the nurse. The                        procedure was verified in the procedure room.                       - ASA Grade Assessment: III - A patient with severe                        systemic disease.                       - After reviewing the risks and benefits, the patient                        was deemed in satisfactory condition to undergo the                        procedure.                       After obtaining informed consent, the endoscope was  passed under direct vision. Throughout the procedure,                        the patient's blood pressure, pulse, and oxygen                        saturations were monitored continuously. The Endoscope                        was introduced through the mouth, and advanced to the    third part of duodenum. The upper GI endoscopy was                        accomplished without difficulty. The patient tolerated                        the procedure well. Findings:      The Z-line was irregular and was found at the gastroesophageal junction.       Mucosa was biopsied with a cold forceps for histology. One specimen       bottle was sent to pathology.      The entire examined stomach was normal.      The examined duodenum was normal.      The exam was otherwise without abnormality. Impression:           - Z-line irregular, at the gastroesophageal junction.                        Biopsied.                       - Normal stomach.                       - Normal examined duodenum.                       - The examination was otherwise normal. Recommendation:       - Await pathology results.                       - Proceed with colonoscopy Procedure Code(s):    --- Professional ---                       [phone], Esophagogastroduodenoscopy, flexible, transoral;                        with biopsy, single or multiple Diagnosis Code(s):    --- Professional ---                       R19.5, Other fecal abnormalities                       D50.9, Iron deficiency anemia, unspecified                       K22.8, Other specified diseases of esophagus CPT copyright 2017 American Medical Association. All rights reserved. The codes documented in this report are preliminary and upon coder review may  be revised to meet current compliance requirements. Efrain Sella MD, MD 11/10/2017 10:12:56 AM This report has been signed electronically. Number of Addenda: 0 Note  Initiated On: 11/10/2017 9:37 AM      [facility]

## 2017-11-10 NOTE — Op Note (Addendum)
[facility] Gastroenterology Patient Name: Sara Bright Procedure Date: 11/10/2017 9:37 AM MRN: [id number] Account #: [id number] Date of Birth: [birth year] Admit Type: Outpatient Age: 77 Room: [facility] ENDO ROOM 1 Gender: Female Note Status: Finalized Procedure:            Colonoscopy Indications:          Evaluation of unexplained GI bleeding, Heme positive                        stool Providers:            Benay Pike. Alice Reichert MD, MD Referring MD:         Juluis Rainier (Referring MD) Medicines:            Propofol per Anesthesia Complications:        No immediate complications. Procedure:            Pre-Anesthesia Assessment:                       - The risks and benefits of the procedure and the                        sedation options and risks were discussed with the                        patient. All questions were answered and informed                        consent was obtained.                       - Patient identification and proposed procedure were                        verified prior to the procedure by the nurse. The                        procedure was verified in the procedure room.                       - ASA Grade Assessment: III - A patient with severe                        systemic disease.                       - After reviewing the risks and benefits, the patient                        was deemed in satisfactory condition to undergo the                        procedure.                       After obtaining informed consent, the colonoscope was                        passed under direct vision. Throughout the procedure,                        the  patient's blood pressure, pulse, and oxygen                        saturations were monitored continuously. The                        Colonoscope was introduced through the anus and                        advanced to the the cecum, identified by appendiceal                        orifice and ileocecal  valve. The colonoscopy was                        performed without difficulty. The patient tolerated the                        procedure well. The quality of the bowel preparation                        was excellent. The ileocecal valve, appendiceal                        orifice, and rectum were photographed. Findings:      The perianal and digital rectal examinations were normal. Pertinent       negatives include normal sphincter tone and no palpable rectal lesions.      A few small-mouthed diverticula were found in the sigmoid colon.      Non-bleeding internal hemorrhoids were found during retroflexion. The       hemorrhoids were Grade I (internal hemorrhoids that do not prolapse).      The exam was otherwise without abnormality. Impression:           - Diverticulosis in the sigmoid colon.                       - Non-bleeding internal hemorrhoids.                       - The examination was otherwise normal.                       - No specimens collected. Recommendation:       - Patient has a contact number available for                        emergencies. The signs and symptoms of potential                        delayed complications were discussed with the patient.                        Return to normal activities tomorrow. Written discharge                        instructions were provided to the patient.                       - Await pathology results from EGD, also performed  today.                       - Resume previous diet.                       - Continue present medications.                       - No repeat colonoscopy due to age and the absence of                        advanced adenomas.                       - Return to GI office PRN.                       - Consider other causes of iron deficiency anemia                       - The findings and recommendations were discussed with                        the patient and their  family. Procedure Code(s):    --- Professional ---                       [phone], Colonoscopy, flexible; diagnostic, including                        collection of specimen(s) by brushing or washing, when                        performed (separate procedure) Diagnosis Code(s):    --- Professional ---                       K57.30, Diverticulosis of large intestine without                        perforation or abscess without bleeding                       R19.5, Other fecal abnormalities                       K92.2, Gastrointestinal hemorrhage, unspecified                       K64.0, First degree hemorrhoids CPT copyright 2017 American Medical Association. All rights reserved. The codes documented in this report are preliminary and upon coder review may  be revised to meet current compliance requirements. Efrain Sella MD, MD 11/10/2017 10:26:49 AM This report has been signed electronically. Number of Addenda: 0 Note Initiated On: 11/10/2017 9:37 AM Scope Withdrawal Time: 0 hours 6 minutes 14 seconds  Total Procedure Duration: 0 hours 9 minutes 1 second       [facility]

## 2017-11-10 NOTE — H&P (Signed)
Outpatient short stay form Pre-procedure 11/10/2017 8:57 AM Teodoro K. Alice Reichert, M.D.  Primary Physician: Juluis Rainier, NP  Reason for visit:  Iron deficiency anemia,heme positive stool,  Personal hx of colon polyps.  History of present illness: Patient with CKD Stage III presents for endoluminal evaluation for iron deficiency anemia in addition to chronic anemia.  The patient has mild constipation related to iron supplementation but otherwise denies any upper or lower GI symptoms.  Last colonoscopy was December 02, 2016 revealed no polyps.  She presents today for work-up of heme positive stool.  No current facility-administered medications for this encounter.   Medications Prior to Admission  Medication Sig Dispense Refill Last Dose  . amLODipine (NORVASC) 5 MG tablet Take 5 mg by mouth daily.    11/10/2017 at 0600  . carvedilol (COREG) 6.25 MG tablet Take 6.25 mg by mouth 2 (two) times daily.  12 11/10/2017 at 0600  . ferrous sulfate 325 (65 FE) MG tablet Take 325 mg by mouth daily.   Past Month at Unknown time  . latanoprost (XALATAN) 0.005 % ophthalmic solution Place 1 drop into both eyes.    11/09/2017 at 0600  . losartan (COZAAR) 50 MG tablet Take 100 mg by mouth daily.    11/10/2017 at 0600  . timolol (TIMOPTIC-XR) 0.5 % ophthalmic gel-forming Place 1 drop into both eyes.    11/09/2017 at 0600  . acetaminophen (TYLENOL) 500 MG tablet Take 500 mg by mouth every 6 (six) hours as needed.    11/07/2017  . aspirin EC 81 MG tablet Take 81 mg by mouth daily.   Taking  . ASSURE COMFORT LANCETS 30G MISC    Taking  . calcitRIOL (ROCALTROL) 0.25 MCG capsule Take 0.25 mcg by mouth 3 (three) times a week.    Taking  . furosemide (LASIX) 40 MG tablet Take 40 mg by mouth daily.    11/08/2017  . gabapentin (NEURONTIN) 100 MG capsule Take 100 mg by mouth 2 (two) times daily.    11/08/2017  . gemfibrozil (LOPID) 600 MG tablet Take 600 mg by mouth daily.    Taking  . glimepiride (AMARYL) 1 MG tablet Take 1 mg by  mouth daily with breakfast.    Taking  . Lancet Devices (ADJUSTABLE LANCING DEVICE) MISC    Taking  . lidocaine-prilocaine (EMLA) cream Apply 1 application topically as needed. 30 g 3 Taking  . Multiple Vitamins-Minerals (CENTRUM SILVER) tablet Take 1 tablet by mouth daily.    Taking  . Omega-3 Fatty Acids (FISH OIL) 1000 MG CAPS Take 1,000 mg by mouth daily.    11/08/2017  . omeprazole (PRILOSEC) 20 MG capsule Take 20 mg by mouth daily.    11/08/2017  . ONE TOUCH ULTRA TEST test strip    Taking  . pioglitazone (ACTOS) 30 MG tablet Take 30 mg by mouth daily.    Taking     Allergies  Allergen Reactions  . Hydrocodone-Acetaminophen Other (See Comments)    Sedation and GI upset, pt is not allergic to acetaminophen  . Statins Other (See Comments)     Past Medical History:  Diagnosis Date  . Adenoma of colon   . Adenomatous colon polyp   . Anemia    IDA  . Cataract   . Cervical stenosis of spinal canal   . Chronic kidney disease    stage 3  . Diabetes mellitus without complication ([place])   . GERD (gastroesophageal reflux disease)   . Glaucoma   . Heme positive  stool   . History of UTI   . Hx of gallstones   . Hyperkalemia   . Hyperlipidemia   . Hyperlipidemia   . Hypertension   . Lumbar stenosis   . Lymphedema   . Neuromuscular disorder ([place])   . Neuropathy associated with endocrine disorder ([place])   . Obesity   . OSA on CPAP    on CPAP  . Osteoarthritis   . Osteoarthritis   . Retinopathy due to secondary diabetes ([place])   . Secondary hyperparathyroidism of renal origin [facility])     Review of systems:  Otherwise negative.    Physical Exam  Gen: Alert, oriented. Appears stated age.  HEENT: [place]/AT. PERRLA. Lungs: CTA, no wheezes. CV: RR nl S1, S2. Abd: soft, benign, no masses. BS+ Ext: No edema. Pulses 2+    Planned procedures: Proceed with EGD and colonoscopy. The patient understands the nature of the planned procedure, indications, risks, alternatives and potential  complications including but not limited to bleeding, infection, perforation, damage to internal organs and possible oversedation/side effects from anesthesia. The patient agrees and gives consent to proceed.  Please refer to procedure notes for findings, recommendations and patient disposition/instructions.     Teodoro K. Alice Reichert, M.D. Gastroenterology 11/10/2017  8:57 AM

## 2017-11-11 ENCOUNTER — Encounter: Payer: Self-pay | Admitting: Internal Medicine

## 2017-11-11 NOTE — Anesthesia Postprocedure Evaluation (Signed)
Anesthesia Post Note  Patient: Sara Bright  Procedure(s) Performed: ESOPHAGOGASTRODUODENOSCOPY (EGD) (N/A ) COLONOSCOPY WITH PROPOFOL (N/A )  Patient location during evaluation: PACU Anesthesia Type: General Level of consciousness: awake and alert Pain management: pain level controlled Vital Signs Assessment: post-procedure vital signs reviewed and stable Respiratory status: spontaneous breathing, nonlabored ventilation and respiratory function stable Cardiovascular status: blood pressure returned to baseline and stable Postop Assessment: no apparent nausea or vomiting Anesthetic complications: no     Last Vitals:  Vitals:   11/10/17 1109 11/10/17 1119  BP: (!) 141/69 135/68  Pulse: 60 (!) 58  Resp: (!) 26 (!) 25  Temp:    SpO2: 100% 100%    Last Pain:  Vitals:   11/10/17 1119  TempSrc:   PainSc: 0-No pain                 Durenda Hurt

## 2017-11-13 LAB — SURGICAL PATHOLOGY

## 2017-11-15 DIAGNOSIS — H401111 Primary open-angle glaucoma, right eye, mild stage: Secondary | ICD-10-CM | POA: Diagnosis not present

## 2017-11-16 ENCOUNTER — Inpatient Hospital Stay: Payer: Medicare HMO

## 2017-11-16 ENCOUNTER — Inpatient Hospital Stay: Payer: Medicare HMO | Attending: Internal Medicine | Admitting: Internal Medicine

## 2017-11-16 ENCOUNTER — Encounter: Payer: Self-pay | Admitting: Internal Medicine

## 2017-11-16 VITALS — BP 172/71 | HR 74 | Temp 97.4°F | Resp 16 | Wt 255.8 lb

## 2017-11-16 VITALS — BP 147/82 | HR 52 | Resp 18

## 2017-11-16 DIAGNOSIS — D631 Anemia in chronic kidney disease: Secondary | ICD-10-CM | POA: Diagnosis not present

## 2017-11-16 DIAGNOSIS — E1122 Type 2 diabetes mellitus with diabetic chronic kidney disease: Secondary | ICD-10-CM | POA: Diagnosis not present

## 2017-11-16 DIAGNOSIS — Z7982 Long term (current) use of aspirin: Secondary | ICD-10-CM

## 2017-11-16 DIAGNOSIS — N183 Chronic kidney disease, stage 3 unspecified: Secondary | ICD-10-CM

## 2017-11-16 DIAGNOSIS — E669 Obesity, unspecified: Secondary | ICD-10-CM | POA: Diagnosis not present

## 2017-11-16 DIAGNOSIS — I129 Hypertensive chronic kidney disease with stage 1 through stage 4 chronic kidney disease, or unspecified chronic kidney disease: Secondary | ICD-10-CM | POA: Diagnosis not present

## 2017-11-16 DIAGNOSIS — E1140 Type 2 diabetes mellitus with diabetic neuropathy, unspecified: Secondary | ICD-10-CM | POA: Diagnosis not present

## 2017-11-16 DIAGNOSIS — Z7984 Long term (current) use of oral hypoglycemic drugs: Secondary | ICD-10-CM | POA: Diagnosis not present

## 2017-11-16 DIAGNOSIS — D500 Iron deficiency anemia secondary to blood loss (chronic): Secondary | ICD-10-CM | POA: Diagnosis not present

## 2017-11-16 DIAGNOSIS — Z79899 Other long term (current) drug therapy: Secondary | ICD-10-CM

## 2017-11-16 LAB — SAMPLE TO BLOOD BANK

## 2017-11-16 LAB — HEMATOCRIT: HCT: 33.4 % — ABNORMAL LOW (ref 35.0–47.0)

## 2017-11-16 LAB — HEMOGLOBIN: Hemoglobin: 10.6 g/dL — ABNORMAL LOW (ref 12.0–16.0)

## 2017-11-16 MED ORDER — SODIUM CHLORIDE 0.9 % IV SOLN: 200.0000 mg | Freq: Once | INTRAVENOUS | Status: DC

## 2017-11-16 MED ORDER — IRON SUCROSE 20 MG/ML IV SOLN
200.0000 mg | Freq: Once | INTRAVENOUS | Status: AC
  Administered 2017-11-16: 200 mg via INTRAVENOUS
  Filled 2017-11-16: qty 10

## 2017-11-16 MED ORDER — SODIUM CHLORIDE 0.9 % IV SOLN
Freq: Once | INTRAVENOUS | Status: AC
  Administered 2017-11-16: 10:00:00 via INTRAVENOUS
  Filled 2017-11-16: qty 250

## 2017-11-16 MED ORDER — HEPARIN SOD (PORK) LOCK FLUSH 100 UNIT/ML IV SOLN
INTRAVENOUS | Status: AC
  Filled 2017-11-16: qty 5

## 2017-11-16 MED ORDER — HEPARIN SOD (PORK) LOCK FLUSH 100 UNIT/ML IV SOLN
500.0000 [IU] | Freq: Once | INTRAVENOUS | Status: AC
  Administered 2017-11-16: 500 [IU] via INTRAVENOUS

## 2017-11-16 NOTE — Progress Notes (Signed)
[facility] OFFICE PROGRESS NOTE  Patient Care Team: Gauger, Victoriano Lain, NP as PCP - General (Internal Medicine)  Cancer Staging No matching staging information was found for the patient.  # IRON DEF ANEMIA/ CKD [Dr.Lateef; Dr.Gittin] sep 2017- Stool/UA/Myeloma work up-NEG;colo- 2010 [per pt] Sep 2017- IV venofer; June 2018-  Aranesp; July 2019-stool occult test positive; [id number]- EGD/colo [Dr.Toledo-NEG]  # CKD-III [1.7]; Dr.Lateef   No history exists.    INTERVAL HISTORY:  Sara Bright 77 y.o.  female pleasant patient above history of Chronic anemia/chronic kidney disease; is here for follow-up.  Patient had a recent EGD colonoscopy.  Procedure was uneventful.  Shortness Of breath is improved.  No constipation.  No swelling in the legs.  Feels fair.  Review of Systems  Constitutional: Negative for chills, diaphoresis, fever, malaise/fatigue and weight loss.  HENT: Negative for nosebleeds and sore throat.   Eyes: Negative for double vision.  Respiratory: Negative for cough, hemoptysis, sputum production, shortness of breath and wheezing.   Cardiovascular: Negative for chest pain, palpitations, orthopnea and leg swelling.  Gastrointestinal: Negative for abdominal pain, blood in stool, constipation, diarrhea, heartburn, melena, nausea and vomiting.  Genitourinary: Negative for dysuria, frequency and urgency.  Musculoskeletal: Negative for back pain and joint pain.  Skin: Negative.  Negative for itching and rash.  Neurological: Negative for dizziness, tingling, focal weakness, weakness and headaches.  Endo/Heme/Allergies: Does not bruise/bleed easily.  Psychiatric/Behavioral: Negative for depression. The patient is not nervous/anxious and does not have insomnia.      PAST MEDICAL HISTORY :  Past Medical History:  Diagnosis Date  . Adenoma of colon   . Adenomatous colon polyp   . Anemia    IDA  . Cataract   . Cervical stenosis of spinal canal   .  Chronic kidney disease    stage 3  . Diabetes mellitus without complication ([place])   . GERD (gastroesophageal reflux disease)   . Glaucoma   . Heme positive stool   . History of UTI   . Hx of gallstones   . Hyperkalemia   . Hyperlipidemia   . Hyperlipidemia   . Hypertension   . Lumbar stenosis   . Lymphedema   . Neuromuscular disorder ([place])   . Neuropathy associated with endocrine disorder ([place])   . Obesity   . OSA on CPAP    on CPAP  . Osteoarthritis   . Osteoarthritis   . Retinopathy due to secondary diabetes ([place])   . Secondary hyperparathyroidism of renal origin [facility])     PAST SURGICAL HISTORY :   Past Surgical History:  Procedure Laterality Date  . ACDF X2    . BACK SURGERY    . Cataract extraction right    . catarect extraction left    . CHOLECYSTECTOMY    . COLONOSCOPY WITH PROPOFOL N/A 12/02/2016   Procedure: COLONOSCOPY WITH PROPOFOL;  Surgeon: Manya Silvas, MD;  Location: [facility] ENDOSCOPY;  Service: Endoscopy;  Laterality: N/A;  . COLONOSCOPY WITH PROPOFOL N/A 11/10/2017   Procedure: COLONOSCOPY WITH PROPOFOL;  Surgeon: Toledo, Benay Pike, MD;  Location: [facility] ENDOSCOPY;  Service: Gastroenterology;  Laterality: N/A;  . colonoscopy with removal lesions by snare    . Endoscoic carpal tunnel release    . ESOPHAGOGASTRODUODENOSCOPY N/A 11/10/2017   Procedure: ESOPHAGOGASTRODUODENOSCOPY (EGD);  Surgeon: Toledo, Benay Pike, MD;  Location: [facility] ENDOSCOPY;  Service: Gastroenterology;  Laterality: N/A;  . ESOPHAGOGASTRODUODENOSCOPY (EGD) WITH PROPOFOL N/A 12/02/2016   Procedure: ESOPHAGOGASTRODUODENOSCOPY (EGD) WITH PROPOFOL;  Surgeon:  Manya Silvas, MD;  Location: [facility] ENDOSCOPY;  Service: Endoscopy;  Laterality: N/A;  . IR IMAGING GUIDED PORT INSERTION  10/01/2017  . Laminectomy posterior lumbar facetectomy and formaninotomy w/Decomp    . Laminectony posterior cervicle decomp w/Facectomy and foraminotomy    . VEIN LIGATION AND STRIPPING Left     FAMILY HISTORY :    Family History  Problem Relation Age of Onset  . Coronary artery disease Mother   . Diabetes type II Mother   . Hypertension Mother   . Tuberculosis Father   . Stroke Father   . Diabetes type II Sister   . Migraines Sister   . Alcohol abuse Brother   . Coronary artery disease Brother   . Diabetes type II Brother   . Kidney cancer Brother   . Kidney disease Brother   . Heart attack Brother   . Hypertension Brother     SOCIAL HISTORY:   Social History   Tobacco Use  . Smoking status: Never Smoker  . Smokeless tobacco: Never Used  Substance Use Topics  . Alcohol use: No  . Drug use: No    ALLERGIES:  is allergic to hydrocodone-acetaminophen and statins.  MEDICATIONS:  Current Outpatient Medications  Medication Sig Dispense Refill  . acetaminophen (TYLENOL) 500 MG tablet Take 500 mg by mouth every 6 (six) hours as needed.     Marland Kitchen amLODipine (NORVASC) 5 MG tablet Take 5 mg by mouth daily.     Marland Kitchen aspirin EC 81 MG tablet Take 81 mg by mouth daily.    . ASSURE COMFORT LANCETS 30G MISC     . calcitRIOL (ROCALTROL) 0.25 MCG capsule Take 0.25 mcg by mouth 3 (three) times a week.     . carvedilol (COREG) 6.25 MG tablet Take 6.25 mg by mouth 2 (two) times daily.  12  . ferrous sulfate 325 (65 FE) MG tablet Take 325 mg by mouth daily.    . furosemide (LASIX) 40 MG tablet Take 40 mg by mouth daily.     Marland Kitchen gabapentin (NEURONTIN) 100 MG capsule Take 100 mg by mouth 2 (two) times daily.     Marland Kitchen gemfibrozil (LOPID) 600 MG tablet Take 600 mg by mouth daily.     Marland Kitchen glimepiride (AMARYL) 1 MG tablet Take 1 mg by mouth daily with breakfast.     . Lancet Devices (ADJUSTABLE LANCING DEVICE) MISC     . latanoprost (XALATAN) 0.005 % ophthalmic solution Place 1 drop into both eyes.     Marland Kitchen lidocaine-prilocaine (EMLA) cream Apply 1 application topically as needed. 30 g 3  . losartan (COZAAR) 50 MG tablet Take 100 mg by mouth daily.     . Multiple Vitamins-Minerals (CENTRUM SILVER) tablet Take 1 tablet by  mouth daily.     . Omega-3 Fatty Acids (FISH OIL) 1000 MG CAPS Take 1,000 mg by mouth daily.     Marland Kitchen omeprazole (PRILOSEC) 20 MG capsule Take 20 mg by mouth daily.     . ONE TOUCH ULTRA TEST test strip     . pioglitazone (ACTOS) 30 MG tablet Take 30 mg by mouth daily.     . timolol (TIMOPTIC-XR) 0.5 % ophthalmic gel-forming Place 1 drop into both eyes.      No current facility-administered medications for this visit.     PHYSICAL EXAMINATION:  BP (!) 172/71 (BP Location: Left Arm, Patient Position: Sitting)   Pulse 74   Temp (!) 97.4 F (36.3 C) (Tympanic)   Resp 16  Wt 255 lb 13.5 oz (116 kg)   BMI 43.92 kg/m   Filed Weights   11/16/17 0919  Weight: 255 lb 13.5 oz (116 kg)    Physical Exam  Constitutional: She is oriented to person, place, and time.  Obese.  Walks with a cane.  Alone.  HENT:  Head: Normocephalic and atraumatic.  Mouth/Throat: Oropharynx is clear and moist. No oropharyngeal exudate.  Eyes: Pupils are equal, round, and reactive to light.  Neck: Normal range of motion. Neck supple.  Cardiovascular: Normal rate and regular rhythm.  Pulmonary/Chest: No respiratory distress. She has no wheezes.  Abdominal: Soft. Bowel sounds are normal. She exhibits no distension and no mass. There is no tenderness. There is no rebound and no guarding.  Musculoskeletal: Normal range of motion. She exhibits no edema or tenderness.  Neurological: She is alert and oriented to person, place, and time.  Skin: Skin is warm.  Psychiatric: Affect normal.    LABORATORY DATA:  I have reviewed the data as listed    Component Value Date/Time   NA 141 10/01/2017 0810   NA 137 10/23/2012 2335   K 4.8 10/01/2017 0810   K 4.4 10/23/2012 2335   CL 110 10/01/2017 0810   CL 107 10/23/2012 2335   CO2 25 10/01/2017 0810   CO2 26 10/23/2012 2335   GLUCOSE 93 10/01/2017 0810   GLUCOSE 179 (H) 10/23/2012 2335   BUN 58 (H) 10/01/2017 0810   BUN 21 (H) 10/23/2012 2335   CREATININE 2.01 (H)  10/01/2017 0810   CREATININE 1.23 10/23/2012 2335   CALCIUM 9.7 10/01/2017 0810   CALCIUM 10.3 (H) 10/23/2012 2335   PROT 6.4 (L) 09/22/2017 1031   PROT 7.4 06/16/2011 1144   ALBUMIN 3.3 (L) 09/22/2017 1031   ALBUMIN 3.5 06/16/2011 1144   AST 20 09/22/2017 1031   AST 22 08/18/2011 1026   ALT 17 09/22/2017 1031   ALT 63 06/16/2011 1144   ALKPHOS 60 09/22/2017 1031   ALKPHOS 98 06/16/2011 1144   BILITOT 0.7 09/22/2017 1031   BILITOT 0.4 06/16/2011 1144   GFRNONAA 23 (L) 10/01/2017 0810   GFRNONAA 43 (L) 10/23/2012 2335   GFRAA 26 (L) 10/01/2017 0810   GFRAA 50 (L) 10/23/2012 2335    No results found for: SPEP, UPEP  Lab Results  Component Value Date   WBC 3.7 10/06/2017   NEUTROABS 2.1 10/06/2017   HGB 10.6 (L) 11/16/2017   HCT 33.4 (L) 11/16/2017   MCV 103.3 (H) 10/06/2017   PLT 186 10/06/2017      Chemistry      Component Value Date/Time   NA 141 10/01/2017 0810   NA 137 10/23/2012 2335   K 4.8 10/01/2017 0810   K 4.4 10/23/2012 2335   CL 110 10/01/2017 0810   CL 107 10/23/2012 2335   CO2 25 10/01/2017 0810   CO2 26 10/23/2012 2335   BUN 58 (H) 10/01/2017 0810   BUN 21 (H) 10/23/2012 2335   CREATININE 2.01 (H) 10/01/2017 0810   CREATININE 1.23 10/23/2012 2335      Component Value Date/Time   CALCIUM 9.7 10/01/2017 0810   CALCIUM 10.3 (H) 10/23/2012 2335   ALKPHOS 60 09/22/2017 1031   ALKPHOS 98 06/16/2011 1144   AST 20 09/22/2017 1031   AST 22 08/18/2011 1026   ALT 17 09/22/2017 1031   ALT 63 06/16/2011 1144   BILITOT 0.7 09/22/2017 1031   BILITOT 0.4 06/16/2011 1144     Results for Sara Bright, Sara Bright (  MRN [id number]) as of 02/18/2017 10:08  Ref. Range 05/12/2016 08:53 09/01/2016 13:52 11/19/2016 08:42 12/22/2016 10:29 01/19/2017 10:20  Iron Latest Ref Range: 28 - 170 ug/dL 75 67 62 79 54  UIBC Latest Units: ug/dL 259 266 280 236 250  TIBC Latest Ref Range: 250 - 450 ug/dL 334 333 342 315 304  Saturation Ratios Latest Ref Range: 10.4 - 31.8 % 23 20 18 25  18   Ferritin Latest Ref Range: 11 - 307 ng/mL 651 (H) 467 (H) 413 (H) 633 (H) 374 (H)    RADIOGRAPHIC STUDIES: I have personally reviewed the radiological images as listed and agreed with the findings in the report. No results found.   ASSESSMENT & PLAN:  Anemia due to stage 3 chronic kidney disease # IDA/ CKD-hemoglobin is today's 10.6. STABLE.  Proceed with IV iron today.  Restart p.o. iron.  # ? chronic GI bleed./Positive Hemoccult. Sep 2019-  EGD/Colonoscopy- NEGATIVE; capsule nov 2018- NEGATIVE.  Monitor for now.   # Venofer today; follow up H&H in 3 weeks/ possible venofer/ aranesp;   CC: Dr.Lateef;   Orders Placed This Encounter  Procedures  . Hematocrit    Standing Status:   Future    Standing Expiration Date:   11/17/2018  . Hemoglobin    Standing Status:   Future    Standing Expiration Date:   11/17/2018   All questions were answered. The patient knows to call the clinic with any problems, questions or concerns.      Cammie Sickle, MD 11/16/2017 10:44 AM

## 2017-11-16 NOTE — Assessment & Plan Note (Addendum)
#   IDA/ CKD-hemoglobin is today's 10.6. STABLE.  Proceed with IV iron today.  Restart p.o. iron.  # ? chronic GI bleed./Positive Hemoccult. Sep 2019-  EGD/Colonoscopy- NEGATIVE; capsule nov 2018- NEGATIVE.  Monitor for now.   # Venofer today; follow up H&H in 3 weeks/ possible venofer/ aranesp;   CC: Dr.Lateef;

## 2017-11-17 DIAGNOSIS — E119 Type 2 diabetes mellitus without complications: Secondary | ICD-10-CM | POA: Diagnosis not present

## 2017-11-20 DIAGNOSIS — G4733 Obstructive sleep apnea (adult) (pediatric): Secondary | ICD-10-CM | POA: Diagnosis not present

## 2017-11-22 ENCOUNTER — Emergency Department: Payer: Medicare HMO

## 2017-11-22 ENCOUNTER — Emergency Department
Admission: EM | Admit: 2017-11-22 | Discharge: 2017-11-22 | Disposition: A | Discharge status: Home / Self Care | Payer: Medicare HMO | Attending: Emergency Medicine | Admitting: Emergency Medicine

## 2017-11-22 ENCOUNTER — Other Ambulatory Visit: Payer: Self-pay

## 2017-11-22 DIAGNOSIS — Z7982 Long term (current) use of aspirin: Secondary | ICD-10-CM | POA: Insufficient documentation

## 2017-11-22 DIAGNOSIS — I129 Hypertensive chronic kidney disease with stage 1 through stage 4 chronic kidney disease, or unspecified chronic kidney disease: Secondary | ICD-10-CM | POA: Diagnosis not present

## 2017-11-22 DIAGNOSIS — E11319 Type 2 diabetes mellitus with unspecified diabetic retinopathy without macular edema: Secondary | ICD-10-CM | POA: Insufficient documentation

## 2017-11-22 DIAGNOSIS — Z79899 Other long term (current) drug therapy: Secondary | ICD-10-CM | POA: Diagnosis not present

## 2017-11-22 DIAGNOSIS — M5442 Lumbago with sciatica, left side: Secondary | ICD-10-CM

## 2017-11-22 DIAGNOSIS — M5416 Radiculopathy, lumbar region: Secondary | ICD-10-CM | POA: Diagnosis not present

## 2017-11-22 DIAGNOSIS — N183 Chronic kidney disease, stage 3 (moderate): Secondary | ICD-10-CM | POA: Diagnosis not present

## 2017-11-22 DIAGNOSIS — E1122 Type 2 diabetes mellitus with diabetic chronic kidney disease: Secondary | ICD-10-CM | POA: Diagnosis not present

## 2017-11-22 DIAGNOSIS — M545 Low back pain: Secondary | ICD-10-CM | POA: Diagnosis present

## 2017-11-22 MED ORDER — TRAMADOL HCL 50 MG PO TABS: 50.0000 mg | ORAL_TABLET | Freq: Four times a day (QID) | ORAL | 0 refills | Status: DC | PRN

## 2017-11-22 MED ORDER — LIDOCAINE 5 % EX PTCH: 1.0000 | MEDICATED_PATCH | CUTANEOUS | 0 refills | Status: DC

## 2017-11-22 MED ORDER — BACLOFEN 10 MG PO TABS: 10.0000 mg | ORAL_TABLET | Freq: Three times a day (TID) | ORAL | 1 refills | Status: DC

## 2017-11-22 MED ORDER — ONDANSETRON 4 MG PO TBDP
4.0000 mg | ORAL_TABLET | Freq: Once | ORAL | Status: AC
  Administered 2017-11-22: 4 mg via ORAL
  Filled 2017-11-22: qty 1

## 2017-11-22 MED ORDER — CYCLOBENZAPRINE HCL 10 MG PO TABS
10.0000 mg | ORAL_TABLET | Freq: Once | ORAL | Status: AC
  Administered 2017-11-22: 10 mg via ORAL
  Filled 2017-11-22: qty 1

## 2017-11-22 MED ORDER — OXYCODONE-ACETAMINOPHEN 5-325 MG PO TABS
1.0000 | ORAL_TABLET | Freq: Once | ORAL | Status: AC
  Administered 2017-11-22: 1 via ORAL
  Filled 2017-11-22: qty 1

## 2017-11-22 NOTE — Discharge Instructions (Addendum)
Follow-up with your regular doctor or surgeon for evaluation of the L3-L4 disc.  Or you may follow-up with [facility] clinic orthopedics to be evaluated.  Take the medication as prescribed.  If the medication makes you drowsy please be very careful upon standing and walking so as not to fall.  Return to the emergency department if you are worsening

## 2017-11-22 NOTE — ED Provider Notes (Signed)
[facility] Emergency Department Provider Note  ____________________________________________   First MD Initiated Contact with Patient 11/22/17 1146     (approximate)  I have reviewed the triage vital signs and the nursing notes.   HISTORY  Chief Complaint Back Pain    HPI Sara Bright is a 77 y.o. female presents to the ER via EMS C/o low back pain for several day, no known injury, pain is worse with movement, increased with bending over, pain radiates to the left leg.  Denies numbness, tingling, or changes in bowel/urinary habits,  Using otc meds without relief.  Patient has a history of a fusion of L4-5. Remainder ros neg   Past Medical History:  Diagnosis Date  . Adenoma of colon   . Adenomatous colon polyp   . Anemia    IDA  . Cataract   . Cervical stenosis of spinal canal   . Chronic kidney disease    stage 3  . Diabetes mellitus without complication ([place])   . GERD (gastroesophageal reflux disease)   . Glaucoma   . Heme positive stool   . History of UTI   . Hx of gallstones   . Hyperkalemia   . Hyperlipidemia   . Hyperlipidemia   . Hypertension   . Lumbar stenosis   . Lymphedema   . Neuromuscular disorder ([place])   . Neuropathy associated with endocrine disorder ([place])   . Obesity   . OSA on CPAP    on CPAP  . Osteoarthritis   . Osteoarthritis   . Retinopathy due to secondary diabetes ([place])   . Secondary hyperparathyroidism of renal origin [facility])     Patient Active Problem List   Diagnosis Date Noted  . Chronic venous insufficiency 12/25/2016  . Lymphedema 12/25/2016  . Diabetes ([place]) 12/25/2016  . Essential hypertension 12/25/2016  . CKD (chronic kidney disease) stage 3, GFR 30-59 ml/min (HCC) 06/23/2016  . Swelling of limb 12/31/2015  . Severe obesity (BMI >= 40) ([place]) 12/31/2015  . Varicose veins of both legs with edema 12/31/2015  . VV (varicose veins) 11/27/2015  . Other iron deficiency anemia 10/08/2015  . Anemia  due to stage 3 chronic kidney disease ([place]) 10/08/2015    Past Surgical History:  Procedure Laterality Date  . ACDF X2    . BACK SURGERY    . Cataract extraction right    . catarect extraction left    . CHOLECYSTECTOMY    . COLONOSCOPY WITH PROPOFOL N/A 12/02/2016   Procedure: COLONOSCOPY WITH PROPOFOL;  Surgeon: Manya Silvas, MD;  Location[facility] END[facility]COPY;  Service: Endoscopy;  Laterality: N/A;  . COLONOSCOPY WITH PROPOFOL N/A 11/10/2017   Procedure: COLONOSCOPY WITH PROPOFOL;  Surgeon: Toledo, Benay Pike, MD;  Location: [facility] ENDOSCOPY;  Service: Gastroenterology;  Laterality: N/A;  . colonoscopy with removal lesions by snare    . Endoscoic carpal tunnel release    . ESOPHAGOGASTRODUODENOSCOPY N/A 11/10/2017   Procedure: ESOPHAGOGASTRODUODENOSCOPY (EGD);  Surgeon: Toledo, Benay Pike, MD;  Location: [facility] ENDOSCOPY;  Service: Gastroenterology;  Laterality: N/A;  . ESOPHAGOGASTRODUODENOSCOPY (EGD) WITH PROPOFOL N/A 12/02/2016   Procedure: ESOPHAGOGASTRODUODENOSCOPY (EGD) WITH PROPOFOL;  Surgeon: Manya Silvas, MD;  Location: [facility] ENDOSCOPY;  Service: Endoscopy;  Laterality: N/A;  . IR IMAGING GUIDED PORT INSERTION  10/01/2017  . Laminectomy posterior lumbar facetectomy and formaninotomy w/Decomp    . Laminectony posterior cervicle decomp w/Facectomy and foraminotomy    . VEIN LIGATION AND STRIPPING Left     Prior to Admission medications  Medication Sig Start Date End Date Taking? Authorizing Provider  acetaminophen (TYLENOL) 500 MG tablet Take 500 mg by mouth every 6 (six) hours as needed.    Yes [provider]  amLODipine (NORVASC) 5 MG tablet Take 5 mg by mouth daily.  06/05/15  Yes [provider]  aspirin EC 81 MG tablet Take 81 mg by mouth daily.   Yes [provider]  ASSURE COMFORT LANCETS 30G [place]  07/22/16  Yes [provider]  calcitRIOL (ROCALTROL) 0.25 MCG capsule Take 0.25 mcg by mouth 3 (three) times a week.  09/24/15  Yes [provider]  carvedilol (COREG) 6.25 MG tablet Take 6.25 mg by mouth 2 (two) times daily. 05/06/16  Yes [provider]  ferrous sulfate 325 (65 FE) MG tablet Take 325 mg by mouth daily.   Yes [provider]  furosemide (LASIX) 40 MG tablet Take 40 mg by mouth daily.  08/20/15  Yes [provider]  gabapentin (NEURONTIN) 100 MG capsule Take 100 mg by mouth 2 (two) times daily.  07/15/15  Yes [provider]  gemfibrozil (LOPID) 600 MG tablet Take 600 mg by mouth daily.  08/09/15  Yes [provider]  glimepiride (AMARYL) 1 MG tablet Take 1 mg by mouth daily with breakfast.  08/20/15  Yes [provider]  Lancet Devices (ADJUSTABLE LANCING DEVICE) [place]  07/22/16  Yes [provider]  latanoprost (XALATAN) 0.005 % ophthalmic solution Place 1 drop into both eyes.  08/09/15  Yes [provider]  lidocaine-prilocaine (EMLA) cream Apply 1 application topically as needed. 10/06/17  Yes Cammie Sickle, MD  losartan (COZAAR) 50 MG tablet Take 100 mg by mouth daily.  09/24/15  Yes [provider]  Multiple Vitamins-Minerals (CENTRUM SILVER) tablet Take 1 tablet by mouth daily.  04/30/10  Yes [provider]  Omega-3 Fatty Acids (FISH OIL) 1000 MG CAPS Take 1,000 mg by mouth daily.  04/30/10  Yes [provider]  omeprazole (PRILOSEC) 20 MG capsule Take 20 mg by mouth daily.  08/19/15  Yes [provider]  pioglitazone (ACTOS) 30 MG tablet Take 30 mg by mouth daily.  05/21/15 06/22/37 Yes [provider]  timolol (TIMOPTIC-XR) 0.5 % ophthalmic gel-forming Place 1 drop into both eyes.  01/17/14  Yes [provider]  baclofen (LIORESAL) 10 MG tablet Take 1 tablet (10 mg total) by mouth 3 (three) times daily. 11/22/17 11/22/18  Fisher, Linden Dolin, PA-C  lidocaine (LIDODERM) 5 % Place 1 patch onto the skin daily. Remove & Discard patch within 12 hours or as directed by MD 11/22/17   Caryn Section Linden Dolin,  PA-C  ONE TOUCH ULTRA TEST test strip  07/22/16   [provider]  traMADol (ULTRAM) 50 MG tablet Take 1 tablet (50 mg total) by mouth every 6 (six) hours as needed. 11/22/17   Versie Starks, PA-C    Allergies Hydrocodone-acetaminophen and Statins  Family History  Problem Relation Age of Onset  . Coronary artery disease Mother   . Diabetes type II Mother   . Hypertension Mother   . Tuberculosis Father   . Stroke Father   . Diabetes type II Sister   . Migraines Sister   . Alcohol abuse Brother   . Coronary artery disease Brother   . Diabetes type II Brother   . Kidney cancer Brother   . Kidney disease Brother   . Heart attack Brother   . Hypertension Brother     Social  History Social History   Tobacco Use  . Smoking status: Never Smoker  . Smokeless tobacco: Never Used  Substance Use Topics  . Alcohol use: No  . Drug use: No    Review of Systems  Constitutional: No fever/chills Eyes: No visual changes. ENT: No sore throat. Respiratory: Denies cough Genitourinary: Negative for dysuria. Musculoskeletal: Positive for back pain. Skin: Negative for rash.    ____________________________________________   PHYSICAL EXAM:  VITAL SIGNS: ED Triage Vitals  Enc Vitals Group     BP      Pulse      Resp      Temp      Temp src      SpO2      Weight      Height      Head Circumference      Peak Flow      Pain Score      Pain Loc      Pain Edu?      Excl. in [place]?     Constitutional: Alert and oriented. Well appearing and in no acute distress. Eyes: Conjunctivae are normal.  Head: Atraumatic. Nose: No congestion/rhinnorhea. Mouth/Throat: Mucous membranes are moist.   Neck:  supple no lymphadenopathy noted Cardiovascular: Normal rate, regular rhythm. Heart sounds are normal Respiratory: Normal respiratory effort.  No retractions, lungs c t a  Abd: soft nontender bs normal all 4 quad, no pulsatile masses noted GU: deferred Musculoskeletal: FROM  all extremities, warm and well perfused.  The patient has difficulty going from the EMS stretcher to our stretcher.  She has difficulty standing and turning.  She has pain reproduced with sitting and moving to the lying down.  Decreased rom of back due to discomfort, lumbar spine tender around L4-5, negative Slr, 10/10 strength in great toes b/l, 10/10 strength in lower legs, n/v intact Neurologic:  Normal speech and language.  Skin:  Skin is warm, dry and intact. No rash noted. Psychiatric: Mood and affect are normal. Speech and behavior are normal.  ____________________________________________   LABS (all labs ordered are listed, but only abnormal results are displayed)  Labs Reviewed - No data to display ____________________________________________   ____________________________________________  RADIOLOGY  CT of the lumbar spine shows worsening degenerative changes and spondylolisthesis at L3-L4, the fusion at L4-L5 is intact  ____________________________________________   PROCEDURES  Procedure(s) performed: Percocet 5/325 1 p.o., Flexeril 10 mg p.o., Zofran 4 mg ODT  Procedures    ____________________________________________   INITIAL IMPRESSION / ASSESSMENT AND PLAN / ED COURSE  Pertinent labs & imaging results that were available during my care of the patient were reviewed by me and considered in my medical decision making (see chart for details).   Patient is a 77 year old female presents emergency department via EMS complaining of low back pain.  Patient has history of back problems.  Symptoms have increased in the last few days.  She denies any injury.  She denies any difficulty with her bowel or bladder.  She did develop no pain.  On physical exam the patient has difficulty going from sitting to standing from the EMS stretcher to our stretcher.  She was having too much discomfort to transfer over from their stretcher.  The lumbar spine is tender.  Neurovascular is  intact.  Due to the patient's previous history of surgery, CT of the lumbar spine ordered.  CT of the lumbar spine shows worsening spondylolisthesis at L3-L4 with degenerative changes.  Explained these findings to the patient.  Gave the patient a copy of the CT report.  She is to follow-up with either her previous surgeon or [facility] as she had her surgery at [facility].  She was given a prescription for baclofen and tramadol.  She can also take ibuprofen or Tylenol as needed.  She was also provided with Lidoderm patches.  She is to apply ice or wet heat to the lower back.  Follow-up with orthopedics.  Return emergency department if worsening.  She states she understands and will comply.  She was discharged in stable condition in the care of her daughter.     As part of my medical decision making, I reviewed the following data within the [place] History obtained from family, Nursing notes reviewed and incorporated, Old chart reviewed, Radiograph reviewed CT of the lumbar spine shows worsening spondylolisthesis at L3-L4, Notes from prior ED visits and  Controlled Substance Database  ____________________________________________   FINAL CLINICAL IMPRESSION(S) / ED DIAGNOSES  Final diagnoses:  Acute midline low back pain with left-sided sciatica      NEW MEDICATIONS STARTED DURING THIS VISIT:  Discharge Medication List as of 11/22/2017  1:31 PM    START taking these medications   Details  baclofen (LIORESAL) 10 MG tablet Take 1 tablet (10 mg total) by mouth 3 (three) times daily., Starting Mon 11/22/2017, Until Tue 11/22/2018, Normal    lidocaine (LIDODERM) 5 % Place 1 patch onto the skin daily. Remove & Discard patch within 12 hours or as directed by MD, Starting Mon 11/22/2017, Normal    traMADol (ULTRAM) 50 MG tablet Take 1 tablet (50 mg total) by mouth every 6 (six) hours as needed., Starting Mon 11/22/2017, Normal         Note:  This document was prepared  using Dragon voice recognition software and may include unintentional dictation errors.     Versie Starks, PA-C 11/22/17 1631    Eula Listen, MD 11/25/17 2230

## 2017-11-22 NOTE — ED Triage Notes (Signed)
Presents via ems with lower back  Pain which is moving into left leg

## 2017-11-25 DIAGNOSIS — Z86010 Personal history of colonic polyps: Secondary | ICD-10-CM | POA: Diagnosis not present

## 2017-11-25 DIAGNOSIS — D509 Iron deficiency anemia, unspecified: Secondary | ICD-10-CM | POA: Diagnosis not present

## 2017-11-29 DIAGNOSIS — I10 Essential (primary) hypertension: Secondary | ICD-10-CM | POA: Diagnosis not present

## 2017-11-29 DIAGNOSIS — Z79899 Other long term (current) drug therapy: Secondary | ICD-10-CM | POA: Diagnosis not present

## 2017-11-29 DIAGNOSIS — D631 Anemia in chronic kidney disease: Secondary | ICD-10-CM | POA: Diagnosis not present

## 2017-11-29 DIAGNOSIS — E782 Mixed hyperlipidemia: Secondary | ICD-10-CM | POA: Diagnosis not present

## 2017-11-29 DIAGNOSIS — E1142 Type 2 diabetes mellitus with diabetic polyneuropathy: Secondary | ICD-10-CM | POA: Diagnosis not present

## 2017-11-29 DIAGNOSIS — Z6841 Body Mass Index (BMI) 40.0 and over, adult: Secondary | ICD-10-CM | POA: Diagnosis not present

## 2017-11-29 DIAGNOSIS — N2581 Secondary hyperparathyroidism of renal origin: Secondary | ICD-10-CM | POA: Diagnosis not present

## 2017-11-29 DIAGNOSIS — N183 Chronic kidney disease, stage 3 (moderate): Secondary | ICD-10-CM | POA: Diagnosis not present

## 2017-11-29 DIAGNOSIS — E1122 Type 2 diabetes mellitus with diabetic chronic kidney disease: Secondary | ICD-10-CM | POA: Diagnosis not present

## 2017-12-01 DIAGNOSIS — X58XXXA Exposure to other specified factors, initial encounter: Secondary | ICD-10-CM | POA: Diagnosis not present

## 2017-12-01 DIAGNOSIS — Z9889 Other specified postprocedural states: Secondary | ICD-10-CM | POA: Diagnosis not present

## 2017-12-01 DIAGNOSIS — S39012A Strain of muscle, fascia and tendon of lower back, initial encounter: Secondary | ICD-10-CM | POA: Diagnosis not present

## 2017-12-01 DIAGNOSIS — M48062 Spinal stenosis, lumbar region with neurogenic claudication: Secondary | ICD-10-CM | POA: Diagnosis not present

## 2017-12-07 ENCOUNTER — Inpatient Hospital Stay: Payer: Medicare HMO

## 2017-12-07 ENCOUNTER — Inpatient Hospital Stay: Payer: Medicare HMO | Attending: Internal Medicine | Admitting: Internal Medicine

## 2017-12-07 VITALS — BP 157/76 | HR 74 | Temp 97.4°F | Resp 16 | Wt 254.0 lb

## 2017-12-07 DIAGNOSIS — D631 Anemia in chronic kidney disease: Secondary | ICD-10-CM | POA: Diagnosis not present

## 2017-12-07 DIAGNOSIS — Z8249 Family history of ischemic heart disease and other diseases of the circulatory system: Secondary | ICD-10-CM | POA: Insufficient documentation

## 2017-12-07 DIAGNOSIS — Z79899 Other long term (current) drug therapy: Secondary | ICD-10-CM | POA: Diagnosis not present

## 2017-12-07 DIAGNOSIS — Z7982 Long term (current) use of aspirin: Secondary | ICD-10-CM | POA: Diagnosis not present

## 2017-12-07 DIAGNOSIS — N183 Chronic kidney disease, stage 3 unspecified: Secondary | ICD-10-CM

## 2017-12-07 DIAGNOSIS — I129 Hypertensive chronic kidney disease with stage 1 through stage 4 chronic kidney disease, or unspecified chronic kidney disease: Secondary | ICD-10-CM | POA: Diagnosis not present

## 2017-12-07 DIAGNOSIS — E669 Obesity, unspecified: Secondary | ICD-10-CM | POA: Insufficient documentation

## 2017-12-07 DIAGNOSIS — Z7984 Long term (current) use of oral hypoglycemic drugs: Secondary | ICD-10-CM | POA: Diagnosis not present

## 2017-12-07 DIAGNOSIS — E1122 Type 2 diabetes mellitus with diabetic chronic kidney disease: Secondary | ICD-10-CM | POA: Diagnosis not present

## 2017-12-07 DIAGNOSIS — E1140 Type 2 diabetes mellitus with diabetic neuropathy, unspecified: Secondary | ICD-10-CM

## 2017-12-07 LAB — HEMATOCRIT: HCT: 33.5 % — ABNORMAL LOW (ref 35.0–47.0)

## 2017-12-07 LAB — HEMOGLOBIN: Hemoglobin: 11.1 g/dL — ABNORMAL LOW (ref 12.0–16.0)

## 2017-12-07 LAB — SAMPLE TO BLOOD BANK

## 2017-12-07 NOTE — Assessment & Plan Note (Addendum)
#   IDA/ CKD-hemoglobin is today's 11.1; improving. Re-start PO iron. HOLD off IV iron. HOLD off aranesp today.   # ? chronic GI bleed./Positive Hemoccult. but GI work up-NEG; Monitor for now.   # follow up in 4 weeks/ labs-cbc/iron studies/ferritin; possible venofer/aranesp-Dr.B  CC: Dr.Lateef;

## 2017-12-07 NOTE — Progress Notes (Signed)
[place] OFFICE PROGRESS NOTE  Patient Care Team: Gauger, Victoriano Lain, NP as PCP - General (Internal Medicine)  Cancer Staging No matching staging information was found for the patient.  # IRON DEF ANEMIA/ CKD [Dr.Lateef; Dr.Gittin] sep 2017- Stool/UA/Myeloma work up-NEG;colo- 2010 [per pt] Sep 2017- IV venofer; June 2018-  Aranesp; July 2019-stool occult test positive; [id number]- EGD/colo [Dr.Toledo-NEG]  # CKD-III [1.7]; Dr.Lateef   No history exists.    INTERVAL HISTORY:  Sara Bright 77 y.o.  female pleasant patient above history of Chronic anemia/chronic kidney disease; is here for follow-up.  Patient notes to have improvement of energy levels.  Appetite is good.  No weight loss.  No blood in stools or black or stools.  Denies any worsening swelling in the legs.  Review of Systems  Constitutional: Negative for chills, diaphoresis, fever, malaise/fatigue and weight loss.  HENT: Negative for nosebleeds and sore throat.   Eyes: Negative for double vision.  Respiratory: Negative for cough, hemoptysis, sputum production, shortness of breath and wheezing.   Cardiovascular: Negative for chest pain, palpitations, orthopnea and leg swelling.  Gastrointestinal: Negative for abdominal pain, blood in stool, constipation, diarrhea, heartburn, melena, nausea and vomiting.  Genitourinary: Negative for dysuria, frequency and urgency.  Musculoskeletal: Negative for back pain and joint pain.  Skin: Negative.  Negative for itching and rash.  Neurological: Negative for dizziness, tingling, focal weakness, weakness and headaches.  Endo/Heme/Allergies: Does not bruise/bleed easily.  Psychiatric/Behavioral: Negative for depression. The patient is not nervous/anxious and does not have insomnia.      PAST MEDICAL HISTORY :  Past Medical History:  Diagnosis Date  . Adenoma of colon   . Adenomatous colon polyp   . Anemia    IDA  . Cataract   . Cervical stenosis of spinal  [place]   . Chronic kidney disease    stage 3  . Diabetes mellitus without complication ([place])   . GERD (gastroesophageal reflux disease)   . Glaucoma   . Heme positive stool   . History of UTI   . Hx of gallstones   . Hyperkalemia   . Hyperlipidemia   . Hyperlipidemia   . Hypertension   . Lumbar stenosis   . Lymphedema   . Neuromuscular disorder ([place])   . Neuropathy associated with endocrine disorder ([place])   . Obesity   . OSA on CPAP    on CPAP  . Osteoarthritis   . Osteoarthritis   . Retinopathy due to secondary diabetes ([place])   . Secondary hyperparathyroidism of renal origin [facility])     PAST SURGICAL HISTORY :   Past Surgical History:  Procedure Laterality Date  . ACDF X2    . BACK SURGERY    . Cataract extraction right    . catarect extraction left    . CHOLECYSTECTOMY    . COLONOSCOPY WITH PROPOFOL N/A 12/02/2016   Procedure: COLONOSCOPY WITH PROPOFOL;  Surgeon: Manya Silvas, MD;  Location: [facility] ENDOSCOPY;  Service: Endoscopy;  Laterality: N/A;  . COLONOSCOPY WITH PROPOFOL N/A 11/10/2017   Procedure: COLONOSCOPY WITH PROPOFOL;  Surgeon: Toledo, Benay Pike, MD;  Location: [facility] ENDOSCOPY;  Service: Gastroenterology;  Laterality: N/A;  . colonoscopy with removal lesions by snare    . Endoscoic carpal tunnel release    . ESOPHAGOGASTRODUODENOSCOPY N/A 11/10/2017   Procedure: ESOPHAGOGASTRODUODENOSCOPY (EGD);  Surgeon: Toledo, Benay Pike, MD;  Location: [facility] ENDOSCOPY;  Service: Gastroenterology;  Laterality: N/A;  . ESOPHAGOGASTRODUODENOSCOPY (EGD) WITH PROPOFOL N/A 12/02/2016   Procedure: ESOPHAGOGASTRODUODENOSCOPY (  EGD) WITH PROPOFOL;  Surgeon: Manya Silvas, MD;  Location: [facility] ENDOSCOPY;  Service: Endoscopy;  Laterality: N/A;  . IR IMAGING GUIDED PORT INSERTION  10/01/2017  . Laminectomy posterior lumbar facetectomy and formaninotomy w/Decomp    . Laminectony posterior cervicle decomp w/Facectomy and foraminotomy    . VEIN LIGATION AND STRIPPING Left     FAMILY  HISTORY :   Family History  Problem Relation Age of Onset  . Coronary artery disease Mother   . Diabetes type II Mother   . Hypertension Mother   . Tuberculosis Father   . Stroke Father   . Diabetes type II Sister   . Migraines Sister   . Alcohol abuse Brother   . Coronary artery disease Brother   . Diabetes type II Brother   . Kidney cancer Brother   . Kidney disease Brother   . Heart attack Brother   . Hypertension Brother     SOCIAL HISTORY:   Social History   Tobacco Use  . Smoking status: Never Smoker  . Smokeless tobacco: Never Used  Substance Use Topics  . Alcohol use: No  . Drug use: No    ALLERGIES:  is allergic to hydrocodone-acetaminophen and statins.  MEDICATIONS:  Current Outpatient Medications  Medication Sig Dispense Refill  . acetaminophen (TYLENOL) 500 MG tablet Take 500 mg by mouth every 6 (six) hours as needed.     Marland Kitchen amLODipine (NORVASC) 5 MG tablet Take 5 mg by mouth daily.     Marland Kitchen aspirin EC 81 MG tablet Take 81 mg by mouth daily.    . ASSURE COMFORT LANCETS 30G MISC     . baclofen (LIORESAL) 10 MG tablet Take 1 tablet (10 mg total) by mouth 3 (three) times daily. 90 tablet 1  . calcitRIOL (ROCALTROL) 0.25 MCG capsule Take 0.25 mcg by mouth 3 (three) times a week.     . carvedilol (COREG) 6.25 MG tablet Take 6.25 mg by mouth 2 (two) times daily.  12  . ferrous sulfate 325 (65 FE) MG tablet Take 325 mg by mouth daily.    . furosemide (LASIX) 40 MG tablet Take 40 mg by mouth daily.     Marland Kitchen gabapentin (NEURONTIN) 100 MG capsule Take 100 mg by mouth 2 (two) times daily.     Marland Kitchen gemfibrozil (LOPID) 600 MG tablet Take 600 mg by mouth daily.     Marland Kitchen glimepiride (AMARYL) 1 MG tablet Take 1 mg by mouth daily with breakfast.     . Lancet Devices (ADJUSTABLE LANCING DEVICE) MISC     . latanoprost (XALATAN) 0.005 % ophthalmic solution Place 1 drop into both eyes.     Marland Kitchen lidocaine (LIDODERM) 5 % Place 1 patch onto the skin daily. Remove & Discard patch within 12 hours  or as directed by MD 30 patch 0  . lidocaine-prilocaine (EMLA) cream Apply 1 application topically as needed. 30 g 3  . losartan (COZAAR) 50 MG tablet Take 100 mg by mouth daily.     . Multiple Vitamins-Minerals (CENTRUM SILVER) tablet Take 1 tablet by mouth daily.     . Omega-3 Fatty Acids (FISH OIL) 1000 MG CAPS Take 1,000 mg by mouth daily.     Marland Kitchen omeprazole (PRILOSEC) 20 MG capsule Take 20 mg by mouth daily.     . ONE TOUCH ULTRA TEST test strip     . pioglitazone (ACTOS) 30 MG tablet Take 30 mg by mouth daily.     . timolol (TIMOPTIC-XR) 0.5 % ophthalmic  gel-forming Place 1 drop into both eyes.     . traMADol (ULTRAM) 50 MG tablet Take 1 tablet (50 mg total) by mouth every 6 (six) hours as needed. 15 tablet 0  . hydrALAZINE (APRESOLINE) 25 MG tablet Take 25 mg by mouth 3 (three) times daily.  12   No current facility-administered medications for this visit.     PHYSICAL EXAMINATION:  BP (!) 157/76 (BP Location: Left Arm, Patient Position: Sitting)   Pulse 74   Temp (!) 97.4 F (36.3 C) (Tympanic)   Resp 16   Wt 254 lb (115.2 kg)   BMI 43.60 kg/m   Filed Weights   12/07/17 1344  Weight: 254 lb (115.2 kg)    Physical Exam  Constitutional: She is oriented to person, place, and time.  Obese.  Walks with a cane.  Alone.  HENT:  Head: Normocephalic and atraumatic.  Mouth/Throat: Oropharynx is clear and moist. No oropharyngeal exudate.  Eyes: Pupils are equal, round, and reactive to light.  Neck: Normal range of motion. Neck supple.  Cardiovascular: Normal rate and regular rhythm.  Pulmonary/Chest: No respiratory distress. She has no wheezes.  Abdominal: Soft. Bowel sounds are normal. She exhibits no distension and no mass. There is no tenderness. There is no rebound and no guarding.  Musculoskeletal: Normal range of motion. She exhibits no edema or tenderness.  Neurological: She is alert and oriented to person, place, and time.  Skin: Skin is warm.  Psychiatric: Affect  normal.    LABORATORY DATA:  I have reviewed the data as listed    Component Value Date/Time   NA 141 10/01/2017 0810   NA 137 10/23/2012 2335   K 4.8 10/01/2017 0810   K 4.4 10/23/2012 2335   CL 110 10/01/2017 0810   CL 107 10/23/2012 2335   CO2 25 10/01/2017 0810   CO2 26 10/23/2012 2335   GLUCOSE 93 10/01/2017 0810   GLUCOSE 179 (H) 10/23/2012 2335   BUN 58 (H) 10/01/2017 0810   BUN 21 (H) 10/23/2012 2335   CREATININE 2.01 (H) 10/01/2017 0810   CREATININE 1.23 10/23/2012 2335   CALCIUM 9.7 10/01/2017 0810   CALCIUM 10.3 (H) 10/23/2012 2335   PROT 6.4 (L) 09/22/2017 1031   PROT 7.4 06/16/2011 1144   ALBUMIN 3.3 (L) 09/22/2017 1031   ALBUMIN 3.5 06/16/2011 1144   AST 20 09/22/2017 1031   AST 22 08/18/2011 1026   ALT 17 09/22/2017 1031   ALT 63 06/16/2011 1144   ALKPHOS 60 09/22/2017 1031   ALKPHOS 98 06/16/2011 1144   BILITOT 0.7 09/22/2017 1031   BILITOT 0.4 06/16/2011 1144   GFRNONAA 23 (L) 10/01/2017 0810   GFRNONAA 43 (L) 10/23/2012 2335   GFRAA 26 (L) 10/01/2017 0810   GFRAA 50 (L) 10/23/2012 2335    No results found for: SPEP, UPEP  Lab Results  Component Value Date   WBC 3.7 10/06/2017   NEUTROABS 2.1 10/06/2017   HGB 11.1 (L) 12/07/2017   HCT 33.5 (L) 12/07/2017   MCV 103.3 (H) 10/06/2017   PLT 186 10/06/2017      Chemistry      Component Value Date/Time   NA 141 10/01/2017 0810   NA 137 10/23/2012 2335   K 4.8 10/01/2017 0810   K 4.4 10/23/2012 2335   CL 110 10/01/2017 0810   CL 107 10/23/2012 2335   CO2 25 10/01/2017 0810   CO2 26 10/23/2012 2335   BUN 58 (H) 10/01/2017 0810   BUN 21 (H)  10/23/2012 2335   CREATININE 2.01 (H) 10/01/2017 0810   CREATININE 1.23 10/23/2012 2335      Component Value Date/Time   CALCIUM 9.7 10/01/2017 0810   CALCIUM 10.3 (H) 10/23/2012 2335   ALKPHOS 60 09/22/2017 1031   ALKPHOS 98 06/16/2011 1144   AST 20 09/22/2017 1031   AST 22 08/18/2011 1026   ALT 17 09/22/2017 1031   ALT 63 06/16/2011 1144    BILITOT 0.7 09/22/2017 1031   BILITOT 0.4 06/16/2011 1144     Results for Sara Bright, Sara Bright (MRN [id number]) as of 02/18/2017 10:08  Ref. Range 05/12/2016 08:53 09/01/2016 13:52 11/19/2016 08:42 12/22/2016 10:29 01/19/2017 10:20  Iron Latest Ref Range: 28 - 170 ug/dL 75 67 62 79 54  UIBC Latest Units: ug/dL 259 266 280 236 250  TIBC Latest Ref Range: 250 - 450 ug/dL 334 333 342 315 304  Saturation Ratios Latest Ref Range: 10.4 - 31.8 % 23 20 18 25 18   Ferritin Latest Ref Range: 11 - 307 ng/mL 651 (H) 467 (H) 413 (H) 633 (H) 374 (H)    RADIOGRAPHIC STUDIES: I have personally reviewed the radiological images as listed and agreed with the findings in the report. No results found.   ASSESSMENT & PLAN:  Anemia due to stage 3 chronic kidney disease # IDA/ CKD-hemoglobin is today's 11.1; improving. Re-start PO iron. HOLD off IV iron. HOLD off aranesp today.   # ? chronic GI bleed./Positive Hemoccult. but GI work up-NEG; Monitor for now.   # follow up in 4 weeks/ labs-cbc/iron studies/ferritin; possible venofer/aranesp-Dr.B  CC: Dr.Lateef;   No orders of the defined types were placed in this encounter.  All questions were answered. The patient knows to call the clinic with any problems, questions or concerns.      Cammie Sickle, MD 12/07/2017 2:12 PM

## 2017-12-17 DIAGNOSIS — Z23 Encounter for immunization: Secondary | ICD-10-CM | POA: Diagnosis not present

## 2017-12-20 DIAGNOSIS — G4733 Obstructive sleep apnea (adult) (pediatric): Secondary | ICD-10-CM | POA: Diagnosis not present

## 2018-01-04 ENCOUNTER — Inpatient Hospital Stay: Payer: Medicare HMO

## 2018-01-04 ENCOUNTER — Inpatient Hospital Stay (HOSPITAL_BASED_OUTPATIENT_CLINIC_OR_DEPARTMENT_OTHER): Payer: Medicare HMO | Admitting: Internal Medicine

## 2018-01-04 ENCOUNTER — Encounter: Payer: Self-pay | Admitting: Internal Medicine

## 2018-01-04 VITALS — BP 177/73 | HR 63 | Temp 97.0°F | Resp 16

## 2018-01-04 VITALS — BP 146/71 | HR 59 | Resp 18

## 2018-01-04 DIAGNOSIS — I129 Hypertensive chronic kidney disease with stage 1 through stage 4 chronic kidney disease, or unspecified chronic kidney disease: Secondary | ICD-10-CM | POA: Diagnosis not present

## 2018-01-04 DIAGNOSIS — D631 Anemia in chronic kidney disease: Secondary | ICD-10-CM

## 2018-01-04 DIAGNOSIS — N183 Chronic kidney disease, stage 3 unspecified: Secondary | ICD-10-CM

## 2018-01-04 DIAGNOSIS — E1122 Type 2 diabetes mellitus with diabetic chronic kidney disease: Secondary | ICD-10-CM | POA: Diagnosis not present

## 2018-01-04 DIAGNOSIS — Z7982 Long term (current) use of aspirin: Secondary | ICD-10-CM

## 2018-01-04 DIAGNOSIS — Z79899 Other long term (current) drug therapy: Secondary | ICD-10-CM

## 2018-01-04 DIAGNOSIS — Z7984 Long term (current) use of oral hypoglycemic drugs: Secondary | ICD-10-CM

## 2018-01-04 DIAGNOSIS — E669 Obesity, unspecified: Secondary | ICD-10-CM

## 2018-01-04 DIAGNOSIS — E1140 Type 2 diabetes mellitus with diabetic neuropathy, unspecified: Secondary | ICD-10-CM

## 2018-01-04 DIAGNOSIS — Z8249 Family history of ischemic heart disease and other diseases of the circulatory system: Secondary | ICD-10-CM

## 2018-01-04 LAB — SAMPLE TO BLOOD BANK

## 2018-01-04 LAB — HEMOGLOBIN: Hemoglobin: 9.7 g/dL — ABNORMAL LOW (ref 12.0–15.0)

## 2018-01-04 LAB — HEMATOCRIT: HEMATOCRIT: 31.2 % — AB (ref 36.0–46.0)

## 2018-01-04 MED ORDER — DARBEPOETIN ALFA 300 MCG/0.6ML IJ SOSY
300.0000 ug | PREFILLED_SYRINGE | Freq: Once | INTRAMUSCULAR | Status: AC
  Administered 2018-01-04: 300 ug via SUBCUTANEOUS

## 2018-01-04 NOTE — Assessment & Plan Note (Addendum)
#   IDA/ CKD-hemoglobin is today's 9.7; STABLE. Continue  PO iron. HOLD off IV iron. Proceed with Aranesp today. Will repeat iron studies/ferritin in 1 month; if low then recommend IV Venofer.  # HTN- poorly controlled; repeat 160/67; "140s" at home. Okay to proceed with Aranesp today.   #Disposition:  #Aranesp today #Aranesp in 4 weeks/H&H/ferritin/iron studies/BMP/ MD  CC: Dr.Lateef;

## 2018-01-04 NOTE — Progress Notes (Signed)
[place] OFFICE PROGRESS NOTE  Patient Care Team: Gauger, Victoriano Lain, NP as PCP - General (Internal Medicine)  Cancer Staging No matching staging information was found for the patient.  # IRON DEF ANEMIA/ CKD [Dr.Lateef; Dr.Gittin] sep 2017- Stool/UA/Myeloma work up-NEG;colo- 2010 [per pt] Sep 2017- IV venofer; June 2018-  Aranesp; July 2019-stool occult test positive; [id number]- EGD/colo [Dr.Toledo-NEG]  # CKD-III [1.7]; Dr.Lateef   No history exists.    INTERVAL HISTORY:  Sara Bright 77 y.o.  female pleasant patient above history of Chronic anemia/chronic kidney disease; is here for follow-up.  Patient continues to be on iron pills; however intermittently.   No blood in stools.  Mild to moderate fatigue.  Not any worse.  No swelling in the legs.  Review of Systems  Constitutional: Negative for chills, diaphoresis, fever, malaise/fatigue and weight loss.  HENT: Negative for nosebleeds and sore throat.   Eyes: Negative for double vision.  Respiratory: Negative for cough, hemoptysis, sputum production, shortness of breath and wheezing.   Cardiovascular: Negative for chest pain, palpitations, orthopnea and leg swelling.  Gastrointestinal: Negative for abdominal pain, blood in stool, constipation, diarrhea, heartburn, melena, nausea and vomiting.  Genitourinary: Negative for dysuria, frequency and urgency.  Musculoskeletal: Negative for back pain and joint pain.  Skin: Negative.  Negative for itching and rash.  Neurological: Negative for dizziness, tingling, focal weakness, weakness and headaches.  Endo/Heme/Allergies: Does not bruise/bleed easily.  Psychiatric/Behavioral: Negative for depression. The patient is not nervous/anxious and does not have insomnia.      PAST MEDICAL HISTORY :  Past Medical History:  Diagnosis Date  . Adenoma of colon   . Adenomatous colon polyp   . Anemia    IDA  . Cataract   . Cervical stenosis of spinal canal   .  Chronic kidney disease    stage 3  . Diabetes mellitus without complication ([place])   . GERD (gastroesophageal reflux disease)   . Glaucoma   . Heme positive stool   . History of UTI   . Hx of gallstones   . Hyperkalemia   . Hyperlipidemia   . Hyperlipidemia   . Hypertension   . Lumbar stenosis   . Lymphedema   . Neuromuscular disorder ([place])   . Neuropathy associated with endocrine disorder ([place])   . Obesity   . OSA on CPAP    on CPAP  . Osteoarthritis   . Osteoarthritis   . Retinopathy due to secondary diabetes ([place])   . Secondary hyperparathyroidism of renal origin [facility])     PAST SURGICAL HISTORY :   Past Surgical History:  Procedure Laterality Date  . ACDF X2    . BACK SURGERY    . Cataract extraction right    . catarect extraction left    . CHOLECYSTECTOMY    . COLONOSCOPY WITH PROPOFOL N/A 12/02/2016   Procedure: COLONOSCOPY WITH PROPOFOL;  Surgeon: Manya Silvas, MD;  Location: [facility] ENDOSCOPY;  Service: Endoscopy;  Laterality: N/A;  . COLONOSCOPY WITH PROPOFOL N/A 11/10/2017   Procedure: COLONOSCOPY WITH PROPOFOL;  Surgeon: Toledo, Benay Pike, MD;  Location: [facility] ENDOSCOPY;  Service: Gastroenterology;  Laterality: N/A;  . colonoscopy with removal lesions by snare    . Endoscoic carpal tunnel release    . ESOPHAGOGASTRODUODENOSCOPY N/A 11/10/2017   Procedure: ESOPHAGOGASTRODUODENOSCOPY (EGD);  Surgeon: Toledo, Benay Pike, MD;  Location: [facility] ENDOSCOPY;  Service: Gastroenterology;  Laterality: N/A;  . ESOPHAGOGASTRODUODENOSCOPY (EGD) WITH PROPOFOL N/A 12/02/2016   Procedure: ESOPHAGOGASTRODUODENOSCOPY (EGD) WITH PROPOFOL;  Surgeon: Manya Silvas, MD;  Location: [facility] ENDOSCOPY;  Service: Endoscopy;  Laterality: N/A;  . IR IMAGING GUIDED PORT INSERTION  10/01/2017  . Laminectomy posterior lumbar facetectomy and formaninotomy w/Decomp    . Laminectony posterior cervicle decomp w/Facectomy and foraminotomy    . VEIN LIGATION AND STRIPPING Left     FAMILY HISTORY :    Family History  Problem Relation Age of Onset  . Coronary artery disease Mother   . Diabetes type II Mother   . Hypertension Mother   . Tuberculosis Father   . Stroke Father   . Diabetes type II Sister   . Migraines Sister   . Alcohol abuse Brother   . Coronary artery disease Brother   . Diabetes type II Brother   . Kidney cancer Brother   . Kidney disease Brother   . Heart attack Brother   . Hypertension Brother     SOCIAL HISTORY:   Social History   Tobacco Use  . Smoking status: Never Smoker  . Smokeless tobacco: Never Used  Substance Use Topics  . Alcohol use: No  . Drug use: No    ALLERGIES:  is allergic to hydrocodone-acetaminophen and statins.  MEDICATIONS:  Current Outpatient Medications  Medication Sig Dispense Refill  . acetaminophen (TYLENOL) 500 MG tablet Take 500 mg by mouth every 6 (six) hours as needed.     Marland Kitchen amLODipine (NORVASC) 5 MG tablet Take 5 mg by mouth daily.     Marland Kitchen aspirin EC 81 MG tablet Take 81 mg by mouth daily.    . ASSURE COMFORT LANCETS 30G MISC     . baclofen (LIORESAL) 10 MG tablet Take 1 tablet (10 mg total) by mouth 3 (three) times daily. 90 tablet 1  . calcitRIOL (ROCALTROL) 0.25 MCG capsule Take 0.25 mcg by mouth 3 (three) times a week.     . carvedilol (COREG) 6.25 MG tablet Take 6.25 mg by mouth 2 (two) times daily.  12  . ferrous sulfate 325 (65 FE) MG tablet Take 325 mg by mouth daily.    . furosemide (LASIX) 40 MG tablet Take 40 mg by mouth daily.     Marland Kitchen gabapentin (NEURONTIN) 100 MG capsule Take 100 mg by mouth 2 (two) times daily.     Marland Kitchen gemfibrozil (LOPID) 600 MG tablet Take 600 mg by mouth daily.     Marland Kitchen glimepiride (AMARYL) 1 MG tablet Take 1 mg by mouth daily with breakfast.     . hydrALAZINE (APRESOLINE) 25 MG tablet Take 25 mg by mouth 3 (three) times daily.  12  . Lancet Devices (ADJUSTABLE LANCING DEVICE) MISC     . latanoprost (XALATAN) 0.005 % ophthalmic solution Place 1 drop into both eyes.     Marland Kitchen lidocaine (LIDODERM)  5 % Place 1 patch onto the skin daily. Remove & Discard patch within 12 hours or as directed by MD 30 patch 0  . lidocaine-prilocaine (EMLA) cream Apply 1 application topically as needed. 30 g 3  . losartan (COZAAR) 50 MG tablet Take 100 mg by mouth daily.     . Multiple Vitamins-Minerals (CENTRUM SILVER) tablet Take 1 tablet by mouth daily.     . Omega-3 Fatty Acids (FISH OIL) 1000 MG CAPS Take 1,000 mg by mouth daily.     Marland Kitchen omeprazole (PRILOSEC) 20 MG capsule Take 20 mg by mouth daily.     . ONE TOUCH ULTRA TEST test strip     . pioglitazone (ACTOS) 30 MG tablet Take 30  mg by mouth daily.     . timolol (TIMOPTIC-XR) 0.5 % ophthalmic gel-forming Place 1 drop into both eyes.     . traMADol (ULTRAM) 50 MG tablet Take 1 tablet (50 mg total) by mouth every 6 (six) hours as needed. 15 tablet 0   No current facility-administered medications for this visit.    Facility-Administered Medications Ordered in Other Visits  Medication Dose Route Frequency Provider Last Rate Last Dose  . Darbepoetin Alfa (ARANESP) injection 300 mcg  300 mcg Subcutaneous Once Charlaine Dalton R, MD        PHYSICAL EXAMINATION:  BP (!) 177/73 (BP Location: Left Arm, Patient Position: Sitting)   Pulse 63   Temp (!) 97 F (36.1 C) (Tympanic)   Resp 16   There were no vitals filed for this visit.  Physical Exam  Constitutional: She is oriented to person, place, and time.  Obese.  Walks with a cane.  Accompanied by family.  HENT:  Head: Normocephalic and atraumatic.  Mouth/Throat: Oropharynx is clear and moist. No oropharyngeal exudate.  Eyes: Pupils are equal, round, and reactive to light.  Neck: Normal range of motion. Neck supple.  Cardiovascular: Normal rate and regular rhythm.  Pulmonary/Chest: No respiratory distress. She has no wheezes.  Abdominal: Soft. Bowel sounds are normal. She exhibits no distension and no mass. There is no tenderness. There is no rebound and no guarding.  Musculoskeletal: Normal  range of motion. She exhibits no edema or tenderness.  Neurological: She is alert and oriented to person, place, and time.  Skin: Skin is warm.  Psychiatric: Affect normal.    LABORATORY DATA:  I have reviewed the data as listed    Component Value Date/Time   NA 141 10/01/2017 0810   NA 137 10/23/2012 2335   K 4.8 10/01/2017 0810   K 4.4 10/23/2012 2335   CL 110 10/01/2017 0810   CL 107 10/23/2012 2335   CO2 25 10/01/2017 0810   CO2 26 10/23/2012 2335   GLUCOSE 93 10/01/2017 0810   GLUCOSE 179 (H) 10/23/2012 2335   BUN 58 (H) 10/01/2017 0810   BUN 21 (H) 10/23/2012 2335   CREATININE 2.01 (H) 10/01/2017 0810   CREATININE 1.23 10/23/2012 2335   CALCIUM 9.7 10/01/2017 0810   CALCIUM 10.3 (H) 10/23/2012 2335   PROT 6.4 (L) 09/22/2017 1031   PROT 7.4 06/16/2011 1144   ALBUMIN 3.3 (L) 09/22/2017 1031   ALBUMIN 3.5 06/16/2011 1144   AST 20 09/22/2017 1031   AST 22 08/18/2011 1026   ALT 17 09/22/2017 1031   ALT 63 06/16/2011 1144   ALKPHOS 60 09/22/2017 1031   ALKPHOS 98 06/16/2011 1144   BILITOT 0.7 09/22/2017 1031   BILITOT 0.4 06/16/2011 1144   GFRNONAA 23 (L) 10/01/2017 0810   GFRNONAA 43 (L) 10/23/2012 2335   GFRAA 26 (L) 10/01/2017 0810   GFRAA 50 (L) 10/23/2012 2335    No results found for: SPEP, UPEP  Lab Results  Component Value Date   WBC 3.7 10/06/2017   NEUTROABS 2.1 10/06/2017   HGB 9.7 (L) 01/04/2018   HCT 31.2 (L) 01/04/2018   MCV 103.3 (H) 10/06/2017   PLT 186 10/06/2017      Chemistry      Component Value Date/Time   NA 141 10/01/2017 0810   NA 137 10/23/2012 2335   K 4.8 10/01/2017 0810   K 4.4 10/23/2012 2335   CL 110 10/01/2017 0810   CL 107 10/23/2012 2335   CO2 25 10/01/2017  0810   CO2 26 10/23/2012 2335   BUN 58 (H) 10/01/2017 0810   BUN 21 (H) 10/23/2012 2335   CREATININE 2.01 (H) 10/01/2017 0810   CREATININE 1.23 10/23/2012 2335      Component Value Date/Time   CALCIUM 9.7 10/01/2017 0810   CALCIUM 10.3 (H) 10/23/2012 2335    ALKPHOS 60 09/22/2017 1031   ALKPHOS 98 06/16/2011 1144   AST 20 09/22/2017 1031   AST 22 08/18/2011 1026   ALT 17 09/22/2017 1031   ALT 63 06/16/2011 1144   BILITOT 0.7 09/22/2017 1031   BILITOT 0.4 06/16/2011 1144     Results for ANAIZA, BEHRENS (MRN [id number]) as of 02/18/2017 10:08  Ref. Range 05/12/2016 08:53 09/01/2016 13:52 11/19/2016 08:42 12/22/2016 10:29 01/19/2017 10:20  Iron Latest Ref Range: 28 - 170 ug/dL 75 67 62 79 54  UIBC Latest Units: ug/dL 259 266 280 236 250  TIBC Latest Ref Range: 250 - 450 ug/dL 334 333 342 315 304  Saturation Ratios Latest Ref Range: 10.4 - 31.8 % 23 20 18 25 18   Ferritin Latest Ref Range: 11 - 307 ng/mL 651 (H) 467 (H) 413 (H) 633 (H) 374 (H)    RADIOGRAPHIC STUDIES: I have personally reviewed the radiological images as listed and agreed with the findings in the report. No results found.   ASSESSMENT & PLAN:  Anemia due to stage 3 chronic kidney disease # IDA/ CKD-hemoglobin is today's 9.7; STABLE. Continue  PO iron. HOLD off IV iron. Proceed with Aranesp today. Will repeat iron studies/ferritin in 1 month; if low then recommend IV Venofer.  # HTN- poorly controlled; repeat 160/67; "140s" at home. Okay to proceed with Aranesp today.   #Disposition:  #Aranesp today #Aranesp in 4 weeks/H&H/ferritin/iron studies/BMP/ MD  CC: Dr.Lateef;   No orders of the defined types were placed in this encounter.  All questions were answered. The patient knows to call the clinic with any problems, questions or concerns.      Cammie Sickle, MD 01/04/2018 1:51 PM

## 2018-01-04 NOTE — Patient Instructions (Signed)

## 2018-01-20 DIAGNOSIS — G4733 Obstructive sleep apnea (adult) (pediatric): Secondary | ICD-10-CM | POA: Diagnosis not present

## 2018-01-26 DIAGNOSIS — K08109 Complete loss of teeth, unspecified cause, unspecified class: Secondary | ICD-10-CM | POA: Diagnosis not present

## 2018-01-26 DIAGNOSIS — G8929 Other chronic pain: Secondary | ICD-10-CM | POA: Diagnosis not present

## 2018-01-26 DIAGNOSIS — H409 Unspecified glaucoma: Secondary | ICD-10-CM | POA: Diagnosis not present

## 2018-01-26 DIAGNOSIS — E1142 Type 2 diabetes mellitus with diabetic polyneuropathy: Secondary | ICD-10-CM | POA: Diagnosis not present

## 2018-01-26 DIAGNOSIS — K219 Gastro-esophageal reflux disease without esophagitis: Secondary | ICD-10-CM | POA: Diagnosis not present

## 2018-01-26 DIAGNOSIS — E785 Hyperlipidemia, unspecified: Secondary | ICD-10-CM | POA: Diagnosis not present

## 2018-01-26 DIAGNOSIS — I10 Essential (primary) hypertension: Secondary | ICD-10-CM | POA: Diagnosis not present

## 2018-01-26 DIAGNOSIS — E1151 Type 2 diabetes mellitus with diabetic peripheral angiopathy without gangrene: Secondary | ICD-10-CM | POA: Diagnosis not present

## 2018-01-26 DIAGNOSIS — D509 Iron deficiency anemia, unspecified: Secondary | ICD-10-CM | POA: Diagnosis not present

## 2018-01-31 ENCOUNTER — Inpatient Hospital Stay: Payer: Medicare HMO | Attending: Internal Medicine

## 2018-01-31 DIAGNOSIS — E1122 Type 2 diabetes mellitus with diabetic chronic kidney disease: Secondary | ICD-10-CM | POA: Diagnosis not present

## 2018-01-31 DIAGNOSIS — N183 Chronic kidney disease, stage 3 unspecified: Secondary | ICD-10-CM

## 2018-01-31 DIAGNOSIS — I129 Hypertensive chronic kidney disease with stage 1 through stage 4 chronic kidney disease, or unspecified chronic kidney disease: Secondary | ICD-10-CM | POA: Insufficient documentation

## 2018-01-31 DIAGNOSIS — D509 Iron deficiency anemia, unspecified: Secondary | ICD-10-CM | POA: Diagnosis not present

## 2018-01-31 DIAGNOSIS — Z79899 Other long term (current) drug therapy: Secondary | ICD-10-CM | POA: Diagnosis not present

## 2018-01-31 DIAGNOSIS — Z7984 Long term (current) use of oral hypoglycemic drugs: Secondary | ICD-10-CM | POA: Insufficient documentation

## 2018-01-31 DIAGNOSIS — Z7982 Long term (current) use of aspirin: Secondary | ICD-10-CM | POA: Diagnosis not present

## 2018-01-31 DIAGNOSIS — K219 Gastro-esophageal reflux disease without esophagitis: Secondary | ICD-10-CM | POA: Diagnosis not present

## 2018-01-31 DIAGNOSIS — D631 Anemia in chronic kidney disease: Secondary | ICD-10-CM | POA: Diagnosis present

## 2018-01-31 DIAGNOSIS — Z452 Encounter for adjustment and management of vascular access device: Secondary | ICD-10-CM | POA: Diagnosis present

## 2018-01-31 DIAGNOSIS — R5383 Other fatigue: Secondary | ICD-10-CM | POA: Diagnosis not present

## 2018-01-31 DIAGNOSIS — Z8249 Family history of ischemic heart disease and other diseases of the circulatory system: Secondary | ICD-10-CM | POA: Diagnosis not present

## 2018-01-31 LAB — HEMATOCRIT: HEMATOCRIT: 32.9 % — AB (ref 36.0–46.0)

## 2018-01-31 LAB — HEMOGLOBIN: Hemoglobin: 10.1 g/dL — ABNORMAL LOW (ref 12.0–15.0)

## 2018-01-31 LAB — SAMPLE TO BLOOD BANK

## 2018-01-31 MED ORDER — HEPARIN SOD (PORK) LOCK FLUSH 100 UNIT/ML IV SOLN
500.0000 [IU] | Freq: Once | INTRAVENOUS | Status: AC
  Administered 2018-01-31: 500 [IU] via INTRAVENOUS

## 2018-01-31 MED ORDER — SODIUM CHLORIDE 0.9% FLUSH
10.0000 mL | INTRAVENOUS | Status: DC | PRN
  Administered 2018-01-31: 10 mL via INTRAVENOUS
  Filled 2018-01-31: qty 10

## 2018-02-01 ENCOUNTER — Inpatient Hospital Stay (HOSPITAL_BASED_OUTPATIENT_CLINIC_OR_DEPARTMENT_OTHER): Payer: Medicare HMO | Admitting: Internal Medicine

## 2018-02-01 ENCOUNTER — Inpatient Hospital Stay: Payer: Medicare HMO

## 2018-02-01 VITALS — BP 147/72 | HR 62 | Temp 97.9°F | Resp 16 | Wt 254.0 lb

## 2018-02-01 DIAGNOSIS — Z79899 Other long term (current) drug therapy: Secondary | ICD-10-CM

## 2018-02-01 DIAGNOSIS — I10 Essential (primary) hypertension: Secondary | ICD-10-CM

## 2018-02-01 DIAGNOSIS — E1122 Type 2 diabetes mellitus with diabetic chronic kidney disease: Secondary | ICD-10-CM

## 2018-02-01 DIAGNOSIS — Z8249 Family history of ischemic heart disease and other diseases of the circulatory system: Secondary | ICD-10-CM

## 2018-02-01 DIAGNOSIS — N183 Chronic kidney disease, stage 3 unspecified: Secondary | ICD-10-CM

## 2018-02-01 DIAGNOSIS — R5383 Other fatigue: Secondary | ICD-10-CM

## 2018-02-01 DIAGNOSIS — Z7984 Long term (current) use of oral hypoglycemic drugs: Secondary | ICD-10-CM

## 2018-02-01 DIAGNOSIS — D631 Anemia in chronic kidney disease: Secondary | ICD-10-CM

## 2018-02-01 DIAGNOSIS — D509 Iron deficiency anemia, unspecified: Secondary | ICD-10-CM | POA: Diagnosis not present

## 2018-02-01 DIAGNOSIS — K219 Gastro-esophageal reflux disease without esophagitis: Secondary | ICD-10-CM

## 2018-02-01 DIAGNOSIS — I129 Hypertensive chronic kidney disease with stage 1 through stage 4 chronic kidney disease, or unspecified chronic kidney disease: Secondary | ICD-10-CM | POA: Diagnosis not present

## 2018-02-01 DIAGNOSIS — Z7982 Long term (current) use of aspirin: Secondary | ICD-10-CM | POA: Diagnosis not present

## 2018-02-01 NOTE — Progress Notes (Signed)
[facility] OFFICE PROGRESS NOTE  Patient Care Team: Gauger, Victoriano Lain, NP as PCP - General (Internal Medicine)  Cancer Staging No matching staging information was found for the patient.  # IRON DEF ANEMIA/ CKD [Dr.Lateef; Dr.Gittin] sep 2017- Stool/UA/Myeloma work up-NEG;colo- 2010 [per pt] Sep 2017- IV venofer; June 2018-  Aranesp; July 2019-stool occult test positive; [id number]- EGD/colo [Dr.Toledo-NEG]  # CKD-III [1.7]; Dr.Lateef   No history exists.    INTERVAL HISTORY:  Sara Bright 77 y.o.  female pleasant patient above history of Chronic anemia/chronic kidney disease; is here for follow-up.  Patient continues to be on iron pills.  Denies any constipation.  No nausea no vomiting.  Mild fatigue.  No blood in stools.  Mild to moderate fatigue.  Not any worse.  No swelling in the legs.  Review of Systems  Constitutional: Positive for malaise/fatigue. Negative for chills, diaphoresis, fever and weight loss.  HENT: Negative for nosebleeds and sore throat.   Eyes: Negative for double vision.  Respiratory: Negative for cough, hemoptysis, sputum production, shortness of breath and wheezing.   Cardiovascular: Negative for chest pain, palpitations, orthopnea and leg swelling.  Gastrointestinal: Negative for abdominal pain, blood in stool, constipation, diarrhea, heartburn, melena, nausea and vomiting.  Genitourinary: Negative for dysuria, frequency and urgency.  Musculoskeletal: Negative for back pain and joint pain.  Skin: Negative.  Negative for itching and rash.  Neurological: Negative for dizziness, tingling, focal weakness, weakness and headaches.  Endo/Heme/Allergies: Does not bruise/bleed easily.  Psychiatric/Behavioral: Negative for depression. The patient is not nervous/anxious and does not have insomnia.      PAST MEDICAL HISTORY :  Past Medical History:  Diagnosis Date  . Adenoma of colon   . Adenomatous colon polyp   . Anemia    IDA  .  Cataract   . Cervical stenosis of spinal canal   . Chronic kidney disease    stage 3  . Diabetes mellitus without complication ([place])   . GERD (gastroesophageal reflux disease)   . Glaucoma   . Heme positive stool   . History of UTI   . Hx of gallstones   . Hyperkalemia   . Hyperlipidemia   . Hyperlipidemia   . Hypertension   . Lumbar stenosis   . Lymphedema   . Neuromuscular disorder ([place])   . Neuropathy associated with endocrine disorder ([place])   . Obesity   . OSA on CPAP    on CPAP  . Osteoarthritis   . Osteoarthritis   . Retinopathy due to secondary diabetes ([place])   . Secondary hyperparathyroidism of renal origin [facility])     PAST SURGICAL HISTORY :   Past Surgical History:  Procedure Laterality Date  . ACDF X2    . BACK SURGERY    . Cataract extraction right    . catarect extraction left    . CHOLECYSTECTOMY    . COLONOSCOPY WITH PROPOFOL N/A 12/02/2016   Procedure: COLONOSCOPY WITH PROPOFOL;  Surgeon: Manya Silvas, MD;  Location: [facility] ENDOSCOPY;  Service: Endoscopy;  Laterality: N/A;  . COLONOSCOPY WITH PROPOFOL N/A 11/10/2017   Procedure: COLONOSCOPY WITH PROPOFOL;  Surgeon: Toledo, Benay Pike, MD;  Location: [facility] ENDOSCOPY;  Service: Gastroenterology;  Laterality: N/A;  . colonoscopy with removal lesions by snare    . Endoscoic carpal tunnel release    . ESOPHAGOGASTRODUODENOSCOPY N/A 11/10/2017   Procedure: ESOPHAGOGASTRODUODENOSCOPY (EGD);  Surgeon: Toledo, Benay Pike, MD;  Location: [facility] ENDOSCOPY;  Service: Gastroenterology;  Laterality: N/A;  . ESOPHAGOGASTRODUODENOSCOPY (EGD)  WITH PROPOFOL N/A 12/02/2016   Procedure: ESOPHAGOGASTRODUODENOSCOPY (EGD) WITH PROPOFOL;  Surgeon: Manya Silvas, MD;  Location: [facility] ENDOSCOPY;  Service: Endoscopy;  Laterality: N/A;  . IR IMAGING GUIDED PORT INSERTION  10/01/2017  . Laminectomy posterior lumbar facetectomy and formaninotomy w/Decomp    . Laminectony posterior cervicle decomp w/Facectomy and foraminotomy    . VEIN  LIGATION AND STRIPPING Left     FAMILY HISTORY :   Family History  Problem Relation Age of Onset  . Coronary artery disease Mother   . Diabetes type II Mother   . Hypertension Mother   . Tuberculosis Father   . Stroke Father   . Diabetes type II Sister   . Migraines Sister   . Alcohol abuse Brother   . Coronary artery disease Brother   . Diabetes type II Brother   . Kidney cancer Brother   . Kidney disease Brother   . Heart attack Brother   . Hypertension Brother     SOCIAL HISTORY:   Social History   Tobacco Use  . Smoking status: Never Smoker  . Smokeless tobacco: Never Used  Substance Use Topics  . Alcohol use: No  . Drug use: No    ALLERGIES:  is allergic to hydrocodone-acetaminophen and statins.  MEDICATIONS:  Current Outpatient Medications  Medication Sig Dispense Refill  . acetaminophen (TYLENOL) 500 MG tablet Take 500 mg by mouth every 6 (six) hours as needed.     Marland Kitchen amLODipine (NORVASC) 5 MG tablet Take 5 mg by mouth daily.     Marland Kitchen aspirin EC 81 MG tablet Take 81 mg by mouth daily.    . ASSURE COMFORT LANCETS 30G MISC     . baclofen (LIORESAL) 10 MG tablet Take 1 tablet (10 mg total) by mouth 3 (three) times daily. 90 tablet 1  . calcitRIOL (ROCALTROL) 0.25 MCG capsule Take 0.25 mcg by mouth 3 (three) times a week.     . carvedilol (COREG) 6.25 MG tablet Take 6.25 mg by mouth 2 (two) times daily.  12  . ferrous sulfate 325 (65 FE) MG tablet Take 325 mg by mouth daily.    . furosemide (LASIX) 40 MG tablet Take 40 mg by mouth daily.     Marland Kitchen gabapentin (NEURONTIN) 100 MG capsule Take 100 mg by mouth 2 (two) times daily.     Marland Kitchen gemfibrozil (LOPID) 600 MG tablet Take 600 mg by mouth daily.     Marland Kitchen glimepiride (AMARYL) 1 MG tablet Take 1 mg by mouth daily with breakfast.     . hydrALAZINE (APRESOLINE) 25 MG tablet Take 25 mg by mouth 3 (three) times daily.  12  . Lancet Devices (ADJUSTABLE LANCING DEVICE) MISC     . latanoprost (XALATAN) 0.005 % ophthalmic solution  Place 1 drop into both eyes.     Marland Kitchen lidocaine (LIDODERM) 5 % Place 1 patch onto the skin daily. Remove & Discard patch within 12 hours or as directed by MD 30 patch 0  . lidocaine-prilocaine (EMLA) cream Apply 1 application topically as needed. 30 g 3  . losartan (COZAAR) 50 MG tablet Take 100 mg by mouth daily.     . Multiple Vitamins-Minerals (CENTRUM SILVER) tablet Take 1 tablet by mouth daily.     . Omega-3 Fatty Acids (FISH OIL) 1000 MG CAPS Take 1,000 mg by mouth daily.     Marland Kitchen omeprazole (PRILOSEC) 20 MG capsule Take 20 mg by mouth daily.     . ONE TOUCH ULTRA TEST test strip     .  pioglitazone (ACTOS) 30 MG tablet Take 30 mg by mouth daily.     . timolol (TIMOPTIC-XR) 0.5 % ophthalmic gel-forming Place 1 drop into both eyes.     . traMADol (ULTRAM) 50 MG tablet Take 1 tablet (50 mg total) by mouth every 6 (six) hours as needed. 15 tablet 0   No current facility-administered medications for this visit.     PHYSICAL EXAMINATION:  BP (!) 147/72 (BP Location: Left Arm, Patient Position: Sitting)   Pulse 62   Temp 97.9 F (36.6 C) (Tympanic)   Resp 16   Wt 254 lb (115.2 kg)   BMI 43.60 kg/m   Filed Weights   02/01/18 0904  Weight: 254 lb (115.2 kg)    Physical Exam  Constitutional: She is oriented to person, place, and time.  Obese.  Walks with a cane.  She is alone.  HENT:  Head: Normocephalic and atraumatic.  Mouth/Throat: Oropharynx is clear and moist. No oropharyngeal exudate.  Eyes: Pupils are equal, round, and reactive to light.  Neck: Normal range of motion. Neck supple.  Cardiovascular: Normal rate and regular rhythm.  Pulmonary/Chest: No respiratory distress. She has no wheezes.  Abdominal: Soft. Bowel sounds are normal. She exhibits no distension and no mass. There is no tenderness. There is no rebound and no guarding.  Musculoskeletal: Normal range of motion. She exhibits no edema or tenderness.  Neurological: She is alert and oriented to person, place, and time.   Skin: Skin is warm.  Psychiatric: Affect normal.    LABORATORY DATA:  I have reviewed the data as listed    Component Value Date/Time   NA 141 10/01/2017 0810   NA 137 10/23/2012 2335   K 4.8 10/01/2017 0810   K 4.4 10/23/2012 2335   CL 110 10/01/2017 0810   CL 107 10/23/2012 2335   CO2 25 10/01/2017 0810   CO2 26 10/23/2012 2335   GLUCOSE 93 10/01/2017 0810   GLUCOSE 179 (H) 10/23/2012 2335   BUN 58 (H) 10/01/2017 0810   BUN 21 (H) 10/23/2012 2335   CREATININE 2.01 (H) 10/01/2017 0810   CREATININE 1.23 10/23/2012 2335   CALCIUM 9.7 10/01/2017 0810   CALCIUM 10.3 (H) 10/23/2012 2335   PROT 6.4 (L) 09/22/2017 1031   PROT 7.4 06/16/2011 1144   ALBUMIN 3.3 (L) 09/22/2017 1031   ALBUMIN 3.5 06/16/2011 1144   AST 20 09/22/2017 1031   AST 22 08/18/2011 1026   ALT 17 09/22/2017 1031   ALT 63 06/16/2011 1144   ALKPHOS 60 09/22/2017 1031   ALKPHOS 98 06/16/2011 1144   BILITOT 0.7 09/22/2017 1031   BILITOT 0.4 06/16/2011 1144   GFRNONAA 23 (L) 10/01/2017 0810   GFRNONAA 43 (L) 10/23/2012 2335   GFRAA 26 (L) 10/01/2017 0810   GFRAA 50 (L) 10/23/2012 2335    No results found for: SPEP, UPEP  Lab Results  Component Value Date   WBC 3.7 10/06/2017   NEUTROABS 2.1 10/06/2017   HGB 10.1 (L) 01/31/2018   HCT 32.9 (L) 01/31/2018   MCV 103.3 (H) 10/06/2017   PLT 186 10/06/2017      Chemistry      Component Value Date/Time   NA 141 10/01/2017 0810   NA 137 10/23/2012 2335   K 4.8 10/01/2017 0810   K 4.4 10/23/2012 2335   CL 110 10/01/2017 0810   CL 107 10/23/2012 2335   CO2 25 10/01/2017 0810   CO2 26 10/23/2012 2335   BUN 58 (H) 10/01/2017 0810  BUN 21 (H) 10/23/2012 2335   CREATININE 2.01 (H) 10/01/2017 0810   CREATININE 1.23 10/23/2012 2335      Component Value Date/Time   CALCIUM 9.7 10/01/2017 0810   CALCIUM 10.3 (H) 10/23/2012 2335   ALKPHOS 60 09/22/2017 1031   ALKPHOS 98 06/16/2011 1144   AST 20 09/22/2017 1031   AST 22 08/18/2011 1026   ALT 17  09/22/2017 1031   ALT 63 06/16/2011 1144   BILITOT 0.7 09/22/2017 1031   BILITOT 0.4 06/16/2011 1144     Results for Sara Bright, Sara Bright (MRN [id number]) as of 02/18/2017 10:08  Ref. Range 05/12/2016 08:53 09/01/2016 13:52 11/19/2016 08:42 12/22/2016 10:29 01/19/2017 10:20  Iron Latest Ref Range: 28 - 170 ug/dL 75 67 62 79 54  UIBC Latest Units: ug/dL 259 266 280 236 250  TIBC Latest Ref Range: 250 - 450 ug/dL 334 333 342 315 304  Saturation Ratios Latest Ref Range: 10.4 - 31.8 % 23 20 18 25 18   Ferritin Latest Ref Range: 11 - 307 ng/mL 651 (H) 467 (H) 413 (H) 633 (H) 374 (H)    RADIOGRAPHIC STUDIES: I have personally reviewed the radiological images as listed and agreed with the findings in the report. No results found.   ASSESSMENT & PLAN:  Anemia due to stage 3 chronic kidney disease # IDA/ CKD-hemoglobin is today's 10.1; STABLE. Continue  PO iron. HOLD off IV iron/Arnaesp.    # Will repeat iron studies/ferritin in 3 weeks ; if low then recommend IV Venofer.   # HTN-better controlled.  Continue monitor.  #Disposition:  # NO treatment today.  #FOLLOW UP in 3 weeks; MD/ labs- cbc/bmp/ferritin/iron studies- possible aranesp/IV venofer- Dr.B  CC: Dr.Lateef;   No orders of the defined types were placed in this encounter.  All questions were answered. The patient knows to call the clinic with any problems, questions or concerns.      Cammie Sickle, MD 02/01/2018 9:41 AM

## 2018-02-01 NOTE — Assessment & Plan Note (Addendum)
#   IDA/ CKD-hemoglobin is today's 10.1; STABLE. Continue  PO iron. HOLD off IV iron/Arnaesp.    # Will repeat iron studies/ferritin in 3 weeks ; if low then recommend IV Venofer.   # HTN-better controlled.  Continue monitor.  #Disposition:  # NO treatment today.  #FOLLOW UP in 3 weeks; MD/ labs- cbc/bmp/ferritin/iron studies- possible aranesp/IV venofer- Dr.B  CC: Dr.Lateef;

## 2018-02-08 DIAGNOSIS — M722 Plantar fascial fibromatosis: Secondary | ICD-10-CM | POA: Diagnosis not present

## 2018-02-08 DIAGNOSIS — E1140 Type 2 diabetes mellitus with diabetic neuropathy, unspecified: Secondary | ICD-10-CM | POA: Diagnosis not present

## 2018-02-08 DIAGNOSIS — B351 Tinea unguium: Secondary | ICD-10-CM | POA: Diagnosis not present

## 2018-02-19 DIAGNOSIS — G4733 Obstructive sleep apnea (adult) (pediatric): Secondary | ICD-10-CM | POA: Diagnosis not present

## 2018-02-21 ENCOUNTER — Inpatient Hospital Stay: Payer: Medicare HMO | Attending: Internal Medicine

## 2018-02-21 DIAGNOSIS — N183 Chronic kidney disease, stage 3 unspecified: Secondary | ICD-10-CM

## 2018-02-21 DIAGNOSIS — Z79899 Other long term (current) drug therapy: Secondary | ICD-10-CM | POA: Insufficient documentation

## 2018-02-21 DIAGNOSIS — D509 Iron deficiency anemia, unspecified: Secondary | ICD-10-CM | POA: Insufficient documentation

## 2018-02-21 DIAGNOSIS — Z7984 Long term (current) use of oral hypoglycemic drugs: Secondary | ICD-10-CM | POA: Insufficient documentation

## 2018-02-21 DIAGNOSIS — I129 Hypertensive chronic kidney disease with stage 1 through stage 4 chronic kidney disease, or unspecified chronic kidney disease: Secondary | ICD-10-CM | POA: Diagnosis not present

## 2018-02-21 DIAGNOSIS — K219 Gastro-esophageal reflux disease without esophagitis: Secondary | ICD-10-CM | POA: Diagnosis not present

## 2018-02-21 DIAGNOSIS — Z7982 Long term (current) use of aspirin: Secondary | ICD-10-CM | POA: Diagnosis not present

## 2018-02-21 DIAGNOSIS — E1122 Type 2 diabetes mellitus with diabetic chronic kidney disease: Secondary | ICD-10-CM | POA: Insufficient documentation

## 2018-02-21 DIAGNOSIS — D631 Anemia in chronic kidney disease: Secondary | ICD-10-CM

## 2018-02-21 DIAGNOSIS — R5383 Other fatigue: Secondary | ICD-10-CM | POA: Diagnosis not present

## 2018-02-21 DIAGNOSIS — R05 Cough: Secondary | ICD-10-CM | POA: Diagnosis not present

## 2018-02-21 LAB — HEMOGLOBIN: Hemoglobin: 9.7 g/dL — ABNORMAL LOW (ref 12.0–15.0)

## 2018-02-21 LAB — SAMPLE TO BLOOD BANK

## 2018-02-21 LAB — HEMATOCRIT: HEMATOCRIT: 31.5 % — AB (ref 36.0–46.0)

## 2018-02-22 ENCOUNTER — Inpatient Hospital Stay (HOSPITAL_BASED_OUTPATIENT_CLINIC_OR_DEPARTMENT_OTHER): Payer: Medicare HMO | Admitting: Internal Medicine

## 2018-02-22 ENCOUNTER — Inpatient Hospital Stay: Payer: Medicare HMO

## 2018-02-22 ENCOUNTER — Other Ambulatory Visit: Payer: Self-pay

## 2018-02-22 ENCOUNTER — Encounter: Payer: Self-pay | Admitting: Internal Medicine

## 2018-02-22 VITALS — BP 164/67 | HR 75 | Temp 97.6°F | Resp 22 | Ht 64.0 in | Wt 249.1 lb

## 2018-02-22 DIAGNOSIS — K219 Gastro-esophageal reflux disease without esophagitis: Secondary | ICD-10-CM | POA: Diagnosis not present

## 2018-02-22 DIAGNOSIS — Z7982 Long term (current) use of aspirin: Secondary | ICD-10-CM

## 2018-02-22 DIAGNOSIS — D509 Iron deficiency anemia, unspecified: Secondary | ICD-10-CM

## 2018-02-22 DIAGNOSIS — R05 Cough: Secondary | ICD-10-CM | POA: Diagnosis not present

## 2018-02-22 DIAGNOSIS — R5383 Other fatigue: Secondary | ICD-10-CM | POA: Diagnosis not present

## 2018-02-22 DIAGNOSIS — I129 Hypertensive chronic kidney disease with stage 1 through stage 4 chronic kidney disease, or unspecified chronic kidney disease: Secondary | ICD-10-CM

## 2018-02-22 DIAGNOSIS — Z7984 Long term (current) use of oral hypoglycemic drugs: Secondary | ICD-10-CM | POA: Diagnosis not present

## 2018-02-22 DIAGNOSIS — E1122 Type 2 diabetes mellitus with diabetic chronic kidney disease: Secondary | ICD-10-CM | POA: Diagnosis not present

## 2018-02-22 DIAGNOSIS — D631 Anemia in chronic kidney disease: Secondary | ICD-10-CM | POA: Diagnosis not present

## 2018-02-22 DIAGNOSIS — N183 Chronic kidney disease, stage 3 unspecified: Secondary | ICD-10-CM

## 2018-02-22 DIAGNOSIS — Z79899 Other long term (current) drug therapy: Secondary | ICD-10-CM

## 2018-02-22 MED ORDER — HYDROCOD POLST-CPM POLST ER 10-8 MG/5ML PO SUER: 5.0000 mL | Freq: Every evening | ORAL | 0 refills | Status: DC | PRN

## 2018-02-22 NOTE — Progress Notes (Signed)
[place] OFFICE PROGRESS NOTE  Patient Care Team: Gauger, Victoriano Lain, NP as PCP - General (Internal Medicine)  Cancer Staging No matching staging information was found for the patient.  # IRON DEF ANEMIA/ CKD [Dr.Lateef; Dr.Gittin] sep 2017- Stool/UA/Myeloma work up-NEG;colo- 2010 [per pt] Sep 2017- IV venofer; June 2018-  Aranesp; July 2019-stool occult test positive; [id number]- EGD/colo [Dr.Toledo-NEG]  # CKD-III [1.7]; Dr.Lateef   No history exists.    INTERVAL HISTORY:  Sara Bright 77 y.o.  female pleasant patient above history of Chronic anemia/chronic kidney disease; is here for follow-up.  Patient admits episode of cough with wheezing over the last few days.  Patient has been taking Mucinex D.  Cough is worse over nighttime.  Denies any worsening shortness of breath or fevers or chills.  Continues to have mild to moderate fatigue.  Not any worse.  No swelling in the legs.  Review of Systems  Constitutional: Positive for malaise/fatigue. Negative for chills, diaphoresis, fever and weight loss.  HENT: Negative for nosebleeds and sore throat.   Eyes: Negative for double vision.  Respiratory: Negative for cough, hemoptysis, sputum production, shortness of breath and wheezing.   Cardiovascular: Negative for chest pain, palpitations, orthopnea and leg swelling.  Gastrointestinal: Negative for abdominal pain, blood in stool, constipation, diarrhea, heartburn, melena, nausea and vomiting.  Genitourinary: Negative for dysuria, frequency and urgency.  Musculoskeletal: Negative for back pain and joint pain.  Skin: Negative.  Negative for itching and rash.  Neurological: Negative for dizziness, tingling, focal weakness, weakness and headaches.  Endo/Heme/Allergies: Does not bruise/bleed easily.  Psychiatric/Behavioral: Negative for depression. The patient is not nervous/anxious and does not have insomnia.      PAST MEDICAL HISTORY :  Past Medical History:   Diagnosis Date  . Adenoma of colon   . Adenomatous colon polyp   . Anemia    IDA  . Cataract   . Cervical stenosis of spinal canal   . Chronic kidney disease    stage 3  . Diabetes mellitus without complication ([place])   . GERD (gastroesophageal reflux disease)   . Glaucoma   . Heme positive stool   . History of UTI   . Hx of gallstones   . Hyperkalemia   . Hyperlipidemia   . Hyperlipidemia   . Hypertension   . Lumbar stenosis   . Lymphedema   . Neuromuscular disorder ([place])   . Neuropathy associated with endocrine disorder ([place])   . Obesity   . OSA on CPAP    on CPAP  . Osteoarthritis   . Osteoarthritis   . Retinopathy due to secondary diabetes ([place])   . Secondary hyperparathyroidism of renal origin [facility])     PAST SURGICAL HISTORY :   Past Surgical History:  Procedure Laterality Date  . ACDF X2    . BACK SURGERY    . Cataract extraction right    . catarect extraction left    . CHOLECYSTECTOMY    . COLONOSCOPY WITH PROPOFOL N/A 12/02/2016   Procedure: COLONOSCOPY WITH PROPOFOL;  Surgeon: Manya Silvas, MD;  Location: [facility] ENDOSCOPY;  Service: Endoscopy;  Laterality: N/A;  . COLONOSCOPY WITH PROPOFOL N/A 11/10/2017   Procedure: COLONOSCOPY WITH PROPOFOL;  Surgeon: Toledo, Benay Pike, MD;  Location: [facility] ENDOSCOPY;  Service: Gastroenterology;  Laterality: N/A;  . colonoscopy with removal lesions by snare    . Endoscoic carpal tunnel release    . ESOPHAGOGASTRODUODENOSCOPY N/A 11/10/2017   Procedure: ESOPHAGOGASTRODUODENOSCOPY (EGD);  Surgeon: Kempton, Stanton,  MD;  Location: [facility] ENDOSCOPY;  Service: Gastroenterology;  Laterality: N/A;  . ESOPHAGOGASTRODUODENOSCOPY (EGD) WITH PROPOFOL N/A 12/02/2016   Procedure: ESOPHAGOGASTRODUODENOSCOPY (EGD) WITH PROPOFOL;  Surgeon: Manya Silvas, MD;  Location: [facility] ENDOSCOPY;  Service: Endoscopy;  Laterality: N/A;  . IR IMAGING GUIDED PORT INSERTION  10/01/2017  . Laminectomy posterior lumbar facetectomy and formaninotomy  w/Decomp    . Laminectony posterior cervicle decomp w/Facectomy and foraminotomy    . VEIN LIGATION AND STRIPPING Left     FAMILY HISTORY :   Family History  Problem Relation Age of Onset  . Coronary artery disease Mother   . Diabetes type II Mother   . Hypertension Mother   . Tuberculosis Father   . Stroke Father   . Diabetes type II Sister   . Migraines Sister   . Alcohol abuse Brother   . Coronary artery disease Brother   . Diabetes type II Brother   . Kidney cancer Brother   . Kidney disease Brother   . Heart attack Brother   . Hypertension Brother     SOCIAL HISTORY:   Social History   Tobacco Use  . Smoking status: Never Smoker  . Smokeless tobacco: Never Used  Substance Use Topics  . Alcohol use: No  . Drug use: No    ALLERGIES:  is allergic to hydrocodone-acetaminophen and statins.  MEDICATIONS:  Current Outpatient Medications  Medication Sig Dispense Refill  . acetaminophen (TYLENOL) 500 MG tablet Take 500 mg by mouth every 6 (six) hours as needed.     Marland Kitchen amLODipine (NORVASC) 5 MG tablet Take 5 mg by mouth daily.     Marland Kitchen aspirin EC 81 MG tablet Take 81 mg by mouth daily.    . ASSURE COMFORT LANCETS 30G MISC     . baclofen (LIORESAL) 10 MG tablet Take 1 tablet (10 mg total) by mouth 3 (three) times daily. 90 tablet 1  . calcitRIOL (ROCALTROL) 0.25 MCG capsule Take 0.25 mcg by mouth 3 (three) times a week.     . carvedilol (COREG) 6.25 MG tablet Take 6.25 mg by mouth 2 (two) times daily.  12  . ferrous sulfate 325 (65 FE) MG tablet Take 325 mg by mouth daily.    . furosemide (LASIX) 40 MG tablet Take 40 mg by mouth daily.     Marland Kitchen gabapentin (NEURONTIN) 100 MG capsule Take 100 mg by mouth 2 (two) times daily.     Marland Kitchen gemfibrozil (LOPID) 600 MG tablet Take 600 mg by mouth daily.     Marland Kitchen glimepiride (AMARYL) 1 MG tablet Take 1 mg by mouth daily with breakfast.     . hydrALAZINE (APRESOLINE) 25 MG tablet Take 25 mg by mouth 3 (three) times daily.  12  . Lancet Devices  (ADJUSTABLE LANCING DEVICE) MISC     . latanoprost (XALATAN) 0.005 % ophthalmic solution Place 1 drop into both eyes.     Marland Kitchen lidocaine (LIDODERM) 5 % Place 1 patch onto the skin daily. Remove & Discard patch within 12 hours or as directed by MD 30 patch 0  . lidocaine-prilocaine (EMLA) cream Apply 1 application topically as needed. 30 g 3  . losartan (COZAAR) 50 MG tablet Take 100 mg by mouth daily.     . Multiple Vitamins-Minerals (CENTRUM SILVER) tablet Take 1 tablet by mouth daily.     . Omega-3 Fatty Acids (FISH OIL) 1000 MG CAPS Take 1,000 mg by mouth daily.     Marland Kitchen omeprazole (PRILOSEC) 20 MG capsule Take 20  mg by mouth daily.     . ONE TOUCH ULTRA TEST test strip     . pioglitazone (ACTOS) 30 MG tablet Take 30 mg by mouth daily.     . pseudoephedrine-guaifenesin (MUCINEX D) 60-600 MG 12 hr tablet Take 1 tablet by mouth every 12 (twelve) hours.    . timolol (TIMOPTIC-XR) 0.5 % ophthalmic gel-forming Place 1 drop into both eyes.     . traMADol (ULTRAM) 50 MG tablet Take 1 tablet (50 mg total) by mouth every 6 (six) hours as needed. 15 tablet 0  . chlorpheniramine-HYDROcodone (TUSSIONEX) 10-8 MG/5ML SUER Take 5 mLs by mouth at bedtime as needed for cough. 70 mL 0   No current facility-administered medications for this visit.     PHYSICAL EXAMINATION:  BP (!) 164/67 (BP Location: Right Arm, Patient Position: Sitting)   Pulse 75   Temp 97.6 F (36.4 C) (Tympanic)   Resp (!) 22   Ht 5\' 4"  (1.626 m)   Wt 249 lb 1.9 oz (113 kg)   BMI 42.76 kg/m   Filed Weights   02/22/18 1333  Weight: 249 lb 1.9 oz (113 kg)    Physical Exam  Constitutional: She is oriented to person, place, and time.  Obese.  Walks with a cane.  She is alone.  HENT:  Head: Normocephalic and atraumatic.  Mouth/Throat: Oropharynx is clear and moist. No oropharyngeal exudate.  Eyes: Pupils are equal, round, and reactive to light.  Neck: Normal range of motion. Neck supple.  Cardiovascular: Normal rate and regular  rhythm.  Pulmonary/Chest: No respiratory distress. She has no wheezes.  Abdominal: Soft. Bowel sounds are normal. She exhibits no distension and no mass. There is no abdominal tenderness. There is no rebound and no guarding.  Musculoskeletal: Normal range of motion.        General: No tenderness or edema.  Neurological: She is alert and oriented to person, place, and time.  Skin: Skin is warm.  Psychiatric: Affect normal.    LABORATORY DATA:  I have reviewed the data as listed    Component Value Date/Time   NA 141 10/01/2017 0810   NA 137 10/23/2012 2335   K 4.8 10/01/2017 0810   K 4.4 10/23/2012 2335   CL 110 10/01/2017 0810   CL 107 10/23/2012 2335   CO2 25 10/01/2017 0810   CO2 26 10/23/2012 2335   GLUCOSE 93 10/01/2017 0810   GLUCOSE 179 (H) 10/23/2012 2335   BUN 58 (H) 10/01/2017 0810   BUN 21 (H) 10/23/2012 2335   CREATININE 2.01 (H) 10/01/2017 0810   CREATININE 1.23 10/23/2012 2335   CALCIUM 9.7 10/01/2017 0810   CALCIUM 10.3 (H) 10/23/2012 2335   PROT 6.4 (L) 09/22/2017 1031   PROT 7.4 06/16/2011 1144   ALBUMIN 3.3 (L) 09/22/2017 1031   ALBUMIN 3.5 06/16/2011 1144   AST 20 09/22/2017 1031   AST 22 08/18/2011 1026   ALT 17 09/22/2017 1031   ALT 63 06/16/2011 1144   ALKPHOS 60 09/22/2017 1031   ALKPHOS 98 06/16/2011 1144   BILITOT 0.7 09/22/2017 1031   BILITOT 0.4 06/16/2011 1144   GFRNONAA 23 (L) 10/01/2017 0810   GFRNONAA 43 (L) 10/23/2012 2335   GFRAA 26 (L) 10/01/2017 0810   GFRAA 50 (L) 10/23/2012 2335    No results found for: SPEP, UPEP  Lab Results  Component Value Date   WBC 3.7 10/06/2017   NEUTROABS 2.1 10/06/2017   HGB 9.7 (L) 02/21/2018   HCT 31.5 (L) 02/21/2018  MCV 103.3 (H) 10/06/2017   PLT 186 10/06/2017      Chemistry      Component Value Date/Time   NA 141 10/01/2017 0810   NA 137 10/23/2012 2335   K 4.8 10/01/2017 0810   K 4.4 10/23/2012 2335   CL 110 10/01/2017 0810   CL 107 10/23/2012 2335   CO2 25 10/01/2017 0810   CO2  26 10/23/2012 2335   BUN 58 (H) 10/01/2017 0810   BUN 21 (H) 10/23/2012 2335   CREATININE 2.01 (H) 10/01/2017 0810   CREATININE 1.23 10/23/2012 2335      Component Value Date/Time   CALCIUM 9.7 10/01/2017 0810   CALCIUM 10.3 (H) 10/23/2012 2335   ALKPHOS 60 09/22/2017 1031   ALKPHOS 98 06/16/2011 1144   AST 20 09/22/2017 1031   AST 22 08/18/2011 1026   ALT 17 09/22/2017 1031   ALT 63 06/16/2011 1144   BILITOT 0.7 09/22/2017 1031   BILITOT 0.4 06/16/2011 1144     Results for MAIE, KESINGER (MRN [id number]) as of 02/18/2017 10:08  Ref. Range 05/12/2016 08:53 09/01/2016 13:52 11/19/2016 08:42 12/22/2016 10:29 01/19/2017 10:20  Iron Latest Ref Range: 28 - 170 ug/dL 75 67 62 79 54  UIBC Latest Units: ug/dL 259 266 280 236 250  TIBC Latest Ref Range: 250 - 450 ug/dL 334 333 342 315 304  Saturation Ratios Latest Ref Range: 10.4 - 31.8 % 23 20 18 25 18   Ferritin Latest Ref Range: 11 - 307 ng/mL 651 (H) 467 (H) 413 (H) 633 (H) 374 (H)    RADIOGRAPHIC STUDIES: I have personally reviewed the radiological images as listed and agreed with the findings in the report. No results found.   ASSESSMENT & PLAN:  Anemia due to stage 3 chronic kidney disease # IDA/ CKD-hemoglobin is today's 9.7.  STABLE. Continue  PO iron. HOLD off IV iron/Arnaesp-poorly controlled blood pressure.   # Cough/phlegm/dyspnea/wheezing- ? Acute brconhitis- awaiting pul with Dr.Fleming.  Currently no wheezing.  Recommend Mucinex only discontinue Mucinex D.  Prescription for Tussionex given.  If worse would recommend a chest x-ray patient will call.  # HTN-systolic 676H; question secondary to Mucinex D.  Recommend complaince;   Continue monitor.  # NO aranesp today # cbc/bmp/iron studies/ferritin; aranesp in 1 week # Follow up in 5 weeks-MD aranesp; h&H-Dr.B  CC: Dr.Lateef;   Orders Placed This Encounter  Procedures  . CBC with Differential/Platelet    Standing Status:   Future    Standing Expiration Date:    02/23/2019  . Basic metabolic panel    Standing Status:   Future    Standing Expiration Date:   02/23/2019  . Iron and TIBC    Standing Status:   Future    Standing Expiration Date:   02/23/2019  . Ferritin    Standing Status:   Future    Standing Expiration Date:   02/23/2019  . CBC with Differential/Platelet    Standing Status:   Future    Standing Expiration Date:   02/23/2019  . Comprehensive metabolic panel    Standing Status:   Future    Standing Expiration Date:   02/23/2019  . Iron and TIBC    Standing Status:   Future    Standing Expiration Date:   02/23/2019  . Ferritin    Standing Status:   Future    Standing Expiration Date:   02/23/2019   All questions were answered. The patient knows to call the clinic with any problems,  questions or concerns.      Cammie Sickle, MD 02/22/2018 3:18 PM

## 2018-02-22 NOTE — Patient Instructions (Signed)
#  Avoid Mucinex-D Lilian Coma make your blood pressure go up]; okay to take plain Mucinex.  #Try cough medication as recommended.

## 2018-02-22 NOTE — Assessment & Plan Note (Addendum)
#   IDA/ CKD-hemoglobin is today's 9.7.  STABLE. Continue  PO iron. HOLD off IV iron/Arnaesp-poorly controlled blood pressure.   # Cough/phlegm/dyspnea/wheezing- ? Acute brconhitis- awaiting pul with Dr.Fleming.  Currently no wheezing.  Recommend Mucinex only discontinue Mucinex D.  Prescription for Tussionex given.  If worse would recommend a chest x-ray patient will call.  # HTN-systolic 352Y; question secondary to Mucinex D.  Recommend complaince;   Continue monitor.  # NO aranesp today # cbc/bmp/iron studies/ferritin; aranesp in 1 week # Follow up in 5 weeks-MD aranesp; h&H-Dr.B  CC: Dr.Lateef;

## 2018-02-24 DIAGNOSIS — G4733 Obstructive sleep apnea (adult) (pediatric): Secondary | ICD-10-CM | POA: Diagnosis not present

## 2018-02-24 DIAGNOSIS — R0609 Other forms of dyspnea: Secondary | ICD-10-CM | POA: Diagnosis not present

## 2018-02-24 DIAGNOSIS — Z9989 Dependence on other enabling machines and devices: Secondary | ICD-10-CM | POA: Diagnosis not present

## 2018-02-24 DIAGNOSIS — J4520 Mild intermittent asthma, uncomplicated: Secondary | ICD-10-CM | POA: Diagnosis not present

## 2018-02-25 ENCOUNTER — Encounter: Payer: Self-pay | Admitting: *Deleted

## 2018-02-25 ENCOUNTER — Telehealth: Payer: Self-pay | Admitting: *Deleted

## 2018-02-25 NOTE — Telephone Encounter (Signed)
Pa SUBMITTED FOR PT'S EMLA CREAM FOR Sara Bright (Key: [id number])- PENDING INSURANCE APPROVAL

## 2018-02-28 NOTE — Telephone Encounter (Signed)
PA Response - Approved for EMLA

## 2018-03-01 ENCOUNTER — Inpatient Hospital Stay: Payer: Medicare HMO

## 2018-03-01 ENCOUNTER — Other Ambulatory Visit: Payer: Self-pay | Admitting: *Deleted

## 2018-03-01 VITALS — BP 144/70 | HR 83 | Temp 98.6°F | Resp 20

## 2018-03-01 DIAGNOSIS — D631 Anemia in chronic kidney disease: Secondary | ICD-10-CM

## 2018-03-01 DIAGNOSIS — N183 Chronic kidney disease, stage 3 unspecified: Secondary | ICD-10-CM

## 2018-03-01 LAB — BASIC METABOLIC PANEL
Anion gap: 10 (ref 5–15)
BUN: 62 mg/dL — ABNORMAL HIGH (ref 8–23)
CO2: 22 mmol/L (ref 22–32)
CREATININE: 2.08 mg/dL — AB (ref 0.44–1.00)
Calcium: 10.1 mg/dL (ref 8.9–10.3)
Chloride: 106 mmol/L (ref 98–111)
GFR calc Af Amer: 26 mL/min — ABNORMAL LOW (ref >60)
GFR calc non Af Amer: 22 mL/min — ABNORMAL LOW (ref >60)
Glucose, Bld: 135 mg/dL — ABNORMAL HIGH (ref 70–99)
Potassium: 4.2 mmol/L (ref 3.5–5.1)
Sodium: 138 mmol/L (ref 135–145)

## 2018-03-01 LAB — CBC WITH DIFFERENTIAL/PLATELET
Abs Immature Granulocytes: 0.01 10*3/uL (ref 0.00–0.07)
BASOS PCT: 1 %
Basophils Absolute: 0.1 10*3/uL (ref 0.0–0.1)
Eosinophils Absolute: 0.1 10*3/uL (ref 0.0–0.5)
Eosinophils Relative: 3 %
HCT: 28.2 % — ABNORMAL LOW (ref 36.0–46.0)
Hemoglobin: 8.8 g/dL — ABNORMAL LOW (ref 12.0–15.0)
Immature Granulocytes: 0 %
Lymphocytes Relative: 31 %
Lymphs Abs: 1.6 10*3/uL (ref 0.7–4.0)
MCH: 31.1 pg (ref 26.0–34.0)
MCHC: 31.2 g/dL (ref 30.0–36.0)
MCV: 99.6 fL (ref 80.0–100.0)
Monocytes Absolute: 0.5 10*3/uL (ref 0.1–1.0)
Monocytes Relative: 10 %
Neutro Abs: 2.9 10*3/uL (ref 1.7–7.7)
Neutrophils Relative %: 55 %
PLATELETS: 158 10*3/uL (ref 150–400)
RBC: 2.83 MIL/uL — ABNORMAL LOW (ref 3.87–5.11)
RDW: 17.1 % — ABNORMAL HIGH (ref 11.5–15.5)
WBC: 5.2 10*3/uL (ref 4.0–10.5)
nRBC: 0 % (ref 0.0–0.2)

## 2018-03-01 LAB — IRON AND TIBC
Iron: 91 ug/dL (ref 28–170)
Saturation Ratios: 29 % (ref 10.4–31.8)
TIBC: 314 ug/dL (ref 250–450)
UIBC: 223 ug/dL

## 2018-03-01 LAB — FERRITIN: Ferritin: 373 ng/mL — ABNORMAL HIGH (ref 11–307)

## 2018-03-01 MED ORDER — SODIUM CHLORIDE 0.9 % IV SOLN: 200.0000 mg | Freq: Once | INTRAVENOUS | Status: DC

## 2018-03-01 MED ORDER — SODIUM CHLORIDE 0.9% FLUSH
10.0000 mL | INTRAVENOUS | Status: DC | PRN
  Administered 2018-03-01: 10 mL
  Filled 2018-03-01: qty 10

## 2018-03-01 MED ORDER — IRON SUCROSE 20 MG/ML IV SOLN
200.0000 mg | Freq: Once | INTRAVENOUS | Status: AC
  Administered 2018-03-01: 200 mg via INTRAVENOUS
  Filled 2018-03-01: qty 10

## 2018-03-01 MED ORDER — HEPARIN SOD (PORK) LOCK FLUSH 100 UNIT/ML IV SOLN
500.0000 [IU] | Freq: Once | INTRAVENOUS | Status: AC | PRN
  Administered 2018-03-01: 500 [IU]
  Filled 2018-03-01: qty 5

## 2018-03-01 MED ORDER — SODIUM CHLORIDE 0.9 % IV SOLN
Freq: Once | INTRAVENOUS | Status: AC
  Administered 2018-03-01: 09:00:00 via INTRAVENOUS
  Filled 2018-03-01: qty 250

## 2018-03-04 DIAGNOSIS — G4733 Obstructive sleep apnea (adult) (pediatric): Secondary | ICD-10-CM | POA: Diagnosis not present

## 2018-03-08 ENCOUNTER — Inpatient Hospital Stay: Payer: Medicare HMO

## 2018-03-08 VITALS — BP 123/68 | HR 71 | Resp 20

## 2018-03-08 DIAGNOSIS — D631 Anemia in chronic kidney disease: Secondary | ICD-10-CM

## 2018-03-08 DIAGNOSIS — N183 Chronic kidney disease, stage 3 unspecified: Secondary | ICD-10-CM

## 2018-03-08 LAB — SAMPLE TO BLOOD BANK

## 2018-03-08 LAB — HEMATOCRIT: HCT: 28.1 % — ABNORMAL LOW (ref 36.0–46.0)

## 2018-03-08 LAB — HEMOGLOBIN: Hemoglobin: 8.7 g/dL — ABNORMAL LOW (ref 12.0–15.0)

## 2018-03-08 MED ORDER — DARBEPOETIN ALFA 300 MCG/0.6ML IJ SOSY
300.0000 ug | PREFILLED_SYRINGE | Freq: Once | INTRAMUSCULAR | Status: AC
  Administered 2018-03-08: 300 ug via SUBCUTANEOUS

## 2018-03-08 NOTE — Patient Instructions (Signed)

## 2018-03-29 ENCOUNTER — Inpatient Hospital Stay: Payer: Medicare HMO

## 2018-03-29 ENCOUNTER — Inpatient Hospital Stay: Payer: Medicare HMO | Attending: Internal Medicine | Admitting: Internal Medicine

## 2018-03-29 ENCOUNTER — Ambulatory Visit: Payer: Medicare HMO

## 2018-03-29 VITALS — BP 170/90 | HR 74 | Temp 97.7°F | Resp 18

## 2018-03-29 DIAGNOSIS — D631 Anemia in chronic kidney disease: Secondary | ICD-10-CM

## 2018-03-29 DIAGNOSIS — N183 Chronic kidney disease, stage 3 unspecified: Secondary | ICD-10-CM

## 2018-03-29 DIAGNOSIS — E1122 Type 2 diabetes mellitus with diabetic chronic kidney disease: Secondary | ICD-10-CM | POA: Insufficient documentation

## 2018-03-29 DIAGNOSIS — I129 Hypertensive chronic kidney disease with stage 1 through stage 4 chronic kidney disease, or unspecified chronic kidney disease: Secondary | ICD-10-CM | POA: Diagnosis not present

## 2018-03-29 DIAGNOSIS — R5383 Other fatigue: Secondary | ICD-10-CM

## 2018-03-29 DIAGNOSIS — Z7982 Long term (current) use of aspirin: Secondary | ICD-10-CM | POA: Insufficient documentation

## 2018-03-29 DIAGNOSIS — Z7984 Long term (current) use of oral hypoglycemic drugs: Secondary | ICD-10-CM

## 2018-03-29 DIAGNOSIS — Z79899 Other long term (current) drug therapy: Secondary | ICD-10-CM | POA: Diagnosis not present

## 2018-03-29 DIAGNOSIS — D509 Iron deficiency anemia, unspecified: Secondary | ICD-10-CM | POA: Insufficient documentation

## 2018-03-29 LAB — COMPREHENSIVE METABOLIC PANEL
ALT: 17 U/L (ref 0–44)
AST: 24 U/L (ref 15–41)
Albumin: 3.7 g/dL (ref 3.5–5.0)
Alkaline Phosphatase: 72 U/L (ref 38–126)
Anion gap: 9 (ref 5–15)
BILIRUBIN TOTAL: 0.6 mg/dL (ref 0.3–1.2)
BUN: 34 mg/dL — ABNORMAL HIGH (ref 8–23)
CO2: 23 mmol/L (ref 22–32)
Calcium: 10.2 mg/dL (ref 8.9–10.3)
Chloride: 108 mmol/L (ref 98–111)
Creatinine, Ser: 1.45 mg/dL — ABNORMAL HIGH (ref 0.44–1.00)
GFR calc Af Amer: 40 mL/min — ABNORMAL LOW (ref >60)
GFR calc non Af Amer: 34 mL/min — ABNORMAL LOW (ref >60)
Glucose, Bld: 158 mg/dL — ABNORMAL HIGH (ref 70–99)
Potassium: 4.9 mmol/L (ref 3.5–5.1)
Sodium: 140 mmol/L (ref 135–145)
TOTAL PROTEIN: 7.1 g/dL (ref 6.5–8.1)

## 2018-03-29 LAB — CBC WITH DIFFERENTIAL/PLATELET
Abs Immature Granulocytes: 0.01 10*3/uL (ref 0.00–0.07)
Basophils Absolute: 0.1 10*3/uL (ref 0.0–0.1)
Basophils Relative: 2 %
EOS PCT: 3 %
Eosinophils Absolute: 0.1 10*3/uL (ref 0.0–0.5)
HCT: 33.7 % — ABNORMAL LOW (ref 36.0–46.0)
Hemoglobin: 10.4 g/dL — ABNORMAL LOW (ref 12.0–15.0)
Immature Granulocytes: 0 %
Lymphocytes Relative: 30 %
Lymphs Abs: 1 10*3/uL (ref 0.7–4.0)
MCH: 33.7 pg (ref 26.0–34.0)
MCHC: 30.9 g/dL (ref 30.0–36.0)
MCV: 109.1 fL — ABNORMAL HIGH (ref 80.0–100.0)
Monocytes Absolute: 0.4 10*3/uL (ref 0.1–1.0)
Monocytes Relative: 13 %
Neutro Abs: 1.7 10*3/uL (ref 1.7–7.7)
Neutrophils Relative %: 52 %
Platelets: 154 10*3/uL (ref 150–400)
RBC: 3.09 MIL/uL — ABNORMAL LOW (ref 3.87–5.11)
RDW: 17.3 % — ABNORMAL HIGH (ref 11.5–15.5)
WBC: 3.2 10*3/uL — ABNORMAL LOW (ref 4.0–10.5)
nRBC: 0 % (ref 0.0–0.2)

## 2018-03-29 LAB — IRON AND TIBC
Iron: 75 ug/dL (ref 28–170)
Saturation Ratios: 22 % (ref 10.4–31.8)
TIBC: 335 ug/dL (ref 250–450)
UIBC: 260 ug/dL

## 2018-03-29 LAB — SAMPLE TO BLOOD BANK

## 2018-03-29 LAB — FERRITIN: Ferritin: 289 ng/mL (ref 11–307)

## 2018-03-29 NOTE — Progress Notes (Signed)
Sara Bright  Patient Care Team: Gauger, Victoriano Lain, NP as PCP - General (Internal Medicine)  Cancer Staging No matching staging information was found for the patient.  # IRON DEF ANEMIA/ CKD [Dr.Lateef; Dr.Gittin] sep 2017- Stool/UA/Myeloma work up-NEG;colo- 2010 [per pt] Sep 2017- IV venofer; June 2018-  Aranesp; July 2019-stool occult test positive; [id number]- EGD/colo [Dr.Toledo-NEG]  # CKD-III [1.7]; Dr.Lateef   No history exists.    INTERVAL HISTORY:  Sara Bright 78 y.o.  female pleasant patient above history of Chronic anemia/chronic kidney disease; is here for follow-up.  Patient denies any worsening shortness of breath or cough.  Cough and wheezing has improved.  Complains of mild fatigue.  Denies any swelling in the legs.   Review of Systems  Constitutional: Positive for malaise/fatigue. Negative for chills, diaphoresis, fever and weight loss.  HENT: Negative for nosebleeds and sore throat.   Eyes: Negative for double vision.  Respiratory: Positive for shortness of breath (On exertion.). Negative for cough, hemoptysis, sputum production and wheezing.   Cardiovascular: Negative for chest pain, palpitations, orthopnea and leg swelling.  Gastrointestinal: Negative for abdominal pain, blood in stool, constipation, diarrhea, heartburn, melena, nausea and vomiting.  Genitourinary: Negative for dysuria, frequency and urgency.  Musculoskeletal: Negative for back pain and joint pain.  Skin: Negative.  Negative for itching and rash.  Neurological: Negative for dizziness, tingling, focal weakness, weakness and headaches.  Endo/Heme/Allergies: Does not bruise/bleed easily.  Psychiatric/Behavioral: Negative for depression. The patient is not nervous/anxious and does not have insomnia.      PAST MEDICAL HISTORY :  Past Medical History:  Diagnosis Date  . Adenoma of colon   . Adenomatous colon polyp   . Anemia    IDA  . Cataract   .  Cervical stenosis of spinal canal   . Chronic kidney disease    stage 3  . Diabetes mellitus without complication ([place])   . GERD (gastroesophageal reflux disease)   . Glaucoma   . Heme positive stool   . History of UTI   . Hx of gallstones   . Hyperkalemia   . Hyperlipidemia   . Hyperlipidemia   . Hypertension   . Lumbar stenosis   . Lymphedema   . Neuromuscular disorder ([place])   . Neuropathy associated with endocrine disorder ([place])   . Obesity   . OSA on CPAP    on CPAP  . Osteoarthritis   . Osteoarthritis   . Port-A-Cath in place   . Retinopathy due to secondary diabetes ([place])   . Secondary hyperparathyroidism of renal origin [facility])     PAST SURGICAL HISTORY :   Past Surgical History:  Procedure Laterality Date  . ACDF X2    . BACK SURGERY    . Cataract extraction right    . catarect extraction left    . CHOLECYSTECTOMY    . COLONOSCOPY WITH PROPOFOL N/A 12/02/2016   Procedure: COLONOSCOPY WITH PROPOFOL;  Surgeon: Manya Silvas, MD;  Location[facility] ENDOSCOPY;  Service: Endoscopy;  Laterality: N/A;  . COLONOSCOPY WITH PROPOFOL N/A 11/10/2017   Procedure: COLONOSCOPY WITH PROPOFOL;  Surgeon: Toledo, Benay Pike, MD;  Location: [facility] ENDOSCOPY;  Service: Gastroenterology;  Laterality: N/A;  . colonoscopy with removal lesions by snare    . Endoscoic carpal tunnel release    . ESOPHAGOGASTRODUODENOSCOPY N/A 11/10/2017   Procedure: ESOPHAGOGASTRODUODENOSCOPY (EGD);  Surgeon: Toledo, Benay Pike, MD;  Location: [facility] ENDOSCOPY;  Service: Gastroenterology;  Laterality: N/A;  . ESOPHAGOGASTRODUODENOSCOPY (EGD) WITH  PROPOFOL N/A 12/02/2016   Procedure: ESOPHAGOGASTRODUODENOSCOPY (EGD) WITH PROPOFOL;  Surgeon: Manya Silvas, MD;  Location: [facility] ENDOSCOPY;  Service: Endoscopy;  Laterality: N/A;  . IR IMAGING GUIDED PORT INSERTION  10/01/2017  . Laminectomy posterior lumbar facetectomy and formaninotomy w/Decomp    . Laminectony posterior cervicle decomp w/Facectomy and foraminotomy     . VEIN LIGATION AND STRIPPING Left     FAMILY HISTORY :   Family History  Problem Relation Age of Onset  . Coronary artery disease Mother   . Diabetes type II Mother   . Hypertension Mother   . Tuberculosis Father   . Stroke Father   . Diabetes type II Sister   . Migraines Sister   . Alcohol abuse Brother   . Coronary artery disease Brother   . Diabetes type II Brother   . Kidney cancer Brother   . Kidney disease Brother   . Heart attack Brother   . Hypertension Brother     SOCIAL HISTORY:   Social History   Tobacco Use  . Smoking status: Never Smoker  . Smokeless tobacco: Never Used  Substance Use Topics  . Alcohol use: No  . Drug use: No    ALLERGIES:  is allergic to hydrocodone-acetaminophen; statins; and tramadol-acetaminophen.  MEDICATIONS:  Current Outpatient Medications  Medication Sig Dispense Refill  . acetaminophen (TYLENOL) 500 MG tablet Take 500 mg by mouth every 6 (six) hours as needed.     Marland Kitchen amLODipine (NORVASC) 5 MG tablet Take 5 mg by mouth daily.     Marland Kitchen aspirin EC 81 MG tablet Take 81 mg by mouth daily.    . ASSURE COMFORT LANCETS 30G MISC     . baclofen (LIORESAL) 10 MG tablet Take 1 tablet (10 mg total) by mouth 3 (three) times daily. 90 tablet 1  . calcitRIOL (ROCALTROL) 0.25 MCG capsule Take 0.25 mcg by mouth 3 (three) times a week.     . carvedilol (COREG) 6.25 MG tablet Take 6.25 mg by mouth 2 (two) times daily.  12  . chlorpheniramine-HYDROcodone (TUSSIONEX) 10-8 MG/5ML SUER Take 5 mLs by mouth at bedtime as needed for cough. 70 mL 0  . ferrous sulfate 325 (65 FE) MG tablet Take 325 mg by mouth daily.    . furosemide (LASIX) 40 MG tablet Take 40 mg by mouth daily.     Marland Kitchen gabapentin (NEURONTIN) 100 MG capsule Take 100 mg by mouth 2 (two) times daily.     Marland Kitchen gemfibrozil (LOPID) 600 MG tablet Take 600 mg by mouth daily.     Marland Kitchen glimepiride (AMARYL) 1 MG tablet Take 1 mg by mouth daily with breakfast.     . hydrALAZINE (APRESOLINE) 50 MG tablet Take  25 mg by mouth 3 (three) times daily.   12  . Lancet Devices (ADJUSTABLE LANCING DEVICE) MISC     . latanoprost (XALATAN) 0.005 % ophthalmic solution Place 1 drop into both eyes.     Marland Kitchen lidocaine (LIDODERM) 5 % Place 1 patch onto the skin daily. Remove & Discard patch within 12 hours or as directed by MD 30 patch 0  . lidocaine-prilocaine (EMLA) cream Apply 1 application topically as needed. 30 g 3  . losartan (COZAAR) 50 MG tablet Take 100 mg by mouth daily.     . Multiple Vitamins-Minerals (CENTRUM SILVER) tablet Take 1 tablet by mouth daily.     . Omega-3 Fatty Acids (FISH OIL) 1000 MG CAPS Take 1,000 mg by mouth daily.     Marland Kitchen  omeprazole (PRILOSEC) 20 MG capsule Take 20 mg by mouth daily.     . ONE TOUCH ULTRA TEST test strip     . pioglitazone (ACTOS) 30 MG tablet Take 30 mg by mouth daily.     . timolol (TIMOPTIC-XR) 0.5 % ophthalmic gel-forming Place 1 drop into both eyes.      No current facility-administered medications for this visit.     PHYSICAL EXAMINATION:  BP (!) 170/90 Comment: manual recheck  Pulse 74   Temp 97.7 F (36.5 C) (Oral)   Resp 18   There were no vitals filed for this visit.  Physical Exam  Constitutional: She is oriented to person, place, and time.  Obese.  Walks with a cane.  She is alone.  HENT:  Head: Normocephalic and atraumatic.  Mouth/Throat: Oropharynx is clear and moist. No oropharyngeal exudate.  Eyes: Pupils are equal, round, and reactive to light.  Neck: Normal range of motion. Neck supple.  Cardiovascular: Normal rate and regular rhythm.  Pulmonary/Chest: No respiratory distress. She has no wheezes.  Abdominal: Soft. Bowel sounds are normal. She exhibits no distension and no mass. There is no abdominal tenderness. There is no rebound and no guarding.  Musculoskeletal: Normal range of motion.        General: No tenderness or edema.  Neurological: She is alert and oriented to person, place, and time.  Skin: Skin is warm.  Psychiatric:  Affect normal.    LABORATORY DATA:  I have reviewed the data as listed    Component Value Date/Time   NA 138 03/01/2018 0811   NA 137 10/23/2012 2335   K 4.2 03/01/2018 0811   K 4.4 10/23/2012 2335   CL 106 03/01/2018 0811   CL 107 10/23/2012 2335   CO2 22 03/01/2018 0811   CO2 26 10/23/2012 2335   GLUCOSE 135 (H) 03/01/2018 0811   GLUCOSE 179 (H) 10/23/2012 2335   BUN 62 (H) 03/01/2018 0811   BUN 21 (H) 10/23/2012 2335   CREATININE 2.08 (H) 03/01/2018 0811   CREATININE 1.23 10/23/2012 2335   CALCIUM 10.1 03/01/2018 0811   CALCIUM 10.3 (H) 10/23/2012 2335   PROT 6.4 (L) 09/22/2017 1031   PROT 7.4 06/16/2011 1144   ALBUMIN 3.3 (L) 09/22/2017 1031   ALBUMIN 3.5 06/16/2011 1144   AST 20 09/22/2017 1031   AST 22 08/18/2011 1026   ALT 17 09/22/2017 1031   ALT 63 06/16/2011 1144   ALKPHOS 60 09/22/2017 1031   ALKPHOS 98 06/16/2011 1144   BILITOT 0.7 09/22/2017 1031   BILITOT 0.4 06/16/2011 1144   GFRNONAA 22 (L) 03/01/2018 0811   GFRNONAA 43 (L) 10/23/2012 2335   GFRAA 26 (L) 03/01/2018 0811   GFRAA 50 (L) 10/23/2012 2335    No results found for: SPEP, UPEP  Lab Results  Component Value Date   WBC 3.2 (L) 03/29/2018   NEUTROABS 1.7 03/29/2018   HGB 10.4 (L) 03/29/2018   HCT 33.7 (L) 03/29/2018   MCV 109.1 (H) 03/29/2018   PLT 154 03/29/2018      Chemistry      Component Value Date/Time   NA 138 03/01/2018 0811   NA 137 10/23/2012 2335   K 4.2 03/01/2018 0811   K 4.4 10/23/2012 2335   CL 106 03/01/2018 0811   CL 107 10/23/2012 2335   CO2 22 03/01/2018 0811   CO2 26 10/23/2012 2335   BUN 62 (H) 03/01/2018 0811   BUN 21 (H) 10/23/2012 2335   CREATININE 2.08 (H)  03/01/2018 0811   CREATININE 1.23 10/23/2012 2335      Component Value Date/Time   CALCIUM 10.1 03/01/2018 0811   CALCIUM 10.3 (H) 10/23/2012 2335   ALKPHOS 60 09/22/2017 1031   ALKPHOS 98 06/16/2011 1144   AST 20 09/22/2017 1031   AST 22 08/18/2011 1026   ALT 17 09/22/2017 1031   ALT 63  06/16/2011 1144   BILITOT 0.7 09/22/2017 1031   BILITOT 0.4 06/16/2011 1144     Results for IRVA, LOSER (MRN [id number]) as of 02/18/2017 10:08  Ref. Range 05/12/2016 08:53 09/01/2016 13:52 11/19/2016 08:42 12/22/2016 10:29 01/19/2017 10:20  Iron Latest Ref Range: 28 - 170 ug/dL 75 67 62 79 54  UIBC Latest Units: ug/dL 259 266 280 236 250  TIBC Latest Ref Range: 250 - 450 ug/dL 334 333 342 315 304  Saturation Ratios Latest Ref Range: 10.4 - 31.8 % 23 20 18 25 18   Ferritin Latest Ref Range: 11 - 307 ng/mL 651 (H) 467 (H) 413 (H) 633 (H) 374 (H)    RADIOGRAPHIC STUDIES: I have personally reviewed the radiological images as listed and agreed with the findings in the report. No results found.   ASSESSMENT & PLAN:  Anemia due to stage 3 chronic kidney disease # IDA/ CKD-hemoglobin is today's 10.3; STABLE. Continue  PO iron.  Hold off IV iron/Aranesp today.  # Cough/phlegm/dyspnea/wheezing- ? Acute brconhitis- improved.   # HTN-poorly controlled however at home systolics 308 -657 monitored by nephrology; continue current medication.  # DISPOSITION: # NO aranesp today # in 2 weeks- H&H/possible aranesp # Follow up in 4 weeks-Dr.C; possible aranesp; h&H-Dr.B  CC: Dr.Lateef;   No orders of the defined types were placed in this encounter.  All questions were answered. The patient knows to call the clinic with any problems, questions or concerns.      Cammie Sickle, MD 03/29/2018 10:47 AM

## 2018-03-29 NOTE — Assessment & Plan Note (Addendum)
#   IDA/ CKD-hemoglobin is today's 10.3; STABLE. Continue  PO iron.  Hold off IV iron/Aranesp today.  # Cough/phlegm/dyspnea/wheezing- ? Acute brconhitis- improved.   # HTN-poorly controlled however at home systolics 163 -845 monitored by nephrology; continue current medication.  # DISPOSITION: # NO aranesp today # in 2 weeks- H&H/possible aranesp # Follow up in 4 weeks-Dr.C; possible aranesp; h&H-Dr.B  CC: Dr.Lateef;

## 2018-04-12 ENCOUNTER — Inpatient Hospital Stay: Payer: Medicare HMO | Attending: Internal Medicine

## 2018-04-12 ENCOUNTER — Inpatient Hospital Stay: Payer: Medicare HMO

## 2018-04-12 DIAGNOSIS — Z79899 Other long term (current) drug therapy: Secondary | ICD-10-CM | POA: Diagnosis not present

## 2018-04-12 DIAGNOSIS — N2581 Secondary hyperparathyroidism of renal origin: Secondary | ICD-10-CM | POA: Diagnosis not present

## 2018-04-12 DIAGNOSIS — D7589 Other specified diseases of blood and blood-forming organs: Secondary | ICD-10-CM | POA: Insufficient documentation

## 2018-04-12 DIAGNOSIS — D631 Anemia in chronic kidney disease: Secondary | ICD-10-CM | POA: Insufficient documentation

## 2018-04-12 DIAGNOSIS — N189 Chronic kidney disease, unspecified: Secondary | ICD-10-CM | POA: Diagnosis present

## 2018-04-12 DIAGNOSIS — N183 Chronic kidney disease, stage 3 (moderate): Principal | ICD-10-CM

## 2018-04-12 LAB — HEMATOCRIT: HCT: 31.5 % — ABNORMAL LOW (ref 36.0–46.0)

## 2018-04-12 LAB — HEMOGLOBIN: Hemoglobin: 10 g/dL — ABNORMAL LOW (ref 12.0–15.0)

## 2018-04-12 LAB — SAMPLE TO BLOOD BANK

## 2018-04-25 ENCOUNTER — Other Ambulatory Visit: Payer: Self-pay

## 2018-04-25 DIAGNOSIS — D631 Anemia in chronic kidney disease: Secondary | ICD-10-CM

## 2018-04-25 DIAGNOSIS — N183 Chronic kidney disease, stage 3 unspecified: Secondary | ICD-10-CM

## 2018-04-26 ENCOUNTER — Inpatient Hospital Stay (HOSPITAL_BASED_OUTPATIENT_CLINIC_OR_DEPARTMENT_OTHER): Payer: Medicare HMO | Admitting: Hematology and Oncology

## 2018-04-26 ENCOUNTER — Encounter: Payer: Self-pay | Admitting: Hematology and Oncology

## 2018-04-26 ENCOUNTER — Inpatient Hospital Stay: Payer: Medicare HMO

## 2018-04-26 ENCOUNTER — Other Ambulatory Visit: Payer: Self-pay | Admitting: Hematology and Oncology

## 2018-04-26 VITALS — BP 154/74 | HR 58 | Temp 97.8°F | Resp 16 | Wt 256.2 lb

## 2018-04-26 DIAGNOSIS — D631 Anemia in chronic kidney disease: Secondary | ICD-10-CM

## 2018-04-26 DIAGNOSIS — D7589 Other specified diseases of blood and blood-forming organs: Secondary | ICD-10-CM

## 2018-04-26 DIAGNOSIS — N183 Chronic kidney disease, stage 3 unspecified: Secondary | ICD-10-CM

## 2018-04-26 DIAGNOSIS — N189 Chronic kidney disease, unspecified: Secondary | ICD-10-CM | POA: Diagnosis not present

## 2018-04-26 LAB — BASIC METABOLIC PANEL
Anion gap: 8 (ref 5–15)
BUN: 40 mg/dL — ABNORMAL HIGH (ref 8–23)
CO2: 24 mmol/L (ref 22–32)
Calcium: 10 mg/dL (ref 8.9–10.3)
Chloride: 107 mmol/L (ref 98–111)
Creatinine, Ser: 1.51 mg/dL — ABNORMAL HIGH (ref 0.44–1.00)
GFR calc Af Amer: 38 mL/min — ABNORMAL LOW (ref >60)
GFR calc non Af Amer: 33 mL/min — ABNORMAL LOW (ref >60)
Glucose, Bld: 132 mg/dL — ABNORMAL HIGH (ref 70–99)
Potassium: 4.5 mmol/L (ref 3.5–5.1)
Sodium: 139 mmol/L (ref 135–145)

## 2018-04-26 LAB — CBC WITH DIFFERENTIAL/PLATELET
Abs Immature Granulocytes: 0 10*3/uL (ref 0.00–0.07)
Basophils Absolute: 0 10*3/uL (ref 0.0–0.1)
Basophils Relative: 1 %
Eosinophils Absolute: 0.2 10*3/uL (ref 0.0–0.5)
Eosinophils Relative: 4 %
HCT: 30.9 % — ABNORMAL LOW (ref 36.0–46.0)
Hemoglobin: 9.8 g/dL — ABNORMAL LOW (ref 12.0–15.0)
Immature Granulocytes: 0 %
Lymphocytes Relative: 29 %
Lymphs Abs: 1 10*3/uL (ref 0.7–4.0)
MCH: 33.4 pg (ref 26.0–34.0)
MCHC: 31.7 g/dL (ref 30.0–36.0)
MCV: 105.5 fL — ABNORMAL HIGH (ref 80.0–100.0)
Monocytes Absolute: 0.4 10*3/uL (ref 0.1–1.0)
Monocytes Relative: 12 %
Neutro Abs: 1.9 10*3/uL (ref 1.7–7.7)
Neutrophils Relative %: 54 %
Platelets: 144 10*3/uL — ABNORMAL LOW (ref 150–400)
RBC: 2.93 MIL/uL — ABNORMAL LOW (ref 3.87–5.11)
RDW: 14.3 % (ref 11.5–15.5)
WBC: 3.5 10*3/uL — ABNORMAL LOW (ref 4.0–10.5)
nRBC: 0 % (ref 0.0–0.2)

## 2018-04-26 LAB — FOLATE: Folate: 29 ng/mL (ref >5.9)

## 2018-04-26 LAB — TSH: TSH: 4.29 u[IU]/mL (ref 0.350–4.500)

## 2018-04-26 LAB — VITAMIN B12: Vitamin B-12: 771 pg/mL (ref 180–914)

## 2018-04-26 MED ORDER — SODIUM CHLORIDE 0.9% FLUSH
10.0000 mL | INTRAVENOUS | Status: DC | PRN
  Administered 2018-04-26: 10 mL via INTRAVENOUS
  Filled 2018-04-26: qty 10

## 2018-04-26 MED ORDER — HEPARIN SOD (PORK) LOCK FLUSH 100 UNIT/ML IV SOLN
500.0000 [IU] | Freq: Once | INTRAVENOUS | Status: AC
  Administered 2018-04-26: 500 [IU] via INTRAVENOUS
  Filled 2018-04-26: qty 5

## 2018-04-26 NOTE — Progress Notes (Addendum)
[facility] day:  04/26/2018  Chief Complaint: Sara Bright is a 78 y.o. female with anemia of chronic kidney disease who is seen for new patient assessment.  HPI:  The patient was initially seen by Dr. Rogue Bussing on 10/08/2015.  At that time as she was seen for anemia of chronic disease.  She had been lost to follow-up from the practice.  She previously seen Dr. Inez Pilgrim.  She complained of fatigue and shortness of breath with exertion.  She denied any GI bleeding.  She noted a poor tolerance of oral iron.  Hemoglobin was 8.3.  Ferritin was 63.  Creatinine was 1.77.  Initial stool and urinalysis were negative.  SPEP was negative on 11/12/2015.  Kappa free light chains were 38.1, lambda free light chains 28.7 and ratio 1.33 (normal) on 11/12/2015.    She received Venofer x 4 beginning 10/24/2015.  She began Aranesp on 09/02/2016.  Guaiac cards were positive in 09/2017.  EGD by Dr Olean Ree on 11/10/2017 revealed an irregular Z-line irregular, at the gastroesophageal junction, normal stomach, and normal duodenum.  Colonoscopy on 11/10/2017 revealed diverticulosis in the sigmoid colon and non-bleeding internal hemorrhoids.  She received 1 unit of PRBCs on 09/22/2017.  She received Venofer x 4 (10/22/2015 - 11/05/2015), x4 (01/21/2016 -  02/11/2016), 11/24/2016, 06/22/2017, 09/14/2017, 10/01/2017, 10/12/2017, 10/26/2017, 11/16/2017, and 03/01/2018.  Ferritin has been followed: 63 on 10/08/2015, 290 on 01/14/2016, 651 on 05/12/2016, 467 on 09/01/2016, 413 on 11/19/2016, 633 on 12/22/2016, 374 on 01/19/2017, 480 on 06/22/2017, 135 on 09/22/2017, 159 on 10/06/2017, 373 on 03/01/2018, and 289 on 03/29/2018.    She received Aranesp 200 mcg on 09/02/2016, 12/22/2016, 03/23/2017, 07/20/2017, 08/17/2017, and 09/15/2017.  She received Aranesp 300 mcg on 10/06/2017, 10/19/2017, 01/04/2018, and 03/08/2018.  She was last seen by Dr. Rogue Bussing on 03/29/2018.   At that time, she noted mild fatigue.  Cough and wheezing had improved.  Hematocrit was 33.7, hemoglobin 10.4, MCV 109.1, platelets 154,000, WBC 3200 with an ANC of 1700.  Ferritin was 289 with an iron saturation of 22% and a TIBC of 335.  Creatinine was 1.45 (2.08 on 03/01/2018).  She did not receive Aranesp.  Hematocrit was 31.5 with a hemoglobin 10.0 on 04/12/2018.    Symptomatically, she notes "not much energy".  She denies any melena, hematochezia, hematuria or vaginal bleeding.  She has shortness of breath with exertion.  She has anemia of chronic kidney disease (stage III).   Past Medical History:  Diagnosis Date  . Adenoma of colon   . Adenomatous colon polyp   . Anemia    IDA  . Cataract   . Cervical stenosis of spinal canal   . Chronic kidney disease    stage 3  . Diabetes mellitus without complication ([place])   . GERD (gastroesophageal reflux disease)   . Glaucoma   . Heme positive stool   . History of UTI   . Hx of gallstones   . Hyperkalemia   . Hyperlipidemia   . Hyperlipidemia   . Hypertension   . Lumbar stenosis   . Lymphedema   . Neuromuscular disorder ([place])   . Neuropathy associated with endocrine disorder ([place])   . Obesity   . OSA on CPAP    on CPAP  . Osteoarthritis   . Osteoarthritis   . Port-A-Cath in place   . Retinopathy due to secondary diabetes ([place])   . Secondary hyperparathyroidism of renal origin ([place])  Past Surgical History:  Procedure Laterality Date  . ACDF X2    . BACK SURGERY    . Cataract extraction right    . catarect extraction left    . CHOLECYSTECTOMY    . COLONOSCOPY WITH PROPOFOL N/A 12/02/2016   Procedure: COLONOSCOPY WITH PROPOFOL;  Surgeon: Manya Silvas, MD;  Location: [facility] ENDOSCOPY;  Service: Endoscopy;  Laterality: N/A;  . COLONOSCOPY WITH PROPOFOL N/A 11/10/2017   Procedure: COLONOSCOPY WITH PROPOFOL;  Surgeon: Toledo, Benay Pike, MD;  Location: [facility] ENDOSCOPY;  Service: Gastroenterology;  Laterality: N/A;  .  colonoscopy with removal lesions by snare    . Endoscoic carpal tunnel release    . ESOPHAGOGASTRODUODENOSCOPY N/A 11/10/2017   Procedure: ESOPHAGOGASTRODUODENOSCOPY (EGD);  Surgeon: Toledo, Benay Pike, MD;  Location: [facility] ENDOSCOPY;  Service: Gastroenterology;  Laterality: N/A;  . ESOPHAGOGASTRODUODENOSCOPY (EGD) WITH PROPOFOL N/A 12/02/2016   Procedure: ESOPHAGOGASTRODUODENOSCOPY (EGD) WITH PROPOFOL;  Surgeon: Manya Silvas, MD;  Location: [facility] ENDOSCOPY;  Service: Endoscopy;  Laterality: N/A;  . IR IMAGING GUIDED PORT INSERTION  10/01/2017  . Laminectomy posterior lumbar facetectomy and formaninotomy w/Decomp    . Laminectony posterior cervicle decomp w/Facectomy and foraminotomy    . VEIN LIGATION AND STRIPPING Left     Family History  Problem Relation Age of Onset  . Coronary artery disease Mother   . Diabetes type II Mother   . Hypertension Mother   . Tuberculosis Father   . Stroke Father   . Diabetes type II Sister   . Migraines Sister   . Alcohol abuse Brother   . Coronary artery disease Brother   . Diabetes type II Brother   . Kidney cancer Brother   . Kidney disease Brother   . Heart attack Brother   . Hypertension Brother     Social History:  reports that she has never smoked. She has never used smokeless tobacco. She reports that she does not drink alcohol or use drugs.  She lives in [place].  The patient is alone today.  Allergies:  Allergies  Allergen Reactions  . Hydrocodone-Acetaminophen Other (See Comments)    Sedation and GI upset, pt is not allergic to acetaminophen  . Statins Other (See Comments)  . Tramadol-Acetaminophen Nausea And Vomiting    GI upset, pt is not allergic to acetaminophen    Current Medications: Current Outpatient Medications  Medication Sig Dispense Refill  . acetaminophen (TYLENOL) 500 MG tablet Take 500 mg by mouth every 6 (six) hours as needed.     Marland Kitchen albuterol (PROVENTIL HFA;VENTOLIN HFA) 108 (90 Base) MCG/ACT inhaler Inhale 2  puffs into the lungs every 6 (six) hours as needed for wheezing or shortness of breath.    Marland Kitchen amLODipine (NORVASC) 5 MG tablet Take 5 mg by mouth daily.     Marland Kitchen aspirin EC 81 MG tablet Take 81 mg by mouth daily.    . ASSURE COMFORT LANCETS 30G MISC     . calcitRIOL (ROCALTROL) 0.25 MCG capsule Take 0.5 mcg by mouth 3 (three) times a week.     . carvedilol (COREG) 6.25 MG tablet Take 6.25 mg by mouth 2 (two) times daily.  12  . ferrous sulfate 325 (65 FE) MG tablet Take 325 mg by mouth daily.    . furosemide (LASIX) 40 MG tablet Take 40 mg by mouth daily.     Marland Kitchen gabapentin (NEURONTIN) 100 MG capsule Take 100 mg by mouth 2 (two) times daily.     Marland Kitchen gemfibrozil (LOPID) 600 MG tablet Take  600 mg by mouth daily.     Marland Kitchen glimepiride (AMARYL) 1 MG tablet Take 0.5 mg by mouth daily with breakfast.     . hydrALAZINE (APRESOLINE) 50 MG tablet Take 25 mg by mouth 3 (three) times daily.   12  . Lancet Devices (ADJUSTABLE LANCING DEVICE) MISC     . latanoprost (XALATAN) 0.005 % ophthalmic solution Place 1 drop into both eyes.     Marland Kitchen lidocaine-prilocaine (EMLA) cream Apply 1 application topically as needed. 30 g 3  . losartan (COZAAR) 50 MG tablet Take 100 mg by mouth daily.     . Multiple Vitamins-Minerals (CENTRUM SILVER) tablet Take 1 tablet by mouth daily.     . Omega-3 Fatty Acids (FISH OIL) 1000 MG CAPS Take 1,000 mg by mouth daily.     Marland Kitchen omeprazole (PRILOSEC) 20 MG capsule Take 20 mg by mouth daily.     . ONE TOUCH ULTRA TEST test strip     . pioglitazone (ACTOS) 30 MG tablet Take 30 mg by mouth daily.     . timolol (TIMOPTIC-XR) 0.5 % ophthalmic gel-forming Place 1 drop into both eyes.      No current facility-administered medications for this visit.    Facility-Administered Medications Ordered in Other Visits  Medication Dose Route Frequency Provider Last Rate Last Dose  . heparin lock flush 100 unit/mL  500 Units Intravenous Once ,  C, MD      . sodium chloride flush (NS) 0.9 %  injection 10 mL  10 mL Intravenous PRN Lequita Asal, MD   10 mL at 04/26/18 1014    Review of Systems:  GENERAL:  Not much energy.  No fevers, sweats or weight loss. PERFORMANCE STATUS (ECOG):  1 HEENT:  Visual changes.  No runny nose, sore throat, mouth sores or tenderness. Lungs:  Shortness of breath with exertion.  No cough.  No hemoptysis. Cardiac:  No chest pain, palpitations, orthopnea, or PND. GI:  No nausea, vomiting, diarrhea, constipation, melena or hematochezia. GU:  No urgency, frequency, dysuria, or hematuria. Musculoskeletal:  No back pain.  No joint pain.  No muscle tenderness. Extremities:  No pain or swelling. Skin:  No rashes or skin changes. Neuro:  No headache, numbness or weakness, balance or coordination issues. Endocrine:  Diabetes.  No thyroid issues, hot flashes or night sweats. Psych:  No mood changes, depression or anxiety. Pain:  No focal pain. Review of systems:  All other systems reviewed and found to be negative.  Physical Exam: Blood pressure (!) 154/74, pulse (!) 58, temperature 97.8 F (36.6 C), temperature source Oral, resp. rate 16, weight 256 lb 2.8 oz (116.2 kg), SpO2 100 %. GENERAL:  Well developed, well nourished, heavyset woman sitting comfortably in the exam room in no acute distress. MENTAL STATUS:  Alert and oriented to person, place and time. HEAD:  Wearing a blue wrap.  Normocephalic, atraumatic, face symmetric, no Cushingoid features. EYES:  Glasses.  Brown eyes.  Pupils equal round and reactive to light and accomodation.  No conjunctivitis or scleral icterus. ENT:  Oropharynx clear without lesion.  Tongue normal. Mucous membranes moist.  RESPIRATORY:  Clear to auscultation without rales, wheezes or rhonchi. CARDIOVASCULAR:  Regular rate and rhythm without murmur, rub or gallop. ABDOMEN:  Soft, non-tender, with active bowel sounds, and no hepatosplenomegaly.  No masses. SKIN:  No rashes, ulcers or lesions. EXTREMITIES: No edema, no  skin discoloration or tenderness.  No palpable cords. LYMPH NODES: No palpable cervical, supraclavicular, axillary or inguinal  adenopathy  NEUROLOGICAL: Unremarkable. PSYCH:  Appropriate.   Infusion on 04/26/2018  Component Date Value Ref Range Status  . Sodium 04/26/2018 139  135 - 145 mmol/L Final  . Potassium 04/26/2018 4.5  3.5 - 5.1 mmol/L Final  . Chloride 04/26/2018 107  98 - 111 mmol/L Final  . CO2 04/26/2018 24  22 - 32 mmol/L Final  . Glucose, Bld 04/26/2018 132* 70 - 99 mg/dL Final  . BUN 04/26/2018 40* 8 - 23 mg/dL Final  . Creatinine, Ser 04/26/2018 1.51* 0.44 - 1.00 mg/dL Final  . Calcium 04/26/2018 10.0  8.9 - 10.3 mg/dL Final  . GFR calc non Af Amer 04/26/2018 33* >60 mL/min Final  . GFR calc Af Amer 04/26/2018 38* >60 mL/min Final  . Anion gap 04/26/2018 8  5 - 15 Final   Performed at [facility], [address] [place], [place] [address]  . WBC 04/26/2018 3.5* 4.0 - 10.5 K/uL Final  . RBC 04/26/2018 2.93* 3.87 - 5.11 MIL/uL Final  . Hemoglobin 04/26/2018 9.8* 12.0 - 15.0 g/dL Final  . HCT 04/26/2018 30.9* 36.0 - 46.0 % Final  . MCV 04/26/2018 105.5* 80.0 - 100.0 fL Final  . MCH 04/26/2018 33.4  26.0 - 34.0 pg Final  . MCHC 04/26/2018 31.7  30.0 - 36.0 g/dL Final  . RDW 04/26/2018 14.3  11.5 - 15.5 % Final  . Platelets 04/26/2018 144* 150 - 400 K/uL Final  . nRBC 04/26/2018 0.0  0.0 - 0.2 % Final  . Neutrophils Relative % 04/26/2018 54  % Final  . Neutro Abs 04/26/2018 1.9  1.7 - 7.7 K/uL Final  . Lymphocytes Relative 04/26/2018 29  % Final  . Lymphs Abs 04/26/2018 1.0  0.7 - 4.0 K/uL Final  . Monocytes Relative 04/26/2018 12  % Final  . Monocytes Absolute 04/26/2018 0.4  0.1 - 1.0 K/uL Final  . Eosinophils Relative 04/26/2018 4  % Final  . Eosinophils Absolute 04/26/2018 0.2  0.0 - 0.5 K/uL Final  . Basophils Relative 04/26/2018 1  % Final  . Basophils Absolute 04/26/2018 0.0  0.0 - 0.1 K/uL Final  . Immature Granulocytes 04/26/2018 0  % Final   . Abs Immature Granulocytes 04/26/2018 0.00  0.00 - 0.07 K/uL Final   Performed at [facility], [address], [place], [place] [address]    Assessment:  CATHARINE KETTLEWELL is a 78 y.o. female with anemia of chronic kidney disease.  She has stage III chronic kidney disease.  SPEP and free light chain ratio was normal on 11/12/2015.    She received Aranesp 200 mcg on 09/02/2016, 12/22/2016, 03/23/2017, 07/20/2017, 08/17/2017, and 09/15/2017.  She received Aranesp 300 mcg on 10/06/2017, 10/19/2017, 01/04/2018, and 03/08/2018.  She received 1 unit of PRBCs on 09/22/2017.  She received Venofer x 4 (10/22/2015 - 11/05/2015), x4 (01/21/2016 -  02/11/2016), 11/24/2016, 06/22/2017, 09/14/2017, 10/01/2017, 10/12/2017, 10/26/2017, 11/16/2017, and 03/01/2018.  Ferritin has been followed: 63 on 10/08/2015, 290 on 01/14/2016, 651 on 05/12/2016, 467 on 09/01/2016, 413 on 11/19/2016, 633 on 12/22/2016, 374 on 01/19/2017, 480 on 06/22/2017, 135 on 09/22/2017, 159 on 10/06/2017, 373 on 03/01/2018, and 289 on 03/29/2018.   EGD on 11/10/2017 revealed an irregular Z-line irregular, at the gastroesophageal junction, normal stomach, and normal duodenum.  Colonoscopy on 11/10/2017 revealed diverticulosis in the sigmoid colon and non-bleeding internal hemorrhoids.  Symptomatically, she notes "not much energy".  She denies any melena, hematochezia, hematuria or vaginal bleeding.  She has shortness of breath with  exertion.  Exam is unremarkable.  Plan: 1.   Labs today:  CBC with diff, BMP, B12, folate, TSH, hold tube. 2.   Review entire medical history, diagnosis and management of anemia of chronic renal insufficiency. 3.   Anemia of chronic renal disease  Hematocrit 30.9.  Hemoglobin 9.8.  Discuss insurance switch to Retacrit.  Maintain ferritin >= 100. 4.   Macrocytosis  MCV 105.5.  Patient with macrocytic RBC indices since 10/01/2017.  No known liver disease.  Check B12, folate, TSH.  5.   RTC in  2 weeks for Hgb/HCT and +/- Retacrit 6.   RTC in 4 weeks for Hgb/HCT and +/- Retacrit 7.   RTC in 6 weeks for Hgb/HCT and +/- Retacrit. 8.   RTC in 8 weeks for MD assessment, labs (CBC with diff, BMP, ferritin), +/- Retacrit.  I discussed the assessment and treatment plan with the patient.  The patient was provided an opportunity to ask questions and all were answered.  The patient agreed with the plan and demonstrated an understanding of the instructions.  The patient was advised to call back or seek an in person evaluation if the symptoms worsen or if the condition fails to improve as anticipated.    Lequita Asal, MD  04/26/2018, 11:21 AM

## 2018-04-26 NOTE — Progress Notes (Signed)
Pt here for follow up. Previous Dr. B patient. Denies any concerns at this.

## 2018-05-05 ENCOUNTER — Other Ambulatory Visit: Payer: Self-pay | Admitting: Hematology and Oncology

## 2018-05-05 ENCOUNTER — Other Ambulatory Visit: Payer: Self-pay | Admitting: Internal Medicine

## 2018-05-10 ENCOUNTER — Inpatient Hospital Stay: Payer: Medicare HMO

## 2018-05-10 ENCOUNTER — Inpatient Hospital Stay: Payer: Medicare HMO | Attending: Urgent Care

## 2018-05-10 VITALS — BP 154/72 | HR 65 | Temp 97.6°F | Resp 17

## 2018-05-10 DIAGNOSIS — D631 Anemia in chronic kidney disease: Secondary | ICD-10-CM | POA: Diagnosis present

## 2018-05-10 DIAGNOSIS — N183 Chronic kidney disease, stage 3 unspecified: Secondary | ICD-10-CM

## 2018-05-10 LAB — HEMOGLOBIN AND HEMATOCRIT, BLOOD
HCT: 29.5 % — ABNORMAL LOW (ref 36.0–46.0)
Hemoglobin: 9.4 g/dL — ABNORMAL LOW (ref 12.0–15.0)

## 2018-05-10 MED ORDER — DARBEPOETIN ALFA 300 MCG/0.6ML IJ SOSY
300.0000 ug | PREFILLED_SYRINGE | Freq: Once | INTRAMUSCULAR | Status: AC
  Administered 2018-05-10: 300 ug via SUBCUTANEOUS
  Filled 2018-05-10: qty 0.6

## 2018-05-24 ENCOUNTER — Inpatient Hospital Stay: Payer: Medicare HMO

## 2018-05-30 DIAGNOSIS — E1142 Type 2 diabetes mellitus with diabetic polyneuropathy: Secondary | ICD-10-CM | POA: Insufficient documentation

## 2018-06-06 ENCOUNTER — Other Ambulatory Visit: Payer: Self-pay

## 2018-06-07 ENCOUNTER — Inpatient Hospital Stay: Payer: Medicare HMO

## 2018-06-07 ENCOUNTER — Other Ambulatory Visit: Payer: Self-pay

## 2018-06-07 DIAGNOSIS — N183 Chronic kidney disease, stage 3 (moderate): Secondary | ICD-10-CM | POA: Diagnosis not present

## 2018-06-07 DIAGNOSIS — D631 Anemia in chronic kidney disease: Secondary | ICD-10-CM

## 2018-06-07 LAB — HEMOGLOBIN AND HEMATOCRIT, BLOOD
HCT: 34.6 % — ABNORMAL LOW (ref 36.0–46.0)
Hemoglobin: 10.8 g/dL — ABNORMAL LOW (ref 12.0–15.0)

## 2018-06-21 ENCOUNTER — Other Ambulatory Visit: Payer: Medicare HMO

## 2018-06-21 ENCOUNTER — Ambulatory Visit: Payer: Medicare HMO | Admitting: Hematology and Oncology

## 2018-06-21 ENCOUNTER — Ambulatory Visit: Payer: Medicare HMO

## 2018-07-01 ENCOUNTER — Other Ambulatory Visit: Payer: Self-pay | Admitting: Hematology and Oncology

## 2018-07-01 NOTE — Progress Notes (Signed)
Sara Bright  [address], [address] [place], [place] [address] Phone: [phone]  Fax: [phone]   Telemedicine Office Visit:  07/05/2018  Referring physician: Sallee Lange, *  I connected with Sara Bright on 07/05/2018 at 9:21 AM by videoconferencing and verified that I was speaking with the correct person using 2 identifiers.  The patient was at home.  I discussed the limitations, risk, security and privacy concerns of performing an evaluation and management service by videoconferencing and the availability of in person appointments.  I also discussed with the patient that there may be a patient responsible charge related to this service.  The patient expressed understanding and agreed to proceed.   Chief Complaint: Sara Bright is a 78 y.o. female with anemia of chronic kidney disease who is seen for 2 month assessment.   HPI: The patient was last seen in the hematology clinic on 04/26/2018. At that time, she noted "not much energy".  She denied any melena, hematochezia, hematuria or vaginal bleeding.  She had shortness of breath with exertion.  Exam was unremarkable.  Hemoglobin was 9.8.  MCV was 105.5.  Creatinine 1.51.  B12 was 771.  Folate was 29.0.  TSH was 4.290.  Follow-up labs as follows: 05/10/2018: Hematocrit 29.5 and hemoglobin 9.4.  06/07/2018: Hematocrit 34.6 and hemoglobin 10.8. 07/04/2018: Hematocrit 30.3 and hemoglobin 9.6.  Ferritin 257.  Creatinine 2.04.  She received Aranesp 300 mcg on 05/10/2018 and Retacrit 10,000 units on 07/04/2018.  During the interim, she reports "I feel pretty good this morning." She denies any symptoms with her injection yesterday. Her energy levels have improved. Shortness of breath has improved after stopping her eyedrops. Wheezing has also improved. Her blood pressure yesterday was 115/66, low for her. She reports feeling dizzy since having an increased dose of amlodipine, up to 74m from 5mg .    She will see Dr. Zollie Scale in two weeks.   Past Medical History:  Diagnosis Date  . Adenoma of colon   . Adenomatous colon polyp   . Anemia    IDA  . Cataract   . Cervical stenosis of spinal canal   . Chronic kidney disease    stage 3  . Diabetes mellitus without complication ([place])   . GERD (gastroesophageal reflux disease)   . Glaucoma   . Heme positive stool   . History of UTI   . Hx of gallstones   . Hyperkalemia   . Hyperlipidemia   . Hyperlipidemia   . Hypertension   . Lumbar stenosis   . Lymphedema   . Neuromuscular disorder ([place])   . Neuropathy associated with endocrine disorder ([place])   . Obesity   . OSA on CPAP    on CPAP  . Osteoarthritis   . Osteoarthritis   . Port-A-Cath in place   . Retinopathy due to secondary diabetes ([place])   . Secondary hyperparathyroidism of renal origin [facility])     Past Surgical History:  Procedure Laterality Date  . ACDF X2    . BACK SURGERY    . Cataract extraction right    . catarect extraction left    . CHOLECYSTECTOMY    . COLONOSCOPY WITH PROPOFOL N/A 12/02/2016   Procedure: COLONOSCOPY WITH PROPOFOL;  Surgeon: Manya Silvas, MD;  Location: [facility] ENDOSCOPY;  Service: Endoscopy;  Laterality: N/A;  . COLONOSCOPY WITH PROPOFOL N/A 11/10/2017   Procedure: COLONOSCOPY WITH PROPOFOL;  Surgeon: Toledo, Benay Pike, MD;  Location: [facility] ENDOSCOPY;  Service: Gastroenterology;  Laterality:  N/A;  . colonoscopy with removal lesions by snare    . Endoscoic carpal tunnel release    . ESOPHAGOGASTRODUODENOSCOPY N/A 11/10/2017   Procedure: ESOPHAGOGASTRODUODENOSCOPY (EGD);  Surgeon: Toledo, Benay Pike, MD;  Location: [facility] ENDOSCOPY;  Service: Gastroenterology;  Laterality: N/A;  . ESOPHAGOGASTRODUODENOSCOPY (EGD) WITH PROPOFOL N/A 12/02/2016   Procedure: ESOPHAGOGASTRODUODENOSCOPY (EGD) WITH PROPOFOL;  Surgeon: Manya Silvas, MD;  Location[facility] ENDOSCOPY;  Service: Endoscopy;  Laterality: N/A;  . IR IMAGING GUIDED PORT INSERTION  10/01/2017  .  Laminectomy posterior lumbar facetectomy and formaninotomy w/Decomp    . Laminectony posterior cervicle decomp w/Facectomy and foraminotomy    . VEIN LIGATION AND STRIPPING Left     Family History  Problem Relation Age of Onset  . Coronary artery disease Mother   . Diabetes type II Mother   . Hypertension Mother   . Tuberculosis Father   . Stroke Father   . Diabetes type II Sister   . Migraines Sister   . Alcohol abuse Brother   . Coronary artery disease Brother   . Diabetes type II Brother   . Kidney cancer Brother   . Kidney disease Brother   . Heart attack Brother   . Hypertension Brother     Social History:  reports that she has never smoked. She has never used smokeless tobacco. She reports that she does not drink alcohol or use drugs.  She lives in [place]. She is alone today.   Participants in the patient's visit and their role in the encounter included the patient and Waymon Budge, RN today.  The intake visit was provided by Waymon Budge, RN.  Allergies:  Allergies  Allergen Reactions  . Hydrocodone-Acetaminophen Other (See Comments)    Sedation and GI upset, pt is not allergic to acetaminophen  . Statins Other (See Comments)  . Tramadol-Acetaminophen Nausea And Vomiting    GI upset, pt is not allergic to acetaminophen    Current Medications: Current Outpatient Medications  Medication Sig Dispense Refill  . acetaminophen (TYLENOL) 500 MG tablet Take 500 mg by mouth every 6 (six) hours as needed.     Marland Kitchen albuterol (PROVENTIL HFA;VENTOLIN HFA) 108 (90 Base) MCG/ACT inhaler Inhale 2 puffs into the lungs every 6 (six) hours as needed for wheezing or shortness of breath.    Marland Kitchen amLODipine (NORVASC) 5 MG tablet Take 10 mg by mouth daily.     Marland Kitchen aspirin EC 81 MG tablet Take 81 mg by mouth daily.    . ASSURE COMFORT LANCETS 30G MISC     . calcitRIOL (ROCALTROL) 0.25 MCG capsule Take 0.5 mcg by mouth 3 (three) times a week.     . carvedilol (COREG) 6.25 MG tablet  Take 6.25 mg by mouth 2 (two) times daily.  12  . ferrous sulfate 325 (65 FE) MG tablet Take 325 mg by mouth daily.    . furosemide (LASIX) 40 MG tablet Take 40 mg by mouth daily.     Marland Kitchen gabapentin (NEURONTIN) 100 MG capsule Take 100 mg by mouth 2 (two) times daily.     Marland Kitchen gemfibrozil (LOPID) 600 MG tablet Take 600 mg by mouth daily.     Marland Kitchen glimepiride (AMARYL) 1 MG tablet Take 0.5 mg by mouth daily with breakfast.     . hydrALAZINE (APRESOLINE) 50 MG tablet Take 25 mg by mouth 3 (three) times daily.   12  . Lancet Devices (ADJUSTABLE LANCING DEVICE) MISC     . latanoprost (XALATAN) 0.005 % ophthalmic solution Place 1  drop into both eyes.     Marland Kitchen lidocaine-prilocaine (EMLA) cream Apply 1 application topically as needed. 30 g 3  . losartan (COZAAR) 50 MG tablet Take 100 mg by mouth daily.     . Multiple Vitamins-Minerals (CENTRUM SILVER) tablet Take 1 tablet by mouth daily.     . Omega-3 Fatty Acids (FISH OIL) 1000 MG CAPS Take 1,000 mg by mouth daily.     Marland Kitchen omeprazole (PRILOSEC) 20 MG capsule Take 20 mg by mouth daily.     . ONE TOUCH ULTRA TEST test strip     . pioglitazone (ACTOS) 30 MG tablet Take 30 mg by mouth daily.      No current facility-administered medications for this visit.     Review of Systems:  Review of Systems  Constitutional: Positive for malaise/fatigue (mild, improved). Negative for chills, diaphoresis, fever and weight loss.       "I feel pretty good this morning."  HENT: Negative.  Negative for congestion, nosebleeds, sinus pain and sore throat.   Eyes: Negative.  Negative for double vision, photophobia and pain.  Respiratory: Positive for shortness of breath (on exertion, improved) and wheezing (improved). Negative for cough and sputum production.   Cardiovascular: Negative.  Negative for chest pain, palpitations and leg swelling.       Blood pressure 115/66 yesterday, low for her.   Gastrointestinal: Negative.  Negative for abdominal pain, blood in stool,  constipation, diarrhea, melena, nausea and vomiting.  Genitourinary: Negative.  Negative for dysuria, frequency, hematuria and urgency.  Musculoskeletal: Negative.  Negative for back pain, joint pain, myalgias and neck pain.  Skin: Negative.  Negative for itching and rash.  Neurological: Positive for dizziness. Negative for sensory change, speech change, focal weakness and weakness.  Endo/Heme/Allergies: Does not bruise/bleed easily.       Diabetes. No thyroid issues.  Psychiatric/Behavioral: Negative.  Negative for depression and memory loss. The patient is not nervous/anxious and does not have insomnia.    PERFORMANCE STATUS (ECOG): 1  Physical Exam  Constitutional: She is oriented to person, place, and time. She appears well-developed and well-nourished.  HENT:  Head: Normocephalic and atraumatic.  Black styled hair.  Eyes: Conjunctivae and EOM are normal.  Glasses.  Brown eyes.    Neurological: She is alert and oriented to person, place, and time.  Skin: No pallor.  Psychiatric: She has a normal mood and affect. Her behavior is normal. Judgment and thought content normal.  Nursing note reviewed.   Infusion on 07/04/2018  Component Date Value Ref Range Status  . Ferritin 07/04/2018 257  11 - 307 ng/mL Final   Performed at [facility], [address] [place], [place] [address]  . Sodium 07/04/2018 136  135 - 145 mmol/L Final  . Potassium 07/04/2018 4.5  3.5 - 5.1 mmol/L Final  . Chloride 07/04/2018 106  98 - 111 mmol/L Final  . CO2 07/04/2018 23  22 - 32 mmol/L Final  . Glucose, Bld 07/04/2018 96  70 - 99 mg/dL Final  . BUN 07/04/2018 59* 8 - 23 mg/dL Final  . Creatinine, Ser 07/04/2018 2.04* 0.44 - 1.00 mg/dL Final  . Calcium 07/04/2018 9.8  8.9 - 10.3 mg/dL Final  . GFR calc non Af Amer 07/04/2018 23* >60 mL/min Final  . GFR calc Af Amer 07/04/2018 26* >60 mL/min Final  . Anion gap 07/04/2018 7  5 - 15 Final   Performed at [facility], [address], [place], [place] [address]  .  WBC 07/04/2018 3.9* 4.0 - 10.5 K/uL Final  . RBC 07/04/2018 2.91* 3.87 - 5.11 MIL/uL Final  . Hemoglobin 07/04/2018 9.6* 12.0 - 15.0 g/dL Final  . HCT 07/04/2018 30.3* 36.0 - 46.0 % Final  . MCV 07/04/2018 104.1* 80.0 - 100.0 fL Final  . MCH 07/04/2018 33.0  26.0 - 34.0 pg Final  . MCHC 07/04/2018 31.7  30.0 - 36.0 g/dL Final  . RDW 07/04/2018 14.2  11.5 - 15.5 % Final  . Platelets 07/04/2018 138* 150 - 400 K/uL Final  . nRBC 07/04/2018 0.0  0.0 - 0.2 % Final  . Neutrophils Relative % 07/04/2018 47  % Final  . Neutro Abs 07/04/2018 1.9  1.7 - 7.7 K/uL Final  . Lymphocytes Relative 07/04/2018 35  % Final  . Lymphs Abs 07/04/2018 1.4  0.7 - 4.0 K/uL Final  . Monocytes Relative 07/04/2018 12  % Final  . Monocytes Absolute 07/04/2018 0.5  0.1 - 1.0 K/uL Final  . Eosinophils Relative 07/04/2018 4  % Final  . Eosinophils Absolute 07/04/2018 0.1  0.0 - 0.5 K/uL Final  . Basophils Relative 07/04/2018 1  % Final  . Basophils Absolute 07/04/2018 0.1  0.0 - 0.1 K/uL Final  . Immature Granulocytes 07/04/2018 1  % Final  . Abs Immature Granulocytes 07/04/2018 0.02  0.00 - 0.07 K/uL Final   Performed at [facility], [address] [place], [place] [address]    Assessment:  BLAYNE GARLICK is a 78 y.o. female anemia of chronic kidney disease. She has stage III chronic kidney disease.  SPEP and free light chain ratio was normal on 11/12/2015.    She received Aranesp 200 mcg on 09/02/2016, 12/22/2016, 03/23/2017, 07/20/2017, 08/17/2017, and 09/15/2017.  She received Aranesp 300 mcg on 10/06/2017, 10/19/2017, 01/04/2018, 03/08/2018, and 05/10/2018.  She received Retacrit 10,000 units on 07/04/2018.  She received 1 unit of PRBCs on 09/22/2017.  She received Venofer x 4 (10/22/2015 - 11/05/2015), x4 (01/21/2016 -  02/11/2016), 11/24/2016, 06/22/2017, 09/14/2017, 10/01/2017, 10/12/2017, 10/26/2017, 11/16/2017, and 03/01/2018.  Ferritin has been  followed: 63 on 10/08/2015, 290 on 01/14/2016, 651 on 05/12/2016, 467 on 09/01/2016, 413 on 11/19/2016, 633 on 12/22/2016, 374 on 01/19/2017, 480 on 06/22/2017, 135 on 09/22/2017, 159 on 10/06/2017, 373 on 03/01/2018, 289 on 03/29/2018, and 257 on 07/04/2018.    EGD on 11/10/2017 revealed an irregular Z-line irregular, at the gastroesophageal junction, normal stomach, and normal duodenum.  Colonoscopy on 11/10/2017 revealed diverticulosis in the sigmoid colon and non-bleeding internal hemorrhoids.  She has macrocytic RBC indices.  B12, folate, and TSH were normal on 04/26/2018.  Symptomatically, she feels pretty good.  Plan: 1.   Review interval labs. 2.   Anemia of chronic renal disease             Hematocrit 30.3.  Hemoglobin 9.6.             Continue Retacrit every 2 weeks if Hemoglobin < 11.             Maintaining normal iron stores.    Check ferritin every 3 months 3.   Macrocytosis             MCV pains elevated at 104.1.             Patient with macrocytic RBC indices since 10/01/2017.             Patient with no known liver disease.             B12,  folate and TSH were normal on 04/26/2018.     Patient may have an underlying myelodysplastic syndrome.       4.   Renal insufficiency  Creatinine 2.04.  Patient follows up with Dr. Holley Raring. 5.   RTC every 2 weeks x 3 for Hgb/HCT and +/-Retacrit 6.   RTC in 8 weeks for MD assessment, labs (CBC with diff, BMP, ferritin), +/-Retacrit.  I discussed the assessment and treatment plan with the patient.  The patient was provided an opportunity to ask questions and all were answered.  The patient agreed with the plan and demonstrated an understanding of the instructions.  The patient was advised to call back or seek an in person evaluation if the symptoms worsen or if the condition fails to improve as anticipated.  I provided 24 minutes (9:21 AM  - 9:45 AM) of face-to-face video visit time during this this encounter and > 50% was spent  counseling as documented under my assessment and plan.  I provided these services from the [facility] office.   Nolon Stalls, MD, PhD  07/05/2018, 9:21 AM  I, Molly Dorshimer, am acting as Education administrator for [employer]. Mike Gip, MD, PhD.  I, Melissa C. Mike Gip, MD, have reviewed the above documentation for accuracy and completeness, and I agree with the above.

## 2018-07-04 ENCOUNTER — Inpatient Hospital Stay: Payer: Medicare HMO | Attending: Hematology and Oncology

## 2018-07-04 ENCOUNTER — Other Ambulatory Visit: Payer: Self-pay

## 2018-07-04 ENCOUNTER — Inpatient Hospital Stay: Payer: Medicare HMO

## 2018-07-04 VITALS — BP 115/66 | HR 60 | Temp 96.9°F | Resp 20

## 2018-07-04 DIAGNOSIS — D631 Anemia in chronic kidney disease: Secondary | ICD-10-CM | POA: Diagnosis present

## 2018-07-04 DIAGNOSIS — N183 Chronic kidney disease, stage 3 (moderate): Secondary | ICD-10-CM | POA: Diagnosis present

## 2018-07-04 LAB — BASIC METABOLIC PANEL
Anion gap: 7 (ref 5–15)
BUN: 59 mg/dL — ABNORMAL HIGH (ref 8–23)
CO2: 23 mmol/L (ref 22–32)
Calcium: 9.8 mg/dL (ref 8.9–10.3)
Chloride: 106 mmol/L (ref 98–111)
Creatinine, Ser: 2.04 mg/dL — ABNORMAL HIGH (ref 0.44–1.00)
GFR calc Af Amer: 26 mL/min — ABNORMAL LOW (ref >60)
GFR calc non Af Amer: 23 mL/min — ABNORMAL LOW (ref >60)
Glucose, Bld: 96 mg/dL (ref 70–99)
Potassium: 4.5 mmol/L (ref 3.5–5.1)
Sodium: 136 mmol/L (ref 135–145)

## 2018-07-04 LAB — CBC WITH DIFFERENTIAL/PLATELET
Abs Immature Granulocytes: 0.02 10*3/uL (ref 0.00–0.07)
Basophils Absolute: 0.1 10*3/uL (ref 0.0–0.1)
Basophils Relative: 1 %
Eosinophils Absolute: 0.1 10*3/uL (ref 0.0–0.5)
Eosinophils Relative: 4 %
HCT: 30.3 % — ABNORMAL LOW (ref 36.0–46.0)
Hemoglobin: 9.6 g/dL — ABNORMAL LOW (ref 12.0–15.0)
Immature Granulocytes: 1 %
Lymphocytes Relative: 35 %
Lymphs Abs: 1.4 10*3/uL (ref 0.7–4.0)
MCH: 33 pg (ref 26.0–34.0)
MCHC: 31.7 g/dL (ref 30.0–36.0)
MCV: 104.1 fL — ABNORMAL HIGH (ref 80.0–100.0)
Monocytes Absolute: 0.5 10*3/uL (ref 0.1–1.0)
Monocytes Relative: 12 %
Neutro Abs: 1.9 10*3/uL (ref 1.7–7.7)
Neutrophils Relative %: 47 %
Platelets: 138 10*3/uL — ABNORMAL LOW (ref 150–400)
RBC: 2.91 MIL/uL — ABNORMAL LOW (ref 3.87–5.11)
RDW: 14.2 % (ref 11.5–15.5)
WBC: 3.9 10*3/uL — ABNORMAL LOW (ref 4.0–10.5)
nRBC: 0 % (ref 0.0–0.2)

## 2018-07-04 LAB — FERRITIN: Ferritin: 257 ng/mL (ref 11–307)

## 2018-07-04 MED ORDER — SODIUM CHLORIDE 0.9% FLUSH
10.0000 mL | INTRAVENOUS | Status: DC | PRN
  Administered 2018-07-04: 10 mL via INTRAVENOUS
  Filled 2018-07-04: qty 10

## 2018-07-04 MED ORDER — EPOETIN ALFA-EPBX 10000 UNIT/ML IJ SOLN
10000.0000 [IU] | Freq: Once | INTRAMUSCULAR | Status: AC
  Administered 2018-07-04: 10000 [IU] via SUBCUTANEOUS

## 2018-07-04 MED ORDER — HEPARIN SOD (PORK) LOCK FLUSH 100 UNIT/ML IV SOLN
500.0000 [IU] | Freq: Once | INTRAVENOUS | Status: AC
  Administered 2018-07-04: 500 [IU] via INTRAVENOUS

## 2018-07-05 ENCOUNTER — Ambulatory Visit: Payer: Medicare HMO

## 2018-07-05 ENCOUNTER — Other Ambulatory Visit: Payer: Medicare HMO

## 2018-07-05 ENCOUNTER — Inpatient Hospital Stay (HOSPITAL_BASED_OUTPATIENT_CLINIC_OR_DEPARTMENT_OTHER): Payer: Medicare HMO | Admitting: Hematology and Oncology

## 2018-07-05 DIAGNOSIS — N183 Chronic kidney disease, stage 3 unspecified: Secondary | ICD-10-CM

## 2018-07-05 DIAGNOSIS — D7589 Other specified diseases of blood and blood-forming organs: Secondary | ICD-10-CM

## 2018-07-05 DIAGNOSIS — D631 Anemia in chronic kidney disease: Secondary | ICD-10-CM | POA: Diagnosis not present

## 2018-07-05 NOTE — Progress Notes (Signed)
Confirmed Name, DOB, and Address. Denies any concerns at this time.  

## 2018-07-07 ENCOUNTER — Telehealth: Payer: Self-pay | Admitting: *Deleted

## 2018-07-07 NOTE — Telephone Encounter (Signed)
Patient called in to office. Left message. I called her back. She is having some back pain, tightening and spasms particularly when she lays down in bed at night. She wonders if it is the new medicine. She said we gave her retacrit. She used to take procrit. I told her it was the "same" drug "essentially". That injection can  Cause some mild transient back pain but would not be causing her back to tighten up and spasm. Patient said she is using asper creme patches and taking tylenol. If not better tomorrow, she will call her PCP .

## 2018-07-19 ENCOUNTER — Other Ambulatory Visit: Payer: Self-pay

## 2018-07-19 ENCOUNTER — Inpatient Hospital Stay: Payer: Medicare HMO

## 2018-07-19 ENCOUNTER — Inpatient Hospital Stay: Payer: Medicare HMO | Attending: Hematology and Oncology

## 2018-07-19 VITALS — BP 147/70 | HR 73 | Temp 96.6°F | Resp 20

## 2018-07-19 DIAGNOSIS — N183 Chronic kidney disease, stage 3 (moderate): Secondary | ICD-10-CM | POA: Insufficient documentation

## 2018-07-19 DIAGNOSIS — D631 Anemia in chronic kidney disease: Secondary | ICD-10-CM

## 2018-07-19 LAB — HEMOGLOBIN AND HEMATOCRIT, BLOOD
HCT: 29.7 % — ABNORMAL LOW (ref 36.0–46.0)
Hemoglobin: 9.4 g/dL — ABNORMAL LOW (ref 12.0–15.0)

## 2018-07-19 MED ORDER — EPOETIN ALFA-EPBX 10000 UNIT/ML IJ SOLN
10000.0000 [IU] | Freq: Once | INTRAMUSCULAR | Status: AC
  Administered 2018-07-19: 10000 [IU] via SUBCUTANEOUS

## 2018-07-19 NOTE — Patient Instructions (Signed)
Epoetin Alfa injection °What is this medicine? °EPOETIN ALFA (e POE e tin AL fa) helps your body make more red blood cells. This medicine is used to treat anemia caused by chronic kidney disease, cancer chemotherapy, or HIV-therapy. It may also be used before surgery if you have anemia. °This medicine may be used for other purposes; ask your health care provider or pharmacist if you have questions. °COMMON BRAND NAME(S): Epogen, Procrit, Retacrit °What should I tell my health care provider before I take this medicine? °They need to know if you have any of these conditions: °-cancer °-heart disease °-high blood pressure °-history of blood clots °-history of stroke °-low levels of folate, iron, or vitamin B12 in the blood °-seizures °-an unusual or allergic reaction to erythropoietin, albumin, benzyl alcohol, hamster proteins, other medicines, foods, dyes, or preservatives °-pregnant or trying to get pregnant °-breast-feeding °How should I use this medicine? °This medicine is for injection into a vein or under the skin. It is usually given by a health care professional in a hospital or clinic setting. °If you get this medicine at home, you will be taught how to prepare and give this medicine. Use exactly as directed. Take your medicine at regular intervals. Do not take your medicine more often than directed. °It is important that you put your used needles and syringes in a special sharps container. Do not put them in a trash can. If you do not have a sharps container, call your pharmacist or healthcare provider to get one. °A special MedGuide will be given to you by the pharmacist with each prescription and refill. Be sure to read this information carefully each time. °Talk to your pediatrician regarding the use of this medicine in children. While this drug may be prescribed for selected conditions, precautions do apply. °Overdosage: If you think you have taken too much of this medicine contact a poison control center  or emergency room at once. °NOTE: This medicine is only for you. Do not share this medicine with others. °What if I miss a dose? °If you miss a dose, take it as soon as you can. If it is almost time for your next dose, take only that dose. Do not take double or extra doses. °What may interact with this medicine? °Interactions have not been studied. °This list may not describe all possible interactions. Give your health care provider a list of all the medicines, herbs, non-prescription drugs, or dietary supplements you use. Also tell them if you smoke, drink alcohol, or use illegal drugs. Some items may interact with your medicine. °What should I watch for while using this medicine? °Your condition will be monitored carefully while you are receiving this medicine. °You may need blood work done while you are taking this medicine. °This medicine may cause a decrease in vitamin B6. You should make sure that you get enough vitamin B6 while you are taking this medicine. Discuss the foods you eat and the vitamins you take with your health care professional. °What side effects may I notice from receiving this medicine? °Side effects that you should report to your doctor or health care professional as soon as possible: °-allergic reactions like skin rash, itching or hives, swelling of the face, lips, or tongue °-seizures °-signs and symptoms of a blood clot such as breathing problems; changes in vision; chest pain; severe, sudden headache; pain, swelling, warmth in the leg; trouble speaking; sudden numbness or weakness of the face, arm or leg °-signs and symptoms of a stroke   like changes in vision; confusion; trouble speaking or understanding; severe headaches; sudden numbness or weakness of the face, arm or leg; trouble walking; dizziness; loss of balance or coordination °Side effects that usually do not require medical attention (report to your doctor or health care professional if they continue or are  bothersome): °-chills °-cough °-dizziness °-fever °-headaches °-joint pain °-muscle cramps °-muscle pain °-nausea, vomiting °-pain, redness, or irritation at site where injected °This list may not describe all possible side effects. Call your doctor for medical advice about side effects. You may report side effects to FDA at 1-800-FDA-1088. °Where should I keep my medicine? °Keep out of the reach of children. °Store in a refrigerator between 2 and 8 degrees C (36 and 46 degrees F). Do not freeze or shake. Throw away any unused portion if using a single-dose vial. Multi-dose vials can be kept in the refrigerator for up to 21 days after the initial dose. Throw away unused medicine. °NOTE: This sheet is a summary. It may not cover all possible information. If you have questions about this medicine, talk to your doctor, pharmacist, or health care provider. °© 2019 [employer]vier/[employer] (2016-10-02 08:35:19) ° °

## 2018-08-01 ENCOUNTER — Other Ambulatory Visit: Payer: Self-pay

## 2018-08-02 ENCOUNTER — Inpatient Hospital Stay: Payer: Medicare HMO

## 2018-08-02 ENCOUNTER — Telehealth (INDEPENDENT_AMBULATORY_CARE_PROVIDER_SITE_OTHER): Payer: Self-pay | Admitting: Vascular Surgery

## 2018-08-02 VITALS — BP 144/65 | HR 78 | Temp 97.9°F | Resp 20

## 2018-08-02 DIAGNOSIS — D631 Anemia in chronic kidney disease: Secondary | ICD-10-CM

## 2018-08-02 DIAGNOSIS — N183 Chronic kidney disease, stage 3 unspecified: Secondary | ICD-10-CM

## 2018-08-02 LAB — HEMOGLOBIN AND HEMATOCRIT, BLOOD
HCT: 28.8 % — ABNORMAL LOW (ref 36.0–46.0)
Hemoglobin: 9.2 g/dL — ABNORMAL LOW (ref 12.0–15.0)

## 2018-08-02 MED ORDER — EPOETIN ALFA-EPBX 10000 UNIT/ML IJ SOLN
10000.0000 [IU] | Freq: Once | INTRAMUSCULAR | Status: AC
  Administered 2018-08-02: 10000 [IU] via SUBCUTANEOUS
  Filled 2018-08-02: qty 1

## 2018-08-02 NOTE — Progress Notes (Signed)
Patient states she got in touch with her PCP today for swelling in her Left Lower extremity that is causing ulcers to start draining. Her PCP has called in antibiotics for her, she is leaving here and picking up prescription on her way home. Patient states she had this same problem a year ago. Hemoglobin is 9.2. She will get retacrit injection as ordered.

## 2018-08-02 NOTE — Telephone Encounter (Signed)
I spoke with the patient and offer her to come on Friday and she stated she will be going to her primary care doctor today

## 2018-08-05 ENCOUNTER — Encounter (INDEPENDENT_AMBULATORY_CARE_PROVIDER_SITE_OTHER): Payer: Self-pay | Admitting: Nurse Practitioner

## 2018-08-05 ENCOUNTER — Ambulatory Visit (INDEPENDENT_AMBULATORY_CARE_PROVIDER_SITE_OTHER): Payer: Medicare HMO | Admitting: Nurse Practitioner

## 2018-08-05 ENCOUNTER — Other Ambulatory Visit: Payer: Self-pay

## 2018-08-05 VITALS — BP 155/63 | HR 81 | Resp 16 | Wt 265.0 lb

## 2018-08-05 DIAGNOSIS — I10 Essential (primary) hypertension: Secondary | ICD-10-CM | POA: Diagnosis not present

## 2018-08-05 DIAGNOSIS — I890 Lymphedema, not elsewhere classified: Secondary | ICD-10-CM

## 2018-08-05 DIAGNOSIS — I83029 Varicose veins of left lower extremity with ulcer of unspecified site: Secondary | ICD-10-CM

## 2018-08-05 DIAGNOSIS — Z79899 Other long term (current) drug therapy: Secondary | ICD-10-CM

## 2018-08-05 DIAGNOSIS — L97929 Non-pressure chronic ulcer of unspecified part of left lower leg with unspecified severity: Secondary | ICD-10-CM | POA: Diagnosis not present

## 2018-08-12 ENCOUNTER — Ambulatory Visit (INDEPENDENT_AMBULATORY_CARE_PROVIDER_SITE_OTHER): Payer: Medicare HMO | Admitting: Vascular Surgery

## 2018-08-12 ENCOUNTER — Other Ambulatory Visit: Payer: Self-pay

## 2018-08-12 VITALS — BP 155/69 | HR 89 | Resp 14 | Ht 64.0 in | Wt 262.0 lb

## 2018-08-12 DIAGNOSIS — R600 Localized edema: Secondary | ICD-10-CM | POA: Diagnosis not present

## 2018-08-12 DIAGNOSIS — L97929 Non-pressure chronic ulcer of unspecified part of left lower leg with unspecified severity: Secondary | ICD-10-CM | POA: Diagnosis not present

## 2018-08-12 DIAGNOSIS — M7989 Other specified soft tissue disorders: Secondary | ICD-10-CM

## 2018-08-12 NOTE — Progress Notes (Signed)
History of Present Illness  There is no documented history at this time  Assessments & Plan   There are no diagnoses linked to this encounter.    Additional instructions  Subjective:  Patient presents with venous ulcer of the Left lower extremity.    Procedure:  3 layer unna wrap was placed Left lower extremity.   Plan:   Follow up in one week.  

## 2018-08-13 ENCOUNTER — Encounter (INDEPENDENT_AMBULATORY_CARE_PROVIDER_SITE_OTHER): Payer: Self-pay | Admitting: Nurse Practitioner

## 2018-08-13 DIAGNOSIS — L97929 Non-pressure chronic ulcer of unspecified part of left lower leg with unspecified severity: Secondary | ICD-10-CM | POA: Insufficient documentation

## 2018-08-13 DIAGNOSIS — I83029 Varicose veins of left lower extremity with ulcer of unspecified site: Secondary | ICD-10-CM | POA: Insufficient documentation

## 2018-08-13 NOTE — Progress Notes (Signed)
SUBJECTIVE:  Patient ID: Sara Bright, female    DOB: Oct 21, 1940, 78 y.o.   MRN: [id number] Chief Complaint  Patient presents with  . Follow-up    left leg open wounds    HPI  Sara Bright is a 78 y.o. female Patient is seen for evaluation of leg pain and swelling associated with new onset ulceration. The patient first noticed the swelling remotely. The swelling is associated with pain and discoloration. The pain and swelling worsens with prolonged dependency and improves with elevation. The pain is unrelated to activity.  The patient notes that in the morning the legs are better but the leg symptoms worsened throughout the course of the day. The patient has also noted a progressive worsening of the discoloration in the ankle and shin area.   The patient notes that an ulcer has developed acutely without specific trauma and since it occurred it has been very slow to heal.  There is a moderate amount of drainage associated with the open area.  The wound is also very painful.  The patient denies claudication symptoms or rest pain symptoms.  The patient denies DJD and LS spine disease.  The patient has not had any past angiography, interventions or vascular surgery.  Elevation makes the leg symptoms better, dependency makes them much worse. The patient denies any recent changes in medications.  The patient has not been wearing graduated compression.  The patient denies a history of DVT or PE. There is no prior history of phlebitis. There is no history of primary lymphedema.  No history of malignancies. No history of trauma or groin or pelvic surgery. There is no history of radiation treatment to the groin or pelvis       Past Medical History:  Diagnosis Date  . Adenoma of colon   . Adenomatous colon polyp   . Anemia    IDA  . Cataract   . Cervical stenosis of spinal canal   . Chronic kidney disease    stage 3  . Diabetes mellitus without complication ([place])   . GERD  (gastroesophageal reflux disease)   . Glaucoma   . Heme positive stool   . History of UTI   . Hx of gallstones   . Hyperkalemia   . Hyperlipidemia   . Hyperlipidemia   . Hypertension   . Lumbar stenosis   . Lymphedema   . Neuromuscular disorder ([place])   . Neuropathy associated with endocrine disorder ([place])   . Obesity   . OSA on CPAP    on CPAP  . Osteoarthritis   . Osteoarthritis   . Port-A-Cath in place   . Retinopathy due to secondary diabetes ([place])   . Secondary hyperparathyroidism of renal origin [facility])     Past Surgical History:  Procedure Laterality Date  . ACDF X2    . BACK SURGERY    . Cataract extraction right    . catarect extraction left    . CHOLECYSTECTOMY    . COLONOSCOPY WITH PROPOFOL N/A 12/02/2016   Procedure: COLONOSCOPY WITH PROPOFOL;  Surgeon: Manya Silvas, MD;  Location: [facility] ENDOSCOPY;  Service: Endoscopy;  Laterality: N/A;  . COLONOSCOPY WITH PROPOFOL N/A 11/10/2017   Procedure: COLONOSCOPY WITH PROPOFOL;  Surgeon: Toledo, Benay Pike, MD;  Location: [facility] ENDOSCOPY;  Service: Gastroenterology;  Laterality: N/A;  . colonoscopy with removal lesions by snare    . Endoscoic carpal tunnel release    . ESOPHAGOGASTRODUODENOSCOPY N/A 11/10/2017   Procedure: ESOPHAGOGASTRODUODENOSCOPY (EGD);  Surgeon: Hartland,  Benay Pike, MD;  Location: [facility] ENDOSCOPY;  Service: Gastroenterology;  Laterality: N/A;  . ESOPHAGOGASTRODUODENOSCOPY (EGD) WITH PROPOFOL N/A 12/02/2016   Procedure: ESOPHAGOGASTRODUODENOSCOPY (EGD) WITH PROPOFOL;  Surgeon: Manya Silvas, MD;  Location: [facility] ENDOSCOPY;  Service: Endoscopy;  Laterality: N/A;  . IR IMAGING GUIDED PORT INSERTION  10/01/2017  . Laminectomy posterior lumbar facetectomy and formaninotomy w/Decomp    . Laminectony posterior cervicle decomp w/Facectomy and foraminotomy    . VEIN LIGATION AND STRIPPING Left     Social History   Socioeconomic History  . Marital status: Widowed    Spouse name: Not on file  . Number of  children: Not on file  . Years of education: Not on file  . Highest education level: Not on file  Occupational History  . Not on file  Social Needs  . Financial resource strain: Not on file  . Food insecurity:    Worry: Not on file    Inability: Not on file  . Transportation needs:    Medical: Not on file    Non-medical: Not on file  Tobacco Use  . Smoking status: Never Smoker  . Smokeless tobacco: Never Used  Substance and Sexual Activity  . Alcohol use: No  . Drug use: No  . Sexual activity: Not on file  Lifestyle  . Physical activity:    Days per week: Not on file    Minutes per session: Not on file  . Stress: Not on file  Relationships  . Social connections:    Talks on phone: Not on file    Gets together: Not on file    Attends religious service: Not on file    Active member of club or organization: Not on file    Attends meetings of clubs or organizations: Not on file    Relationship status: Not on file  . Intimate partner violence:    Fear of current or ex partner: Not on file    Emotionally abused: Not on file    Physically abused: Not on file    Forced sexual activity: Not on file  Other Topics Concern  . Not on file  Social History Narrative  . Not on file    Family History  Problem Relation Age of Onset  . Coronary artery disease Mother   . Diabetes type II Mother   . Hypertension Mother   . Tuberculosis Father   . Stroke Father   . Diabetes type II Sister   . Migraines Sister   . Alcohol abuse Brother   . Coronary artery disease Brother   . Diabetes type II Brother   . Kidney cancer Brother   . Kidney disease Brother   . Heart attack Brother   . Hypertension Brother     Allergies  Allergen Reactions  . Hydrocodone-Acetaminophen Other (See Comments)    Sedation and GI upset, pt is not allergic to acetaminophen  . Statins Other (See Comments)  . Tramadol-Acetaminophen Nausea And Vomiting    GI upset, pt is not allergic to acetaminophen      Review of Systems   Review of Systems: Negative Unless Checked Constitutional: [] Weight loss  [] Fever  [] Chills Cardiac: [] Chest pain   []  Atrial Fibrillation  [] Palpitations   [] Shortness of breath when laying flat   [] Shortness of breath with exertion. [] Shortness of breath at rest Vascular:  [] Pain in legs with walking   [] Pain in legs with standing [] Pain in legs when laying flat   [] Claudication    [] Pain  in feet when laying flat    [] History of DVT   [] Phlebitis   [x] Swelling in legs   [] Varicose veins   [x] Non-healing ulcers Pulmonary:   [] Uses home oxygen   [] Productive cough   [] Hemoptysis   [] Wheeze  [] COPD   [] Asthma Neurologic:  [] Dizziness   [] Seizures  [] Blackouts [] History of stroke   [] History of TIA  [] Aphasia   [] Temporary Blindness   [] Weakness or numbness in arm   [] Weakness or numbness in leg Musculoskeletal:   [] Joint swelling   [] Joint pain   [] Low back pain  []  History of Knee Replacement [] Arthritis [] back Surgeries  []  Spinal Stenosis    Hematologic:  [] Easy bruising  [] Easy bleeding   [] Hypercoagulable state   [x] Anemic Gastrointestinal:  [] Diarrhea   [] Vomiting  [x] Gastroesophageal reflux/heartburn   [] Difficulty swallowing. [] Abdominal pain Genitourinary:  [] Chronic kidney disease   [] Difficult urination  [] Anuric   [] Blood in urine [] Frequent urination  [] Burning with urination   [] Hematuria Skin:  [] Rashes   [] Ulcers [] Wounds Psychological:  [] History of anxiety   []  History of major depression  []  Memory Difficulties      OBJECTIVE:   Physical Exam  BP (!) 155/63 (BP Location: Left Arm)   Pulse 81   Resp 16   Wt 265 lb (120.2 kg)   BMI 45.49 kg/m   Gen: WD/WN, NAD Head: /AT, No temporalis wasting.  Ear/Nose/Throat: Hearing grossly intact, nares w/o erythema or drainage Eyes: PER, EOMI, sclera nonicteric.  Neck: Supple, no masses.  No JVD.  Pulmonary:  Good air movement, no use of accessory muscles.  Cardiac: RRR Vascular: 2-3+ edema of the left  leg with severe venous changes of the left leg.  Venous ulcer noted in the shin area on the left, noninfected  Venous stasis dermatitis with ulcers present on the left Vessel Right Left  Radial Palpable Palpable   Gastrointestinal: soft, non-distended. No guarding/no peritoneal signs.  Musculoskeletal: M/S 5/5 throughout.  No deformity or atrophy.  Neurologic: Pain and light touch intact in extremities.  Symmetrical.  Speech is fluent. Motor exam as listed above. Psychiatric: Judgment intact, Mood & affect appropriate for pt's clinical situation. Dermatologic: .  No changes consistent with cellulitis. Lymph : No Cervical lymphadenopathy, no lichenification or skin changes of chronic lymphedema.         ASSESSMENT AND PLAN:  1. Venous ulcer of left leg (HCC) No surgery or intervention at this point in time.    I have had a long discussion with the patient regarding venous insufficiency and why it  causes symptoms, specifically venous ulceration . I have discussed with the patient the chronic skin changes that accompany venous insufficiency and the long term sequela such as infection and recurring  ulceration.  Patient will be placed in [employer] which will be changed weekly drainage permitting.  In addition, behavioral modification including several periods of elevation of the lower extremities during the day will be continued. Achieving a position with the ankles at heart level was stressed to the patient  The patient is instructed to begin routine exercise, especially walking on a daily basis  Patient will follow up in four weeks for check of venous ulcer  2. Essential hypertension Continue antihypertensive medications as already ordered, these medications have been reviewed and there are no changes at this time.   3. Lymphedema See above for conservative therapy.  Patient will continue to wear compression socks on unaffected leg.    Current Outpatient Medications on File  Prior to Visit  Medication Sig Dispense Refill  . acetaminophen (TYLENOL) 500 MG tablet Take 500 mg by mouth every 6 (six) hours as needed.     Marland Kitchen albuterol (PROVENTIL HFA;VENTOLIN HFA) 108 (90 Base) MCG/ACT inhaler Inhale 2 puffs into the lungs every 6 (six) hours as needed for wheezing or shortness of breath.    Marland Kitchen amLODipine (NORVASC) 5 MG tablet Take 10 mg by mouth daily.     Marland Kitchen aspirin EC 81 MG tablet Take 81 mg by mouth daily.    . ASSURE COMFORT LANCETS 30G MISC     . carvedilol (COREG) 6.25 MG tablet Take 6.25 mg by mouth 2 (two) times daily.  12  . ferrous sulfate 325 (65 FE) MG tablet Take 325 mg by mouth daily.    . furosemide (LASIX) 40 MG tablet Take 40 mg by mouth daily.     Marland Kitchen gabapentin (NEURONTIN) 100 MG capsule Take 100 mg by mouth 2 (two) times daily.     Marland Kitchen gemfibrozil (LOPID) 600 MG tablet Take 600 mg by mouth daily.     Marland Kitchen glimepiride (AMARYL) 1 MG tablet Take 0.5 mg by mouth daily with breakfast.     . hydrALAZINE (APRESOLINE) 50 MG tablet Take 25 mg by mouth 3 (three) times daily.   12  . Lancet Devices (ADJUSTABLE LANCING DEVICE) MISC     . latanoprost (XALATAN) 0.005 % ophthalmic solution Place 1 drop into both eyes.     Marland Kitchen lidocaine-prilocaine (EMLA) cream Apply 1 application topically as needed. 30 g 3  . losartan (COZAAR) 50 MG tablet Take 100 mg by mouth daily.     . Multiple Vitamins-Minerals (CENTRUM SILVER) tablet Take 1 tablet by mouth daily.     . Omega-3 Fatty Acids (FISH OIL) 1000 MG CAPS Take 1,000 mg by mouth daily.     Marland Kitchen omeprazole (PRILOSEC) 20 MG capsule Take 20 mg by mouth daily.     . ONE TOUCH ULTRA TEST test strip     . pioglitazone (ACTOS) 30 MG tablet Take 30 mg by mouth daily.     . calcitRIOL (ROCALTROL) 0.25 MCG capsule Take 0.5 mcg by mouth 3 (three) times a week.      No current facility-administered medications on file prior to visit.     There are no Patient Instructions on file for this visit. Return in about 4 weeks (around 09/02/2018) for  Unna Check.   Kris Hartmann, NP  This note was completed with Sales executive.  Any errors are purely unintentional.

## 2018-08-16 ENCOUNTER — Other Ambulatory Visit: Payer: Self-pay

## 2018-08-16 ENCOUNTER — Inpatient Hospital Stay: Payer: Medicare HMO

## 2018-08-16 ENCOUNTER — Inpatient Hospital Stay: Payer: Medicare HMO | Attending: Hematology and Oncology

## 2018-08-16 VITALS — BP 157/71 | HR 76 | Temp 97.6°F | Resp 18

## 2018-08-16 DIAGNOSIS — E1140 Type 2 diabetes mellitus with diabetic neuropathy, unspecified: Secondary | ICD-10-CM | POA: Insufficient documentation

## 2018-08-16 DIAGNOSIS — D631 Anemia in chronic kidney disease: Secondary | ICD-10-CM

## 2018-08-16 DIAGNOSIS — N183 Chronic kidney disease, stage 3 unspecified: Secondary | ICD-10-CM

## 2018-08-16 DIAGNOSIS — E1122 Type 2 diabetes mellitus with diabetic chronic kidney disease: Secondary | ICD-10-CM | POA: Diagnosis not present

## 2018-08-16 DIAGNOSIS — M1990 Unspecified osteoarthritis, unspecified site: Secondary | ICD-10-CM | POA: Insufficient documentation

## 2018-08-16 DIAGNOSIS — Z7982 Long term (current) use of aspirin: Secondary | ICD-10-CM | POA: Diagnosis not present

## 2018-08-16 DIAGNOSIS — D7589 Other specified diseases of blood and blood-forming organs: Secondary | ICD-10-CM | POA: Diagnosis not present

## 2018-08-16 DIAGNOSIS — G4733 Obstructive sleep apnea (adult) (pediatric): Secondary | ICD-10-CM | POA: Diagnosis not present

## 2018-08-16 DIAGNOSIS — I129 Hypertensive chronic kidney disease with stage 1 through stage 4 chronic kidney disease, or unspecified chronic kidney disease: Secondary | ICD-10-CM | POA: Diagnosis not present

## 2018-08-16 DIAGNOSIS — M48061 Spinal stenosis, lumbar region without neurogenic claudication: Secondary | ICD-10-CM | POA: Diagnosis not present

## 2018-08-16 DIAGNOSIS — N2581 Secondary hyperparathyroidism of renal origin: Secondary | ICD-10-CM | POA: Diagnosis not present

## 2018-08-16 DIAGNOSIS — H409 Unspecified glaucoma: Secondary | ICD-10-CM | POA: Diagnosis not present

## 2018-08-16 DIAGNOSIS — Z79899 Other long term (current) drug therapy: Secondary | ICD-10-CM | POA: Insufficient documentation

## 2018-08-16 DIAGNOSIS — E669 Obesity, unspecified: Secondary | ICD-10-CM | POA: Diagnosis not present

## 2018-08-16 DIAGNOSIS — E785 Hyperlipidemia, unspecified: Secondary | ICD-10-CM | POA: Diagnosis not present

## 2018-08-16 DIAGNOSIS — E11319 Type 2 diabetes mellitus with unspecified diabetic retinopathy without macular edema: Secondary | ICD-10-CM | POA: Insufficient documentation

## 2018-08-16 DIAGNOSIS — Z7984 Long term (current) use of oral hypoglycemic drugs: Secondary | ICD-10-CM | POA: Diagnosis not present

## 2018-08-16 DIAGNOSIS — K219 Gastro-esophageal reflux disease without esophagitis: Secondary | ICD-10-CM | POA: Insufficient documentation

## 2018-08-16 LAB — HEMOGLOBIN AND HEMATOCRIT, BLOOD
HCT: 29.2 % — ABNORMAL LOW (ref 36.0–46.0)
Hemoglobin: 9 g/dL — ABNORMAL LOW (ref 12.0–15.0)

## 2018-08-16 MED ORDER — EPOETIN ALFA-EPBX 10000 UNIT/ML IJ SOLN
10000.0000 [IU] | Freq: Once | INTRAMUSCULAR | Status: AC
  Administered 2018-08-16: 10000 [IU] via SUBCUTANEOUS

## 2018-08-19 ENCOUNTER — Encounter (INDEPENDENT_AMBULATORY_CARE_PROVIDER_SITE_OTHER): Payer: Self-pay

## 2018-08-19 ENCOUNTER — Ambulatory Visit (INDEPENDENT_AMBULATORY_CARE_PROVIDER_SITE_OTHER): Payer: Medicare HMO | Admitting: Nurse Practitioner

## 2018-08-19 ENCOUNTER — Other Ambulatory Visit: Payer: Self-pay

## 2018-08-19 VITALS — BP 145/65 | HR 78 | Resp 18 | Ht 64.0 in | Wt 264.0 lb

## 2018-08-19 DIAGNOSIS — I83029 Varicose veins of left lower extremity with ulcer of unspecified site: Secondary | ICD-10-CM

## 2018-08-19 DIAGNOSIS — L97929 Non-pressure chronic ulcer of unspecified part of left lower leg with unspecified severity: Secondary | ICD-10-CM

## 2018-08-19 NOTE — Progress Notes (Signed)
History of Present Illness  There is no documented history at this time  Assessments & Plan   There are no diagnoses linked to this encounter.    Additional instructions  Subjective:  Patient presents with venous ulcer of the Left lower extremity.    Procedure:  3 layer unna wrap was placed Left lower extremity.   Plan:   Follow up in one week.  

## 2018-08-26 ENCOUNTER — Other Ambulatory Visit: Payer: Self-pay

## 2018-08-26 ENCOUNTER — Encounter (INDEPENDENT_AMBULATORY_CARE_PROVIDER_SITE_OTHER): Payer: Self-pay | Admitting: Nurse Practitioner

## 2018-08-26 ENCOUNTER — Ambulatory Visit (INDEPENDENT_AMBULATORY_CARE_PROVIDER_SITE_OTHER): Payer: Medicare HMO | Admitting: Nurse Practitioner

## 2018-08-26 VITALS — BP 158/73 | HR 73 | Resp 16 | Wt 259.6 lb

## 2018-08-26 DIAGNOSIS — L97929 Non-pressure chronic ulcer of unspecified part of left lower leg with unspecified severity: Secondary | ICD-10-CM | POA: Diagnosis not present

## 2018-08-26 DIAGNOSIS — I890 Lymphedema, not elsewhere classified: Secondary | ICD-10-CM

## 2018-08-26 DIAGNOSIS — I10 Essential (primary) hypertension: Secondary | ICD-10-CM

## 2018-08-26 DIAGNOSIS — I83029 Varicose veins of left lower extremity with ulcer of unspecified site: Secondary | ICD-10-CM | POA: Diagnosis not present

## 2018-08-26 DIAGNOSIS — Z79899 Other long term (current) drug therapy: Secondary | ICD-10-CM

## 2018-08-29 NOTE — Progress Notes (Signed)
[facility]  [address] [place], [place] [address] Phone: [phone]  Fax: [phone]   Clinic Day:  08/30/2018  Referring physician: Sallee Lange, *  Chief Complaint: Sara Bright is a 78 y.o. female with anemia of chronic kidney disease who is seen for 4 month assessment.   HPI: The patient was last seen in the hematology clinic on 07/05/2018. At that time, she felt pretty good.  She contacted the clinic on 07/07/2018 secondary to back pain, tightening and spasms particularly when she lays down in bed at night. She was concerned it was secondary to Retacrit. She was taking Tylenol and using aspercreme patches.   She had edema in her left lower extremity that was causing her ulcers to start draining. Her PCP prescribed her antibiotics.Hemoglobin was 9.2 on 08/02/2018.  She was seen by Eulogio Ditch, NP at [facility] for a follow up of the left leg open wound on 08/05/2018; and was given a [employer] which will be changed weekly drainage permitting.   Seen by Sharlotte Alamo, DPM about right foot pain on 08/16/2018. Evaluation revealed diabetes with onychomycosis, and plantar fasciitis of the right foot.  Seen by Dr. Raul Del for obstructive sleep apnea on 08/26/2018. She was diagnosed with obstructive sleep apnea, pulmonary nodule and other disease of the lung not elsewhere classified. She was also diagnosed with acute bronchospasm.   Labs followed: 07/19/2018: Hemoglobin 9.4. Hematocrit 29.7. 08/02/2018: Hemoglobin 9.2. Hematocrit 28.8. 08/16/2018: Hemoglobin 9.0. Hematocrit 29.2.  She received Retacrit on 07/19/2018, 08/02/2018, and 08/16/2018.  During the interim, she has done well.  She is using a CPAP machine every night. She is taking oral iron. She reports wheezing, and uses an inhaler.  She reports her BP is up and down. She cut back on ham, pork, and all salty foods. She is trying to drink more water. Her  BP was 155/68 in the clinic today.  She has a bowel movement about 3 times a day; stool is soft. Every other day she will have a normal bowel movement, which she attributes to her medication.  She is concerned she has colon cancer.    Past Medical History:  Diagnosis Date  . Adenoma of colon   . Adenomatous colon polyp   . Anemia    IDA  . Cataract   . Cervical stenosis of spinal canal   . Chronic kidney disease    stage 3  . Diabetes mellitus without complication ([place])   . GERD (gastroesophageal reflux disease)   . Glaucoma   . Heme positive stool   . History of UTI   . Hx of gallstones   . Hyperkalemia   . Hyperlipidemia   . Hyperlipidemia   . Hypertension   . Lumbar stenosis   . Lymphedema   . Neuromuscular disorder ([place])   . Neuropathy associated with endocrine disorder ([place])   . Obesity   . OSA on CPAP    on CPAP  . Osteoarthritis   . Osteoarthritis   . Port-A-Cath in place   . Retinopathy due to secondary diabetes ([place])   . Secondary hyperparathyroidism of renal origin [facility])     Past Surgical History:  Procedure Laterality Date  . ACDF X2    . BACK SURGERY    . Cataract extraction right    . catarect extraction left    . CHOLECYSTECTOMY    . COLONOSCOPY WITH PROPOFOL N/A 12/02/2016   Procedure: COLONOSCOPY WITH PROPOFOL;  Surgeon: Manya Silvas, MD;  Location: [facility] ENDOSCOPY;  Service: Endoscopy;  Laterality: N/A;  . COLONOSCOPY WITH PROPOFOL N/A 11/10/2017   Procedure: COLONOSCOPY WITH PROPOFOL;  Surgeon: Toledo, Benay Pike, MD;  Location: [facility] ENDOSCOPY;  Service: Gastroenterology;  Laterality: N/A;  . colonoscopy with removal lesions by snare    . Endoscoic carpal tunnel release    . ESOPHAGOGASTRODUODENOSCOPY N/A 11/10/2017   Procedure: ESOPHAGOGASTRODUODENOSCOPY (EGD);  Surgeon: Toledo, Benay Pike, MD;  Location: [facility] ENDOSCOPY;  Service: Gastroenterology;  Laterality: N/A;  . ESOPHAGOGASTRODUODENOSCOPY (EGD) WITH PROPOFOL N/A 12/02/2016   Procedure:  ESOPHAGOGASTRODUODENOSCOPY (EGD) WITH PROPOFOL;  Surgeon: Manya Silvas, MD;  Location: [facility] ENDOSCOPY;  Service: Endoscopy;  Laterality: N/A;  . IR IMAGING GUIDED PORT INSERTION  10/01/2017  . Laminectomy posterior lumbar facetectomy and formaninotomy w/Decomp    . Laminectony posterior cervicle decomp w/Facectomy and foraminotomy    . VEIN LIGATION AND STRIPPING Left     Family History  Problem Relation Age of Onset  . Coronary artery disease Mother   . Diabetes type II Mother   . Hypertension Mother   . Tuberculosis Father   . Stroke Father   . Diabetes type II Sister   . Migraines Sister   . Alcohol abuse Brother   . Coronary artery disease Brother   . Diabetes type II Brother   . Kidney cancer Brother   . Kidney disease Brother   . Heart attack Brother   . Hypertension Brother     Social History:  reports that she has never smoked. She has never used smokeless tobacco. She reports that she does not drink alcohol or use drugs.She lives in [place]. The patient is alone today.  Allergies:  Allergies  Allergen Reactions  . Hydrocodone-Acetaminophen Other (See Comments)    Sedation and GI upset, pt is not allergic to acetaminophen  . Statins Other (See Comments)  . Tramadol-Acetaminophen Nausea And Vomiting    GI upset, pt is not allergic to acetaminophen    Current Medications: Current Outpatient Medications  Medication Sig Dispense Refill  . albuterol (PROVENTIL HFA;VENTOLIN HFA) 108 (90 Base) MCG/ACT inhaler Inhale 2 puffs into the lungs every 6 (six) hours as needed for wheezing or shortness of breath.    Marland Kitchen amLODipine (NORVASC) 5 MG tablet Take 10 mg by mouth daily.     Marland Kitchen aspirin EC 81 MG tablet Take 81 mg by mouth daily.    . ASSURE COMFORT LANCETS 30G MISC     . calcitRIOL (ROCALTROL) 0.25 MCG capsule Take 0.5 mcg by mouth 3 (three) times a week.     . carvedilol (COREG) 6.25 MG tablet Take 6.25 mg by mouth 2 (two) times daily.  12  . ferrous sulfate 325 (65 FE)  MG tablet Take 325 mg by mouth daily.    . furosemide (LASIX) 40 MG tablet Take 40 mg by mouth daily.     Marland Kitchen gabapentin (NEURONTIN) 100 MG capsule Take 100 mg by mouth 2 (two) times daily.     Marland Kitchen gemfibrozil (LOPID) 600 MG tablet Take 600 mg by mouth daily.     Marland Kitchen glimepiride (AMARYL) 1 MG tablet Take 0.5 mg by mouth daily with breakfast.     . hydrALAZINE (APRESOLINE) 50 MG tablet Take 25 mg by mouth 3 (three) times daily.   12  . Lancet Devices (ADJUSTABLE LANCING DEVICE) MISC     . latanoprost (XALATAN) 0.005 % ophthalmic solution Place 1 drop into both eyes.     Marland Kitchen losartan (  COZAAR) 50 MG tablet Take 100 mg by mouth daily.     . Multiple Vitamins-Minerals (CENTRUM SILVER) tablet Take 1 tablet by mouth daily.     . Omega-3 Fatty Acids (FISH OIL) 1000 MG CAPS Take 1,000 mg by mouth daily.     Marland Kitchen omeprazole (PRILOSEC) 20 MG capsule Take 20 mg by mouth daily.     . ONE TOUCH ULTRA TEST test strip     . pioglitazone (ACTOS) 30 MG tablet Take 30 mg by mouth daily.     Marland Kitchen acetaminophen (TYLENOL) 500 MG tablet Take 500 mg by mouth every 6 (six) hours as needed.     . lidocaine-prilocaine (EMLA) cream Apply 1 application topically as needed. (Patient not taking: Reported on 08/30/2018) 30 g 3   No current facility-administered medications for this visit.     Review of Systems  Constitutional: Negative for chills, diaphoresis, fever, malaise/fatigue (up 2 lbs.) and weight loss.       Doing good.  HENT: Negative for congestion, ear pain, hearing loss, sinus pain and sore throat.   Eyes: Negative for blurred vision.  Respiratory: Positive for shortness of breath (on exertion, improved) and wheezing (improved, uses inhaler).   Cardiovascular: Negative for chest pain, palpitations and leg swelling.       BP 115/66 yesterday, low for her.  Gastrointestinal: Negative for abdominal pain, constipation, diarrhea, nausea and vomiting.       Soft stool.  Genitourinary: Negative for dysuria, frequency and  urgency.  Musculoskeletal: Negative for back pain, joint pain and myalgias.  Neurological: Negative for dizziness, tingling, sensory change and weakness.  Endo/Heme/Allergies: Does not bruise/bleed easily.       Diabetes. No thyroid issues.  Psychiatric/Behavioral: Negative for depression and memory loss. The patient is not nervous/anxious and does not have insomnia.   All other systems reviewed and are negative.  Performance status (ECOG): 1  Vitals: Blood pressure (!) 155/68, pulse 73, temperature (!) 97.5 F (36.4 C), resp. rate 18, height 5\' 4"  (1.626 m), weight 261 lb 0.4 oz (118.4 kg), SpO2 100 %.   Physical Exam  Constitutional: She is oriented to person, place, and time. She appears well-developed and well-nourished. No distress.  HENT:  Head: Normocephalic and atraumatic.  Mouth/Throat: Oropharynx is clear and moist. No oropharyngeal exudate.  Short brown and black wig.  Eyes: Pupils are equal, round, and reactive to light. Conjunctivae and EOM are normal. No scleral icterus.  Glasses.  Brown eyes.  Neck: Normal range of motion. Neck supple.  Cardiovascular: Normal rate, regular rhythm and normal heart sounds.  No murmur heard. Pulmonary/Chest: Effort normal and breath sounds normal. No respiratory distress. She has no wheezes.  Abdominal: Soft. Bowel sounds are normal. She exhibits no mass. There is no abdominal tenderness. There is no rebound and no guarding.  Musculoskeletal: Normal range of motion.        General: No edema.     Comments: Large legs.  Left leg wrapped.  Lymphadenopathy:    She has no cervical adenopathy.  Neurological: She is alert and oriented to person, place, and time. She has normal reflexes.  Skin: Skin is warm and dry. She is not diaphoretic.  Psychiatric: She has a normal mood and affect. Her behavior is normal. Judgment and thought content normal.  Nursing note and vitals reviewed.   Appointment on 08/30/2018  Component Date Value Ref Range  Status  . Sodium 08/30/2018 139  135 - 145 mmol/L Final  . Potassium 08/30/2018 5.2*  3.5 - 5.1 mmol/L Final  . Chloride 08/30/2018 110  98 - 111 mmol/L Final  . CO2 08/30/2018 21* 22 - 32 mmol/L Final  . Glucose, Bld 08/30/2018 118* 70 - 99 mg/dL Final  . BUN 08/30/2018 45* 8 - 23 mg/dL Final  . Creatinine, Ser 08/30/2018 1.88* 0.44 - 1.00 mg/dL Final  . Calcium 08/30/2018 9.8  8.9 - 10.3 mg/dL Final  . GFR calc non Af Amer 08/30/2018 25* >60 mL/min Final  . GFR calc Af Amer 08/30/2018 29* >60 mL/min Final  . Anion gap 08/30/2018 8  5 - 15 Final   Performed at [facility], [address], [place], [place] [address]  . WBC 08/30/2018 4.4  4.0 - 10.5 K/uL Final  . RBC 08/30/2018 2.75* 3.87 - 5.11 MIL/uL Final  . Hemoglobin 08/30/2018 9.1* 12.0 - 15.0 g/dL Final  . HCT 08/30/2018 29.3* 36.0 - 46.0 % Final  . MCV 08/30/2018 106.5* 80.0 - 100.0 fL Final  . MCH 08/30/2018 33.1  26.0 - 34.0 pg Final  . MCHC 08/30/2018 31.1  30.0 - 36.0 g/dL Final  . RDW 08/30/2018 15.4  11.5 - 15.5 % Final  . Platelets 08/30/2018 152  150 - 400 K/uL Final  . nRBC 08/30/2018 0.0  0.0 - 0.2 % Final  . Neutrophils Relative % 08/30/2018 61  % Final  . Neutro Abs 08/30/2018 2.7  1.7 - 7.7 K/uL Final  . Lymphocytes Relative 08/30/2018 23  % Final  . Lymphs Abs 08/30/2018 1.0  0.7 - 4.0 K/uL Final  . Monocytes Relative 08/30/2018 12  % Final  . Monocytes Absolute 08/30/2018 0.5  0.1 - 1.0 K/uL Final  . Eosinophils Relative 08/30/2018 3  % Final  . Eosinophils Absolute 08/30/2018 0.1  0.0 - 0.5 K/uL Final  . Basophils Relative 08/30/2018 1  % Final  . Basophils Absolute 08/30/2018 0.1  0.0 - 0.1 K/uL Final  . Immature Granulocytes 08/30/2018 0  % Final  . Abs Immature Granulocytes 08/30/2018 0.01  0.00 - 0.07 K/uL Final   Performed at [facility], [address] [place], [place] [address]    Assessment:  Sara Bright is a 78 y.o. female with anemia of chronic kidney disease. She  has stage III chronic kidney disease. SPEP and free light chain ratio was normal on 11/12/2015.   She receivedAranesp 200 mcg on 09/02/2016, 12/22/2016, 03/23/2017, 07/20/2017, 08/17/2017, and 09/15/2017. She received Aranesp 300 mcgon 10/06/2017, 10/19/2017, 01/04/2018, 03/08/2018, and 05/10/2018.  She received Retacrit 10,000 units on 07/04/2018.  She received 1 unit of PRBCson 09/22/2017. She received Venoferx 4 (10/22/2015 - 11/05/2015), x4 (01/21/2016 - 02/11/2016), 11/24/2016, 06/22/2017, 09/14/2017, 10/01/2017, 10/12/2017, 10/26/2017, 11/16/2017, and 03/01/2018.  Ferritinhas been followed: 63 on 10/08/2015, 290 on 01/14/2016, 651 on 05/12/2016, 467 on 09/01/2016, 413 on 11/19/2016, 633 on 12/22/2016, 374 on 01/19/2017, 480 on 06/22/2017, 135 on 09/22/2017, 159 on 10/06/2017, 373 on 03/01/2018, 289 on 03/29/2018, and 257 on 07/04/2018.   EGDon 09/04/2019revealed an irregularZ-line irregular, at the gastroesophageal junction, normal stomach, and normal duodenum.Colonoscopyon 11/10/2017 revealed diverticulosis in the sigmoid colonand non-bleeding internal hemorrhoids.  She has macrocytic RBC indices.  B12, folate, and TSH were normal on 04/26/2018.  Symptomatically, she has has issues with fluid in her leg.  She is using CPAP at night.  She is having 3 soft bowel movements/day.  She denies any bleeding.   Plan: 1.   Review interval labs. 2.   Anemia of chronic renal disease Hematocrit 29.3.  Hemoglobin 9.1. Ferritin 217.  Maintain ferritin > 100.  Retacrit today. 3.Macrocytosis MCV 106.5. She has had macrocytic RBC indices since 10/01/2017. She has no known liver disease. B12 was 771, folate 29, and TSH 4.290 on 04/26/2018. 4.Renal insufficiency  Creatinine 1.88 (Cr 2.04 on 07/04/2018).  Patient to follow-up with Dr. Holley Raring 5.   RTC every 2 weeks x 4 for hemoglobin/hematocrit  and+/- Retacrit. 6.   Add ferritin to labs on 09/27/2018. 7.   RTC in 10 weeks for MD assessment, labs (CBC with differential, BMP, ferritin) and+/- Retacrit.  I discussed the assessment and treatment plan with the patient.  The patient was provided an opportunity to ask questions and all were answered.  The patient agreed with the plan and demonstrated an understanding of the instructions.  The patient was advised to call back if the symptoms worsen or if the condition fails to improve as anticipated.  I provided 15 minutes of face-to-face time during this this encounter and > 50% was spent counseling as documented under my assessment and plan.    Lequita Asal, MD, PhD    08/30/2018, 10:47 AM  I, Selena Batten, am acting as scribe for [employer]. Mike Gip, MD, PhD.  I, Melissa C. Mike Gip, MD, have reviewed the above documentation for accuracy and completeness, and I agree with the above.

## 2018-08-30 ENCOUNTER — Inpatient Hospital Stay: Payer: Medicare HMO

## 2018-08-30 ENCOUNTER — Other Ambulatory Visit: Payer: Self-pay

## 2018-08-30 ENCOUNTER — Encounter: Payer: Self-pay | Admitting: Hematology and Oncology

## 2018-08-30 ENCOUNTER — Telehealth: Payer: Self-pay

## 2018-08-30 ENCOUNTER — Inpatient Hospital Stay (HOSPITAL_BASED_OUTPATIENT_CLINIC_OR_DEPARTMENT_OTHER): Payer: Medicare HMO | Admitting: Hematology and Oncology

## 2018-08-30 VITALS — BP 155/68 | HR 73 | Temp 97.5°F | Resp 18 | Ht 64.0 in | Wt 261.0 lb

## 2018-08-30 DIAGNOSIS — N183 Chronic kidney disease, stage 3 unspecified: Secondary | ICD-10-CM

## 2018-08-30 DIAGNOSIS — D7589 Other specified diseases of blood and blood-forming organs: Secondary | ICD-10-CM

## 2018-08-30 DIAGNOSIS — D631 Anemia in chronic kidney disease: Secondary | ICD-10-CM | POA: Diagnosis not present

## 2018-08-30 LAB — BASIC METABOLIC PANEL
Anion gap: 8 (ref 5–15)
BUN: 45 mg/dL — ABNORMAL HIGH (ref 8–23)
CO2: 21 mmol/L — ABNORMAL LOW (ref 22–32)
Calcium: 9.8 mg/dL (ref 8.9–10.3)
Chloride: 110 mmol/L (ref 98–111)
Creatinine, Ser: 1.88 mg/dL — ABNORMAL HIGH (ref 0.44–1.00)
GFR calc Af Amer: 29 mL/min — ABNORMAL LOW (ref >60)
GFR calc non Af Amer: 25 mL/min — ABNORMAL LOW (ref >60)
Glucose, Bld: 118 mg/dL — ABNORMAL HIGH (ref 70–99)
Potassium: 5.2 mmol/L — ABNORMAL HIGH (ref 3.5–5.1)
Sodium: 139 mmol/L (ref 135–145)

## 2018-08-30 LAB — CBC WITH DIFFERENTIAL/PLATELET
Abs Immature Granulocytes: 0.01 10*3/uL (ref 0.00–0.07)
Basophils Absolute: 0.1 10*3/uL (ref 0.0–0.1)
Basophils Relative: 1 %
Eosinophils Absolute: 0.1 10*3/uL (ref 0.0–0.5)
Eosinophils Relative: 3 %
HCT: 29.3 % — ABNORMAL LOW (ref 36.0–46.0)
Hemoglobin: 9.1 g/dL — ABNORMAL LOW (ref 12.0–15.0)
Immature Granulocytes: 0 %
Lymphocytes Relative: 23 %
Lymphs Abs: 1 10*3/uL (ref 0.7–4.0)
MCH: 33.1 pg (ref 26.0–34.0)
MCHC: 31.1 g/dL (ref 30.0–36.0)
MCV: 106.5 fL — ABNORMAL HIGH (ref 80.0–100.0)
Monocytes Absolute: 0.5 10*3/uL (ref 0.1–1.0)
Monocytes Relative: 12 %
Neutro Abs: 2.7 10*3/uL (ref 1.7–7.7)
Neutrophils Relative %: 61 %
Platelets: 152 10*3/uL (ref 150–400)
RBC: 2.75 MIL/uL — ABNORMAL LOW (ref 3.87–5.11)
RDW: 15.4 % (ref 11.5–15.5)
WBC: 4.4 10*3/uL (ref 4.0–10.5)
nRBC: 0 % (ref 0.0–0.2)

## 2018-08-30 LAB — FERRITIN: Ferritin: 217 ng/mL (ref 11–307)

## 2018-08-30 MED ORDER — EPOETIN ALFA-EPBX 10000 UNIT/ML IJ SOLN
10000.0000 [IU] | Freq: Once | INTRAMUSCULAR | Status: AC
  Administered 2018-08-30: 10000 [IU] via SUBCUTANEOUS

## 2018-08-30 NOTE — Progress Notes (Signed)
No new changes noted today. Patient states she has ace wraps to right lower leg/ healing well/ but got 4 more weeks to continue to be wrapped due to drainage.Wound is followed by wound care( [facility] in [place]

## 2018-08-30 NOTE — Telephone Encounter (Signed)
Labs forwarded to Dr. Holley Raring for review. Elevated potassium.

## 2018-09-02 ENCOUNTER — Encounter (INDEPENDENT_AMBULATORY_CARE_PROVIDER_SITE_OTHER): Payer: Self-pay | Admitting: Nurse Practitioner

## 2018-09-02 ENCOUNTER — Other Ambulatory Visit: Payer: Self-pay

## 2018-09-02 ENCOUNTER — Ambulatory Visit (INDEPENDENT_AMBULATORY_CARE_PROVIDER_SITE_OTHER): Payer: Medicare HMO | Admitting: Nurse Practitioner

## 2018-09-02 ENCOUNTER — Encounter (INDEPENDENT_AMBULATORY_CARE_PROVIDER_SITE_OTHER): Payer: Self-pay

## 2018-09-02 VITALS — BP 144/71 | HR 73 | Resp 16 | Wt 258.6 lb

## 2018-09-02 DIAGNOSIS — I83029 Varicose veins of left lower extremity with ulcer of unspecified site: Secondary | ICD-10-CM | POA: Diagnosis not present

## 2018-09-02 DIAGNOSIS — L97929 Non-pressure chronic ulcer of unspecified part of left lower leg with unspecified severity: Secondary | ICD-10-CM | POA: Diagnosis not present

## 2018-09-02 NOTE — Progress Notes (Signed)
SUBJECTIVE:  Patient ID: Sara Bright, female    DOB: June 17, 1940, 78 y.o.   MRN: [id number] Chief Complaint  Patient presents with  . Follow-up    unna follow up    HPI  Sara Bright is a 78 y.o. female Patient is seen for follow up evaluation of leg pain and swelling associated with venous ulceration. The patient was recently seen here and started on Unna boot therapy.  The swelling abruptly became much worse bilaterally and is associated with pain and discoloration. The pain and swelling worsens with prolonged dependency and improves with elevation.  The patient notes that in the morning the legs are better but the leg symptoms worsened throughout the course of the day. The patient has also noted a progressive worsening of the discoloration in the ankle and shin area.   The patient notes that an ulcer has developed acutely without specific trauma and since it occurred it has been very slow to heal.  There is a moderate amount of drainage associated with the open area.  The wound is also very painful.  The patient states that they have been elevating as much as possible. The patient denies any recent changes in medications.  The patient denies a history of DVT or PE. There is no prior history of phlebitis. There is no history of primary lymphedema.  No SOB or increased cough.  No sputum production.  No recent episodes of CHF exacerbation.   Past Medical History:  Diagnosis Date  . Adenoma of colon   . Adenomatous colon polyp   . Anemia    IDA  . Cataract   . Cervical stenosis of spinal canal   . Chronic kidney disease    stage 3  . Diabetes mellitus without complication ([place])   . GERD (gastroesophageal reflux disease)   . Glaucoma   . Heme positive stool   . History of UTI   . Hx of gallstones   . Hyperkalemia   . Hyperlipidemia   . Hyperlipidemia   . Hypertension   . Lumbar stenosis   . Lymphedema   . Neuromuscular disorder ([place])   . Neuropathy associated with  endocrine disorder ([place])   . Obesity   . OSA on CPAP    on CPAP  . Osteoarthritis   . Osteoarthritis   . Port-A-Cath in place   . Retinopathy due to secondary diabetes ([place])   . Secondary hyperparathyroidism of renal origin [facility])     Past Surgical History:  Procedure Laterality Date  . ACDF X2    . BACK SURGERY    . Cataract extraction right    . catarect extraction left    . CHOLECYSTECTOMY    . COLONOSCOPY WITH PROPOFOL N/A 12/02/2016   Procedure: COLONOSCOPY WITH PROPOFOL;  Surgeon: Manya Silvas, MD;  Location: [facility] ENDOSCOPY;  Service: Endoscopy;  Laterality: N/A;  . COLONOSCOPY WITH PROPOFOL N/A 11/10/2017   Procedure: COLONOSCOPY WITH PROPOFOL;  Surgeon: Toledo, Benay Pike, MD;  Location: [facility] ENDOSCOPY;  Service: Gastroenterology;  Laterality: N/A;  . colonoscopy with removal lesions by snare    . Endoscoic carpal tunnel release    . ESOPHAGOGASTRODUODENOSCOPY N/A 11/10/2017   Procedure: ESOPHAGOGASTRODUODENOSCOPY (EGD);  Surgeon: Toledo, Benay Pike, MD;  Location: [facility] ENDOSCOPY;  Service: Gastroenterology;  Laterality: N/A;  . ESOPHAGOGASTRODUODENOSCOPY (EGD) WITH PROPOFOL N/A 12/02/2016   Procedure: ESOPHAGOGASTRODUODENOSCOPY (EGD) WITH PROPOFOL;  Surgeon: Manya Silvas, MD;  Location: [facility] ENDOSCOPY;  Service: Endoscopy;  Laterality: N/A;  . IR  IMAGING GUIDED PORT INSERTION  10/01/2017  . Laminectomy posterior lumbar facetectomy and formaninotomy w/Decomp    . Laminectony posterior cervicle decomp w/Facectomy and foraminotomy    . VEIN LIGATION AND STRIPPING Left     Social History   Socioeconomic History  . Marital status: Widowed    Spouse name: Not on file  . Number of children: Not on file  . Years of education: Not on file  . Highest education level: Not on file  Occupational History  . Not on file  Social Needs  . Financial resource strain: Not on file  . Food insecurity    Worry: Not on file    Inability: Not on file  . Transportation needs     Medical: Not on file    Non-medical: Not on file  Tobacco Use  . Smoking status: Never Smoker  . Smokeless tobacco: Never Used  Substance and Sexual Activity  . Alcohol use: No  . Drug use: No  . Sexual activity: Not on file  Lifestyle  . Physical activity    Days per week: Not on file    Minutes per session: Not on file  . Stress: Not on file  Relationships  . Social Herbalist on phone: Not on file    Gets together: Not on file    Attends religious service: Not on file    Active member of club or organization: Not on file    Attends meetings of clubs or organizations: Not on file    Relationship status: Not on file  . Intimate partner violence    Fear of current or ex partner: Not on file    Emotionally abused: Not on file    Physically abused: Not on file    Forced sexual activity: Not on file  Other Topics Concern  . Not on file  Social History Narrative  . Not on file    Family History  Problem Relation Age of Onset  . Coronary artery disease Mother   . Diabetes type II Mother   . Hypertension Mother   . Tuberculosis Father   . Stroke Father   . Diabetes type II Sister   . Migraines Sister   . Alcohol abuse Brother   . Coronary artery disease Brother   . Diabetes type II Brother   . Kidney cancer Brother   . Kidney disease Brother   . Heart attack Brother   . Hypertension Brother     Allergies  Allergen Reactions  . Hydrocodone-Acetaminophen Other (See Comments)    Sedation and GI upset, pt is not allergic to acetaminophen  . Statins Other (See Comments)  . Tramadol-Acetaminophen Nausea And Vomiting    GI upset, pt is not allergic to acetaminophen     Review of Systems   Review of Systems: Negative Unless Checked Constitutional: [] Weight loss  [] Fever  [] Chills Cardiac: [] Chest pain   []  Atrial Fibrillation  [] Palpitations   [] Shortness of breath when laying flat   [] Shortness of breath with exertion. [] Shortness of breath at rest  Vascular:  [] Pain in legs with walking   [] Pain in legs with standing [] Pain in legs when laying flat   [] Claudication    [] Pain in feet when laying flat    [] History of DVT   [] Phlebitis   [x] Swelling in legs   [] Varicose veins   [x] Non-healing ulcers Pulmonary:   [] Uses home oxygen   [] Productive cough   [] Hemoptysis   [] Wheeze  [] COPD   []   Asthma Neurologic:  [] Dizziness   [] Seizures  [] Blackouts [] History of stroke   [] History of TIA  [] Aphasia   [] Temporary Blindness   [] Weakness or numbness in arm   [] Weakness or numbness in leg Musculoskeletal:   [] Joint swelling   [] Joint pain   [] Low back pain  []  History of Knee Replacement [x] Arthritis [] back Surgeries  []  Spinal Stenosis    Hematologic:  [] Easy bruising  [] Easy bleeding   [] Hypercoagulable state   [x] Anemic Gastrointestinal:  [] Diarrhea   [] Vomiting  [] Gastroesophageal reflux/heartburn   [] Difficulty swallowing. [] Abdominal pain Genitourinary:  [] Chronic kidney disease   [] Difficult urination  [] Anuric   [] Blood in urine [] Frequent urination  [] Burning with urination   [] Hematuria Skin:  [] Rashes   [] Ulcers [] Wounds Psychological:  [] History of anxiety   []  History of major depression  []  Memory Difficulties      OBJECTIVE:   Physical Exam  BP (!) 158/73 (BP Location: Left Arm)   Pulse 73   Resp 16   Wt 259 lb 9.6 oz (117.8 kg)   BMI 44.56 kg/m   Gen: WD/WN, NAD Head: [place]/AT, No temporalis wasting.  Ear/Nose/Throat: Hearing grossly intact, nares w/o erythema or drainage Eyes: PER, EOMI, sclera nonicteric.  Neck: Supple, no masses.  No JVD.  Pulmonary:  Good air movement, no use of accessory muscles.  Cardiac: RRR Vascular: 2-3+ edema of the left leg with severe venous changes of the left leg.  Venous ulcer noted in the ankle area on the left, noninfected  Venous stasis dermatitis with ulcers present on the left Vessel Right Left  Radial Palpable Palpable  Dorsalis Pedis Palpable Palpable  Posterior Tibial Palpable  Palpable   Gastrointestinal: soft, non-distended. No guarding/no peritoneal signs.  Musculoskeletal: M/S 5/5 throughout.  No deformity or atrophy.  Neurologic: Pain and light touch intact in extremities.  Symmetrical.  Speech is fluent. Motor exam as listed above. Psychiatric: Judgment intact, Mood & affect appropriate for pt's clinical situation. Dermatologic:   No changes consistent with cellulitis. Lymph : No Cervical lymphadenopathy, no lichenification or skin changes of chronic lymphedema.       ASSESSMENT AND PLAN:  1. Venous ulcer of left leg (HCC) No surgery or intervention at this point in time.    I have had a long discussion with the patient regarding venous insufficiency and why it  causes symptoms, specifically venous ulceration . I have discussed with the patient the chronic skin changes that accompany venous insufficiency and the long term sequela such as infection and recurring  ulceration.  Patient will be placed in [employer] which will be changed weekly drainage permitting.  In addition, behavioral modification including several periods of elevation of the lower extremities during the day will be continued. Achieving a position with the ankles at heart level was stressed to the patient  The patient is instructed to begin routine exercise, especially walking on a daily basis    2. Essential hypertension Continue antihypertensive medications as already ordered, these medications have been reviewed and there are no changes at this time.   3. Lymphedema No surgery or intervention at this point in time.  I have reviewed my discussion with the patient regarding venous insufficiency and why it causes symptoms. I have discussed with the patient the chronic skin changes that accompany venous insufficiency and the long term sequela such as ulceration. Patient will contnue wearing graduated compression stockings on a daily basis, as this has provided excellent control of his  edema. The patient will put  the stockings on first thing in the morning and removing them in the evening. The patient is reminded not to sleep in the stockings.  In addition, behavioral modification including elevation during the day will be initiated. Exercise is strongly encouraged.    Current Outpatient Medications on File Prior to Visit  Medication Sig Dispense Refill  . acetaminophen (TYLENOL) 500 MG tablet Take 500 mg by mouth every 6 (six) hours as needed.     Marland Kitchen albuterol (PROVENTIL HFA;VENTOLIN HFA) 108 (90 Base) MCG/ACT inhaler Inhale 2 puffs into the lungs every 6 (six) hours as needed for wheezing or shortness of breath.    Marland Kitchen amLODipine (NORVASC) 5 MG tablet Take 10 mg by mouth daily.     Marland Kitchen aspirin EC 81 MG tablet Take 81 mg by mouth daily.    . ASSURE COMFORT LANCETS 30G MISC     . calcitRIOL (ROCALTROL) 0.25 MCG capsule Take 0.5 mcg by mouth 3 (three) times a week.     . carvedilol (COREG) 6.25 MG tablet Take 6.25 mg by mouth 2 (two) times daily.  12  . ferrous sulfate 325 (65 FE) MG tablet Take 325 mg by mouth daily.    . furosemide (LASIX) 40 MG tablet Take 40 mg by mouth daily.     Marland Kitchen gabapentin (NEURONTIN) 100 MG capsule Take 100 mg by mouth 2 (two) times daily.     Marland Kitchen gemfibrozil (LOPID) 600 MG tablet Take 600 mg by mouth daily.     Marland Kitchen glimepiride (AMARYL) 1 MG tablet Take 0.5 mg by mouth daily with breakfast.     . hydrALAZINE (APRESOLINE) 50 MG tablet Take 25 mg by mouth 3 (three) times daily.   12  . Lancet Devices (ADJUSTABLE LANCING DEVICE) MISC     . latanoprost (XALATAN) 0.005 % ophthalmic solution Place 1 drop into both eyes.     Marland Kitchen lidocaine-prilocaine (EMLA) cream Apply 1 application topically as needed. (Patient not taking: Reported on 08/30/2018) 30 g 3  . losartan (COZAAR) 50 MG tablet Take 100 mg by mouth daily.     . Multiple Vitamins-Minerals (CENTRUM SILVER) tablet Take 1 tablet by mouth daily.     . Omega-3 Fatty Acids (FISH OIL) 1000 MG CAPS Take 1,000 mg by  mouth daily.     Marland Kitchen omeprazole (PRILOSEC) 20 MG capsule Take 20 mg by mouth daily.     . ONE TOUCH ULTRA TEST test strip     . pioglitazone (ACTOS) 30 MG tablet Take 30 mg by mouth daily.      No current facility-administered medications on file prior to visit.     There are no Patient Instructions on file for this visit. No follow-ups on file.   Kris Hartmann, NP  This note was completed with Sales executive.  Any errors are purely unintentional.

## 2018-09-02 NOTE — Progress Notes (Signed)
History of Present Illness  There is no documented history at this time  Assessments & Plan   There are no diagnoses linked to this encounter.    Additional instructions  Subjective:  Patient presents with venous ulcer of the Left lower extremity.    Procedure:  3 layer unna wrap was placed Left lower extremity.   Plan:   Follow up in one week.  

## 2018-09-08 ENCOUNTER — Ambulatory Visit (INDEPENDENT_AMBULATORY_CARE_PROVIDER_SITE_OTHER): Payer: Medicare HMO | Admitting: Nurse Practitioner

## 2018-09-08 ENCOUNTER — Other Ambulatory Visit: Payer: Self-pay

## 2018-09-08 ENCOUNTER — Encounter (INDEPENDENT_AMBULATORY_CARE_PROVIDER_SITE_OTHER): Payer: Self-pay

## 2018-09-08 VITALS — BP 133/66 | HR 76 | Resp 22 | Ht 64.0 in | Wt 263.0 lb

## 2018-09-08 DIAGNOSIS — L97929 Non-pressure chronic ulcer of unspecified part of left lower leg with unspecified severity: Secondary | ICD-10-CM | POA: Diagnosis not present

## 2018-09-08 DIAGNOSIS — I83029 Varicose veins of left lower extremity with ulcer of unspecified site: Secondary | ICD-10-CM

## 2018-09-08 NOTE — Progress Notes (Signed)
History of Present Illness  There is no documented history at this time  Assessments & Plan   There are no diagnoses linked to this encounter.    Additional instructions  Subjective:  Patient presents with venous ulcer of the Left lower extremity.    Procedure:  3 layer unna wrap was placed Left lower extremity.   Plan:   Follow up in one week.  

## 2018-09-13 ENCOUNTER — Inpatient Hospital Stay: Payer: Medicare HMO

## 2018-09-13 ENCOUNTER — Other Ambulatory Visit: Payer: Self-pay

## 2018-09-13 ENCOUNTER — Inpatient Hospital Stay: Payer: Medicare HMO | Attending: Hematology and Oncology

## 2018-09-13 VITALS — BP 125/65 | HR 73 | Temp 98.1°F | Resp 21

## 2018-09-13 DIAGNOSIS — N183 Chronic kidney disease, stage 3 unspecified: Secondary | ICD-10-CM

## 2018-09-13 DIAGNOSIS — D631 Anemia in chronic kidney disease: Secondary | ICD-10-CM | POA: Diagnosis present

## 2018-09-13 LAB — HEMOGLOBIN AND HEMATOCRIT, BLOOD
HCT: 29.8 % — ABNORMAL LOW (ref 36.0–46.0)
Hemoglobin: 9.3 g/dL — ABNORMAL LOW (ref 12.0–15.0)

## 2018-09-13 MED ORDER — SODIUM CHLORIDE 0.9% FLUSH
10.0000 mL | INTRAVENOUS | Status: DC | PRN
  Administered 2018-09-13: 14:00:00 10 mL via INTRAVENOUS
  Filled 2018-09-13: qty 10

## 2018-09-13 MED ORDER — HEPARIN SOD (PORK) LOCK FLUSH 100 UNIT/ML IV SOLN
500.0000 [IU] | Freq: Once | INTRAVENOUS | Status: AC
  Administered 2018-09-13: 14:00:00 500 [IU] via INTRAVENOUS

## 2018-09-13 MED ORDER — EPOETIN ALFA-EPBX 10000 UNIT/ML IJ SOLN
10000.0000 [IU] | Freq: Once | INTRAMUSCULAR | Status: AC
  Administered 2018-09-13: 14:00:00 10000 [IU] via SUBCUTANEOUS
  Filled 2018-09-13: qty 1

## 2018-09-15 ENCOUNTER — Ambulatory Visit (INDEPENDENT_AMBULATORY_CARE_PROVIDER_SITE_OTHER): Payer: Medicare HMO | Admitting: Nurse Practitioner

## 2018-09-15 ENCOUNTER — Encounter (INDEPENDENT_AMBULATORY_CARE_PROVIDER_SITE_OTHER): Payer: Self-pay | Admitting: Nurse Practitioner

## 2018-09-15 ENCOUNTER — Other Ambulatory Visit: Payer: Self-pay

## 2018-09-15 VITALS — BP 156/64 | HR 75 | Resp 18 | Ht 64.0 in | Wt 263.0 lb

## 2018-09-15 DIAGNOSIS — I890 Lymphedema, not elsewhere classified: Secondary | ICD-10-CM

## 2018-09-15 DIAGNOSIS — I10 Essential (primary) hypertension: Secondary | ICD-10-CM

## 2018-09-15 DIAGNOSIS — I83029 Varicose veins of left lower extremity with ulcer of unspecified site: Secondary | ICD-10-CM

## 2018-09-15 DIAGNOSIS — L97929 Non-pressure chronic ulcer of unspecified part of left lower leg with unspecified severity: Secondary | ICD-10-CM

## 2018-09-15 DIAGNOSIS — Z79899 Other long term (current) drug therapy: Secondary | ICD-10-CM

## 2018-09-20 ENCOUNTER — Encounter (INDEPENDENT_AMBULATORY_CARE_PROVIDER_SITE_OTHER): Payer: Self-pay | Admitting: Nurse Practitioner

## 2018-09-20 NOTE — Progress Notes (Signed)
SUBJECTIVE:  Patient ID: Sara Bright, female    DOB: Aug 07, 1940, 78 y.o.   MRN: [id number] Chief Complaint  Patient presents with  . Follow-up    unna boot check    HPI  Sara Bright is a 78 y.o. female that presents today for evaluation of her left lower extremity ulceration.  Today the wound is completely healed.  The patient denies any pain or drainage associated with the site that she had previously.  She denies any fever, chills, nausea or vomiting.  She denies any chest pain or shortness of breath.    Past Medical History:  Diagnosis Date  . Adenoma of colon   . Adenomatous colon polyp   . Anemia    IDA  . Cataract   . Cervical stenosis of spinal canal   . Chronic kidney disease    stage 3  . Diabetes mellitus without complication ([place])   . GERD (gastroesophageal reflux disease)   . Glaucoma   . Heme positive stool   . History of UTI   . Hx of gallstones   . Hyperkalemia   . Hyperlipidemia   . Hyperlipidemia   . Hypertension   . Lumbar stenosis   . Lymphedema   . Neuromuscular disorder ([place])   . Neuropathy associated with endocrine disorder ([place])   . Obesity   . OSA on CPAP    on CPAP  . Osteoarthritis   . Osteoarthritis   . Port-A-Cath in place   . Retinopathy due to secondary diabetes ([place])   . Secondary hyperparathyroidism of renal origin [facility])     Past Surgical History:  Procedure Laterality Date  . ACDF X2    . BACK SURGERY    . Cataract extraction right    . catarect extraction left    . CHOLECYSTECTOMY    . COLONOSCOPY WITH PROPOFOL N/A 12/02/2016   Procedure: COLONOSCOPY WITH PROPOFOL;  Surgeon: Manya Silvas, MD;  Location: [facility] ENDOSCOPY;  Service: Endoscopy;  Laterality: N/A;  . COLONOSCOPY WITH PROPOFOL N/A 11/10/2017   Procedure: COLONOSCOPY WITH PROPOFOL;  Surgeon: Toledo, Benay Pike, MD;  Location: [facility] ENDOSCOPY;  Service: Gastroenterology;  Laterality: N/A;  . colonoscopy with removal lesions by snare    . Endoscoic carpal  tunnel release    . ESOPHAGOGASTRODUODENOSCOPY N/A 11/10/2017   Procedure: ESOPHAGOGASTRODUODENOSCOPY (EGD);  Surgeon: Toledo, Benay Pike, MD;  Location: [facility] ENDOSCOPY;  Service: Gastroenterology;  Laterality: N/A;  . ESOPHAGOGASTRODUODENOSCOPY (EGD) WITH PROPOFOL N/A 12/02/2016   Procedure: ESOPHAGOGASTRODUODENOSCOPY (EGD) WITH PROPOFOL;  Surgeon: Manya Silvas, MD;  Location: [facility] ENDOSCOPY;  Service: Endoscopy;  Laterality: N/A;  . IR IMAGING GUIDED PORT INSERTION  10/01/2017  . Laminectomy posterior lumbar facetectomy and formaninotomy w/Decomp    . Laminectony posterior cervicle decomp w/Facectomy and foraminotomy    . VEIN LIGATION AND STRIPPING Left     Social History   Socioeconomic History  . Marital status: Widowed    Spouse name: Not on file  . Number of children: Not on file  . Years of education: Not on file  . Highest education level: Not on file  Occupational History  . Not on file  Social Needs  . Financial resource strain: Not on file  . Food insecurity    Worry: Not on file    Inability: Not on file  . Transportation needs    Medical: Not on file    Non-medical: Not on file  Tobacco Use  . Smoking status: Never Smoker  .  Smokeless tobacco: Never Used  Substance and Sexual Activity  . Alcohol use: No  . Drug use: No  . Sexual activity: Not on file  Lifestyle  . Physical activity    Days per week: Not on file    Minutes per session: Not on file  . Stress: Not on file  Relationships  . Social Herbalist on phone: Not on file    Gets together: Not on file    Attends religious service: Not on file    Active member of club or organization: Not on file    Attends meetings of clubs or organizations: Not on file    Relationship status: Not on file  . Intimate partner violence    Fear of current or ex partner: Not on file    Emotionally abused: Not on file    Physically abused: Not on file    Forced sexual activity: Not on file  Other Topics  Concern  . Not on file  Social History Narrative  . Not on file    Family History  Problem Relation Age of Onset  . Coronary artery disease Mother   . Diabetes type II Mother   . Hypertension Mother   . Tuberculosis Father   . Stroke Father   . Diabetes type II Sister   . Migraines Sister   . Alcohol abuse Brother   . Coronary artery disease Brother   . Diabetes type II Brother   . Kidney cancer Brother   . Kidney disease Brother   . Heart attack Brother   . Hypertension Brother     Allergies  Allergen Reactions  . Hydrocodone-Acetaminophen Other (See Comments)    Sedation and GI upset, pt is not allergic to acetaminophen  . Statins Other (See Comments)  . Tramadol-Acetaminophen Nausea And Vomiting    GI upset, pt is not allergic to acetaminophen     Review of Systems   Review of Systems: Negative Unless Checked Constitutional: [] Weight loss  [] Fever  [] Chills Cardiac: [] Chest pain   []  Atrial Fibrillation  [] Palpitations   [] Shortness of breath when laying flat   [] Shortness of breath with exertion. [] Shortness of breath at rest Vascular:  [] Pain in legs with walking   [] Pain in legs with standing [] Pain in legs when laying flat   [] Claudication    [] Pain in feet when laying flat    [] History of DVT   [] Phlebitis   [x] Swelling in legs   [x] Varicose veins   [] Non-healing ulcers Pulmonary:   [] Uses home oxygen   [] Productive cough   [] Hemoptysis   [] Wheeze  [] COPD   [] Asthma Neurologic:  [] Dizziness   [] Seizures  [] Blackouts [] History of stroke   [] History of TIA  [] Aphasia   [] Temporary Blindness   [] Weakness or numbness in arm   [] Weakness or numbness in leg Musculoskeletal:   [] Joint swelling   [] Joint pain   [] Low back pain  []  History of Knee Replacement [] Arthritis [] back Surgeries  []  Spinal Stenosis    Hematologic:  [] Easy bruising  [] Easy bleeding   [] Hypercoagulable state   [x] Anemic Gastrointestinal:  [] Diarrhea   [] Vomiting  [] Gastroesophageal reflux/heartburn    [] Difficulty swallowing. [] Abdominal pain Genitourinary:  [x] Chronic kidney disease   [] Difficult urination  [] Anuric   [] Blood in urine [] Frequent urination  [] Burning with urination   [] Hematuria Skin:  [] Rashes   [] Ulcers [] Wounds Psychological:  [] History of anxiety   []  History of major depression  []  Memory Difficulties  OBJECTIVE:   Physical Exam  BP (!) 156/64 (BP Location: Left Arm)   Pulse 75   Resp 18   Ht 5\' 4"  (1.626 m)   Wt 263 lb (119.3 kg)   BMI 45.14 kg/m   Gen: WD/WN, NAD Head: [place]/AT, No temporalis wasting.  Ear/Nose/Throat: Hearing grossly intact, nares w/o erythema or drainage Eyes: PER, EOMI, sclera nonicteric.  Neck: Supple, no masses.  No JVD.  Pulmonary:  Good air movement, no use of accessory muscles.  Cardiac: RRR Vascular: 1+ edema bilaterally, left ulceration healed Vessel Right Left  Radial Palpable Palpable   Gastrointestinal: soft, non-distended. No guarding/no peritoneal signs.  Musculoskeletal: M/S 5/5 throughout.  No deformity or atrophy.  Neurologic: Pain and light touch intact in extremities.  Symmetrical.  Speech is fluent. Motor exam as listed above. Psychiatric: Judgment intact, Mood & affect appropriate for pt's clinical situation. Dermatologic: bilateral stasis dermatitis. No Ulcers Noted.  No changes consistent with cellulitis. Lymph : No Cervical lymphadenopathy, no lichenification or skin changes of chronic lymphedema.       ASSESSMENT AND PLAN:  1. Venous ulcer of left leg (HCC) Left lower extremity ulcer is healed.  Patient will follow conservative therapy below.    2. Lymphedema No surgery or intervention at this point in time.  I have reviewed my discussion with the patient regarding venous insufficiency and why it causes symptoms. I have discussed with the patient the chronic skin changes that accompany venous insufficiency and the long term sequela such as ulceration. Patient will contnue wearing graduated compression  stockings on a daily basis, as this has provided excellent control of his edema. The patient will put the stockings on first thing in the morning and removing them in the evening. The patient is reminded not to sleep in the stockings.  In addition, behavioral modification including elevation during the day will be initiated. Exercise is strongly encouraged.  Patient will follow up in 3 months.    3. Essential hypertension Continue antihypertensive medications as already ordered, these medications have been reviewed and there are no changes at this time.    Current Outpatient Medications on File Prior to Visit  Medication Sig Dispense Refill  . acetaminophen (TYLENOL) 500 MG tablet Take 500 mg by mouth every 6 (six) hours as needed.     Marland Kitchen albuterol (PROVENTIL HFA;VENTOLIN HFA) 108 (90 Base) MCG/ACT inhaler Inhale 2 puffs into the lungs every 6 (six) hours as needed for wheezing or shortness of breath.    Marland Kitchen amLODipine (NORVASC) 5 MG tablet Take 10 mg by mouth daily.     Marland Kitchen aspirin EC 81 MG tablet Take 81 mg by mouth daily.    . ASSURE COMFORT LANCETS 30G MISC     . calcitRIOL (ROCALTROL) 0.25 MCG capsule Take 0.5 mcg by mouth 3 (three) times a week.     . carvedilol (COREG) 6.25 MG tablet Take 6.25 mg by mouth 2 (two) times daily.  12  . ferrous sulfate 325 (65 FE) MG tablet Take 325 mg by mouth daily.    . furosemide (LASIX) 40 MG tablet Take 40 mg by mouth daily.     Marland Kitchen gabapentin (NEURONTIN) 100 MG capsule Take 100 mg by mouth 2 (two) times daily.     Marland Kitchen gemfibrozil (LOPID) 600 MG tablet Take 600 mg by mouth daily.     Marland Kitchen glimepiride (AMARYL) 1 MG tablet Take 0.5 mg by mouth daily with breakfast.     . hydrALAZINE (APRESOLINE) 50 MG tablet Take  25 mg by mouth 3 (three) times daily.   12  . Lancet Devices (ADJUSTABLE LANCING DEVICE) MISC     . latanoprost (XALATAN) 0.005 % ophthalmic solution Place 1 drop into both eyes.     Marland Kitchen lidocaine-prilocaine (EMLA) cream Apply 1 application topically  as needed. 30 g 3  . losartan (COZAAR) 50 MG tablet Take 100 mg by mouth daily.     . Multiple Vitamins-Minerals (CENTRUM SILVER) tablet Take 1 tablet by mouth daily.     . Omega-3 Fatty Acids (FISH OIL) 1000 MG CAPS Take 1,000 mg by mouth daily.     Marland Kitchen omeprazole (PRILOSEC) 20 MG capsule Take 20 mg by mouth daily.     . ONE TOUCH ULTRA TEST test strip     . pioglitazone (ACTOS) 30 MG tablet Take 30 mg by mouth daily.      No current facility-administered medications on file prior to visit.     There are no Patient Instructions on file for this visit. No follow-ups on file.   Kris Hartmann, NP  This note was completed with Sales executive.  Any errors are purely unintentional.

## 2018-09-27 ENCOUNTER — Inpatient Hospital Stay: Payer: Medicare HMO

## 2018-09-27 ENCOUNTER — Other Ambulatory Visit: Payer: Self-pay

## 2018-09-27 VITALS — BP 146/68 | HR 69 | Temp 98.1°F | Resp 18

## 2018-09-27 DIAGNOSIS — N183 Chronic kidney disease, stage 3 unspecified: Secondary | ICD-10-CM

## 2018-09-27 DIAGNOSIS — D631 Anemia in chronic kidney disease: Secondary | ICD-10-CM

## 2018-09-27 LAB — HEMOGLOBIN AND HEMATOCRIT, BLOOD
HCT: 29.1 % — ABNORMAL LOW (ref 36.0–46.0)
Hemoglobin: 9.1 g/dL — ABNORMAL LOW (ref 12.0–15.0)

## 2018-09-27 MED ORDER — EPOETIN ALFA-EPBX 10000 UNIT/ML IJ SOLN
10000.0000 [IU] | Freq: Once | INTRAMUSCULAR | Status: AC
  Administered 2018-09-27: 10000 [IU] via SUBCUTANEOUS

## 2018-09-27 MED ORDER — SODIUM CHLORIDE 0.9% FLUSH
10.0000 mL | INTRAVENOUS | Status: DC | PRN
  Administered 2018-09-27: 10 mL via INTRAVENOUS
  Filled 2018-09-27: qty 10

## 2018-09-27 MED ORDER — HEPARIN SOD (PORK) LOCK FLUSH 100 UNIT/ML IV SOLN
500.0000 [IU] | Freq: Once | INTRAVENOUS | Status: AC
  Administered 2018-09-27: 500 [IU] via INTRAVENOUS

## 2018-09-27 NOTE — Patient Instructions (Signed)

## 2018-10-01 ENCOUNTER — Encounter: Payer: Self-pay | Admitting: Hematology and Oncology

## 2018-10-10 ENCOUNTER — Other Ambulatory Visit: Payer: Self-pay

## 2018-10-11 ENCOUNTER — Inpatient Hospital Stay: Payer: Medicare HMO

## 2018-10-11 ENCOUNTER — Inpatient Hospital Stay: Payer: Medicare HMO | Attending: Hematology and Oncology

## 2018-10-11 VITALS — BP 135/69 | HR 69 | Temp 97.8°F | Resp 20

## 2018-10-11 DIAGNOSIS — N183 Chronic kidney disease, stage 3 unspecified: Secondary | ICD-10-CM

## 2018-10-11 DIAGNOSIS — Z8051 Family history of malignant neoplasm of kidney: Secondary | ICD-10-CM | POA: Insufficient documentation

## 2018-10-11 DIAGNOSIS — N289 Disorder of kidney and ureter, unspecified: Secondary | ICD-10-CM | POA: Diagnosis not present

## 2018-10-11 DIAGNOSIS — D631 Anemia in chronic kidney disease: Secondary | ICD-10-CM

## 2018-10-11 DIAGNOSIS — R0602 Shortness of breath: Secondary | ICD-10-CM | POA: Diagnosis not present

## 2018-10-11 DIAGNOSIS — R062 Wheezing: Secondary | ICD-10-CM | POA: Insufficient documentation

## 2018-10-11 DIAGNOSIS — Z823 Family history of stroke: Secondary | ICD-10-CM | POA: Insufficient documentation

## 2018-10-11 DIAGNOSIS — Z8249 Family history of ischemic heart disease and other diseases of the circulatory system: Secondary | ICD-10-CM | POA: Insufficient documentation

## 2018-10-11 DIAGNOSIS — I10 Essential (primary) hypertension: Secondary | ICD-10-CM | POA: Diagnosis not present

## 2018-10-11 DIAGNOSIS — Z833 Family history of diabetes mellitus: Secondary | ICD-10-CM | POA: Diagnosis not present

## 2018-10-11 DIAGNOSIS — Z825 Family history of asthma and other chronic lower respiratory diseases: Secondary | ICD-10-CM | POA: Insufficient documentation

## 2018-10-11 DIAGNOSIS — D7589 Other specified diseases of blood and blood-forming organs: Secondary | ICD-10-CM | POA: Diagnosis not present

## 2018-10-11 DIAGNOSIS — Z811 Family history of alcohol abuse and dependence: Secondary | ICD-10-CM | POA: Insufficient documentation

## 2018-10-11 DIAGNOSIS — Z79899 Other long term (current) drug therapy: Secondary | ICD-10-CM | POA: Insufficient documentation

## 2018-10-11 LAB — HEMOGLOBIN AND HEMATOCRIT, BLOOD
HCT: 29.9 % — ABNORMAL LOW (ref 36.0–46.0)
Hemoglobin: 9.3 g/dL — ABNORMAL LOW (ref 12.0–15.0)

## 2018-10-11 MED ORDER — EPOETIN ALFA-EPBX 10000 UNIT/ML IJ SOLN
10000.0000 [IU] | Freq: Once | INTRAMUSCULAR | Status: AC
  Administered 2018-10-11: 10000 [IU] via SUBCUTANEOUS
  Filled 2018-10-11: qty 1

## 2018-10-21 IMAGING — CT CT CHEST W/O CM
2 of 5 series · 15 of 36 positions shown, 18 images · non-contrast
Comparison: Chest radiograph 01/12/2010

CLINICAL DATA: Short of breath for 1 month

EXAM:
CT CHEST WITHOUT CONTRAST
TECHNIQUE: Multidetector CT imaging of the chest was performed following the
standard protocol without IV contrast.

[Series 2: soft tissue · axial · 0.63mm/px · z∈[-592,-368]mm · 12 of 129 slices shown, 15 images]
[im 11/129  mediastinal]
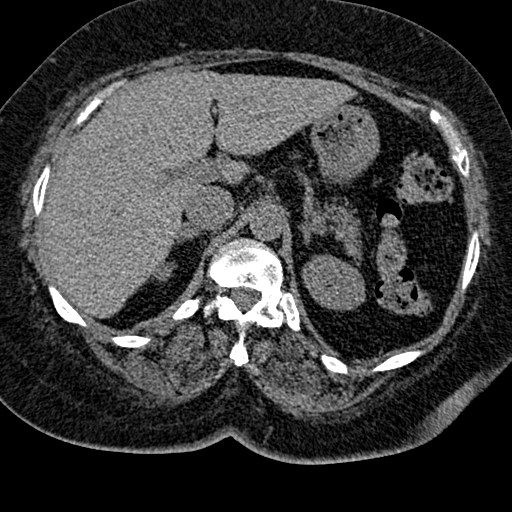
[im 11/129  lung]
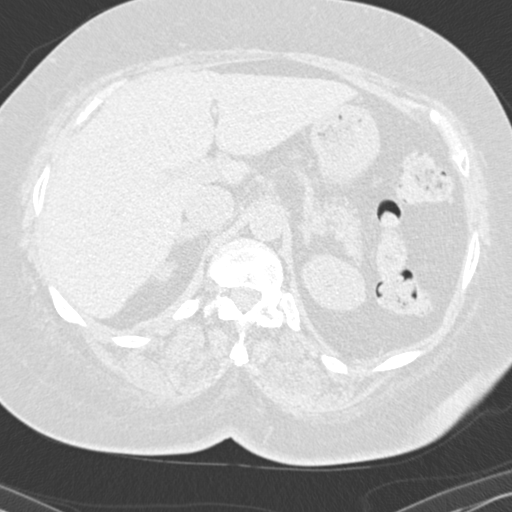
[im 21/129  lung]
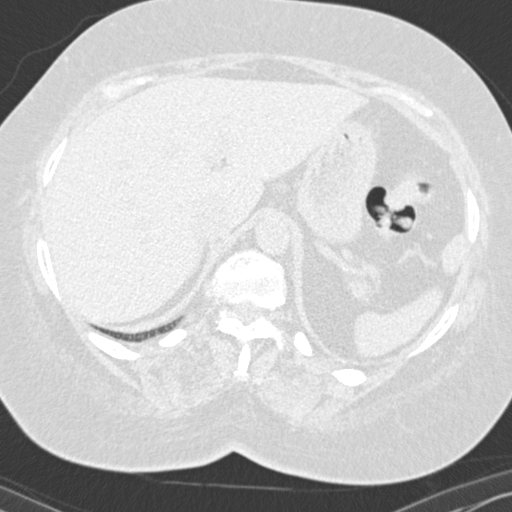
[im 31/129  lung]
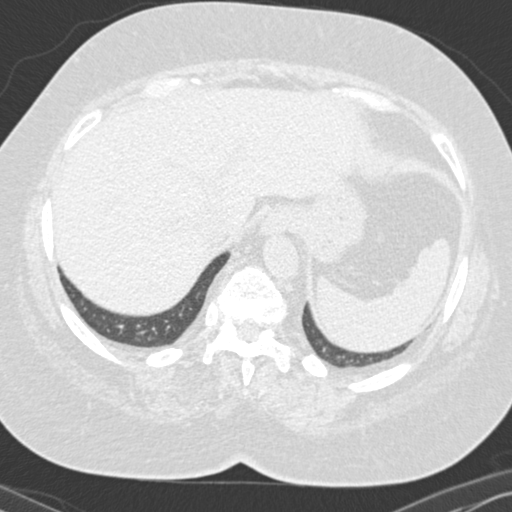
[im 41/129  lung]
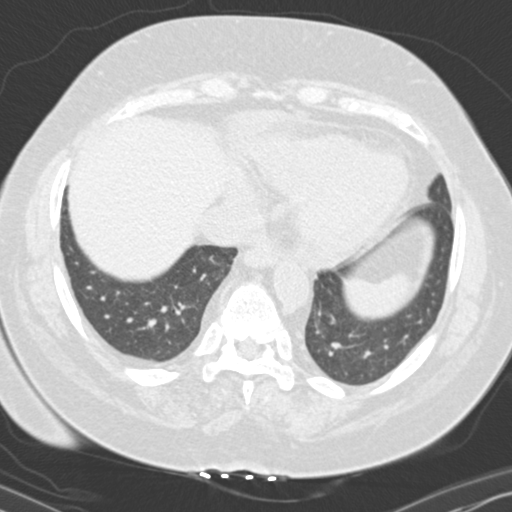
[im 52/129  mediastinal]
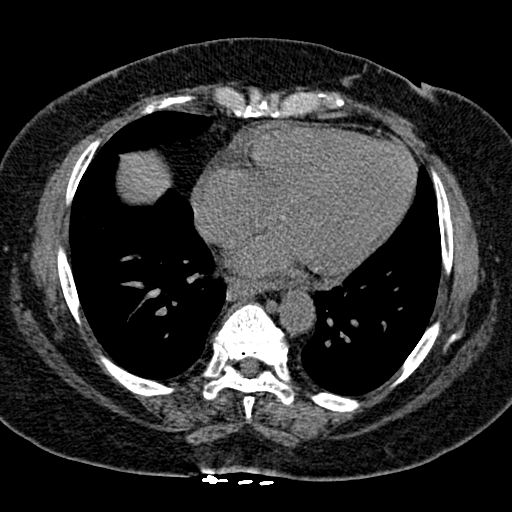
[im 52/129  lung]
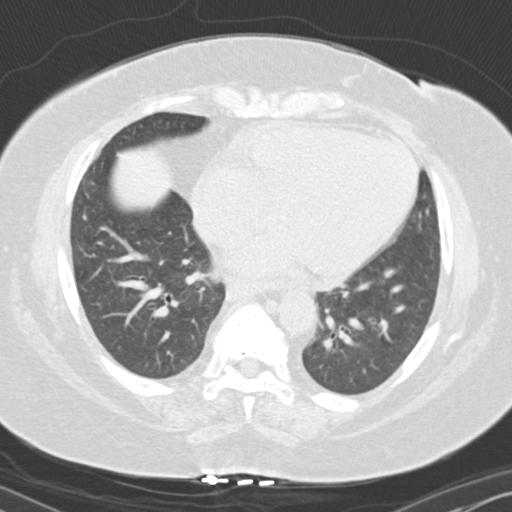
[im 62/129  lung]
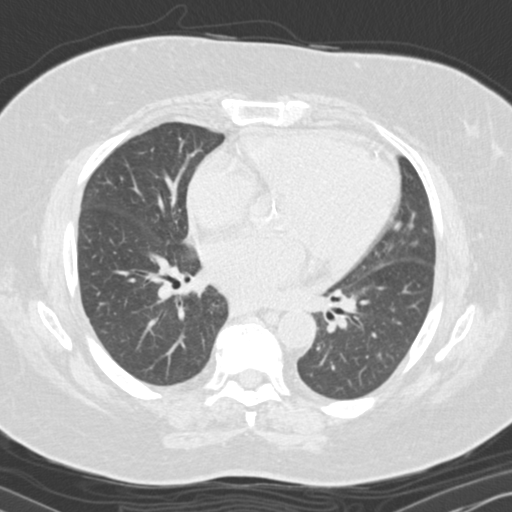
[im 72/129  lung]
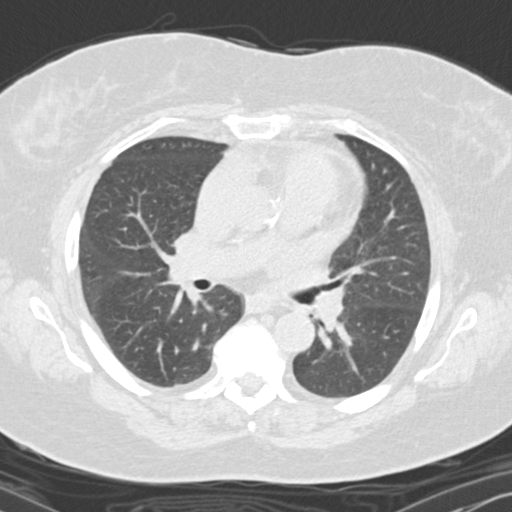
[im 82/129  lung]
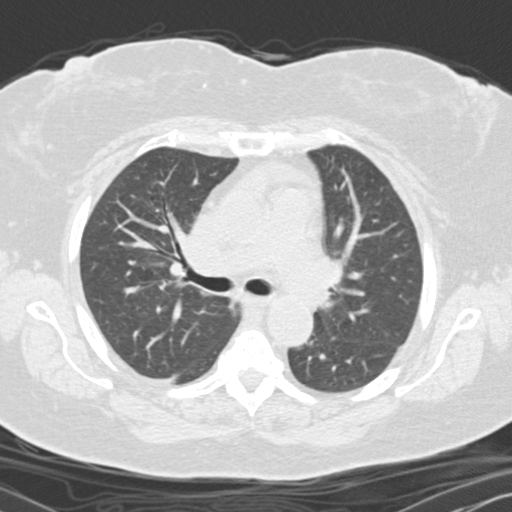
[im 93/129  mediastinal]
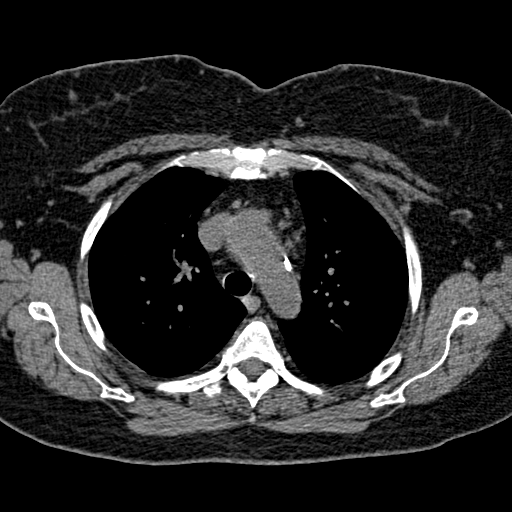
[im 93/129  lung]
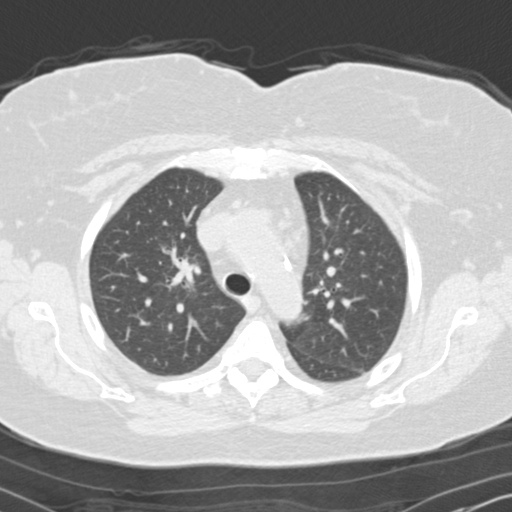
[im 103/129  lung]
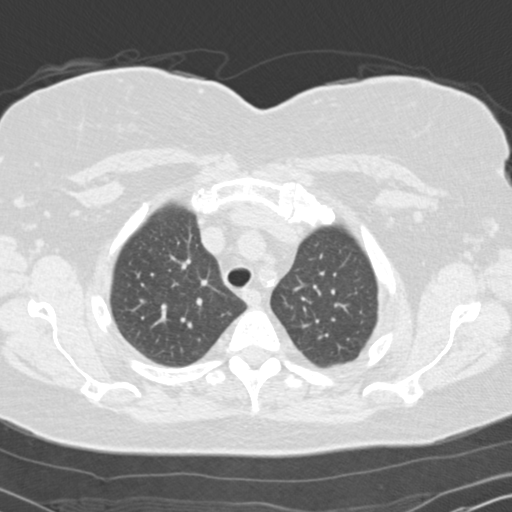
[im 113/129  lung]
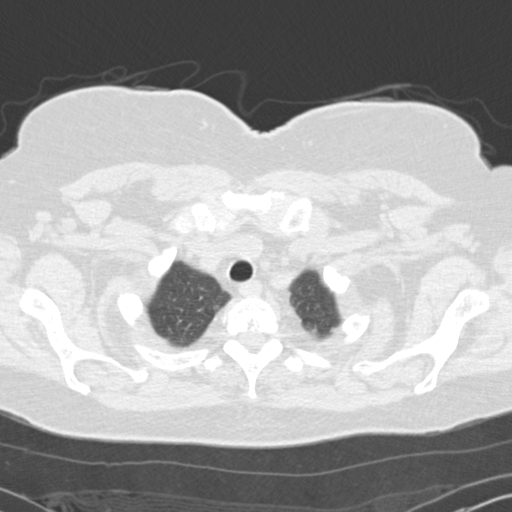
[im 123/129  lung]
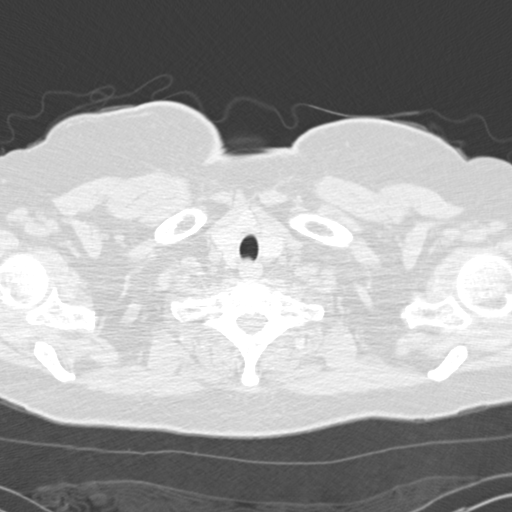

[Series 602: coronal · coronal · 0.63mm/px · 3 of 159 slices shown]
[im 32/159  lung]
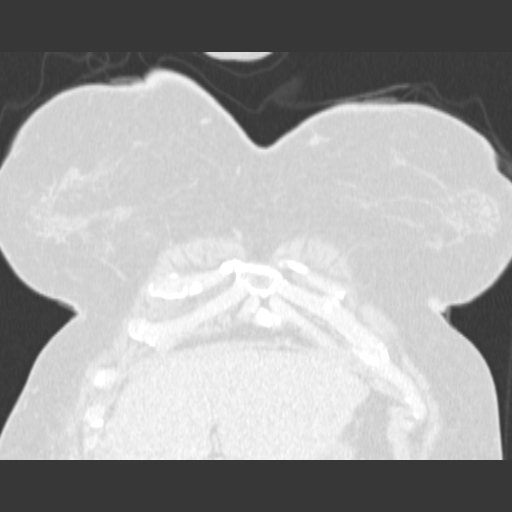
[im 64/159  lung]
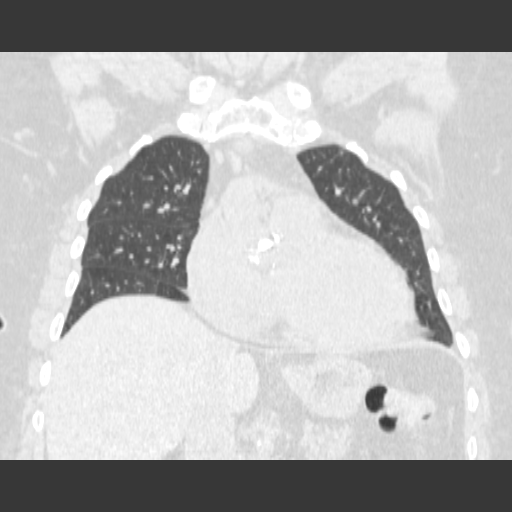
[im 95/159  lung]
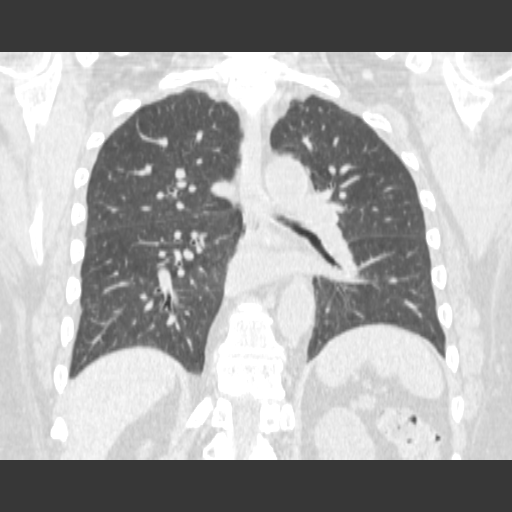

[15 of 36 positions shown; findings below may reference images not displayed]

FINDINGS: Cardiovascular: Coronary artery calcification and aortic
atherosclerotic calcification.

Mediastinum/Nodes: No axillary or supraclavicular adenopathy. No
mediastinal hilar adenopathy. No pericardial fluid.

Lungs/Pleura: No ground-glass opacities. No architectural
distortion. No bronchiectasis. No nodularity. Inspiratory and
expiratory imaging demonstrates no air trapping.

Solitary smudgy nodule in the RIGHT lower lobe measures 3 mm (image
71, series 3).

Upper Abdomen: Limited view of the liver, kidneys, pancreas are
unremarkable. Normal adrenal glands.

Musculoskeletal: No aggressive osseous lesion
IMPRESSION: 1. Trace pericardial effusion.
2. No evidence of interstitial lung disease.
3. Small RIGHT lower lobe nodule. No follow-up needed if patient is
low-risk. Non-contrast chest CT can be considered in 12 months if
patient is high-risk. This recommendation follows the consensus
statement: Guidelines for Management of Incidental Pulmonary Nodules
Detected on CT Images: From the [HOSPITAL] 4006; Radiology
4.  Aortic Atherosclerosis ([id number]-CUJ.J).

## 2018-10-24 ENCOUNTER — Other Ambulatory Visit: Payer: Self-pay

## 2018-10-25 ENCOUNTER — Inpatient Hospital Stay: Payer: Medicare HMO

## 2018-10-25 VITALS — BP 136/56 | HR 60 | Temp 98.1°F | Resp 20

## 2018-10-25 DIAGNOSIS — N183 Chronic kidney disease, stage 3 unspecified: Secondary | ICD-10-CM

## 2018-10-25 DIAGNOSIS — D631 Anemia in chronic kidney disease: Secondary | ICD-10-CM

## 2018-10-25 LAB — HEMOGLOBIN AND HEMATOCRIT, BLOOD
HCT: 29.7 % — ABNORMAL LOW (ref 36.0–46.0)
Hemoglobin: 9.5 g/dL — ABNORMAL LOW (ref 12.0–15.0)

## 2018-10-25 MED ORDER — EPOETIN ALFA-EPBX 10000 UNIT/ML IJ SOLN
10000.0000 [IU] | Freq: Once | INTRAMUSCULAR | Status: AC
  Administered 2018-10-25: 10000 [IU] via SUBCUTANEOUS

## 2018-11-06 NOTE — Progress Notes (Signed)
[facility]  [address], [address] [place], [place] [address] Phone: [phone]  Fax: [phone]   Clinic Day:  11/08/2018  Referring physician: Sallee Lange, *  Chief Complaint: Sara Bright is a 78 y.o. female with anemia of chronic kidney disease who is seen for 2 month assessment.   HPI: The patient was last seen in the hematology clinic on 08/30/2018. At that time, she had issues with fluid in her leg. She was using a CPAP at night. She was having 3 soft bowel movements/day. She denied any bleeding. Hematocrit 29.3, hemoglobin 9.1, MCV 106.5. Ferritin 217. Creatinine was 1.88. She received Retacrit.   She received Retacrit on 09/13/2018, 09/27/2018,  10/11/2018, and 10/25/2018.   Labs followed: 09/13/2018: Hematocrit 29.8. Hemoglobin 9.3. 09/27/2018: Hematocrit 29.1. Hemoglobin 9.1. 10/11/2018: Hematocrit 29.9. Hemoglobin 9.3. 10/25/2018: Hematocrit 29.7. Hemoglobin 9.5.  During the interim, she has felt "good".  She denies any chest pain, and dizziness. She notes some fatigue.  Her shortness of breath has improved. She has good bowel movements. She denies any blood in her stool.  Her diabetes is being well managed.  Her BP is 155/65 in the clinic today.     Past Medical History:  Diagnosis Date  . Adenoma of colon   . Adenomatous colon polyp   . Anemia    IDA  . Cataract   . Cervical stenosis of spinal canal   . Chronic kidney disease    stage 3  . Diabetes mellitus without complication ([place])   . GERD (gastroesophageal reflux disease)   . Glaucoma   . Heme positive stool   . History of UTI   . Hx of gallstones   . Hyperkalemia   . Hyperlipidemia   . Hyperlipidemia   . Hypertension   . Lumbar stenosis   . Lymphedema   . Neuromuscular disorder ([place])   . Neuropathy associated with endocrine disorder ([place])   . Obesity   . OSA on CPAP    on CPAP  . Osteoarthritis   . Osteoarthritis   . Port-A-Cath in place   .  Retinopathy due to secondary diabetes ([place])   . Secondary hyperparathyroidism of renal origin [facility])     Past Surgical History:  Procedure Laterality Date  . ACDF X2    . BACK SURGERY    . Cataract extraction right    . catarect extraction left    . CHOLECYSTECTOMY    . COLONOSCOPY WITH PROPOFOL N/A 12/02/2016   Procedure: COLONOSCOPY WITH PROPOFOL;  Surgeon: Manya Silvas, MD;  Location: [facility] ENDOSCOPY;  Service: Endoscopy;  Laterality: N/A;  . COLONOSCOPY WITH PROPOFOL N/A 11/10/2017   Procedure: COLONOSCOPY WITH PROPOFOL;  Surgeon: Toledo, Benay Pike, MD;  Location: [facility] ENDOSCOPY;  Service: Gastroenterology;  Laterality: N/A;  . colonoscopy with removal lesions by snare    . Endoscoic carpal tunnel release    . ESOPHAGOGASTRODUODENOSCOPY N/A 11/10/2017   Procedure: ESOPHAGOGASTRODUODENOSCOPY (EGD);  Surgeon: Toledo, Benay Pike, MD;  Location: [facility] ENDOSCOPY;  Service: Gastroenterology;  Laterality: N/A;  . ESOPHAGOGASTRODUODENOSCOPY (EGD) WITH PROPOFOL N/A 12/02/2016   Procedure: ESOPHAGOGASTRODUODENOSCOPY (EGD) WITH PROPOFOL;  Surgeon: Manya Silvas, MD;  Location: [facility] ENDOSCOPY;  Service: Endoscopy;  Laterality: N/A;  . IR IMAGING GUIDED PORT INSERTION  10/01/2017  . Laminectomy posterior lumbar facetectomy and formaninotomy w/Decomp    . Laminectony posterior cervicle decomp w/Facectomy and foraminotomy    . VEIN LIGATION AND STRIPPING Left     Family History  Problem Relation  Age of Onset  . Coronary artery disease Mother   . Diabetes type II Mother   . Hypertension Mother   . Tuberculosis Father   . Stroke Father   . Diabetes type II Sister   . Migraines Sister   . Alcohol abuse Brother   . Coronary artery disease Brother   . Diabetes type II Brother   . Kidney cancer Brother   . Kidney disease Brother   . Heart attack Brother   . Hypertension Brother     Social History:  reports that she has never smoked. She has never used smokeless tobacco. She reports that she  does not drink alcohol or use drugs. She lives in [place]. The patient is alone today.  Allergies:  Allergies  Allergen Reactions  . Hydrocodone-Acetaminophen Other (See Comments)    Sedation and GI upset, pt is not allergic to acetaminophen  . Statins Other (See Comments)  . Tramadol-Acetaminophen Nausea And Vomiting    GI upset, pt is not allergic to acetaminophen    Current Medications: Current Outpatient Medications  Medication Sig Dispense Refill  . amLODipine (NORVASC) 5 MG tablet Take 10 mg by mouth daily.     Marland Kitchen aspirin EC 81 MG tablet Take 81 mg by mouth daily.    . calcitRIOL (ROCALTROL) 0.25 MCG capsule Take 0.5 mcg by mouth 3 (three) times a week.     . carvedilol (COREG) 6.25 MG tablet Take 6.25 mg by mouth 2 (two) times daily.  12  . ferrous sulfate 325 (65 FE) MG tablet Take 325 mg by mouth daily.    . furosemide (LASIX) 40 MG tablet Take 40 mg by mouth daily.     Marland Kitchen gabapentin (NEURONTIN) 100 MG capsule Take 100 mg by mouth 2 (two) times daily.     Marland Kitchen gemfibrozil (LOPID) 600 MG tablet Take 600 mg by mouth daily.     Marland Kitchen glimepiride (AMARYL) 1 MG tablet Take 0.5 mg by mouth daily with breakfast.     . hydrALAZINE (APRESOLINE) 50 MG tablet Take 25 mg by mouth 3 (three) times daily.   12  . latanoprost (XALATAN) 0.005 % ophthalmic solution Place 1 drop into both eyes.     Marland Kitchen losartan (COZAAR) 50 MG tablet Take 100 mg by mouth daily.     . Multiple Vitamins-Minerals (CENTRUM SILVER) tablet Take 1 tablet by mouth daily.     . Omega-3 Fatty Acids (FISH OIL) 1000 MG CAPS Take 1,000 mg by mouth daily.     Marland Kitchen omeprazole (PRILOSEC) 20 MG capsule Take 20 mg by mouth daily.     . pioglitazone (ACTOS) 30 MG tablet Take 30 mg by mouth daily.     Marland Kitchen acetaminophen (TYLENOL) 500 MG tablet Take 500 mg by mouth every 6 (six) hours as needed.     Marland Kitchen albuterol (PROVENTIL HFA;VENTOLIN HFA) 108 (90 Base) MCG/ACT inhaler Inhale 2 puffs into the lungs every 6 (six) hours as needed for wheezing or  shortness of breath.    . ASSURE COMFORT LANCETS 30G MISC     . Lancet Devices (ADJUSTABLE LANCING DEVICE) MISC     . lidocaine-prilocaine (EMLA) cream Apply 1 application topically as needed. (Patient not taking: Reported on 11/08/2018) 30 g 3  . ONE TOUCH ULTRA TEST test strip      No current facility-administered medications for this visit.     Review of Systems  Constitutional: Positive for malaise/fatigue (some). Negative for chills, diaphoresis, fever and weight loss.  Feels "good".  HENT: Negative.  Negative for congestion, ear pain, hearing loss, sinus pain and sore throat.   Eyes: Negative.  Negative for blurred vision and double vision.  Respiratory: Positive for shortness of breath (on exertion, improved) and wheezing (improved, uses inhaler). Negative for cough, hemoptysis and sputum production.   Cardiovascular: Negative.  Negative for chest pain, palpitations and leg swelling.  Gastrointestinal: Negative.  Negative for abdominal pain, blood in stool, constipation, diarrhea (at times), heartburn, melena, nausea and vomiting.       Normal bowel movements.  Genitourinary: Negative.  Negative for dysuria, frequency and urgency.  Musculoskeletal: Negative.  Negative for back pain, joint pain and myalgias.  Skin: Negative.   Neurological: Negative.  Negative for dizziness, tingling, sensory change, speech change, focal weakness and weakness.  Endo/Heme/Allergies: Does not bruise/bleed easily.       Diabetes.  No thyroid issues.  Psychiatric/Behavioral: Negative.  Negative for depression and memory loss. The patient is not nervous/anxious and does not have insomnia.   All other systems reviewed and are negative.  Performance status (ECOG):  1  Vitals Blood pressure (!) 155/65, pulse 72, temperature (!) 96.8 F (36 C), temperature source Tympanic, resp. rate 18, height 5\' 4"  (1.626 m), weight 262 lb 3.8 oz (118.9 kg), SpO2 100 %.  Physical Exam  Constitutional: She is  oriented to person, place, and time. She appears well-developed and well-nourished. No distress.  She has a cane by her side.  HENT:  Head: Normocephalic and atraumatic.  Mouth/Throat: Oropharynx is clear and moist. No oropharyngeal exudate.  Short dark wig.  Eyes: Pupils are equal, round, and reactive to light. Conjunctivae and EOM are normal. No scleral icterus.  Glasses.  Brown eyes.  Neck: Normal range of motion. Neck supple. No JVD present.  Cardiovascular: Normal rate, regular rhythm and normal heart sounds.  No murmur heard. Pulmonary/Chest: Effort normal and breath sounds normal. No respiratory distress. She has no wheezes. She has no rales.  Abdominal: Soft. Bowel sounds are normal. She exhibits no distension and no mass. There is no abdominal tenderness. There is no rebound and no guarding.  Musculoskeletal: Normal range of motion.        General: No edema.  Lymphadenopathy:    She has no cervical adenopathy.  Neurological: She is alert and oriented to person, place, and time. She has normal reflexes.  Skin: Skin is warm and dry. No rash noted. She is not diaphoretic. No erythema. No pallor.  Psychiatric: She has a normal mood and affect. Her behavior is normal. Judgment and thought content normal.  Nursing note and vitals reviewed.   Appointment on 11/08/2018  Component Date Value Ref Range Status  . Sodium 11/08/2018 139  135 - 145 mmol/L Final  . Potassium 11/08/2018 4.4  3.5 - 5.1 mmol/L Final  . Chloride 11/08/2018 106  98 - 111 mmol/L Final  . CO2 11/08/2018 22  22 - 32 mmol/L Final  . Glucose, Bld 11/08/2018 122* 70 - 99 mg/dL Final  . BUN 11/08/2018 55* 8 - 23 mg/dL Final  . Creatinine, Ser 11/08/2018 1.80* 0.44 - 1.00 mg/dL Final  . Calcium 11/08/2018 9.7  8.9 - 10.3 mg/dL Final  . GFR calc non Af Amer 11/08/2018 26* >60 mL/min Final  . GFR calc Af Amer 11/08/2018 30* >60 mL/min Final  . Anion gap 11/08/2018 11  5 - 15 Final   Performed at [facility], [address], [place], [place] [address]  .  WBC 11/08/2018 3.8* 4.0 - 10.5 K/uL Final  . RBC 11/08/2018 2.82* 3.87 - 5.11 MIL/uL Final  . Hemoglobin 11/08/2018 9.2* 12.0 - 15.0 g/dL Final  . HCT 11/08/2018 29.2* 36.0 - 46.0 % Final  . MCV 11/08/2018 103.5* 80.0 - 100.0 fL Final  . MCH 11/08/2018 32.6  26.0 - 34.0 pg Final  . MCHC 11/08/2018 31.5  30.0 - 36.0 g/dL Final  . RDW 11/08/2018 14.5  11.5 - 15.5 % Final  . Platelets 11/08/2018 137* 150 - 400 K/uL Final  . nRBC 11/08/2018 0.0  0.0 - 0.2 % Final  . Neutrophils Relative % 11/08/2018 51  % Final  . Neutro Abs 11/08/2018 1.9  1.7 - 7.7 K/uL Final  . Lymphocytes Relative 11/08/2018 29  % Final  . Lymphs Abs 11/08/2018 1.1  0.7 - 4.0 K/uL Final  . Monocytes Relative 11/08/2018 14  % Final  . Monocytes Absolute 11/08/2018 0.5  0.1 - 1.0 K/uL Final  . Eosinophils Relative 11/08/2018 5  % Final  . Eosinophils Absolute 11/08/2018 0.2  0.0 - 0.5 K/uL Final  . Basophils Relative 11/08/2018 1  % Final  . Basophils Absolute 11/08/2018 0.1  0.0 - 0.1 K/uL Final  . Immature Granulocytes 11/08/2018 0  % Final  . Abs Immature Granulocytes 11/08/2018 0.01  0.00 - 0.07 K/uL Final   Performed at [facility], [address] [place], [place] [address]    Assessment:  Sara Bright is a 78 y.o. female with anemia of chronic kidney disease.She has stage III chronic kidney disease. SPEP and free light chain ratio was normal on 11/12/2015.  She receivedAranesp 200 mcg on 09/02/2016, 12/22/2016, 03/23/2017, 07/20/2017, 08/17/2017, and 09/15/2017. She received Aranesp 300 mcgon 10/06/2017, 10/19/2017, 01/04/2018, 03/08/2018,and03/05/2018.She received Retacrit10,000 units on 07/04/2018.  She received 1 unit of PRBCson 09/22/2017. She received Venoferx 4 (10/22/2015 - 11/05/2015), x4 (01/21/2016 - 02/11/2016), 11/24/2016, 06/22/2017, 09/14/2017, 10/01/2017, 10/12/2017, 10/26/2017, 11/16/2017, and 03/01/2018.   Ferritinhas been followed: 63 on 10/08/2015, 290 on 01/14/2016, 651 on 05/12/2016, 467 on 09/01/2016, 413 on 11/19/2016, 633 on 12/22/2016, 374 on 01/19/2017, 480 on 06/22/2017, 135 on 09/22/2017, 159 on 10/06/2017, 373 on 03/01/2018, 289 on 03/29/2018, 257 on 07/04/2018, 217 on 08/30/2018, and 178 on 11/08/2018.   EGDon 09/04/2019revealed an irregularZ-line irregular, at the gastroesophageal junction, normal stomach, and normal duodenum.Colonoscopyon 11/10/2017 revealed diverticulosis in the sigmoid colonand non-bleeding internal hemorrhoids.  She has macrocytic RBC indices. B12, folate, and TSH were normal on 04/26/2018.  Symptomatically, she is doing well.  She denies any bleeding.  Diet is good.  Exam is stable.  Plan: 1.   Labs today: CBC with diff, BMP, ferritin. 2.Anemia of chronic renal disease Hematocrit 29.2. Hemoglobin 9.2. Ferritin 178 today.  Maintain ferritin > 100.             Retacrit today. 3.Macrocytosis MCV 103.5. She has had macrocytic RBC indices since 10/01/2017. She has no known liver disease. B12 was 771, folate 29, and TSH 4.290 on 04/26/2018.  Continue to monitor. 4.Renal insufficiency            Creatinine 1.80 (CrCl 32.2).             Patient is followed by Dr. Holley Raring. 5.   RTC every 2 weeks x 6 for hematocrit/hemoglobin and +/-Retacrit. 6.   RTC in 12 weeks for MD assessment, labs (CBC with diff, BMP, ferritin) and +/-Retacrit.  I discussed the assessment and treatment plan with the patient.  The patient was  provided an opportunity to ask questions and all were answered.  The patient agreed with the plan and demonstrated an understanding of the instructions.  The patient was advised to call back if the symptoms worsen or if the condition fails to improve as anticipated.   Lequita Asal, MD, PhD    11/08/2018, 10:48 AM  I, Selena Batten, am acting  as scribe for [employer]. Mike Gip, MD, PhD.  I, Melissa C. Mike Gip, MD, have reviewed the above documentation for accuracy and completeness, and I agree with the above.

## 2018-11-08 ENCOUNTER — Inpatient Hospital Stay: Payer: Medicare HMO

## 2018-11-08 ENCOUNTER — Other Ambulatory Visit: Payer: Self-pay

## 2018-11-08 ENCOUNTER — Inpatient Hospital Stay: Payer: Medicare HMO | Attending: Hematology and Oncology | Admitting: Hematology and Oncology

## 2018-11-08 ENCOUNTER — Encounter: Payer: Self-pay | Admitting: Hematology and Oncology

## 2018-11-08 VITALS — BP 155/56

## 2018-11-08 VITALS — BP 155/65 | HR 72 | Temp 96.8°F | Resp 18 | Ht 64.0 in | Wt 262.2 lb

## 2018-11-08 DIAGNOSIS — Z841 Family history of disorders of kidney and ureter: Secondary | ICD-10-CM | POA: Insufficient documentation

## 2018-11-08 DIAGNOSIS — R0602 Shortness of breath: Secondary | ICD-10-CM | POA: Insufficient documentation

## 2018-11-08 DIAGNOSIS — D631 Anemia in chronic kidney disease: Secondary | ICD-10-CM

## 2018-11-08 DIAGNOSIS — E1122 Type 2 diabetes mellitus with diabetic chronic kidney disease: Secondary | ICD-10-CM | POA: Diagnosis not present

## 2018-11-08 DIAGNOSIS — Z8051 Family history of malignant neoplasm of kidney: Secondary | ICD-10-CM | POA: Insufficient documentation

## 2018-11-08 DIAGNOSIS — N183 Chronic kidney disease, stage 3 unspecified: Secondary | ICD-10-CM

## 2018-11-08 DIAGNOSIS — D7589 Other specified diseases of blood and blood-forming organs: Secondary | ICD-10-CM | POA: Diagnosis not present

## 2018-11-08 DIAGNOSIS — Z833 Family history of diabetes mellitus: Secondary | ICD-10-CM | POA: Insufficient documentation

## 2018-11-08 DIAGNOSIS — Z8719 Personal history of other diseases of the digestive system: Secondary | ICD-10-CM | POA: Diagnosis not present

## 2018-11-08 DIAGNOSIS — I129 Hypertensive chronic kidney disease with stage 1 through stage 4 chronic kidney disease, or unspecified chronic kidney disease: Secondary | ICD-10-CM | POA: Diagnosis not present

## 2018-11-08 DIAGNOSIS — E1136 Type 2 diabetes mellitus with diabetic cataract: Secondary | ICD-10-CM | POA: Diagnosis not present

## 2018-11-08 DIAGNOSIS — M1990 Unspecified osteoarthritis, unspecified site: Secondary | ICD-10-CM | POA: Insufficient documentation

## 2018-11-08 DIAGNOSIS — Z885 Allergy status to narcotic agent status: Secondary | ICD-10-CM | POA: Diagnosis not present

## 2018-11-08 DIAGNOSIS — Z79899 Other long term (current) drug therapy: Secondary | ICD-10-CM | POA: Insufficient documentation

## 2018-11-08 DIAGNOSIS — R5383 Other fatigue: Secondary | ICD-10-CM | POA: Diagnosis not present

## 2018-11-08 DIAGNOSIS — Z888 Allergy status to other drugs, medicaments and biological substances status: Secondary | ICD-10-CM | POA: Diagnosis not present

## 2018-11-08 DIAGNOSIS — Z823 Family history of stroke: Secondary | ICD-10-CM | POA: Diagnosis not present

## 2018-11-08 DIAGNOSIS — Z811 Family history of alcohol abuse and dependence: Secondary | ICD-10-CM | POA: Insufficient documentation

## 2018-11-08 DIAGNOSIS — Z8249 Family history of ischemic heart disease and other diseases of the circulatory system: Secondary | ICD-10-CM | POA: Insufficient documentation

## 2018-11-08 DIAGNOSIS — Z86010 Personal history of colonic polyps: Secondary | ICD-10-CM | POA: Insufficient documentation

## 2018-11-08 DIAGNOSIS — Z825 Family history of asthma and other chronic lower respiratory diseases: Secondary | ICD-10-CM | POA: Diagnosis not present

## 2018-11-08 LAB — CBC WITH DIFFERENTIAL/PLATELET
Abs Immature Granulocytes: 0.01 10*3/uL (ref 0.00–0.07)
Basophils Absolute: 0.1 10*3/uL (ref 0.0–0.1)
Basophils Relative: 1 %
Eosinophils Absolute: 0.2 10*3/uL (ref 0.0–0.5)
Eosinophils Relative: 5 %
HCT: 29.2 % — ABNORMAL LOW (ref 36.0–46.0)
Hemoglobin: 9.2 g/dL — ABNORMAL LOW (ref 12.0–15.0)
Immature Granulocytes: 0 %
Lymphocytes Relative: 29 %
Lymphs Abs: 1.1 10*3/uL (ref 0.7–4.0)
MCH: 32.6 pg (ref 26.0–34.0)
MCHC: 31.5 g/dL (ref 30.0–36.0)
MCV: 103.5 fL — ABNORMAL HIGH (ref 80.0–100.0)
Monocytes Absolute: 0.5 10*3/uL (ref 0.1–1.0)
Monocytes Relative: 14 %
Neutro Abs: 1.9 10*3/uL (ref 1.7–7.7)
Neutrophils Relative %: 51 %
Platelets: 137 10*3/uL — ABNORMAL LOW (ref 150–400)
RBC: 2.82 MIL/uL — ABNORMAL LOW (ref 3.87–5.11)
RDW: 14.5 % (ref 11.5–15.5)
WBC: 3.8 10*3/uL — ABNORMAL LOW (ref 4.0–10.5)
nRBC: 0 % (ref 0.0–0.2)

## 2018-11-08 LAB — BASIC METABOLIC PANEL
Anion gap: 11 (ref 5–15)
BUN: 55 mg/dL — ABNORMAL HIGH (ref 8–23)
CO2: 22 mmol/L (ref 22–32)
Calcium: 9.7 mg/dL (ref 8.9–10.3)
Chloride: 106 mmol/L (ref 98–111)
Creatinine, Ser: 1.8 mg/dL — ABNORMAL HIGH (ref 0.44–1.00)
GFR calc Af Amer: 30 mL/min — ABNORMAL LOW (ref >60)
GFR calc non Af Amer: 26 mL/min — ABNORMAL LOW (ref >60)
Glucose, Bld: 122 mg/dL — ABNORMAL HIGH (ref 70–99)
Potassium: 4.4 mmol/L (ref 3.5–5.1)
Sodium: 139 mmol/L (ref 135–145)

## 2018-11-08 LAB — FERRITIN: Ferritin: 178 ng/mL (ref 11–307)

## 2018-11-08 MED ORDER — EPOETIN ALFA-EPBX 10000 UNIT/ML IJ SOLN
10000.0000 [IU] | Freq: Once | INTRAMUSCULAR | Status: AC
  Administered 2018-11-08: 11:00:00 10000 [IU] via SUBCUTANEOUS
  Filled 2018-11-08: qty 1

## 2018-11-08 NOTE — Progress Notes (Signed)
No new changes noted today 

## 2018-11-22 ENCOUNTER — Other Ambulatory Visit: Payer: Self-pay

## 2018-11-22 ENCOUNTER — Inpatient Hospital Stay: Payer: Medicare HMO

## 2018-11-22 VITALS — BP 147/67 | HR 72 | Resp 18

## 2018-11-22 DIAGNOSIS — N183 Chronic kidney disease, stage 3 unspecified: Secondary | ICD-10-CM

## 2018-11-22 DIAGNOSIS — D631 Anemia in chronic kidney disease: Secondary | ICD-10-CM

## 2018-11-22 DIAGNOSIS — I129 Hypertensive chronic kidney disease with stage 1 through stage 4 chronic kidney disease, or unspecified chronic kidney disease: Secondary | ICD-10-CM | POA: Diagnosis not present

## 2018-11-22 LAB — HEMOGLOBIN AND HEMATOCRIT, BLOOD
HCT: 30.4 % — ABNORMAL LOW (ref 36.0–46.0)
Hemoglobin: 9.5 g/dL — ABNORMAL LOW (ref 12.0–15.0)

## 2018-11-22 MED ORDER — EPOETIN ALFA-EPBX 10000 UNIT/ML IJ SOLN
10000.0000 [IU] | Freq: Once | INTRAMUSCULAR | Status: AC
  Administered 2018-11-22: 10000 [IU] via SUBCUTANEOUS

## 2018-11-22 NOTE — Patient Instructions (Signed)

## 2018-12-06 ENCOUNTER — Inpatient Hospital Stay: Payer: Medicare HMO

## 2018-12-06 ENCOUNTER — Other Ambulatory Visit: Payer: Self-pay

## 2018-12-06 VITALS — BP 130/55 | HR 69 | Resp 18

## 2018-12-06 DIAGNOSIS — I129 Hypertensive chronic kidney disease with stage 1 through stage 4 chronic kidney disease, or unspecified chronic kidney disease: Secondary | ICD-10-CM | POA: Diagnosis not present

## 2018-12-06 DIAGNOSIS — D631 Anemia in chronic kidney disease: Secondary | ICD-10-CM

## 2018-12-06 DIAGNOSIS — N183 Chronic kidney disease, stage 3 unspecified: Secondary | ICD-10-CM

## 2018-12-06 LAB — HEMOGLOBIN AND HEMATOCRIT, BLOOD
HCT: 29.8 % — ABNORMAL LOW (ref 36.0–46.0)
Hemoglobin: 9.4 g/dL — ABNORMAL LOW (ref 12.0–15.0)

## 2018-12-06 MED ORDER — EPOETIN ALFA-EPBX 10000 UNIT/ML IJ SOLN
10000.0000 [IU] | Freq: Once | INTRAMUSCULAR | Status: AC
  Administered 2018-12-06: 10000 [IU] via SUBCUTANEOUS

## 2018-12-16 ENCOUNTER — Ambulatory Visit (INDEPENDENT_AMBULATORY_CARE_PROVIDER_SITE_OTHER): Payer: Medicare HMO | Admitting: Vascular Surgery

## 2018-12-16 ENCOUNTER — Other Ambulatory Visit: Payer: Self-pay

## 2018-12-16 ENCOUNTER — Encounter (INDEPENDENT_AMBULATORY_CARE_PROVIDER_SITE_OTHER): Payer: Self-pay | Admitting: Vascular Surgery

## 2018-12-16 VITALS — BP 152/65 | HR 80 | Resp 16 | Ht 64.0 in | Wt 267.0 lb

## 2018-12-16 DIAGNOSIS — L97929 Non-pressure chronic ulcer of unspecified part of left lower leg with unspecified severity: Secondary | ICD-10-CM

## 2018-12-16 DIAGNOSIS — I890 Lymphedema, not elsewhere classified: Secondary | ICD-10-CM | POA: Diagnosis not present

## 2018-12-16 DIAGNOSIS — N183 Chronic kidney disease, stage 3 unspecified: Secondary | ICD-10-CM

## 2018-12-16 DIAGNOSIS — I10 Essential (primary) hypertension: Secondary | ICD-10-CM | POA: Diagnosis not present

## 2018-12-16 DIAGNOSIS — I83029 Varicose veins of left lower extremity with ulcer of unspecified site: Secondary | ICD-10-CM

## 2018-12-16 NOTE — Progress Notes (Signed)
MRN : [id number]  Sara Bright is a 78 y.o. (11-Aug-1940) female who presents with chief complaint of  Chief Complaint  Patient presents with  . Follow-up    3 month f/u  .  History of Present Illness: Patient returns today in follow up of her left leg swelling.  She has had recurrent weeping of the skin on the medial aspect of the left leg and now has an ulceration just above the medial left ankle.  This is a recurrent ulceration after he had previous heel healed with Unna boots earlier this year.  Her leg swelling is prominent in both legs.  She clearly has dermal thickening and weeping of the tissue and now would have at least stage II if not stage III lymphedema.  The legs are heavy and tired.  No fever or chills.  Current Outpatient Medications  Medication Sig Dispense Refill  . acetaminophen (TYLENOL) 500 MG tablet Take 500 mg by mouth every 6 (six) hours as needed.     Marland Kitchen albuterol (PROVENTIL HFA;VENTOLIN HFA) 108 (90 Base) MCG/ACT inhaler Inhale 2 puffs into the lungs every 6 (six) hours as needed for wheezing or shortness of breath.    Marland Kitchen amLODipine (NORVASC) 5 MG tablet Take 10 mg by mouth daily.     Marland Kitchen aspirin EC 81 MG tablet Take 81 mg by mouth daily.    . ASSURE COMFORT LANCETS 30G MISC     . carvedilol (COREG) 6.25 MG tablet Take 6.25 mg by mouth 2 (two) times daily.  12  . ferrous sulfate 325 (65 FE) MG tablet Take 325 mg by mouth daily.    . furosemide (LASIX) 40 MG tablet Take 40 mg by mouth daily.     Marland Kitchen gabapentin (NEURONTIN) 100 MG capsule Take 100 mg by mouth 2 (two) times daily.     Marland Kitchen gemfibrozil (LOPID) 600 MG tablet Take 600 mg by mouth daily.     . hydrALAZINE (APRESOLINE) 50 MG tablet Take 25 mg by mouth 3 (three) times daily.   12  . Lancet Devices (ADJUSTABLE LANCING DEVICE) MISC     . latanoprost (XALATAN) 0.005 % ophthalmic solution Place 1 drop into both eyes.     Marland Kitchen lidocaine-prilocaine (EMLA) cream Apply 1 application topically as needed. 30 g 3  .  losartan (COZAAR) 50 MG tablet Take 100 mg by mouth daily.     . Multiple Vitamins-Minerals (CENTRUM SILVER) tablet Take 1 tablet by mouth daily.     . Omega-3 Fatty Acids (FISH OIL) 1000 MG CAPS Take 1,000 mg by mouth daily.     Marland Kitchen omeprazole (PRILOSEC) 20 MG capsule Take 20 mg by mouth daily.     . ONE TOUCH ULTRA TEST test strip     . pioglitazone (ACTOS) 30 MG tablet Take 30 mg by mouth daily.     . calcitRIOL (ROCALTROL) 0.25 MCG capsule Take 0.5 mcg by mouth 3 (three) times a week.     Marland Kitchen glimepiride (AMARYL) 1 MG tablet Take 0.5 mg by mouth daily with breakfast.      No current facility-administered medications for this visit.     Past Medical History:  Diagnosis Date  . Adenoma of colon   . Adenomatous colon polyp   . Anemia    IDA  . Cataract   . Cervical stenosis of spinal canal   . Chronic kidney disease    stage 3  . Diabetes mellitus without complication ([place])   . GERD (gastroesophageal  reflux disease)   . Glaucoma   . Heme positive stool   . History of UTI   . Hx of gallstones   . Hyperkalemia   . Hyperlipidemia   . Hyperlipidemia   . Hypertension   . Lumbar stenosis   . Lymphedema   . Neuromuscular disorder ([place])   . Neuropathy associated with endocrine disorder ([place])   . Obesity   . OSA on CPAP    on CPAP  . Osteoarthritis   . Osteoarthritis   . Port-A-Cath in place   . Retinopathy due to secondary diabetes ([place])   . Secondary hyperparathyroidism of renal origin [facility])     Past Surgical History:  Procedure Laterality Date  . ACDF X2    . BACK SURGERY    . Cataract extraction right    . catarect extraction left    . CHOLECYSTECTOMY    . COLONOSCOPY WITH PROPOFOL N/A 12/02/2016   Procedure: COLONOSCOPY WITH PROPOFOL;  Surgeon: Manya Silvas, MD;  Location[facility] ENDOSCOPY;  Service: Endoscopy;  Laterality: N/A;  . COLONOSCOPY WITH PROPOFOL N/A 11/10/2017   Procedure: COLONOSCOPY WITH PROPOFOL;  Surgeon: Toledo, Benay Pike, MD;  Location: [facility]  ENDOSCOPY;  Service: Gastroenterology;  Laterality: N/A;  . colonoscopy with removal lesions by snare    . Endoscoic carpal tunnel release    . ESOPHAGOGASTRODUODENOSCOPY N/A 11/10/2017   Procedure: ESOPHAGOGASTRODUODENOSCOPY (EGD);  Surgeon: Toledo, Benay Pike, MD;  Location: [facility] ENDOSCOPY;  Service: Gastroenterology;  Laterality: N/A;  . ESOPHAGOGASTRODUODENOSCOPY (EGD) WITH PROPOFOL N/A 12/02/2016   Procedure: ESOPHAGOGASTRODUODENOSCOPY (EGD) WITH PROPOFOL;  Surgeon: Manya Silvas, MD;  Location: [facility] ENDOSCOPY;  Service: Endoscopy;  Laterality: N/A;  . IR IMAGING GUIDED PORT INSERTION  10/01/2017  . Laminectomy posterior lumbar facetectomy and formaninotomy w/Decomp    . Laminectony posterior cervicle decomp w/Facectomy and foraminotomy    . VEIN LIGATION AND STRIPPING Left     Social History Social History   Tobacco Use  . Smoking status: Never Smoker  . Smokeless tobacco: Never Used  Substance Use Topics  . Alcohol use: No  . Drug use: No     Family History Family History  Problem Relation Age of Onset  . Coronary artery disease Mother   . Diabetes type II Mother   . Hypertension Mother   . Tuberculosis Father   . Stroke Father   . Diabetes type II Sister   . Migraines Sister   . Alcohol abuse Brother   . Coronary artery disease Brother   . Diabetes type II Brother   . Kidney cancer Brother   . Kidney disease Brother   . Heart attack Brother   . Hypertension Brother     Allergies  Allergen Reactions  . Hydrocodone-Acetaminophen Other (See Comments)    Sedation and GI upset, pt is not allergic to acetaminophen  . Statins Other (See Comments)  . Tramadol-Acetaminophen Nausea And Vomiting    GI upset, pt is not allergic to acetaminophen    Review of Systems: Negative Unless Checked Constitutional: [] ?Weight loss[] ?Fever[] ?Chills Cardiac:[] ?Chest pain[] ? Atrial Fibrillation[] ?Palpitations [] ?Shortness of breath when laying flat [] ?Shortness of  breath with exertion. [] ?Shortness of breath at rest Vascular: [] ?Pain in legs with walking[] ?Pain in legswith standing[] ?Pain in legs when laying flat [] ?Claudication  [] ?Pain in feet when laying flat [] ?History of DVT [] ?Phlebitis [x] ?Swelling in legs [] ?Varicose veins [x] ?Non-healing ulcers Pulmonary: [] ?Uses home oxygen [] ?Productive cough[] ?Hemoptysis [] ?Wheeze [] ?COPD [] ?Asthma Neurologic: [] ?Dizziness[] ?Seizures [] ?Blackouts[] ?History of stroke [] ?History of TIA[] ?Aphasia [] ?Temporary Blindness[] ?Weaknessor numbness in arm [] ?Weakness or numbnessin  leg Musculoskeletal:[] ?Joint swelling [] ?Joint pain [] ?Low back pain  [] ? History of Knee Replacement [] ?Arthritis [] ?back Surgeries[] ? Spinal Stenosis  Hematologic:[] ?Easy bruising[] ?Easy bleeding [] ?Hypercoagulable state [x] ?Anemic Gastrointestinal:[] ?Diarrhea [] ?Vomiting[x] ?Gastroesophageal reflux/heartburn[] ?Difficulty swallowing. [] ?Abdominal pain Genitourinary: [] ?Chronic kidney disease [] ?Difficulturination [] ?Anuric[] ?Blood in urine [] ?Frequenturination [] ?Burning with urination[] ?Hematuria Skin: [] ?Rashes [x] ?Ulcers [x] ?Wounds Psychological: [] ?History of anxiety[] ?History of major depression  [] ? Memory Difficulties  Physical Examination  BP (!) 152/65 (BP Location: Right Arm)   Pulse 80   Resp 16   Ht 5\' 4"  (1.626 m)   Wt 267 lb (121.1 kg)   BMI 45.83 kg/m  Gen:  WD/WN, NAD.  Obese Head: [place]/AT, No temporalis wasting. Ear/Nose/Throat: Hearing grossly intact, nares w/o erythema or drainage Eyes: Conjunctiva clear. Sclera non-icteric Neck: Supple.  Trachea midline Pulmonary:  Good air movement, no use of accessory muscles.  Cardiac: RRR, no JVD Vascular:  Vessel Right Left  Radial Palpable Palpable                          PT  not palpable  not palpable  DP  not palpable  not palpable    Musculoskeletal: M/S 5/5  throughout.  No deformity or atrophy.  3+ right lower extremity edema, 2-3+ left lower extremity edema.  Marked dermal thickening and stasis changes to the left lower leg with moderate changes on the right.  Ulceration on the medial aspect of the left lower leg just above the ankle.  This is about the size of a quarter.  It does appear superficial. Neurologic: Sensation grossly intact in extremities.  Symmetrical.  Speech is fluent.  Psychiatric: Judgment intact, Mood & affect appropriate for pt's clinical situation. Dermatologic: Left lower leg wound as above       Labs Recent Results (from the past 2160 hour(s))  Hemoglobin and Hematocrit, Blood     Status: Abnormal   Collection Time: 09/27/18  2:04 PM  Result Value Ref Range   Hemoglobin 9.1 (L) 12.0 - 15.0 g/dL   HCT 29.1 (L) 36.0 - 46.0 %    Comment: Performed at [facility], [address] [place], [place] [address]  Hemoglobin and Hematocrit, Blood     Status: Abnormal   Collection Time: 10/11/18  2:17 PM  Result Value Ref Range   Hemoglobin 9.3 (L) 12.0 - 15.0 g/dL   HCT 29.9 (L) 36.0 - 46.0 %    Comment: Performed at [facility], [address] [place], [place] [address]  Hemoglobin and Hematocrit, Blood     Status: Abnormal   Collection Time: 10/25/18  2:18 PM  Result Value Ref Range   Hemoglobin 9.5 (L) 12.0 - 15.0 g/dL   HCT 29.7 (L) 36.0 - 46.0 %    Comment: Performed at [facility], [address] [place], [place] [address]  Ferritin     Status: None   Collection Time: 11/08/18  9:48 AM  Result Value Ref Range   Ferritin 178 11 - 307 ng/mL    Comment: Performed at [facility], [address] [place], [place] [address]  Basic metabolic panel     Status: Abnormal   Collection Time: 11/08/18  9:48 AM  Result Value Ref Range   Sodium 139 135 - 145 mmol/L   Potassium 4.4 3.5 - 5.1 mmol/L   Chloride 106 98 - 111 mmol/L   CO2 22 22 - 32 mmol/L   Glucose, Bld 122  (H) 70 - 99 mg/dL   BUN 55 (H)  8 - 23 mg/dL   Creatinine, Ser 1.80 (H) 0.44 - 1.00 mg/dL   Calcium 9.7 8.9 - 10.3 mg/dL   GFR calc non Af Amer 26 (L) >60 mL/min   GFR calc Af Amer 30 (L) >60 mL/min   Anion gap 11 5 - 15    Comment: Performed at [facility], [address], [place] [address]  CBC with Differential/Platelet     Status: Abnormal   Collection Time: 11/08/18  9:48 AM  Result Value Ref Range   WBC 3.8 (L) 4.0 - 10.5 K/uL   RBC 2.82 (L) 3.87 - 5.11 MIL/uL   Hemoglobin 9.2 (L) 12.0 - 15.0 g/dL   HCT 29.2 (L) 36.0 - 46.0 %   MCV 103.5 (H) 80.0 - 100.0 fL   MCH 32.6 26.0 - 34.0 pg   MCHC 31.5 30.0 - 36.0 g/dL   RDW 14.5 11.5 - 15.5 %   Platelets 137 (L) 150 - 400 K/uL   nRBC 0.0 0.0 - 0.2 %   Neutrophils Relative % 51 %   Neutro Abs 1.9 1.7 - 7.7 K/uL   Lymphocytes Relative 29 %   Lymphs Abs 1.1 0.7 - 4.0 K/uL   Monocytes Relative 14 %   Monocytes Absolute 0.5 0.1 - 1.0 K/uL   Eosinophils Relative 5 %   Eosinophils Absolute 0.2 0.0 - 0.5 K/uL   Basophils Relative 1 %   Basophils Absolute 0.1 0.0 - 0.1 K/uL   Immature Granulocytes 0 %   Abs Immature Granulocytes 0.01 0.00 - 0.07 K/uL    Comment: Performed at [facility], [address] [place], [place] [address]  Hemoglobin and Hematocrit, Blood     Status: Abnormal   Collection Time: 11/22/18 10:13 AM  Result Value Ref Range   Hemoglobin 9.5 (L) 12.0 - 15.0 g/dL   HCT 30.4 (L) 36.0 - 46.0 %    Comment: Performed at [facility], [address] [place], [place] [address]  Hemoglobin and Hematocrit, Blood     Status: Abnormal   Collection Time: 12/06/18 10:19 AM  Result Value Ref Range   Hemoglobin 9.4 (L) 12.0 - 15.0 g/dL   HCT 29.8 (L) 36.0 - 46.0 %    Comment: Performed at [facility], [address], [place], [place] [address]    Radiology No results found.  Assessment/Plan  Essential hypertension blood pressure control important in  reducing the progression of atherosclerotic disease. On appropriate oral medications.   Lymphedema The daily use of compression stockings, leg elevation, and increasing her activity would be of great benefit.  A lymphedema pump would be a good adjuvant therapy as well.  The patient is clearly developed stage II-III lymphedema with ulceration and swelling.  Severe obesity (BMI >= 40) (HCC) Increased weight worsens lower extremity swelling and weight loss would be of benefit.  CKD (chronic kidney disease) stage 3, GFR 30-59 ml/min Renal issues worsen lower extremity swelling  Venous ulcer of left leg (HCC) We are going to have to put her back in Unna boots today.  We will do the 3 layer Unna boot today and change this weekly.  We will plan to reassess this in 3 to 4 weeks about coming out of the leg.  A lymphedema pump would be a good adjuvant option.  She is already had a laser ablation of the left great saphenous vein and when this was rechecked with a duplex at last ultrasound the ablation  remained.  She does have deep venous reflux on the left.  Her right leg is also very swollen and compression and a lymphedema pump will be helpful there.    Leotis Pain, MD  12/16/2018 11:23 AM    This note was created with Dragon medical transcription system.  Any errors from dictation are purely unintentional

## 2018-12-16 NOTE — Assessment & Plan Note (Signed)
blood pressure control important in reducing the progression of atherosclerotic disease. On appropriate oral medications.  

## 2018-12-16 NOTE — Assessment & Plan Note (Signed)
We are going to have to put her back in Unna boots today.  We will do the 3 layer Unna boot today and change this weekly.  We will plan to reassess this in 3 to 4 weeks about coming out of the leg.  A lymphedema pump would be a good adjuvant option.  She is already had a laser ablation of the left great saphenous vein and when this was rechecked with a duplex at last ultrasound the ablation remained.  She does have deep venous reflux on the left.  Her right leg is also very swollen and compression and a lymphedema pump will be helpful there.

## 2018-12-16 NOTE — Assessment & Plan Note (Signed)
Renal issues worsen lower extremity swelling

## 2018-12-16 NOTE — Assessment & Plan Note (Signed)
The daily use of compression stockings, leg elevation, and increasing her activity would be of great benefit.  A lymphedema pump would be a good adjuvant therapy as well.  The patient is clearly developed stage II-III lymphedema with ulceration and swelling.

## 2018-12-16 NOTE — Assessment & Plan Note (Signed)
Increased weight worsens lower extremity swelling and weight loss would be of benefit.

## 2018-12-20 ENCOUNTER — Inpatient Hospital Stay: Payer: Medicare HMO | Attending: Hematology and Oncology

## 2018-12-20 ENCOUNTER — Other Ambulatory Visit: Payer: Self-pay

## 2018-12-20 ENCOUNTER — Inpatient Hospital Stay: Payer: Medicare HMO

## 2018-12-20 VITALS — BP 154/71 | HR 80 | Temp 97.6°F | Resp 18

## 2018-12-20 DIAGNOSIS — D631 Anemia in chronic kidney disease: Secondary | ICD-10-CM | POA: Diagnosis present

## 2018-12-20 DIAGNOSIS — N183 Chronic kidney disease, stage 3 unspecified: Secondary | ICD-10-CM | POA: Diagnosis present

## 2018-12-20 DIAGNOSIS — R5383 Other fatigue: Secondary | ICD-10-CM | POA: Diagnosis not present

## 2018-12-20 DIAGNOSIS — Z79899 Other long term (current) drug therapy: Secondary | ICD-10-CM | POA: Diagnosis not present

## 2018-12-20 DIAGNOSIS — D7589 Other specified diseases of blood and blood-forming organs: Secondary | ICD-10-CM | POA: Insufficient documentation

## 2018-12-20 DIAGNOSIS — R0602 Shortness of breath: Secondary | ICD-10-CM | POA: Diagnosis not present

## 2018-12-20 LAB — HEMOGLOBIN AND HEMATOCRIT, BLOOD
HCT: 28.7 % — ABNORMAL LOW (ref 36.0–46.0)
Hemoglobin: 9 g/dL — ABNORMAL LOW (ref 12.0–15.0)

## 2018-12-20 MED ORDER — EPOETIN ALFA-EPBX 10000 UNIT/ML IJ SOLN
10000.0000 [IU] | Freq: Once | INTRAMUSCULAR | Status: AC
  Administered 2018-12-20: 10000 [IU] via SUBCUTANEOUS

## 2018-12-20 NOTE — Patient Instructions (Signed)

## 2018-12-23 ENCOUNTER — Other Ambulatory Visit: Payer: Self-pay

## 2018-12-23 ENCOUNTER — Ambulatory Visit (INDEPENDENT_AMBULATORY_CARE_PROVIDER_SITE_OTHER): Payer: Medicare HMO | Admitting: Nurse Practitioner

## 2018-12-23 VITALS — BP 155/77 | HR 80 | Resp 12 | Ht 64.0 in | Wt 260.0 lb

## 2018-12-23 DIAGNOSIS — L97929 Non-pressure chronic ulcer of unspecified part of left lower leg with unspecified severity: Secondary | ICD-10-CM

## 2018-12-23 DIAGNOSIS — I83029 Varicose veins of left lower extremity with ulcer of unspecified site: Secondary | ICD-10-CM | POA: Diagnosis not present

## 2018-12-23 NOTE — Progress Notes (Signed)
History of Present Illness  There is no documented history at this time  Assessments & Plan   There are no diagnoses linked to this encounter.    Additional instructions  Subjective:  Patient presents with venous ulcer of the Left lower extremity.    Procedure:  3 layer unna wrap was placed Left lower extremity.   Plan:   Follow up in one week.  

## 2018-12-25 ENCOUNTER — Encounter (INDEPENDENT_AMBULATORY_CARE_PROVIDER_SITE_OTHER): Payer: Self-pay | Admitting: Nurse Practitioner

## 2018-12-30 ENCOUNTER — Ambulatory Visit (INDEPENDENT_AMBULATORY_CARE_PROVIDER_SITE_OTHER): Payer: Medicare HMO | Admitting: Nurse Practitioner

## 2018-12-30 ENCOUNTER — Other Ambulatory Visit: Payer: Self-pay

## 2018-12-30 VITALS — BP 152/62 | HR 71 | Resp 17 | Ht 64.0 in | Wt 266.0 lb

## 2018-12-30 DIAGNOSIS — I83029 Varicose veins of left lower extremity with ulcer of unspecified site: Secondary | ICD-10-CM

## 2018-12-30 DIAGNOSIS — L97929 Non-pressure chronic ulcer of unspecified part of left lower leg with unspecified severity: Secondary | ICD-10-CM | POA: Diagnosis not present

## 2018-12-30 NOTE — Progress Notes (Signed)
History of Present Illness  There is no documented history at this time  Assessments & Plan   There are no diagnoses linked to this encounter.    Additional instructions  Subjective:  Patient presents with venous ulcer of the Left lower extremity.    Procedure:  3 layer unna wrap was placed Left lower extremity.   Plan:   Follow up in one week.  

## 2018-12-31 ENCOUNTER — Emergency Department
Admission: EM | Admit: 2018-12-31 | Discharge: 2018-12-31 | Disposition: A | Discharge status: Home / Self Care | Payer: Medicare HMO | Attending: Emergency Medicine | Admitting: Emergency Medicine

## 2018-12-31 ENCOUNTER — Ambulatory Visit
Admission: EM | Admit: 2018-12-31 | Discharge: 2018-12-31 | Disposition: A | Discharge status: Home / Self Care | Payer: Medicare HMO | Source: Home / Self Care | Attending: Emergency Medicine | Admitting: Emergency Medicine

## 2018-12-31 ENCOUNTER — Encounter: Payer: Self-pay | Admitting: Emergency Medicine

## 2018-12-31 ENCOUNTER — Other Ambulatory Visit: Payer: Self-pay

## 2018-12-31 ENCOUNTER — Emergency Department: Payer: Medicare HMO

## 2018-12-31 DIAGNOSIS — E1122 Type 2 diabetes mellitus with diabetic chronic kidney disease: Secondary | ICD-10-CM | POA: Diagnosis not present

## 2018-12-31 DIAGNOSIS — Z95810 Presence of automatic (implantable) cardiac defibrillator: Secondary | ICD-10-CM | POA: Insufficient documentation

## 2018-12-31 DIAGNOSIS — I129 Hypertensive chronic kidney disease with stage 1 through stage 4 chronic kidney disease, or unspecified chronic kidney disease: Secondary | ICD-10-CM | POA: Insufficient documentation

## 2018-12-31 DIAGNOSIS — R1030 Lower abdominal pain, unspecified: Secondary | ICD-10-CM

## 2018-12-31 DIAGNOSIS — R1031 Right lower quadrant pain: Secondary | ICD-10-CM | POA: Insufficient documentation

## 2018-12-31 DIAGNOSIS — R1011 Right upper quadrant pain: Secondary | ICD-10-CM | POA: Diagnosis not present

## 2018-12-31 DIAGNOSIS — R5383 Other fatigue: Secondary | ICD-10-CM | POA: Diagnosis not present

## 2018-12-31 DIAGNOSIS — N183 Chronic kidney disease, stage 3 unspecified: Secondary | ICD-10-CM | POA: Diagnosis not present

## 2018-12-31 LAB — CBC
HCT: 30.3 % — ABNORMAL LOW (ref 36.0–46.0)
Hemoglobin: 9.3 g/dL — ABNORMAL LOW (ref 12.0–15.0)
MCH: 31.4 pg (ref 26.0–34.0)
MCHC: 30.7 g/dL (ref 30.0–36.0)
MCV: 102.4 fL — ABNORMAL HIGH (ref 80.0–100.0)
Platelets: 163 10*3/uL (ref 150–400)
RBC: 2.96 MIL/uL — ABNORMAL LOW (ref 3.87–5.11)
RDW: 14.3 % (ref 11.5–15.5)
WBC: 4.1 10*3/uL (ref 4.0–10.5)
nRBC: 0 % (ref 0.0–0.2)

## 2018-12-31 LAB — COMPREHENSIVE METABOLIC PANEL
ALT: 17 U/L (ref 0–44)
AST: 24 U/L (ref 15–41)
Albumin: 3.7 g/dL (ref 3.5–5.0)
Alkaline Phosphatase: 55 U/L (ref 38–126)
Anion gap: 8 (ref 5–15)
BUN: 35 mg/dL — ABNORMAL HIGH (ref 8–23)
CO2: 24 mmol/L (ref 22–32)
Calcium: 10.5 mg/dL — ABNORMAL HIGH (ref 8.9–10.3)
Chloride: 106 mmol/L (ref 98–111)
Creatinine, Ser: 1.56 mg/dL — ABNORMAL HIGH (ref 0.44–1.00)
GFR calc Af Amer: 36 mL/min — ABNORMAL LOW (ref >60)
GFR calc non Af Amer: 31 mL/min — ABNORMAL LOW (ref >60)
Glucose, Bld: 153 mg/dL — ABNORMAL HIGH (ref 70–99)
Potassium: 5.1 mmol/L (ref 3.5–5.1)
Sodium: 138 mmol/L (ref 135–145)
Total Bilirubin: 0.8 mg/dL (ref 0.3–1.2)
Total Protein: 7.5 g/dL (ref 6.5–8.1)

## 2018-12-31 LAB — URINALYSIS, COMPLETE (UACMP) WITH MICROSCOPIC
Bilirubin Urine: NEGATIVE
Glucose, UA: NEGATIVE mg/dL
Hgb urine dipstick: NEGATIVE
Ketones, ur: NEGATIVE mg/dL
Leukocytes,Ua: NEGATIVE
Nitrite: NEGATIVE
Protein, ur: NEGATIVE mg/dL
Specific Gravity, Urine: 1.005 (ref 1.005–1.030)
pH: 5 (ref 5.0–8.0)

## 2018-12-31 LAB — GLUCOSE, CAPILLARY: Glucose-Capillary: 116 mg/dL — ABNORMAL HIGH (ref 70–99)

## 2018-12-31 LAB — LIPASE, BLOOD: Lipase: 35 U/L (ref 11–51)

## 2018-12-31 MED ORDER — SODIUM CHLORIDE 0.9% FLUSH: 3.0000 mL | Freq: Once | INTRAVENOUS | Status: DC

## 2018-12-31 NOTE — ED Notes (Signed)
No peripheral IV placed this visit.   Discharge instructions reviewed with patient. Questions fielded by this RN. Patient verbalizes understanding of instructions. Patient discharged home in stable condition per provider. No acute distress noted at time of discharge.   Pt wheeled to car and loaded

## 2018-12-31 NOTE — ED Provider Notes (Signed)
[facility] - [place], [place]   Name: Sara Bright DOB: Aug 01, 1940 MRN: [id number] CSN: [id number] PCP: Sallee Lange, NP  Arrival date and time:  12/31/18 1540  Chief Complaint:  Abdominal Pain (RLQ)   NOTE: Prior to seeing the patient today, I have reviewed the triage nursing documentation and vital signs. Clinical staff has updated patient's PMH/PSHx, current medication list, and drug allergies/intolerances to ensure comprehensive history available to assist in medical decision making.   History:   HPI: Sara Bright is a 78 y.o. female who presents today with complaints of abdominal pain for 3 weeks. The pain has been increasing over the past few weeks. The pain starts in her right lower abdomen and radiates down her leg and up towards her upper abdomen and mid back. No changes in appetite noted, but pain has limited her ability to complete ADLs. Patient thought she was constipated and tried OTC laxative, but pain continued. Gallbladder is removed, but appendix is still intact. Patient has never had pain like this before. Ranges from 7/10 at its worse to 4/10 with tylenol.   Past Medical History:  Diagnosis Date  . Adenoma of colon   . Adenomatous colon polyp   . Anemia    IDA  . Cataract   . Cervical stenosis of spinal canal   . Chronic kidney disease    stage 3  . Diabetes mellitus without complication ([place])   . GERD (gastroesophageal reflux disease)   . Glaucoma   . Heme positive stool   . History of UTI   . Hx of gallstones   . Hyperkalemia   . Hyperlipidemia   . Hyperlipidemia   . Hypertension   . Lumbar stenosis   . Lymphedema   . Neuromuscular disorder ([place])   . Neuropathy associated with endocrine disorder ([place])   . Obesity   . OSA on CPAP    on CPAP  . Osteoarthritis   . Osteoarthritis   . Port-A-Cath in place   . Retinopathy due to secondary diabetes ([place])   . Secondary hyperparathyroidism of renal origin [facility])     Past  Surgical History:  Procedure Laterality Date  . ACDF X2    . BACK SURGERY    . Cataract extraction right    . catarect extraction left    . CHOLECYSTECTOMY    . COLONOSCOPY WITH PROPOFOL N/A 12/02/2016   Procedure: COLONOSCOPY WITH PROPOFOL;  Surgeon: Manya Silvas, MD;  Location[facility] ENDOSCOPY;  Service: Endoscopy;  Laterality: N/A;  . COLONOSCOPY WITH PROPOFOL N/A 11/10/2017   Procedure: COLONOSCOPY WITH PROPOFOL;  Surgeon: Toledo, Benay Pike, MD;  Location: [facility] ENDOSCOPY;  Service: Gastroenterology;  Laterality: N/A;  . colonoscopy with removal lesions by snare    . Endoscoic carpal tunnel release    . ESOPHAGOGASTRODUODENOSCOPY N/A 11/10/2017   Procedure: ESOPHAGOGASTRODUODENOSCOPY (EGD);  Surgeon: Toledo, Benay Pike, MD;  Location: [facility] ENDOSCOPY;  Service: Gastroenterology;  Laterality: N/A;  . ESOPHAGOGASTRODUODENOSCOPY (EGD) WITH PROPOFOL N/A 12/02/2016   Procedure: ESOPHAGOGASTRODUODENOSCOPY (EGD) WITH PROPOFOL;  Surgeon: Manya Silvas, MD;  Location: [facility] ENDOSCOPY;  Service: Endoscopy;  Laterality: N/A;  . IR IMAGING GUIDED PORT INSERTION  10/01/2017  . Laminectomy posterior lumbar facetectomy and formaninotomy w/Decomp    . Laminectony posterior cervicle decomp w/Facectomy and foraminotomy    . VEIN LIGATION AND STRIPPING Left     Family History  Problem Relation Age of Onset  . Coronary artery disease Mother   . Diabetes type II  Mother   . Hypertension Mother   . Tuberculosis Father   . Stroke Father   . Diabetes type II Sister   . Migraines Sister   . Alcohol abuse Brother   . Coronary artery disease Brother   . Diabetes type II Brother   . Kidney cancer Brother   . Kidney disease Brother   . Heart attack Brother   . Hypertension Brother     Social History   Tobacco Use  . Smoking status: Never Smoker  . Smokeless tobacco: Never Used  Substance Use Topics  . Alcohol use: No  . Drug use: No    Patient Active Problem List   Diagnosis Date Noted  .  Venous ulcer of left leg ([place]) 08/13/2018  . Macrocytosis 04/26/2018  . Chronic venous insufficiency 12/25/2016  . Lymphedema 12/25/2016  . Diabetes ([place]) 12/25/2016  . Essential hypertension 12/25/2016  . CKD (chronic kidney disease) stage 3, GFR 30-59 ml/min 06/23/2016  . Swelling of limb 12/31/2015  . Severe obesity (BMI >= 40) ([place]) 12/31/2015  . Varicose veins of both legs with edema 12/31/2015  . VV (varicose veins) 11/27/2015  . Other iron deficiency anemia 10/08/2015  . Anemia due to stage 3 chronic kidney disease 10/08/2015    Home Medications:    Current Meds  Medication Sig  . acetaminophen (TYLENOL) 500 MG tablet Take 500 mg by mouth every 6 (six) hours as needed.   Marland Kitchen albuterol (PROVENTIL HFA;VENTOLIN HFA) 108 (90 Base) MCG/ACT inhaler Inhale 2 puffs into the lungs every 6 (six) hours as needed for wheezing or shortness of breath.  Marland Kitchen amLODipine (NORVASC) 5 MG tablet Take 10 mg by mouth daily.   Marland Kitchen aspirin EC 81 MG tablet Take 81 mg by mouth daily.  . calcitRIOL (ROCALTROL) 0.25 MCG capsule Take 0.5 mcg by mouth 3 (three) times a week.   . carvedilol (COREG) 6.25 MG tablet Take 6.25 mg by mouth 2 (two) times daily.  . ferrous sulfate 325 (65 FE) MG tablet Take 325 mg by mouth daily.  . furosemide (LASIX) 40 MG tablet Take 40 mg by mouth daily.   Marland Kitchen gabapentin (NEURONTIN) 100 MG capsule Take 100 mg by mouth 2 (two) times daily.   Marland Kitchen gemfibrozil (LOPID) 600 MG tablet Take 600 mg by mouth daily.   . hydrALAZINE (APRESOLINE) 50 MG tablet Take 25 mg by mouth 3 (three) times daily.   Marland Kitchen latanoprost (XALATAN) 0.005 % ophthalmic solution Place 1 drop into both eyes.   Marland Kitchen lidocaine-prilocaine (EMLA) cream Apply 1 application topically as needed.  Marland Kitchen losartan (COZAAR) 50 MG tablet Take 100 mg by mouth daily.   . Multiple Vitamins-Minerals (CENTRUM SILVER) tablet Take 1 tablet by mouth daily.   . Omega-3 Fatty Acids (FISH OIL) 1000 MG CAPS Take 1,000 mg by mouth daily.   Marland Kitchen omeprazole  (PRILOSEC) 20 MG capsule Take 20 mg by mouth daily.   . pioglitazone (ACTOS) 30 MG tablet Take 30 mg by mouth daily.     Allergies:   Hydrocodone-acetaminophen, Statins, and Tramadol-acetaminophen  Review of Systems (ROS): Review of Systems  Constitutional: Positive for fatigue. Negative for activity change and appetite change.  Gastrointestinal: Positive for abdominal pain. Negative for constipation and nausea.  Genitourinary: Negative for difficulty urinating, urgency and vaginal pain.  Musculoskeletal: Negative for back pain and gait problem.     Vital Signs: Today's Vitals   12/31/18 1549 12/31/18 1550  BP:  (!) 148/58  Pulse:  66  Resp:  18  Temp:  98.7 F (37.1 C)  TempSrc:  Oral  SpO2:  99%  Weight: 260 lb (117.9 kg)   Height: 5\' 4"  (1.626 m)   PainSc: 7      Physical Exam: Physical Exam Vitals signs and nursing note reviewed.  Constitutional:      General: She is not in acute distress.    Appearance: She is well-developed.  HENT:     Head: Normocephalic and atraumatic.  Eyes:     Conjunctiva/sclera: Conjunctivae normal.  Neck:     Musculoskeletal: Neck supple.  Cardiovascular:     Rate and Rhythm: Normal rate and regular rhythm.     Heart sounds: No murmur.  Pulmonary:     Effort: Pulmonary effort is normal. No respiratory distress.     Breath sounds: Normal breath sounds.  Abdominal:     Palpations: Abdomen is soft.     Tenderness: There is abdominal tenderness in the right upper quadrant and right lower quadrant.  Skin:    General: Skin is warm and dry.  Neurological:     Mental Status: She is alert.      Urgent Care Treatments / Results:   LABS: PLEASE NOTE: all labs that were ordered this encounter are listed, however only abnormal results are displayed. Labs Reviewed - No data to display  EKG: -None  RADIOLOGY: No results found.  PROCEDURES: Procedures  MEDICATIONS RECEIVED THIS VISIT: Medications - No data to display   PERTINENT CLINICAL COURSE NOTES/UPDATES:   Initial Impression / Assessment and Plan / Urgent Care Course:  Pertinent labs & imaging results that were available during my care of the patient were personally reviewed by me and considered in my medical decision making (see lab/imaging section of note for values and interpretations).  AZANI BROGDON is a 78 y.o. female who presents to [facility] today with complaints of abdominal pain, diagnosed with the same, and told to go to ER for further imaging.   Final Clinical Impressions / Urgent Care Diagnoses:   Final diagnoses:  Right upper quadrant abdominal pain    New Prescriptions:  [place] Controlled Substance Registry consulted? Not Applicable  No orders of the defined types were placed in this encounter.     Discharge Instructions     Head ER for further imaging.     Recommended Follow up Care:  Patient encouraged to follow up with the following provider within the specified time frame, or sooner as dictated by the severity of her symptoms. As always, she was instructed that for any urgent/emergent care needs, she should seek care either here or in the emergency department for more immediate evaluation.   Gertie Baron, DNP, NP-c    Gertie Baron, NP 12/31/18 1622

## 2018-12-31 NOTE — ED Notes (Signed)
Report given to Noel RN 

## 2018-12-31 NOTE — ED Notes (Signed)
Patient ambulated to and from room commode with two-person assist. Patient is sitting on side of the stretcher for comfort.

## 2018-12-31 NOTE — ED Triage Notes (Signed)
R lower abdominal pain x 4 weeks.

## 2018-12-31 NOTE — ED Triage Notes (Signed)
Patient in today c/o RLQ pain x 3 weeks. Patient denies urinary symptoms, vaginal discharge, nausea, emesis or diarrhea.

## 2018-12-31 NOTE — Discharge Instructions (Addendum)
Head ER for further imaging.

## 2018-12-31 NOTE — ED Provider Notes (Signed)
[facility] Emergency Department Provider Note       Time seen: ----------------------------------------- 9:20 PM on 12/31/2018 -----------------------------------------   I have reviewed the triage vital signs and the nursing notes.  HISTORY   Chief Complaint Abdominal Pain    HPI Sara Bright is a 78 y.o. female with a history of anemia, chronic kidney disease, diabetes, hyperlipidemia, hypertension, neuromuscular disorder, osteoarthritis who presents to the ED for abdominal pain for the past 4 weeks.  Patient describes right lower abdominal pain, was seen at an urgent care and sent here for further evaluation.  Pain is 7 out of 10 in the right side of her abdomen.  She reports normal bowel movements but denies other complaints.  Past Medical History:  Diagnosis Date  . Adenoma of colon   . Adenomatous colon polyp   . Anemia    IDA  . Cataract   . Cervical stenosis of spinal canal   . Chronic kidney disease    stage 3  . Diabetes mellitus without complication ([place])   . GERD (gastroesophageal reflux disease)   . Glaucoma   . Heme positive stool   . History of UTI   . Hx of gallstones   . Hyperkalemia   . Hyperlipidemia   . Hyperlipidemia   . Hypertension   . Lumbar stenosis   . Lymphedema   . Neuromuscular disorder ([place])   . Neuropathy associated with endocrine disorder ([place])   . Obesity   . OSA on CPAP    on CPAP  . Osteoarthritis   . Osteoarthritis   . Port-A-Cath in place   . Retinopathy due to secondary diabetes ([place])   . Secondary hyperparathyroidism of renal origin [facility])     Patient Active Problem List   Diagnosis Date Noted  . Venous ulcer of left leg ([place]) 08/13/2018  . Macrocytosis 04/26/2018  . Chronic venous insufficiency 12/25/2016  . Lymphedema 12/25/2016  . Diabetes ([place]) 12/25/2016  . Essential hypertension 12/25/2016  . CKD (chronic kidney disease) stage 3, GFR 30-59 ml/min 06/23/2016  . Swelling of limb  12/31/2015  . Severe obesity (BMI >= 40) ([place]) 12/31/2015  . Varicose veins of both legs with edema 12/31/2015  . VV (varicose veins) 11/27/2015  . Other iron deficiency anemia 10/08/2015  . Anemia due to stage 3 chronic kidney disease 10/08/2015    Past Surgical History:  Procedure Laterality Date  . ACDF X2    . BACK SURGERY    . Cataract extraction right    . catarect extraction left    . CHOLECYSTECTOMY    . COLONOSCOPY WITH PROPOFOL N/A 12/02/2016   Procedure: COLONOSCOPY WITH PROPOFOL;  Surgeon: Manya Silvas, MD;  Location: [facility] ENDOSCOPY;  Service: Endoscopy;  Laterality: N/A;  . COLONOSCOPY WITH PROPOFOL N/A 11/10/2017   Procedure: COLONOSCOPY WITH PROPOFOL;  Surgeon: Toledo, Benay Pike, MD;  Location: [facility] ENDOSCOPY;  Service: Gastroenterology;  Laterality: N/A;  . colonoscopy with removal lesions by snare    . Endoscoic carpal tunnel release    . ESOPHAGOGASTRODUODENOSCOPY N/A 11/10/2017   Procedure: ESOPHAGOGASTRODUODENOSCOPY (EGD);  Surgeon: Toledo, Benay Pike, MD;  Location: [facility] ENDOSCOPY;  Service: Gastroenterology;  Laterality: N/A;  . ESOPHAGOGASTRODUODENOSCOPY (EGD) WITH PROPOFOL N/A 12/02/2016   Procedure: ESOPHAGOGASTRODUODENOSCOPY (EGD) WITH PROPOFOL;  Surgeon: Manya Silvas, MD;  Location: [facility] ENDOSCOPY;  Service: Endoscopy;  Laterality: N/A;  . IR IMAGING GUIDED PORT INSERTION  10/01/2017  . Laminectomy posterior lumbar facetectomy and formaninotomy w/Decomp    . Laminectony  posterior cervicle decomp w/Facectomy and foraminotomy    . VEIN LIGATION AND STRIPPING Left     Allergies Hydrocodone-acetaminophen, Statins, and Tramadol-acetaminophen  Social History Social History   Tobacco Use  . Smoking status: Never Smoker  . Smokeless tobacco: Never Used  Substance Use Topics  . Alcohol use: No  . Drug use: No   Review of Systems Constitutional: Negative for fever. Cardiovascular: Negative for chest pain. Respiratory: Negative for shortness of  breath. Gastrointestinal: Positive for abdominal pain Musculoskeletal: Negative for back pain. Skin: Negative for rash. Neurological: Negative for headaches, focal weakness or numbness.  All systems negative/normal/unremarkable except as stated in the HPI  ____________________________________________   PHYSICAL EXAM:  VITAL SIGNS: ED Triage Vitals [12/31/18 1719]  Enc Vitals Group     BP (!) 160/58     Pulse Rate 63     Resp 20     Temp 98.1 F (36.7 C)     Temp Source Oral     SpO2 96 %     Weight 260 lb (117.9 kg)     Height 5\' 4"  (1.626 m)     Head Circumference      Peak Flow      Pain Score 7     Pain Loc      Pain Edu?      Excl. in [place]?     Constitutional: Alert and oriented.  No acute distress Eyes: Conjunctivae are normal. Normal extraocular movements. Cardiovascular: Normal rate, regular rhythm. No murmurs, rubs, or gallops. Respiratory: Normal respiratory effort without tachypnea nor retractions. Breath sounds are clear and equal bilaterally. No wheezes/rales/rhonchi. Gastrointestinal: Nonfocal lower abdominal tenderness, no rebound or guarding.  Normal bowel sounds. Musculoskeletal: Nontender with normal range of motion in extremities. No lower extremity tenderness nor edema. Neurologic:  Normal speech and language. No gross focal neurologic deficits are appreciated.  Skin:  Skin is warm, dry and intact. No rash noted. Psychiatric: Mood and affect are normal. Speech and behavior are normal.  ____________________________________________  ED COURSE:  As part of my medical decision making, I reviewed the following data within the [place] History obtained from family if available, nursing notes, old chart and ekg, as well as notes from prior ED visits. Patient presented for abdominal pain, we will assess with labs and imaging as indicated at this time.   Procedures  Sara Bright was evaluated in Emergency Department on 12/31/2018 for  the symptoms described in the history of present illness. She was evaluated in the context of the global COVID-19 pandemic, which necessitated consideration that the patient might be at risk for infection with the SARS-CoV-2 virus that causes COVID-19. Institutional protocols and algorithms that pertain to the evaluation of patients at risk for COVID-19 are in a state of rapid change based on information released by regulatory bodies including the CDC and federal and state organizations. These policies and algorithms were followed during the patient's care in the ED.  ____________________________________________   LABS (pertinent positives/negatives)  Labs Reviewed  COMPREHENSIVE METABOLIC PANEL - Abnormal; Notable for the following components:      Result Value   Glucose, Bld 153 (*)    BUN 35 (*)    Creatinine, Ser 1.56 (*)    Calcium 10.5 (*)    GFR calc non Af Amer 31 (*)    GFR calc Af Amer 36 (*)    All other components within normal limits  CBC - Abnormal; Notable for the following components:  RBC 2.96 (*)    Hemoglobin 9.3 (*)    HCT 30.3 (*)    MCV 102.4 (*)    All other components within normal limits  URINALYSIS, COMPLETE (UACMP) WITH MICROSCOPIC - Abnormal; Notable for the following components:   Color, Urine STRAW (*)    APPearance CLEAR (*)    Bacteria, UA RARE (*)    All other components within normal limits  GLUCOSE, CAPILLARY - Abnormal; Notable for the following components:   Glucose-Capillary 116 (*)    All other components within normal limits  LIPASE, BLOOD  CBG MONITORING, ED    RADIOLOGY Images were viewed by me  CT the abdomen pelvis without contrast  IMPRESSION:  No acute intra-abdominal process to provide cause for patient's  symptoms.   Postsurgical changes from prior cholecystectomy and lumbar spinal  surgery.   Mild body wall edema, nonspecific.  ____________________________________________   DIFFERENTIAL DIAGNOSIS   Constipation, gas  pain, renal colic, UTI, pyelonephritis, diverticulitis, appendicitis  FINAL ASSESSMENT AND PLAN  Abdominal pain   Plan: The patient had presented for persistent abdominal pain. Patient's labs were at her baseline. Patient's imaging did not reveal any acute process, testing appears to be unremarkable.  She is cleared for outpatient follow-up.   Laurence Aly, MD    Note: This note was generated in part or whole with voice recognition software. Voice recognition is usually quite accurate but there are transcription errors that can and very often do occur. I apologize for any typographical errors that were not detected and corrected.     Earleen Newport, MD 12/31/18 2245

## 2019-01-02 ENCOUNTER — Encounter (INDEPENDENT_AMBULATORY_CARE_PROVIDER_SITE_OTHER): Payer: Self-pay | Admitting: Nurse Practitioner

## 2019-01-02 ENCOUNTER — Other Ambulatory Visit: Payer: Self-pay

## 2019-01-03 ENCOUNTER — Inpatient Hospital Stay: Payer: Medicare HMO

## 2019-01-03 VITALS — BP 130/70 | HR 69

## 2019-01-03 DIAGNOSIS — N183 Chronic kidney disease, stage 3 unspecified: Secondary | ICD-10-CM

## 2019-01-03 DIAGNOSIS — D631 Anemia in chronic kidney disease: Secondary | ICD-10-CM

## 2019-01-03 LAB — HEMOGLOBIN AND HEMATOCRIT, BLOOD
HCT: 31.7 % — ABNORMAL LOW (ref 36.0–46.0)
Hemoglobin: 9.5 g/dL — ABNORMAL LOW (ref 12.0–15.0)

## 2019-01-03 MED ORDER — EPOETIN ALFA-EPBX 10000 UNIT/ML IJ SOLN
10000.0000 [IU] | Freq: Once | INTRAMUSCULAR | Status: AC
  Administered 2019-01-03: 10000 [IU] via SUBCUTANEOUS

## 2019-01-03 NOTE — Patient Instructions (Signed)

## 2019-01-06 ENCOUNTER — Ambulatory Visit (INDEPENDENT_AMBULATORY_CARE_PROVIDER_SITE_OTHER): Payer: Medicare HMO | Admitting: Nurse Practitioner

## 2019-01-06 ENCOUNTER — Other Ambulatory Visit: Payer: Self-pay

## 2019-01-06 ENCOUNTER — Encounter (INDEPENDENT_AMBULATORY_CARE_PROVIDER_SITE_OTHER): Payer: Self-pay | Admitting: Nurse Practitioner

## 2019-01-06 VITALS — BP 161/66 | HR 74 | Resp 17 | Ht 64.0 in | Wt 259.0 lb

## 2019-01-06 DIAGNOSIS — I739 Peripheral vascular disease, unspecified: Secondary | ICD-10-CM | POA: Diagnosis not present

## 2019-01-06 DIAGNOSIS — I10 Essential (primary) hypertension: Secondary | ICD-10-CM | POA: Diagnosis not present

## 2019-01-06 DIAGNOSIS — I890 Lymphedema, not elsewhere classified: Secondary | ICD-10-CM | POA: Diagnosis not present

## 2019-01-06 DIAGNOSIS — I83029 Varicose veins of left lower extremity with ulcer of unspecified site: Secondary | ICD-10-CM | POA: Diagnosis not present

## 2019-01-06 DIAGNOSIS — L97929 Non-pressure chronic ulcer of unspecified part of left lower leg with unspecified severity: Secondary | ICD-10-CM

## 2019-01-07 DIAGNOSIS — I739 Peripheral vascular disease, unspecified: Secondary | ICD-10-CM | POA: Insufficient documentation

## 2019-01-07 NOTE — Progress Notes (Signed)
SUBJECTIVE:  Patient ID: Sara Bright, female    DOB: 07-20-1940, 78 y.o.   MRN: [id number] Chief Complaint  Patient presents with  . Follow-up    unna check    HPI  Sara Bright is a 78 y.o. female presents today for evaluation of the ulceration on her left lower extremity after several weeks of Unna wraps.  Today the patient has mostly healed the wound however there are 2 small pinpoint open areas.  The patient states that the wound feels much better.  There is also just some areas of discoloration present after healing.  The patient also complains of a tightness and cramping in her hip and leg when she ambulates.  The patient has had several back issues however her symptoms are concerning for peripheral artery disease.  Given the patient's risk factors it would be prudent to ensure that the patient does not have a component of peripheral artery disease.  The patient consistently wears her medical grade compression socks when she is not in a wraps.  She denies any fever, chills, nausea, vomiting or diarrhea.  She denies any chest pain shortness of breath.  Past Medical History:  Diagnosis Date  . Adenoma of colon   . Adenomatous colon polyp   . Anemia    IDA  . Cataract   . Cervical stenosis of spinal canal   . Chronic kidney disease    stage 3  . Diabetes mellitus without complication ([place])   . GERD (gastroesophageal reflux disease)   . Glaucoma   . Heme positive stool   . History of UTI   . Hx of gallstones   . Hyperkalemia   . Hyperlipidemia   . Hyperlipidemia   . Hypertension   . Lumbar stenosis   . Lymphedema   . Neuromuscular disorder ([place])   . Neuropathy associated with endocrine disorder ([place])   . Obesity   . OSA on CPAP    on CPAP  . Osteoarthritis   . Osteoarthritis   . Port-A-Cath in place   . Retinopathy due to secondary diabetes ([place])   . Secondary hyperparathyroidism of renal origin [facility])     Past Surgical History:  Procedure Laterality Date   . ACDF X2    . BACK SURGERY    . Cataract extraction right    . catarect extraction left    . CHOLECYSTECTOMY    . COLONOSCOPY WITH PROPOFOL N/A 12/02/2016   Procedure: COLONOSCOPY WITH PROPOFOL;  Surgeon: Manya Silvas, MD;  Location: [facility] ENDOSCOPY;  Service: Endoscopy;  Laterality: N/A;  . COLONOSCOPY WITH PROPOFOL N/A 11/10/2017   Procedure: COLONOSCOPY WITH PROPOFOL;  Surgeon: Toledo, Benay Pike, MD;  Location: [facility] ENDOSCOPY;  Service: Gastroenterology;  Laterality: N/A;  . colonoscopy with removal lesions by snare    . Endoscoic carpal tunnel release    . ESOPHAGOGASTRODUODENOSCOPY N/A 11/10/2017   Procedure: ESOPHAGOGASTRODUODENOSCOPY (EGD);  Surgeon: Toledo, Benay Pike, MD;  Location: [facility] ENDOSCOPY;  Service: Gastroenterology;  Laterality: N/A;  . ESOPHAGOGASTRODUODENOSCOPY (EGD) WITH PROPOFOL N/A 12/02/2016   Procedure: ESOPHAGOGASTRODUODENOSCOPY (EGD) WITH PROPOFOL;  Surgeon: Manya Silvas, MD;  Location: [facility] ENDOSCOPY;  Service: Endoscopy;  Laterality: N/A;  . IR IMAGING GUIDED PORT INSERTION  10/01/2017  . Laminectomy posterior lumbar facetectomy and formaninotomy w/Decomp    . Laminectony posterior cervicle decomp w/Facectomy and foraminotomy    . VEIN LIGATION AND STRIPPING Left     Social History   Socioeconomic History  . Marital status: Widowed  Spouse name: Not on file  . Number of children: Not on file  . Years of education: Not on file  . Highest education level: Not on file  Occupational History  . Not on file  Social Needs  . Financial resource strain: Not on file  . Food insecurity    Worry: Not on file    Inability: Not on file  . Transportation needs    Medical: Not on file    Non-medical: Not on file  Tobacco Use  . Smoking status: Never Smoker  . Smokeless tobacco: Never Used  Substance and Sexual Activity  . Alcohol use: No  . Drug use: No  . Sexual activity: Not on file  Lifestyle  . Physical activity    Days per week: Not on file     Minutes per session: Not on file  . Stress: Not on file  Relationships  . Social Herbalist on phone: Not on file    Gets together: Not on file    Attends religious service: Not on file    Active member of club or organization: Not on file    Attends meetings of clubs or organizations: Not on file    Relationship status: Not on file  . Intimate partner violence    Fear of current or ex partner: Not on file    Emotionally abused: Not on file    Physically abused: Not on file    Forced sexual activity: Not on file  Other Topics Concern  . Not on file  Social History Narrative  . Not on file    Family History  Problem Relation Age of Onset  . Coronary artery disease Mother   . Diabetes type II Mother   . Hypertension Mother   . Tuberculosis Father   . Stroke Father   . Diabetes type II Sister   . Migraines Sister   . Alcohol abuse Brother   . Coronary artery disease Brother   . Diabetes type II Brother   . Kidney cancer Brother   . Kidney disease Brother   . Heart attack Brother   . Hypertension Brother     Allergies  Allergen Reactions  . Hydrocodone-Acetaminophen Other (See Comments)    Sedation and GI upset, pt is not allergic to acetaminophen  . Statins Other (See Comments)  . Tramadol-Acetaminophen Nausea And Vomiting    GI upset, pt is not allergic to acetaminophen     Review of Systems   Review of Systems: Negative Unless Checked Constitutional: [] Weight loss  [] Fever  [] Chills Cardiac: [] Chest pain   []  Atrial Fibrillation  [] Palpitations   [] Shortness of breath when laying flat   [] Shortness of breath with exertion. [] Shortness of breath at rest Vascular:  [] Pain in legs with walking   [] Pain in legs with standing [] Pain in legs when laying flat   [x] Claudication    [] Pain in feet when laying flat    [] History of DVT   [] Phlebitis   [] Swelling in legs   [x] Varicose veins   [] Non-healing ulcers Pulmonary:   [] Uses home oxygen   [] Productive cough    [] Hemoptysis   [] Wheeze  [] COPD   [] Asthma Neurologic:  [] Dizziness   [] Seizures  [] Blackouts [] History of stroke   [] History of TIA  [] Aphasia   [] Temporary Blindness   [] Weakness or numbness in arm   [] Weakness or numbness in leg Musculoskeletal:   [] Joint swelling   [] Joint pain   [x] Low back pain  []   History of Knee Replacement [x] Arthritis [] back Surgeries  []  Spinal Stenosis    Hematologic:  [] Easy bruising  [] Easy bleeding   [] Hypercoagulable state   [x] Anemic Gastrointestinal:  [] Diarrhea   [] Vomiting  [] Gastroesophageal reflux/heartburn   [] Difficulty swallowing. [] Abdominal pain Genitourinary:  [x] Chronic kidney disease   [] Difficult urination  [] Anuric   [] Blood in urine [] Frequent urination  [] Burning with urination   [] Hematuria Skin:  [] Rashes   [] Ulcers [] Wounds Psychological:  [] History of anxiety   []  History of major depression  []  Memory Difficulties      OBJECTIVE:   Physical Exam  BP (!) 161/66 (BP Location: Right Arm)   Pulse 74   Resp 17   Ht 5\' 4"  (1.626 m)   Wt 259 lb (117.5 kg)   BMI 44.46 kg/m   Gen: WD/WN, NAD Head: [place]/AT, No temporalis wasting.  Ear/Nose/Throat: Hearing grossly intact, nares w/o erythema or drainage Eyes: PER, EOMI, sclera nonicteric.  Neck: Supple, no masses.  No JVD.  Pulmonary:  Good air movement, no use of accessory muscles.  Cardiac: RRR Vascular:  Area of discoloration near left medial ankle, pinpoint open areas.  Very shallow.  2+ edema bilaterally Vessel Right Left  Radial Palpable Palpable  Dorsalis Pedis  trace palpable  trace palpable  Posterior Tibial  not palpable  not palpable   Gastrointestinal: soft, non-distended. No guarding/no peritoneal signs.  Musculoskeletal: M/S 5/5 throughout.  No deformity or atrophy.  Neurologic: Pain and light touch intact in extremities.  Symmetrical.  Speech is fluent. Motor exam as listed above. Psychiatric: Judgment intact, Mood & affect appropriate for pt's clinical situation.  Dermatologic: Bilateral stasis dermatitis.  No changes consistent with cellulitis. Lymph : No Cervical lymphadenopathy, lichenification left lower extremity       ASSESSMENT AND PLAN:  1. Venous ulcer of left leg ([place]) After a brief discussion with the patient she would like to utilize topical methods for treating the ulceration versus the need arise.  The patient will cover the wound and a bandage with triple antibiotic ointment.  She will continue to follow conservative treatment methods as outlined below.  2. Lymphedema Patient will continue with daily use of her lymphedema pump.  She will utilize her medical grade 1 compression stockings on a daily basis.  She will continue to elevate her lower extremities and exercise release 30 minutes daily.  We will see the patient back in 2 weeks to ensure that the ulceration as well as the swelling has not worsened.  3. Essential hypertension Continue antihypertensive medications as already ordered, these medications have been reviewed and there are no changes at this time.   4. Claudication [facility]) The patient describes of pain with walking and relieved with rest that are consistent with claudication.  This could be neurogenic claudication due to issues with her back however given her risk factors it is prudent to check ABIs to ensure the patient does not have any peripheral arterial disease.   Current Outpatient Medications on File Prior to Visit  Medication Sig Dispense Refill  . acetaminophen (TYLENOL) 500 MG tablet Take 500 mg by mouth every 6 (six) hours as needed.     Marland Kitchen albuterol (PROVENTIL HFA;VENTOLIN HFA) 108 (90 Base) MCG/ACT inhaler Inhale 2 puffs into the lungs every 6 (six) hours as needed for wheezing or shortness of breath.    Marland Kitchen amLODipine (NORVASC) 5 MG tablet Take 10 mg by mouth daily.     Marland Kitchen aspirin EC 81 MG tablet Take 81 mg by mouth  daily.    . ASSURE COMFORT LANCETS 30G MISC     . carvedilol (COREG) 6.25 MG tablet Take 6.25  mg by mouth 2 (two) times daily.  12  . ferrous sulfate 325 (65 FE) MG tablet Take 325 mg by mouth daily.    . furosemide (LASIX) 40 MG tablet Take 40 mg by mouth daily.     Marland Kitchen gabapentin (NEURONTIN) 100 MG capsule Take 100 mg by mouth 2 (two) times daily.     Marland Kitchen gemfibrozil (LOPID) 600 MG tablet Take 600 mg by mouth daily.     Marland Kitchen glimepiride (AMARYL) 1 MG tablet Take 0.5 mg by mouth daily with breakfast.     . hydrALAZINE (APRESOLINE) 50 MG tablet Take 25 mg by mouth 3 (three) times daily.   12  . Lancet Devices (ADJUSTABLE LANCING DEVICE) MISC     . latanoprost (XALATAN) 0.005 % ophthalmic solution Place 1 drop into both eyes.     Marland Kitchen lidocaine-prilocaine (EMLA) cream Apply 1 application topically as needed. 30 g 3  . losartan (COZAAR) 50 MG tablet Take 100 mg by mouth daily.     . Multiple Vitamins-Minerals (CENTRUM SILVER) tablet Take 1 tablet by mouth daily.     . Omega-3 Fatty Acids (FISH OIL) 1000 MG CAPS Take 1,000 mg by mouth daily.     Marland Kitchen omeprazole (PRILOSEC) 20 MG capsule Take 20 mg by mouth daily.     . ONE TOUCH ULTRA TEST test strip     . pioglitazone (ACTOS) 30 MG tablet Take 30 mg by mouth daily.     . calcitRIOL (ROCALTROL) 0.25 MCG capsule Take 0.5 mcg by mouth 3 (three) times a week.      No current facility-administered medications on file prior to visit.     There are no Patient Instructions on file for this visit. No follow-ups on file.   Kris Hartmann, NP  This note was completed with Sales executive.  Any errors are purely unintentional.

## 2019-01-08 ENCOUNTER — Encounter (INDEPENDENT_AMBULATORY_CARE_PROVIDER_SITE_OTHER): Payer: Self-pay | Admitting: Nurse Practitioner

## 2019-01-17 ENCOUNTER — Other Ambulatory Visit: Payer: Self-pay

## 2019-01-17 ENCOUNTER — Inpatient Hospital Stay: Payer: Medicare HMO | Attending: Hematology and Oncology

## 2019-01-17 ENCOUNTER — Inpatient Hospital Stay: Payer: Medicare HMO

## 2019-01-17 VITALS — BP 137/58 | HR 71 | Temp 96.3°F | Resp 20

## 2019-01-17 DIAGNOSIS — Z836 Family history of other diseases of the respiratory system: Secondary | ICD-10-CM | POA: Diagnosis not present

## 2019-01-17 DIAGNOSIS — D631 Anemia in chronic kidney disease: Secondary | ICD-10-CM | POA: Diagnosis present

## 2019-01-17 DIAGNOSIS — Z833 Family history of diabetes mellitus: Secondary | ICD-10-CM | POA: Insufficient documentation

## 2019-01-17 DIAGNOSIS — E785 Hyperlipidemia, unspecified: Secondary | ICD-10-CM | POA: Insufficient documentation

## 2019-01-17 DIAGNOSIS — Z8051 Family history of malignant neoplasm of kidney: Secondary | ICD-10-CM | POA: Insufficient documentation

## 2019-01-17 DIAGNOSIS — R5383 Other fatigue: Secondary | ICD-10-CM | POA: Diagnosis not present

## 2019-01-17 DIAGNOSIS — Z811 Family history of alcohol abuse and dependence: Secondary | ICD-10-CM | POA: Diagnosis not present

## 2019-01-17 DIAGNOSIS — I129 Hypertensive chronic kidney disease with stage 1 through stage 4 chronic kidney disease, or unspecified chronic kidney disease: Secondary | ICD-10-CM | POA: Diagnosis not present

## 2019-01-17 DIAGNOSIS — Z823 Family history of stroke: Secondary | ICD-10-CM | POA: Diagnosis not present

## 2019-01-17 DIAGNOSIS — Z79899 Other long term (current) drug therapy: Secondary | ICD-10-CM | POA: Insufficient documentation

## 2019-01-17 DIAGNOSIS — R062 Wheezing: Secondary | ICD-10-CM | POA: Insufficient documentation

## 2019-01-17 DIAGNOSIS — R0602 Shortness of breath: Secondary | ICD-10-CM | POA: Insufficient documentation

## 2019-01-17 DIAGNOSIS — N183 Chronic kidney disease, stage 3 unspecified: Secondary | ICD-10-CM | POA: Diagnosis present

## 2019-01-17 DIAGNOSIS — D7589 Other specified diseases of blood and blood-forming organs: Secondary | ICD-10-CM | POA: Diagnosis not present

## 2019-01-17 DIAGNOSIS — Z841 Family history of disorders of kidney and ureter: Secondary | ICD-10-CM | POA: Insufficient documentation

## 2019-01-17 DIAGNOSIS — Z8249 Family history of ischemic heart disease and other diseases of the circulatory system: Secondary | ICD-10-CM | POA: Insufficient documentation

## 2019-01-17 LAB — HEMOGLOBIN AND HEMATOCRIT, BLOOD
HCT: 30.2 % — ABNORMAL LOW (ref 36.0–46.0)
Hemoglobin: 9.4 g/dL — ABNORMAL LOW (ref 12.0–15.0)

## 2019-01-17 MED ORDER — HEPARIN SOD (PORK) LOCK FLUSH 100 UNIT/ML IV SOLN
500.0000 [IU] | Freq: Once | INTRAVENOUS | Status: AC | PRN
  Administered 2019-01-17: 500 [IU]

## 2019-01-17 MED ORDER — SODIUM CHLORIDE 0.9% FLUSH
10.0000 mL | INTRAVENOUS | Status: DC | PRN
  Administered 2019-01-17: 10 mL
  Filled 2019-01-17: qty 10

## 2019-01-17 MED ORDER — EPOETIN ALFA-EPBX 10000 UNIT/ML IJ SOLN
10000.0000 [IU] | Freq: Once | INTRAMUSCULAR | Status: AC
  Administered 2019-01-17: 10000 [IU] via SUBCUTANEOUS

## 2019-01-20 ENCOUNTER — Ambulatory Visit (INDEPENDENT_AMBULATORY_CARE_PROVIDER_SITE_OTHER): Payer: Medicare HMO

## 2019-01-20 ENCOUNTER — Ambulatory Visit (INDEPENDENT_AMBULATORY_CARE_PROVIDER_SITE_OTHER): Payer: Medicare HMO | Admitting: Nurse Practitioner

## 2019-01-20 ENCOUNTER — Other Ambulatory Visit: Payer: Self-pay

## 2019-01-20 ENCOUNTER — Encounter (INDEPENDENT_AMBULATORY_CARE_PROVIDER_SITE_OTHER): Payer: Self-pay | Admitting: Nurse Practitioner

## 2019-01-20 VITALS — BP 149/69 | HR 71 | Resp 17 | Ht 64.0 in | Wt 257.0 lb

## 2019-01-20 DIAGNOSIS — I10 Essential (primary) hypertension: Secondary | ICD-10-CM

## 2019-01-20 DIAGNOSIS — I890 Lymphedema, not elsewhere classified: Secondary | ICD-10-CM

## 2019-01-20 DIAGNOSIS — K219 Gastro-esophageal reflux disease without esophagitis: Secondary | ICD-10-CM | POA: Insufficient documentation

## 2019-01-20 DIAGNOSIS — I739 Peripheral vascular disease, unspecified: Secondary | ICD-10-CM | POA: Diagnosis not present

## 2019-01-20 DIAGNOSIS — E785 Hyperlipidemia, unspecified: Secondary | ICD-10-CM | POA: Insufficient documentation

## 2019-01-23 ENCOUNTER — Encounter (INDEPENDENT_AMBULATORY_CARE_PROVIDER_SITE_OTHER): Payer: Self-pay | Admitting: Nurse Practitioner

## 2019-01-23 NOTE — Progress Notes (Signed)
SUBJECTIVE:  Patient ID: Sara Bright, female    DOB: 04-10-1940, 78 y.o.   MRN: [id number] Chief Complaint  Patient presents with  . Follow-up    ultrasound     HPI  Sara Bright is a 78 y.o. female that presents today for evaluation of her left lower extremity wound.  Previously the wound was mostly healed and the patient was advised to utilize Neosporin and a bandage.  Today the ulceration is completely healed.  The patient denies any issues with the lower extremity.  The patient also underwent noninvasive studies due to description of claudication and concern for possible peripheral artery disease.  The patient endorses wearing her medical grade 1 compression socks on a daily basis, as well as using her lymphedema pump and elevating as much as possible.  Today the patient underwent bilateral ABIs.  The right ABI was 1.17 in the left 1.06.  The patient had biphasic waveforms in the bilateral tibial arteries with good toe waveforms bilaterally.  Past Medical History:  Diagnosis Date  . Adenoma of colon   . Adenomatous colon polyp   . Anemia    IDA  . Cataract   . Cervical stenosis of spinal canal   . Chronic kidney disease    stage 3  . Diabetes mellitus without complication ([place])   . GERD (gastroesophageal reflux disease)   . Glaucoma   . Heme positive stool   . History of UTI   . Hx of gallstones   . Hyperkalemia   . Hyperlipidemia   . Hyperlipidemia   . Hypertension   . Lumbar stenosis   . Lymphedema   . Neuromuscular disorder ([place])   . Neuropathy associated with endocrine disorder ([place])   . Obesity   . OSA on CPAP    on CPAP  . Osteoarthritis   . Osteoarthritis   . Port-A-Cath in place   . Retinopathy due to secondary diabetes ([place])   . Secondary hyperparathyroidism of renal origin [facility])     Past Surgical History:  Procedure Laterality Date  . ACDF X2    . BACK SURGERY    . Cataract extraction right    . catarect extraction left    .  CHOLECYSTECTOMY    . COLONOSCOPY WITH PROPOFOL N/A 12/02/2016   Procedure: COLONOSCOPY WITH PROPOFOL;  Surgeon: Manya Silvas, MD;  Location: [facility] ENDOSCOPY;  Service: Endoscopy;  Laterality: N/A;  . COLONOSCOPY WITH PROPOFOL N/A 11/10/2017   Procedure: COLONOSCOPY WITH PROPOFOL;  Surgeon: Toledo, Benay Pike, MD;  Location: [facility] ENDOSCOPY;  Service: Gastroenterology;  Laterality: N/A;  . colonoscopy with removal lesions by snare    . Endoscoic carpal tunnel release    . ESOPHAGOGASTRODUODENOSCOPY N/A 11/10/2017   Procedure: ESOPHAGOGASTRODUODENOSCOPY (EGD);  Surgeon: Toledo, Benay Pike, MD;  Location: [facility] ENDOSCOPY;  Service: Gastroenterology;  Laterality: N/A;  . ESOPHAGOGASTRODUODENOSCOPY (EGD) WITH PROPOFOL N/A 12/02/2016   Procedure: ESOPHAGOGASTRODUODENOSCOPY (EGD) WITH PROPOFOL;  Surgeon: Manya Silvas, MD;  Location: [facility] ENDOSCOPY;  Service: Endoscopy;  Laterality: N/A;  . IR IMAGING GUIDED PORT INSERTION  10/01/2017  . Laminectomy posterior lumbar facetectomy and formaninotomy w/Decomp    . Laminectony posterior cervicle decomp w/Facectomy and foraminotomy    . VEIN LIGATION AND STRIPPING Left     Social History   Socioeconomic History  . Marital status: Widowed    Spouse name: Not on file  . Number of children: Not on file  . Years of education: Not on file  . Highest education  level: Not on file  Occupational History  . Not on file  Social Needs  . Financial resource strain: Not on file  . Food insecurity    Worry: Not on file    Inability: Not on file  . Transportation needs    Medical: Not on file    Non-medical: Not on file  Tobacco Use  . Smoking status: Never Smoker  . Smokeless tobacco: Never Used  Substance and Sexual Activity  . Alcohol use: No  . Drug use: No  . Sexual activity: Not on file  Lifestyle  . Physical activity    Days per week: Not on file    Minutes per session: Not on file  . Stress: Not on file  Relationships  . Social Product manager on phone: Not on file    Gets together: Not on file    Attends religious service: Not on file    Active member of club or organization: Not on file    Attends meetings of clubs or organizations: Not on file    Relationship status: Not on file  . Intimate partner violence    Fear of current or ex partner: Not on file    Emotionally abused: Not on file    Physically abused: Not on file    Forced sexual activity: Not on file  Other Topics Concern  . Not on file  Social History Narrative  . Not on file    Family History  Problem Relation Age of Onset  . Coronary artery disease Mother   . Diabetes type II Mother   . Hypertension Mother   . Tuberculosis Father   . Stroke Father   . Diabetes type II Sister   . Migraines Sister   . Alcohol abuse Brother   . Coronary artery disease Brother   . Diabetes type II Brother   . Kidney cancer Brother   . Kidney disease Brother   . Heart attack Brother   . Hypertension Brother     Allergies  Allergen Reactions  . Hydrocodone-Acetaminophen Other (See Comments)    Sedation and GI upset, pt is not allergic to acetaminophen  . Statins Other (See Comments)  . Tramadol-Acetaminophen Nausea And Vomiting    GI upset, pt is not allergic to acetaminophen     Review of Systems   Review of Systems: Negative Unless Checked Constitutional: [] Weight loss  [] Fever  [] Chills Cardiac: [] Chest pain   []  Atrial Fibrillation  [] Palpitations   [] Shortness of breath when laying flat   [] Shortness of breath with exertion. [] Shortness of breath at rest Vascular:  [] Pain in legs with walking   [] Pain in legs with standing [] Pain in legs when laying flat   [x] Claudication    [] Pain in feet when laying flat    [] History of DVT   [] Phlebitis   [] Swelling in legs   [] Varicose veins   [] Non-healing ulcers Pulmonary:   [] Uses home oxygen   [] Productive cough   [] Hemoptysis   [] Wheeze  [] COPD   [] Asthma Neurologic:  [] Dizziness   [] Seizures  [] Blackouts  [] History of stroke   [] History of TIA  [] Aphasia   [] Temporary Blindness   [] Weakness or numbness in arm   [] Weakness or numbness in leg Musculoskeletal:   [] Joint swelling   [] Joint pain   [x] Low back pain  []  History of Knee Replacement [x] Arthritis [] back Surgeries  []  Spinal Stenosis    Hematologic:  [] Easy bruising  [] Easy bleeding   [] Hypercoagulable state   [  x]Anemic Gastrointestinal:  [] Diarrhea   [] Vomiting  [x] Gastroesophageal reflux/heartburn   [] Difficulty swallowing. [] Abdominal pain Genitourinary:  [] Chronic kidney disease   [] Difficult urination  [] Anuric   [] Blood in urine [] Frequent urination  [] Burning with urination   [] Hematuria Skin:  [x] Rashes   [] Ulcers [] Wounds Psychological:  [] History of anxiety   []  History of major depression  []  Memory Difficulties      OBJECTIVE:   Physical Exam  BP (!) 149/69 (BP Location: Right Arm)   Pulse 71   Resp 17   Ht 5\' 4"  (1.626 m)   Wt 257 lb (116.6 kg)   BMI 44.11 kg/m   Gen: WD/WN, NAD Head: [place]/AT, No temporalis wasting.  Ear/Nose/Throat: Hearing grossly intact, nares w/o erythema or drainage Eyes: PER, EOMI, sclera nonicteric.  Neck: Supple, no masses.  No JVD.  Pulmonary:  Good air movement, no use of accessory muscles.  Cardiac: RRR Vascular:  2+ edema bilaterally Vessel Right Left  Radial Palpable Palpable  Dorsalis Pedis  trace palpable  trace palpable  Posterior Tibial  not palpable  not palpable   Gastrointestinal: soft, non-distended. No guarding/no peritoneal signs.  Musculoskeletal: M/S 5/5 throughout.  No deformity or atrophy.  Neurologic: Pain and light touch intact in extremities.  Symmetrical.  Speech is fluent. Motor exam as listed above. Psychiatric: Judgment intact, Mood & affect appropriate for pt's clinical situation. Dermatologic:  Bilateral stasis dermatitis. No Ulcers Noted.  No changes consistent with cellulitis. Lymph : No Cervical lymphadenopathy, bilateral dermal thickening        ASSESSMENT AND PLAN:  1. Lymphedema The patient is doing well following extensive Unna wraps for lower extremity ulceration.  The patient will continue with conservative therapy at this time.  She will continue to wear medical grade 1 compression stockings on a daily basis.  She will elevate his lower extremities as much as possible and continue to use her lymphedema pump at least an hour on a daily basis.  The patient is advised to contact us if she begins to develop any further ulcerations.  Otherwise, we will follow-up with the patient in 6 months. 2. PAD (peripheral artery disease) (HCC) The patient's claudication is more likely related to her neurogenic claudication based on the noninvasive studies today.  The patient does have some evidence of PAD with the biphasic waveforms.  We will continue to follow on an annual basis.  3. Essential hypertension Continue antihypertensive medications as already ordered, these medications have been reviewed and there are no changes at this time.    Current Outpatient Medications on File Prior to Visit  Medication Sig Dispense Refill  . acetaminophen (TYLENOL) 500 MG tablet Take 500 mg by mouth every 6 (six) hours as needed.     Marland Kitchen albuterol (PROVENTIL HFA;VENTOLIN HFA) 108 (90 Base) MCG/ACT inhaler Inhale 2 puffs into the lungs every 6 (six) hours as needed for wheezing or shortness of breath.    Marland Kitchen amLODipine (NORVASC) 5 MG tablet Take 10 mg by mouth daily.     Marland Kitchen aspirin EC 81 MG tablet Take 81 mg by mouth daily.    . ASSURE COMFORT LANCETS 30G MISC     . carvedilol (COREG) 6.25 MG tablet Take 6.25 mg by mouth 2 (two) times daily.  12  . ferrous sulfate 325 (65 FE) MG tablet Take 325 mg by mouth daily.    . furosemide (LASIX) 40 MG tablet Take 40 mg by mouth daily.     Marland Kitchen gabapentin (NEURONTIN) 100 MG capsule Take  100 mg by mouth 2 (two) times daily.     Marland Kitchen gemfibrozil (LOPID) 600 MG tablet Take 600 mg by mouth daily.     Marland Kitchen glimepiride (AMARYL) 1 MG  tablet Take 0.5 mg by mouth daily with breakfast.     . hydrALAZINE (APRESOLINE) 50 MG tablet Take 25 mg by mouth 3 (three) times daily.   12  . Lancet Devices (ADJUSTABLE LANCING DEVICE) MISC     . latanoprost (XALATAN) 0.005 % ophthalmic solution Place 1 drop into both eyes.     Marland Kitchen lidocaine-prilocaine (EMLA) cream Apply 1 application topically as needed. 30 g 3  . losartan (COZAAR) 50 MG tablet Take 100 mg by mouth daily.     . Multiple Vitamins-Minerals (CENTRUM SILVER) tablet Take 1 tablet by mouth daily.     . Omega-3 Fatty Acids (FISH OIL) 1000 MG CAPS Take 1,000 mg by mouth daily.     Marland Kitchen omeprazole (PRILOSEC) 20 MG capsule Take 20 mg by mouth daily.     . ONE TOUCH ULTRA TEST test strip     . pioglitazone (ACTOS) 30 MG tablet Take 30 mg by mouth daily.     . calcitRIOL (ROCALTROL) 0.25 MCG capsule Take 0.5 mcg by mouth 3 (three) times a week.      No current facility-administered medications on file prior to visit.     There are no Patient Instructions on file for this visit. No follow-ups on file.   Kris Hartmann, NP  This note was completed with Sales executive.  Any errors are purely unintentional.

## 2019-01-29 NOTE — Progress Notes (Signed)
[facility]  [address], [address] [place], [place] [address] Phone: [phone]  Fax: [phone]   Clinic Day:  01/31/2019  Referring physician: Sallee Lange, *  Chief Complaint: Sara Bright is a 78 y.o. female with anemia of chronic kidney disease who is seen for 2 month assessment.   HPI:  The patient was last seen in the hematology clinic on 11/08/2018. At that time, she was doing well. She denied any bleeding. Diet was good. Exam was stable. Hematocrit 29.2, hemoglobin 9.2, MCV 103.5. Ferritin 178. Creatinine 1.80 (CrCl 32.2 ml/min). Patient received Retacrit. Surveillance continued.   Patient received Retacrit every 2 weeks (11/22/2018 - 01/17/2019).   She had a 3 month follow up with Dr. Lucky Cowboy on 12/16/2018. She had bilateral leg edema. She had dermal thickening and weeping of the tissue and now would have at least stage II if not stage III lymphedema. She denied any fevers or chills. She continued the use of daily compression stockings. She was put back into a Haematologist.  She had a follow up with Eulogio Ditch, NP on 01/06/2019. The patient stated that the wound feels much better.  There was also some areas of discoloration present after healing. She had tightness and cramping in her hip and leg when she ambulated. She denied any chest pain or shortness of breath. She continued her current regimen. She was encouraged to exercise.   Patient was seen by Eulogio Ditch, NP on 01/20/2019. The ulceration was completely healed. The patient endorsed wearing her medical grade I compression socks on a daily basis, as well as using her lymphedema pump and elevating as much as possible. Patient continued conservative therapy. She continued  to wear medical grade I compression stockings on a daily basis.  Labs followed: 11/22/2018: Hematocrit 30.4. Hemoglobin 9.5. 12/06/2018: Hematocrit 29.8. Hemoglobin 9.4. 12/20/2018: Hematocrit 28.7. Hemoglobin 9.0.  01/03/2019: Hematocrit 31.7. Hemoglobin 9.5. 01/17/2019: Hematocrit 30.2. Hemoglobin 9.4.  During the interim, the patient felt "pretty good". Her leg swelling has significantly decreased and are healing nicely secondary to using unna boots and compression stockings. She still has shortness of breath. She reports not having to use her inhaler as much for wheezing. She has no diarrhea.  Her bowels are normal.  Her diabetes is under good control.    Past Medical History:  Diagnosis Date  . Adenoma of colon   . Adenomatous colon polyp   . Anemia    IDA  . Cataract   . Cervical stenosis of spinal canal   . Chronic kidney disease    stage 3  . Diabetes mellitus without complication ([place])   . GERD (gastroesophageal reflux disease)   . Glaucoma   . Heme positive stool   . History of UTI   . Hx of gallstones   . Hyperkalemia   . Hyperlipidemia   . Hyperlipidemia   . Hypertension   . Lumbar stenosis   . Lymphedema   . Neuromuscular disorder ([place])   . Neuropathy associated with endocrine disorder ([place])   . Obesity   . OSA on CPAP    on CPAP  . Osteoarthritis   . Osteoarthritis   . Port-A-Cath in place   . Retinopathy due to secondary diabetes ([place])   . Secondary hyperparathyroidism of renal origin [facility])     Past Surgical History:  Procedure Laterality Date  . ACDF X2    . BACK SURGERY    . Cataract extraction right    . catarect  extraction left    . CHOLECYSTECTOMY    . COLONOSCOPY WITH PROPOFOL N/A 12/02/2016   Procedure: COLONOSCOPY WITH PROPOFOL;  Surgeon: Manya Silvas, MD;  Location: [facility] ENDOSCOPY;  Service: Endoscopy;  Laterality: N/A;  . COLONOSCOPY WITH PROPOFOL N/A 11/10/2017   Procedure: COLONOSCOPY WITH PROPOFOL;  Surgeon: Toledo, Benay Pike, MD;  Location: [facility] ENDOSCOPY;  Service: Gastroenterology;  Laterality: N/A;  . colonoscopy with removal lesions by snare    . Endoscoic carpal tunnel release    . ESOPHAGOGASTRODUODENOSCOPY N/A 11/10/2017   Procedure:  ESOPHAGOGASTRODUODENOSCOPY (EGD);  Surgeon: Toledo, Benay Pike, MD;  Location: [facility] ENDOSCOPY;  Service: Gastroenterology;  Laterality: N/A;  . ESOPHAGOGASTRODUODENOSCOPY (EGD) WITH PROPOFOL N/A 12/02/2016   Procedure: ESOPHAGOGASTRODUODENOSCOPY (EGD) WITH PROPOFOL;  Surgeon: Manya Silvas, MD;  Location: [facility] ENDOSCOPY;  Service: Endoscopy;  Laterality: N/A;  . IR IMAGING GUIDED PORT INSERTION  10/01/2017  . Laminectomy posterior lumbar facetectomy and formaninotomy w/Decomp    . Laminectony posterior cervicle decomp w/Facectomy and foraminotomy    . VEIN LIGATION AND STRIPPING Left     Family History  Problem Relation Age of Onset  . Coronary artery disease Mother   . Diabetes type II Mother   . Hypertension Mother   . Tuberculosis Father   . Stroke Father   . Diabetes type II Sister   . Migraines Sister   . Alcohol abuse Brother   . Coronary artery disease Brother   . Diabetes type II Brother   . Kidney cancer Brother   . Kidney disease Brother   . Heart attack Brother   . Hypertension Brother     Social History:  reports that she has never smoked. She has never used smokeless tobacco. She reports that she does not drink alcohol or use drugs. She lives in [place]. The patient is alone today.  Allergies:  Allergies  Allergen Reactions  . Hydrocodone-Acetaminophen Other (See Comments)    Sedation and GI upset, pt is not allergic to acetaminophen  . Statins Other (See Comments)  . Tramadol-Acetaminophen Nausea And Vomiting    GI upset, pt is not allergic to acetaminophen    Current Medications: Current Outpatient Medications  Medication Sig Dispense Refill  . acetaminophen (TYLENOL) 500 MG tablet Take 500 mg by mouth every 6 (six) hours as needed.     Marland Kitchen albuterol (PROVENTIL HFA;VENTOLIN HFA) 108 (90 Base) MCG/ACT inhaler Inhale 2 puffs into the lungs every 6 (six) hours as needed for wheezing or shortness of breath.    Marland Kitchen amLODipine (NORVASC) 5 MG tablet Take 10 mg by mouth  daily.     Marland Kitchen aspirin EC 81 MG tablet Take 81 mg by mouth daily.    . ASSURE COMFORT LANCETS 30G MISC     . carvedilol (COREG) 6.25 MG tablet Take 6.25 mg by mouth 2 (two) times daily.  12  . ferrous sulfate 325 (65 FE) MG tablet Take 325 mg by mouth daily.    . furosemide (LASIX) 40 MG tablet Take 40 mg by mouth daily.     Marland Kitchen gabapentin (NEURONTIN) 100 MG capsule Take 100 mg by mouth 2 (two) times daily.     Marland Kitchen gemfibrozil (LOPID) 600 MG tablet Take 600 mg by mouth daily.     . hydrALAZINE (APRESOLINE) 50 MG tablet Take 25 mg by mouth 3 (three) times daily.   12  . Lancet Devices (ADJUSTABLE LANCING DEVICE) MISC     . latanoprost (XALATAN) 0.005 % ophthalmic solution Place 1 drop into both  eyes.     . lidocaine-prilocaine (EMLA) cream Apply 1 application topically as needed. 30 g 3  . losartan (COZAAR) 50 MG tablet Take 100 mg by mouth daily.     . Multiple Vitamins-Minerals (CENTRUM SILVER) tablet Take 1 tablet by mouth daily.     . Omega-3 Fatty Acids (FISH OIL) 1000 MG CAPS Take 1,000 mg by mouth daily.     Marland Kitchen omeprazole (PRILOSEC) 20 MG capsule Take 20 mg by mouth daily.     . ONE TOUCH ULTRA TEST test strip     . pioglitazone (ACTOS) 30 MG tablet Take 30 mg by mouth daily.     . calcitRIOL (ROCALTROL) 0.25 MCG capsule Take 0.5 mcg by mouth 3 (three) times a week.     Marland Kitchen glimepiride (AMARYL) 1 MG tablet Take 0.5 mg by mouth daily with breakfast.      No current facility-administered medications for this visit.    Facility-Administered Medications Ordered in Other Visits  Medication Dose Route Frequency Provider Last Rate Last Dose  . sodium chloride flush (NS) 0.9 % injection 10 mL  10 mL Intravenous PRN Nolon Stalls C, MD   10 mL at 01/31/19 1021    Review of Systems  Constitutional: Positive for weight loss (7 pounds since 11/08/2018). Negative for chills, diaphoresis, fever and malaise/fatigue.       Feels "pretty good".  HENT: Negative for congestion, ear pain, hearing loss,  sinus pain and sore throat.   Eyes: Negative.  Negative for blurred vision and double vision.  Respiratory: Positive for shortness of breath (on exertion) and wheezing (improved, uses inhaler). Negative for cough, hemoptysis and sputum production.   Cardiovascular: Negative.  Negative for chest pain, palpitations and leg swelling (improving).  Gastrointestinal: Negative.  Negative for abdominal pain, blood in stool, constipation, diarrhea, heartburn, melena, nausea and vomiting.       Normal bowel movements.  Genitourinary: Negative.  Negative for dysuria, frequency and urgency.  Musculoskeletal: Negative.  Negative for back pain, joint pain and myalgias.  Skin: Negative.   Neurological: Negative.  Negative for dizziness, tingling, sensory change, speech change, focal weakness and weakness.  Endo/Heme/Allergies: Does not bruise/bleed easily.       Diabetes, under good control.  No thyroid issues.  Psychiatric/Behavioral: Negative.  Negative for depression and memory loss. The patient is not nervous/anxious and does not have insomnia.   All other systems reviewed and are negative.   Performance status (ECOG): 1  Vitals Blood pressure 135/61, pulse 64, temperature (!) 96.2 F (35.7 C), temperature source Tympanic, resp. rate 18, height 5\' 4"  (1.626 m), weight 255 lb 11.7 oz (116 kg), SpO2 100 %.   Physical Exam  Constitutional: She is oriented to person, place, and time. She appears well-developed and well-nourished. No distress.  She has a cane by her side.  HENT:  Head: Normocephalic and atraumatic.  Mouth/Throat: Oropharynx is clear and moist. No oropharyngeal exudate.  Short dark brown/black wig.  Eyes: Pupils are equal, round, and reactive to light. Conjunctivae and EOM are normal. No scleral icterus.  Glasses.  Brown eyes.  Neck: Normal range of motion. Neck supple. No JVD present.  Cardiovascular: Normal rate, regular rhythm and normal heart sounds.  No murmur heard.  Pulmonary/Chest: Effort normal and breath sounds normal. No respiratory distress. She has no wheezes. She has no rales.  Abdominal: Soft. Bowel sounds are normal. She exhibits no distension and no mass. There is no abdominal tenderness. There is no rebound and no  guarding.  Musculoskeletal: Normal range of motion.        General: No edema.  Lymphadenopathy:       Head (right side): No preauricular, no posterior auricular and no occipital adenopathy present.       Head (left side): No preauricular, no posterior auricular and no occipital adenopathy present.    She has no cervical adenopathy.    She has no axillary adenopathy.       Right: No inguinal and no supraclavicular adenopathy present.       Left: No inguinal and no supraclavicular adenopathy present.  Neurological: She is alert and oriented to person, place, and time. She has normal reflexes.  Skin: Skin is warm and dry. No rash noted. She is not diaphoretic. No erythema. No pallor.  Psychiatric: She has a normal mood and affect. Her behavior is normal. Judgment and thought content normal.  Nursing note and vitals reviewed.   Appointment on 01/31/2019  Component Date Value Ref Range Status  . Sodium 01/31/2019 135  135 - 145 mmol/L Final  . Potassium 01/31/2019 4.6  3.5 - 5.1 mmol/L Final  . Chloride 01/31/2019 104  98 - 111 mmol/L Final  . CO2 01/31/2019 22  22 - 32 mmol/L Final  . Glucose, Bld 01/31/2019 206* 70 - 99 mg/dL Final  . BUN 01/31/2019 57* 8 - 23 mg/dL Final  . Creatinine, Ser 01/31/2019 1.86* 0.44 - 1.00 mg/dL Final  . Calcium 01/31/2019 9.9  8.9 - 10.3 mg/dL Final  . GFR calc non Af Amer 01/31/2019 25* >60 mL/min Final  . GFR calc Af Amer 01/31/2019 29* >60 mL/min Final  . Anion gap 01/31/2019 9  5 - 15 Final   Performed at [facility], [address] [place], [place] [address]  . WBC 01/31/2019 3.7* 4.0 - 10.5 K/uL Final  . RBC 01/31/2019 3.01* 3.87 - 5.11 MIL/uL Final  . Hemoglobin 01/31/2019  9.6* 12.0 - 15.0 g/dL Final  . HCT 01/31/2019 30.8* 36.0 - 46.0 % Final  . MCV 01/31/2019 102.3* 80.0 - 100.0 fL Final  . MCH 01/31/2019 31.9  26.0 - 34.0 pg Final  . MCHC 01/31/2019 31.2  30.0 - 36.0 g/dL Final  . RDW 01/31/2019 14.4  11.5 - 15.5 % Final  . Platelets 01/31/2019 145* 150 - 400 K/uL Final  . nRBC 01/31/2019 0.0  0.0 - 0.2 % Final  . Neutrophils Relative % 01/31/2019 51  % Final  . Neutro Abs 01/31/2019 1.9  1.7 - 7.7 K/uL Final  . Lymphocytes Relative 01/31/2019 28  % Final  . Lymphs Abs 01/31/2019 1.0  0.7 - 4.0 K/uL Final  . Monocytes Relative 01/31/2019 14  % Final  . Monocytes Absolute 01/31/2019 0.5  0.1 - 1.0 K/uL Final  . Eosinophils Relative 01/31/2019 5  % Final  . Eosinophils Absolute 01/31/2019 0.2  0.0 - 0.5 K/uL Final  . Basophils Relative 01/31/2019 2  % Final  . Basophils Absolute 01/31/2019 0.1  0.0 - 0.1 K/uL Final  . Immature Granulocytes 01/31/2019 0  % Final  . Abs Immature Granulocytes 01/31/2019 0.01  0.00 - 0.07 K/uL Final   Performed at [facility], [address] [place], [place] [address]    Assessment:  Sara Bright is a 78 y.o. female withanemia of chronic kidney disease.She has stage III chronic kidney disease. SPEP and free light chain ratio was normal on 11/12/2015.  She receivedAranesp 200 mcg on 09/02/2016, 12/22/2016, 03/23/2017, 07/20/2017, 08/17/2017,  and 09/15/2017. She received Aranesp 300 mcgon 10/06/2017, 10/19/2017, 01/04/2018, 03/08/2018,and03/05/2018.She began Retacrit10,000 units on 07/04/2018 (last 01/17/2019).  She received 1 unit of PRBCson 09/22/2017. She received Venoferx 4 (10/22/2015 - 11/05/2015), x4 (01/21/2016 - 02/11/2016), 11/24/2016, 06/22/2017, 09/14/2017, 10/01/2017, 10/12/2017, 10/26/2017, 11/16/2017, and 03/01/2018.  Ferritinhas been followed: 63 on 10/08/2015, 290 on 01/14/2016, 651 on 05/12/2016, 467 on 09/01/2016, 413 on 11/19/2016, 633 on 12/22/2016, 374 on 01/19/2017,  480 on 06/22/2017, 135 on 09/22/2017, 159 on 10/06/2017, 373 on 03/01/2018, 289 on 03/29/2018, 257 on 07/04/2018, 217 on 08/30/2018, 178 on 11/08/2018, and 191 on 01/31/2019.   EGDon 09/04/2019revealed an irregularZ-line irregular, at the gastroesophageal junction, normal stomach, and normal duodenum.Colonoscopyon 11/10/2017 revealed diverticulosis in the sigmoid colonand non-bleeding internal hemorrhoids.  She has macrocytic RBC indices. B12, folate, and TSH were normal on 04/26/2018.  Symptomatically, she is doing well.  Exam is unremarkable.  Plan: 1.   Labs today: CBC with diff, BMP, ferritin. 2.Anemia of chronic renal disease Hematocrit30.8. Hemoglobin9.6. Ferritin 191 today. Maintain ferritin >100. Retacrit today. 3.Macrocytosis MCV 102.3. She has had macrocytic RBC indices since07/26/2019. Shehas no known liver disease. B12 was771,folate29,andTSH 4.290 on 04/26/2018.             Continue to monitor. 4.Renal insufficiency Creatinine1.86 (CrCl 30.2 ml/min). Patient is followed by Dr. Holley Raring, nephrologist. 5.   RTC every 2 weeks x 6 for labs (hematocrit/hemoglobin) and +/- Retacrit. 6.   RTC in 12 weeks for MD assessment, labs (CBC with diff, BMP, ferritin), and +/- Retacrit.  I discussed the assessment and treatment plan with the patient.  The patient was provided an opportunity to ask questions and all were answered.  The patient agreed with the plan and demonstrated an understanding of the instructions.  The patient was advised to call back if the symptoms worsen or if the condition fails to improve as anticipated.   Lequita Asal, MD, PhD    01/31/2019, 11:13 AM  I, Selena Batten, am acting as scribe for [employer]. Mike Gip, MD, PhD.  I,  C. Mike Gip, MD, have reviewed the above documentation for accuracy and  completeness, and I agree with the above.

## 2019-01-31 ENCOUNTER — Inpatient Hospital Stay: Payer: Medicare HMO

## 2019-01-31 ENCOUNTER — Other Ambulatory Visit: Payer: Self-pay

## 2019-01-31 ENCOUNTER — Encounter: Payer: Self-pay | Admitting: Hematology and Oncology

## 2019-01-31 ENCOUNTER — Inpatient Hospital Stay (HOSPITAL_BASED_OUTPATIENT_CLINIC_OR_DEPARTMENT_OTHER): Payer: Medicare HMO | Admitting: Hematology and Oncology

## 2019-01-31 VITALS — BP 135/61 | HR 64 | Temp 96.2°F | Resp 18 | Ht 64.0 in | Wt 255.7 lb

## 2019-01-31 VITALS — BP 160/78 | HR 64 | Temp 96.5°F | Resp 18

## 2019-01-31 DIAGNOSIS — D631 Anemia in chronic kidney disease: Secondary | ICD-10-CM

## 2019-01-31 DIAGNOSIS — Z95828 Presence of other vascular implants and grafts: Secondary | ICD-10-CM

## 2019-01-31 DIAGNOSIS — D7589 Other specified diseases of blood and blood-forming organs: Secondary | ICD-10-CM

## 2019-01-31 DIAGNOSIS — I129 Hypertensive chronic kidney disease with stage 1 through stage 4 chronic kidney disease, or unspecified chronic kidney disease: Secondary | ICD-10-CM | POA: Diagnosis not present

## 2019-01-31 DIAGNOSIS — N183 Chronic kidney disease, stage 3 unspecified: Secondary | ICD-10-CM

## 2019-01-31 LAB — CBC WITH DIFFERENTIAL/PLATELET
Abs Immature Granulocytes: 0.01 10*3/uL (ref 0.00–0.07)
Basophils Absolute: 0.1 10*3/uL (ref 0.0–0.1)
Basophils Relative: 2 %
Eosinophils Absolute: 0.2 10*3/uL (ref 0.0–0.5)
Eosinophils Relative: 5 %
HCT: 30.8 % — ABNORMAL LOW (ref 36.0–46.0)
Hemoglobin: 9.6 g/dL — ABNORMAL LOW (ref 12.0–15.0)
Immature Granulocytes: 0 %
Lymphocytes Relative: 28 %
Lymphs Abs: 1 10*3/uL (ref 0.7–4.0)
MCH: 31.9 pg (ref 26.0–34.0)
MCHC: 31.2 g/dL (ref 30.0–36.0)
MCV: 102.3 fL — ABNORMAL HIGH (ref 80.0–100.0)
Monocytes Absolute: 0.5 10*3/uL (ref 0.1–1.0)
Monocytes Relative: 14 %
Neutro Abs: 1.9 10*3/uL (ref 1.7–7.7)
Neutrophils Relative %: 51 %
Platelets: 145 10*3/uL — ABNORMAL LOW (ref 150–400)
RBC: 3.01 MIL/uL — ABNORMAL LOW (ref 3.87–5.11)
RDW: 14.4 % (ref 11.5–15.5)
WBC: 3.7 10*3/uL — ABNORMAL LOW (ref 4.0–10.5)
nRBC: 0 % (ref 0.0–0.2)

## 2019-01-31 LAB — BASIC METABOLIC PANEL
Anion gap: 9 (ref 5–15)
BUN: 57 mg/dL — ABNORMAL HIGH (ref 8–23)
CO2: 22 mmol/L (ref 22–32)
Calcium: 9.9 mg/dL (ref 8.9–10.3)
Chloride: 104 mmol/L (ref 98–111)
Creatinine, Ser: 1.86 mg/dL — ABNORMAL HIGH (ref 0.44–1.00)
GFR calc Af Amer: 29 mL/min — ABNORMAL LOW (ref >60)
GFR calc non Af Amer: 25 mL/min — ABNORMAL LOW (ref >60)
Glucose, Bld: 206 mg/dL — ABNORMAL HIGH (ref 70–99)
Potassium: 4.6 mmol/L (ref 3.5–5.1)
Sodium: 135 mmol/L (ref 135–145)

## 2019-01-31 LAB — FERRITIN: Ferritin: 191 ng/mL (ref 11–307)

## 2019-01-31 MED ORDER — EPOETIN ALFA-EPBX 10000 UNIT/ML IJ SOLN
INTRAMUSCULAR | Status: AC
  Filled 2019-01-31: qty 1

## 2019-01-31 MED ORDER — HEPARIN SOD (PORK) LOCK FLUSH 100 UNIT/ML IV SOLN
500.0000 [IU] | Freq: Once | INTRAVENOUS | Status: AC
  Administered 2019-01-31: 500 [IU] via INTRAVENOUS

## 2019-01-31 MED ORDER — EPOETIN ALFA-EPBX 10000 UNIT/ML IJ SOLN
10000.0000 [IU] | Freq: Once | INTRAMUSCULAR | Status: AC
  Administered 2019-01-31: 10000 [IU] via SUBCUTANEOUS

## 2019-01-31 MED ORDER — SODIUM CHLORIDE 0.9% FLUSH
10.0000 mL | INTRAVENOUS | Status: DC | PRN
  Administered 2019-01-31: 10 mL via INTRAVENOUS
  Filled 2019-01-31: qty 10

## 2019-01-31 NOTE — Patient Instructions (Signed)

## 2019-01-31 NOTE — Progress Notes (Signed)
Patient states that she is doing well.

## 2019-02-14 ENCOUNTER — Other Ambulatory Visit: Payer: Self-pay

## 2019-02-14 ENCOUNTER — Inpatient Hospital Stay: Payer: Medicare HMO | Attending: Hematology and Oncology

## 2019-02-14 ENCOUNTER — Inpatient Hospital Stay: Payer: Medicare HMO

## 2019-02-14 VITALS — BP 145/73 | HR 67 | Resp 18

## 2019-02-14 DIAGNOSIS — R0602 Shortness of breath: Secondary | ICD-10-CM | POA: Diagnosis not present

## 2019-02-14 DIAGNOSIS — E785 Hyperlipidemia, unspecified: Secondary | ICD-10-CM | POA: Insufficient documentation

## 2019-02-14 DIAGNOSIS — I129 Hypertensive chronic kidney disease with stage 1 through stage 4 chronic kidney disease, or unspecified chronic kidney disease: Secondary | ICD-10-CM | POA: Diagnosis not present

## 2019-02-14 DIAGNOSIS — D7589 Other specified diseases of blood and blood-forming organs: Secondary | ICD-10-CM | POA: Insufficient documentation

## 2019-02-14 DIAGNOSIS — K219 Gastro-esophageal reflux disease without esophagitis: Secondary | ICD-10-CM | POA: Insufficient documentation

## 2019-02-14 DIAGNOSIS — N289 Disorder of kidney and ureter, unspecified: Secondary | ICD-10-CM | POA: Insufficient documentation

## 2019-02-14 DIAGNOSIS — Z823 Family history of stroke: Secondary | ICD-10-CM | POA: Insufficient documentation

## 2019-02-14 DIAGNOSIS — Z8249 Family history of ischemic heart disease and other diseases of the circulatory system: Secondary | ICD-10-CM | POA: Diagnosis not present

## 2019-02-14 DIAGNOSIS — D631 Anemia in chronic kidney disease: Secondary | ICD-10-CM | POA: Insufficient documentation

## 2019-02-14 DIAGNOSIS — Z8051 Family history of malignant neoplasm of kidney: Secondary | ICD-10-CM | POA: Insufficient documentation

## 2019-02-14 DIAGNOSIS — E119 Type 2 diabetes mellitus without complications: Secondary | ICD-10-CM | POA: Insufficient documentation

## 2019-02-14 DIAGNOSIS — Z79899 Other long term (current) drug therapy: Secondary | ICD-10-CM | POA: Diagnosis not present

## 2019-02-14 DIAGNOSIS — Z841 Family history of disorders of kidney and ureter: Secondary | ICD-10-CM | POA: Insufficient documentation

## 2019-02-14 DIAGNOSIS — Z811 Family history of alcohol abuse and dependence: Secondary | ICD-10-CM | POA: Insufficient documentation

## 2019-02-14 DIAGNOSIS — Z833 Family history of diabetes mellitus: Secondary | ICD-10-CM | POA: Diagnosis not present

## 2019-02-14 DIAGNOSIS — N183 Chronic kidney disease, stage 3 unspecified: Secondary | ICD-10-CM | POA: Diagnosis present

## 2019-02-14 LAB — HEMOGLOBIN AND HEMATOCRIT, BLOOD
HCT: 29.3 % — ABNORMAL LOW (ref 36.0–46.0)
Hemoglobin: 9.1 g/dL — ABNORMAL LOW (ref 12.0–15.0)

## 2019-02-14 MED ORDER — EPOETIN ALFA-EPBX 10000 UNIT/ML IJ SOLN
10000.0000 [IU] | Freq: Once | INTRAMUSCULAR | Status: AC
  Administered 2019-02-14: 10000 [IU] via SUBCUTANEOUS

## 2019-02-27 ENCOUNTER — Other Ambulatory Visit: Payer: Self-pay

## 2019-02-28 ENCOUNTER — Inpatient Hospital Stay: Payer: Medicare HMO

## 2019-02-28 VITALS — BP 133/69 | HR 65 | Temp 96.9°F | Resp 18

## 2019-02-28 DIAGNOSIS — N183 Chronic kidney disease, stage 3 unspecified: Secondary | ICD-10-CM

## 2019-02-28 DIAGNOSIS — I129 Hypertensive chronic kidney disease with stage 1 through stage 4 chronic kidney disease, or unspecified chronic kidney disease: Secondary | ICD-10-CM | POA: Diagnosis not present

## 2019-02-28 DIAGNOSIS — D631 Anemia in chronic kidney disease: Secondary | ICD-10-CM

## 2019-02-28 LAB — HEMOGLOBIN AND HEMATOCRIT, BLOOD
HCT: 29.2 % — ABNORMAL LOW (ref 36.0–46.0)
Hemoglobin: 9.1 g/dL — ABNORMAL LOW (ref 12.0–15.0)

## 2019-02-28 MED ORDER — EPOETIN ALFA-EPBX 10000 UNIT/ML IJ SOLN
10000.0000 [IU] | Freq: Once | INTRAMUSCULAR | Status: AC
  Administered 2019-02-28: 10000 [IU] via SUBCUTANEOUS

## 2019-02-28 NOTE — Patient Instructions (Signed)

## 2019-03-13 ENCOUNTER — Other Ambulatory Visit: Payer: Self-pay

## 2019-03-14 ENCOUNTER — Inpatient Hospital Stay: Payer: Medicare HMO

## 2019-03-14 ENCOUNTER — Inpatient Hospital Stay: Payer: Medicare HMO | Attending: Hematology and Oncology

## 2019-03-14 ENCOUNTER — Other Ambulatory Visit: Payer: Self-pay

## 2019-03-14 ENCOUNTER — Other Ambulatory Visit: Payer: Self-pay | Admitting: Internal Medicine

## 2019-03-14 VITALS — BP 134/67 | HR 68 | Temp 96.9°F | Resp 18

## 2019-03-14 DIAGNOSIS — N183 Chronic kidney disease, stage 3 unspecified: Secondary | ICD-10-CM | POA: Diagnosis present

## 2019-03-14 DIAGNOSIS — D7589 Other specified diseases of blood and blood-forming organs: Secondary | ICD-10-CM | POA: Insufficient documentation

## 2019-03-14 DIAGNOSIS — Z79899 Other long term (current) drug therapy: Secondary | ICD-10-CM | POA: Diagnosis not present

## 2019-03-14 DIAGNOSIS — E785 Hyperlipidemia, unspecified: Secondary | ICD-10-CM | POA: Diagnosis not present

## 2019-03-14 DIAGNOSIS — D631 Anemia in chronic kidney disease: Secondary | ICD-10-CM | POA: Diagnosis present

## 2019-03-14 DIAGNOSIS — Z95828 Presence of other vascular implants and grafts: Secondary | ICD-10-CM

## 2019-03-14 DIAGNOSIS — I129 Hypertensive chronic kidney disease with stage 1 through stage 4 chronic kidney disease, or unspecified chronic kidney disease: Secondary | ICD-10-CM | POA: Diagnosis not present

## 2019-03-14 DIAGNOSIS — N1832 Chronic kidney disease, stage 3b: Secondary | ICD-10-CM

## 2019-03-14 LAB — HEMOGLOBIN AND HEMATOCRIT, BLOOD
HCT: 29.5 % — ABNORMAL LOW (ref 36.0–46.0)
Hemoglobin: 9.3 g/dL — ABNORMAL LOW (ref 12.0–15.0)

## 2019-03-14 MED ORDER — EPOETIN ALFA-EPBX 10000 UNIT/ML IJ SOLN
10000.0000 [IU] | Freq: Once | INTRAMUSCULAR | Status: AC
  Administered 2019-03-14: 10000 [IU] via SUBCUTANEOUS

## 2019-03-14 MED ORDER — SODIUM CHLORIDE 0.9% FLUSH
10.0000 mL | INTRAVENOUS | Status: DC | PRN
  Administered 2019-03-14: 10 mL via INTRAVENOUS
  Filled 2019-03-14: qty 10

## 2019-03-14 MED ORDER — HEPARIN SOD (PORK) LOCK FLUSH 100 UNIT/ML IV SOLN
500.0000 [IU] | Freq: Once | INTRAVENOUS | Status: AC
  Administered 2019-03-14: 500 [IU] via INTRAVENOUS
  Filled 2019-03-14: qty 5

## 2019-03-14 MED ORDER — LIDOCAINE-PRILOCAINE 2.5-2.5 % EX CREA: 1.0000 "application " | TOPICAL_CREAM | CUTANEOUS | 3 refills | Status: DC | PRN

## 2019-03-14 NOTE — Telephone Encounter (Signed)
Pt follow up with Dr.Corcoran now.  GB

## 2019-03-14 NOTE — Telephone Encounter (Signed)
Patient is a Dr. Mike Gip patient now.

## 2019-03-15 ENCOUNTER — Telehealth: Payer: Self-pay

## 2019-03-15 NOTE — Telephone Encounter (Signed)
spoke with the patient to inform her that The Emla Cream has been approved today. The patient was understanding and agreeable.

## 2019-03-19 DIAGNOSIS — R600 Localized edema: Secondary | ICD-10-CM | POA: Insufficient documentation

## 2019-03-19 DIAGNOSIS — E875 Hyperkalemia: Secondary | ICD-10-CM | POA: Insufficient documentation

## 2019-03-27 ENCOUNTER — Telehealth (INDEPENDENT_AMBULATORY_CARE_PROVIDER_SITE_OTHER): Payer: Self-pay

## 2019-03-27 NOTE — Telephone Encounter (Signed)
No studies needed....dew/me

## 2019-03-28 ENCOUNTER — Inpatient Hospital Stay: Payer: Medicare HMO

## 2019-03-28 ENCOUNTER — Ambulatory Visit (INDEPENDENT_AMBULATORY_CARE_PROVIDER_SITE_OTHER): Payer: Medicare HMO | Admitting: Nurse Practitioner

## 2019-03-28 ENCOUNTER — Encounter (INDEPENDENT_AMBULATORY_CARE_PROVIDER_SITE_OTHER): Payer: Self-pay | Admitting: Nurse Practitioner

## 2019-03-28 ENCOUNTER — Other Ambulatory Visit: Payer: Self-pay

## 2019-03-28 VITALS — BP 124/55 | HR 67 | Temp 94.8°F | Resp 18

## 2019-03-28 VITALS — BP 147/71 | HR 80 | Resp 14 | Ht 64.0 in | Wt 262.0 lb

## 2019-03-28 DIAGNOSIS — I890 Lymphedema, not elsewhere classified: Secondary | ICD-10-CM

## 2019-03-28 DIAGNOSIS — N183 Chronic kidney disease, stage 3 unspecified: Secondary | ICD-10-CM

## 2019-03-28 DIAGNOSIS — I83029 Varicose veins of left lower extremity with ulcer of unspecified site: Secondary | ICD-10-CM | POA: Diagnosis not present

## 2019-03-28 DIAGNOSIS — L97929 Non-pressure chronic ulcer of unspecified part of left lower leg with unspecified severity: Secondary | ICD-10-CM

## 2019-03-28 DIAGNOSIS — D631 Anemia in chronic kidney disease: Secondary | ICD-10-CM

## 2019-03-28 DIAGNOSIS — I129 Hypertensive chronic kidney disease with stage 1 through stage 4 chronic kidney disease, or unspecified chronic kidney disease: Secondary | ICD-10-CM | POA: Diagnosis not present

## 2019-03-28 LAB — HEMOGLOBIN AND HEMATOCRIT, BLOOD
HCT: 29.8 % — ABNORMAL LOW (ref 36.0–46.0)
Hemoglobin: 9.4 g/dL — ABNORMAL LOW (ref 12.0–15.0)

## 2019-03-28 MED ORDER — EPOETIN ALFA-EPBX 10000 UNIT/ML IJ SOLN
10000.0000 [IU] | Freq: Once | INTRAMUSCULAR | Status: AC
  Administered 2019-03-28: 10000 [IU] via SUBCUTANEOUS

## 2019-03-28 MED ORDER — DOXYCYCLINE HYCLATE 100 MG PO CAPS: 100.0000 mg | ORAL_CAPSULE | Freq: Two times a day (BID) | ORAL | 0 refills | Status: DC

## 2019-03-28 NOTE — Patient Instructions (Signed)

## 2019-03-29 ENCOUNTER — Encounter (INDEPENDENT_AMBULATORY_CARE_PROVIDER_SITE_OTHER): Payer: Self-pay | Admitting: Nurse Practitioner

## 2019-03-29 NOTE — Progress Notes (Signed)
SUBJECTIVE:  Patient ID: Sara Bright, female    DOB: March 19, 1940, 79 y.o.   MRN: [id number] Chief Complaint  Patient presents with  . Follow-up    LLE Blisters/ ulcer    HPI  Sara Bright is a 79 y.o. female that presents today with swelling and blisters of her left lower extremity.  Previously the patient had venous ulcerations that were well healed.  The blisters appear to be more related to some sort of skin irritation, but the patient denies any new medications, creams or detergents.  The patient also has been diligent with conservative therapy, such as wearing medical grade one compression stockings, elevating and exercising.    Past Medical History:  Diagnosis Date  . Adenoma of colon   . Adenomatous colon polyp   . Anemia    IDA  . Cataract   . Cervical stenosis of spinal canal   . Chronic kidney disease    stage 3  . Diabetes mellitus without complication ([place])   . GERD (gastroesophageal reflux disease)   . Glaucoma   . Heme positive stool   . History of UTI   . Hx of gallstones   . Hyperkalemia   . Hyperlipidemia   . Hyperlipidemia   . Hypertension   . Lumbar stenosis   . Lymphedema   . Neuromuscular disorder ([place])   . Neuropathy associated with endocrine disorder ([place])   . Obesity   . OSA on CPAP    on CPAP  . Osteoarthritis   . Osteoarthritis   . Port-A-Cath in place   . Retinopathy due to secondary diabetes ([place])   . Secondary hyperparathyroidism of renal origin [facility])     Past Surgical History:  Procedure Laterality Date  . ACDF X2    . BACK SURGERY    . Cataract extraction right    . catarect extraction left    . CHOLECYSTECTOMY    . COLONOSCOPY WITH PROPOFOL N/A 12/02/2016   Procedure: COLONOSCOPY WITH PROPOFOL;  Surgeon: Manya Silvas, MD;  Location: [facility] ENDOSCOPY;  Service: Endoscopy;  Laterality: N/A;  . COLONOSCOPY WITH PROPOFOL N/A 11/10/2017   Procedure: COLONOSCOPY WITH PROPOFOL;  Surgeon: Toledo, Benay Pike, MD;  Location: [facility]  ENDOSCOPY;  Service: Gastroenterology;  Laterality: N/A;  . colonoscopy with removal lesions by snare    . Endoscoic carpal tunnel release    . ESOPHAGOGASTRODUODENOSCOPY N/A 11/10/2017   Procedure: ESOPHAGOGASTRODUODENOSCOPY (EGD);  Surgeon: Toledo, Benay Pike, MD;  Location: [facility] ENDOSCOPY;  Service: Gastroenterology;  Laterality: N/A;  . ESOPHAGOGASTRODUODENOSCOPY (EGD) WITH PROPOFOL N/A 12/02/2016   Procedure: ESOPHAGOGASTRODUODENOSCOPY (EGD) WITH PROPOFOL;  Surgeon: Manya Silvas, MD;  Location: [facility] ENDOSCOPY;  Service: Endoscopy;  Laterality: N/A;  . IR IMAGING GUIDED PORT INSERTION  10/01/2017  . Laminectomy posterior lumbar facetectomy and formaninotomy w/Decomp    . Laminectony posterior cervicle decomp w/Facectomy and foraminotomy    . VEIN LIGATION AND STRIPPING Left     Social History   Socioeconomic History  . Marital status: Widowed    Spouse name: Not on file  . Number of children: Not on file  . Years of education: Not on file  . Highest education level: Not on file  Occupational History  . Not on file  Tobacco Use  . Smoking status: Never Smoker  . Smokeless tobacco: Never Used  Substance and Sexual Activity  . Alcohol use: No  . Drug use: No  . Sexual activity: Not on file  Other Topics Concern  .  Not on file  Social History Narrative  . Not on file   Social Determinants of Health   Financial Resource Strain:   . Difficulty of Paying Living Expenses: Not on file  Food Insecurity:   . Worried About Charity fundraiser in the Last Year: Not on file  . Ran Out of Food in the Last Year: Not on file  Transportation Needs:   . Lack of Transportation (Medical): Not on file  . Lack of Transportation (Non-Medical): Not on file  Physical Activity:   . Days of Exercise per Week: Not on file  . Minutes of Exercise per Session: Not on file  Stress:   . Feeling of Stress : Not on file  Social Connections:   . Frequency of Communication with Friends and Family:  Not on file  . Frequency of Social Gatherings with Friends and Family: Not on file  . Attends Religious Services: Not on file  . Active Member of Clubs or Organizations: Not on file  . Attends Archivist Meetings: Not on file  . Marital Status: Not on file  Intimate Partner Violence:   . Fear of Current or Ex-Partner: Not on file  . Emotionally Abused: Not on file  . Physically Abused: Not on file  . Sexually Abused: Not on file    Family History  Problem Relation Age of Onset  . Coronary artery disease Mother   . Diabetes type II Mother   . Hypertension Mother   . Tuberculosis Father   . Stroke Father   . Diabetes type II Sister   . Migraines Sister   . Alcohol abuse Brother   . Coronary artery disease Brother   . Diabetes type II Brother   . Kidney cancer Brother   . Kidney disease Brother   . Heart attack Brother   . Hypertension Brother     Allergies  Allergen Reactions  . Hydrocodone-Acetaminophen Other (See Comments)    Sedation and GI upset, pt is not allergic to acetaminophen  . Statins Other (See Comments)  . Tramadol-Acetaminophen Nausea And Vomiting    GI upset, pt is not allergic to acetaminophen     Review of Systems   Review of Systems: Negative Unless Checked Constitutional: [] Weight loss  [] Fever  [] Chills Cardiac: [] Chest pain   []  Atrial Fibrillation  [] Palpitations   [] Shortness of breath when laying flat   [] Shortness of breath with exertion. [] Shortness of breath at rest Vascular:  [] Pain in legs with walking   [] Pain in legs with standing [] Pain in legs when laying flat   [x] Claudication    [] Pain in feet when laying flat    [] History of DVT   [] Phlebitis   [] Swelling in legs   [x] Varicose veins   [] Non-healing ulcers Pulmonary:   [] Uses home oxygen   [] Productive cough   [] Hemoptysis   [] Wheeze  [] COPD   [] Asthma Neurologic:  [] Dizziness   [] Seizures  [] Blackouts [] History of stroke   [] History of TIA  [] Aphasia   [] Temporary Blindness    [] Weakness or numbness in arm   [] Weakness or numbness in leg Musculoskeletal:   [] Joint swelling   [] Joint pain   [] Low back pain  []  History of Knee Replacement [] Arthritis [] back Surgeries  []  Spinal Stenosis    Hematologic:  [] Easy bruising  [] Easy bleeding   [] Hypercoagulable state   [] Anemic Gastrointestinal:  [] Diarrhea   [] Vomiting  [x] Gastroesophageal reflux/heartburn   [] Difficulty swallowing. [] Abdominal pain Genitourinary:  [] Chronic kidney disease   []   Difficult urination  [] Anuric   [] Blood in urine [] Frequent urination  [] Burning with urination   [] Hematuria Skin:  [] Rashes   [x] Ulcers [x] Wounds Psychological:  [] History of anxiety   []  History of major depression  []  Memory Difficulties      OBJECTIVE:   Physical Exam  BP (!) 147/71 (BP Location: Right Arm)   Pulse 80   Resp 14   Ht 5\' 4"  (1.626 m)   Wt 262 lb (118.8 kg)   BMI 44.97 kg/m   Gen: WD/WN, NAD Head: [place]/AT, No temporalis wasting.  Ear/Nose/Throat: Hearing grossly intact, nares w/o erythema or drainage Eyes: PER, EOMI, sclera nonicteric.  Neck: Supple, no masses.  No JVD.  Pulmonary:  Good air movement, no use of accessory muscles.  Cardiac: RRR Vascular: 2+ edema with blisters on LLE  Vessel Right Left  Dorsalis Pedis Palpable Palpable  Posterior Tibial Palpable Palpable   Gastrointestinal: soft, non-distended. No guarding/no peritoneal signs.  Musculoskeletal: M/S 5/5 throughout.  No deformity or atrophy.  Neurologic: Pain and light touch intact in extremities.  Symmetrical.  Speech is fluent. Motor exam as listed above. Psychiatric: Judgment intact, Mood & affect appropriate for pt's clinical situation. Dermatologic:stasis dermatitis, blisters .  No changes consistent with cellulitis. Lymph : No Cervical lymphadenopathy, dermal thickening       ASSESSMENT AND PLAN:  1. Venous ulcer of left leg (HCC) No surgery or intervention at this point in time.    I have had a long discussion with the  patient regarding venous insufficiency and why it  causes symptoms, specifically venous ulceration . I have discussed with the patient the chronic skin changes that accompany venous insufficiency and the long term sequela such as infection and recurring  ulceration.  Patient will be placed in [employer] which will be changed weekly drainage permitting.  In addition, behavioral modification including several periods of elevation of the lower extremities during the day will be continued. Achieving a position with the ankles at heart level was stressed to the patient  The patient is instructed to begin routine exercise, especially walking on a daily basis  Patient will follow up in 4 weeks for reevaluation   2. Lymphedema Patient will continue to do conservative therapy with right lower extremity.  This includes wearing her medical grade 1 compression stockings, elevating her lower extremity and exercising.     Current Outpatient Medications on File Prior to Visit  Medication Sig Dispense Refill  . acetaminophen (TYLENOL) 500 MG tablet Take 500 mg by mouth every 6 (six) hours as needed.     Marland Kitchen albuterol (PROVENTIL HFA;VENTOLIN HFA) 108 (90 Base) MCG/ACT inhaler Inhale 2 puffs into the lungs every 6 (six) hours as needed for wheezing or shortness of breath.    Marland Kitchen amLODipine (NORVASC) 5 MG tablet Take 10 mg by mouth daily.     Marland Kitchen aspirin EC 81 MG tablet Take 81 mg by mouth daily.    . ASSURE COMFORT LANCETS 30G MISC     . carvedilol (COREG) 6.25 MG tablet Take 6.25 mg by mouth 2 (two) times daily.  12  . ferrous sulfate 325 (65 FE) MG tablet Take 325 mg by mouth daily.    . furosemide (LASIX) 40 MG tablet Take 40 mg by mouth daily.     Marland Kitchen gabapentin (NEURONTIN) 100 MG capsule Take 100 mg by mouth 2 (two) times daily.     Marland Kitchen gemfibrozil (LOPID) 600 MG tablet Take 600 mg by mouth daily.     Marland Kitchen  gemfibrozil (LOPID) 600 MG tablet TAKE 1 TABLET BY MOUTH EVERY DAY    . hydrALAZINE (APRESOLINE) 50 MG tablet  Take 25 mg by mouth 3 (three) times daily.   12  . Lancet Devices (ADJUSTABLE LANCING DEVICE) MISC     . latanoprost (XALATAN) 0.005 % ophthalmic solution Place 1 drop into both eyes.     Marland Kitchen lidocaine-prilocaine (EMLA) cream Apply 1 application topically as needed. 30 g 3  . losartan (COZAAR) 50 MG tablet Take 100 mg by mouth daily.     . Multiple Vitamins-Minerals (CENTRUM SILVER) tablet Take 1 tablet by mouth daily.     . Omega-3 Fatty Acids (FISH OIL) 1000 MG CAPS Take 1,000 mg by mouth daily.     Marland Kitchen omeprazole (PRILOSEC) 20 MG capsule Take 20 mg by mouth daily.     Marland Kitchen omeprazole (PRILOSEC) 20 MG capsule TAKE 1 CAPSULE BY MOUTH EVERY DAY    . ONE TOUCH ULTRA TEST test strip     . pioglitazone (ACTOS) 30 MG tablet Take 30 mg by mouth daily.     . calcitRIOL (ROCALTROL) 0.25 MCG capsule Take 0.5 mcg by mouth 3 (three) times a week.     Marland Kitchen glimepiride (AMARYL) 1 MG tablet Take 0.5 mg by mouth daily with breakfast.      No current facility-administered medications on file prior to visit.    There are no Patient Instructions on file for this visit. No follow-ups on file.   Kris Hartmann, NP  This note was completed with Sales executive.  Any errors are purely unintentional.

## 2019-04-04 ENCOUNTER — Ambulatory Visit (INDEPENDENT_AMBULATORY_CARE_PROVIDER_SITE_OTHER): Payer: Medicare HMO | Admitting: Nurse Practitioner

## 2019-04-04 ENCOUNTER — Other Ambulatory Visit: Payer: Self-pay

## 2019-04-04 ENCOUNTER — Encounter (INDEPENDENT_AMBULATORY_CARE_PROVIDER_SITE_OTHER): Payer: Self-pay

## 2019-04-04 VITALS — BP 154/67 | HR 74 | Resp 16 | Wt 238.6 lb

## 2019-04-04 DIAGNOSIS — L97929 Non-pressure chronic ulcer of unspecified part of left lower leg with unspecified severity: Secondary | ICD-10-CM | POA: Diagnosis not present

## 2019-04-04 DIAGNOSIS — I83029 Varicose veins of left lower extremity with ulcer of unspecified site: Secondary | ICD-10-CM | POA: Diagnosis not present

## 2019-04-04 NOTE — Progress Notes (Signed)
History of Present Illness  There is no documented history at this time  Assessments & Plan   There are no diagnoses linked to this encounter.    Additional instructions  Subjective:  Patient presents with venous ulcer of the Left lower extremity.    Procedure:  3 layer unna wrap was placed Left lower extremity.   Plan:   Follow up in one week.  

## 2019-04-05 ENCOUNTER — Encounter (INDEPENDENT_AMBULATORY_CARE_PROVIDER_SITE_OTHER): Payer: Self-pay | Admitting: Nurse Practitioner

## 2019-04-11 ENCOUNTER — Encounter (INDEPENDENT_AMBULATORY_CARE_PROVIDER_SITE_OTHER): Payer: Self-pay

## 2019-04-11 ENCOUNTER — Other Ambulatory Visit: Payer: Self-pay

## 2019-04-11 ENCOUNTER — Ambulatory Visit (INDEPENDENT_AMBULATORY_CARE_PROVIDER_SITE_OTHER): Payer: Medicare HMO | Admitting: Nurse Practitioner

## 2019-04-11 ENCOUNTER — Inpatient Hospital Stay: Payer: Medicare HMO

## 2019-04-11 ENCOUNTER — Inpatient Hospital Stay: Payer: Medicare HMO | Attending: Hematology and Oncology

## 2019-04-11 VITALS — BP 147/69 | HR 72 | Resp 14 | Wt 259.0 lb

## 2019-04-11 VITALS — BP 141/56 | HR 66 | Temp 97.4°F | Resp 20

## 2019-04-11 DIAGNOSIS — Z836 Family history of other diseases of the respiratory system: Secondary | ICD-10-CM | POA: Diagnosis not present

## 2019-04-11 DIAGNOSIS — R911 Solitary pulmonary nodule: Secondary | ICD-10-CM | POA: Diagnosis not present

## 2019-04-11 DIAGNOSIS — E785 Hyperlipidemia, unspecified: Secondary | ICD-10-CM | POA: Diagnosis not present

## 2019-04-11 DIAGNOSIS — Z811 Family history of alcohol abuse and dependence: Secondary | ICD-10-CM | POA: Diagnosis not present

## 2019-04-11 DIAGNOSIS — N289 Disorder of kidney and ureter, unspecified: Secondary | ICD-10-CM | POA: Insufficient documentation

## 2019-04-11 DIAGNOSIS — Z79899 Other long term (current) drug therapy: Secondary | ICD-10-CM | POA: Diagnosis not present

## 2019-04-11 DIAGNOSIS — Z8249 Family history of ischemic heart disease and other diseases of the circulatory system: Secondary | ICD-10-CM | POA: Diagnosis not present

## 2019-04-11 DIAGNOSIS — I890 Lymphedema, not elsewhere classified: Secondary | ICD-10-CM

## 2019-04-11 DIAGNOSIS — Z833 Family history of diabetes mellitus: Secondary | ICD-10-CM | POA: Diagnosis not present

## 2019-04-11 DIAGNOSIS — K5730 Diverticulosis of large intestine without perforation or abscess without bleeding: Secondary | ICD-10-CM | POA: Insufficient documentation

## 2019-04-11 DIAGNOSIS — Z8051 Family history of malignant neoplasm of kidney: Secondary | ICD-10-CM | POA: Insufficient documentation

## 2019-04-11 DIAGNOSIS — R634 Abnormal weight loss: Secondary | ICD-10-CM | POA: Insufficient documentation

## 2019-04-11 DIAGNOSIS — R062 Wheezing: Secondary | ICD-10-CM | POA: Diagnosis not present

## 2019-04-11 DIAGNOSIS — I129 Hypertensive chronic kidney disease with stage 1 through stage 4 chronic kidney disease, or unspecified chronic kidney disease: Secondary | ICD-10-CM | POA: Insufficient documentation

## 2019-04-11 DIAGNOSIS — Z885 Allergy status to narcotic agent status: Secondary | ICD-10-CM | POA: Insufficient documentation

## 2019-04-11 DIAGNOSIS — R0602 Shortness of breath: Secondary | ICD-10-CM | POA: Diagnosis not present

## 2019-04-11 DIAGNOSIS — Z841 Family history of disorders of kidney and ureter: Secondary | ICD-10-CM | POA: Insufficient documentation

## 2019-04-11 DIAGNOSIS — D631 Anemia in chronic kidney disease: Secondary | ICD-10-CM | POA: Diagnosis present

## 2019-04-11 DIAGNOSIS — N183 Chronic kidney disease, stage 3 unspecified: Secondary | ICD-10-CM | POA: Diagnosis present

## 2019-04-11 DIAGNOSIS — K219 Gastro-esophageal reflux disease without esophagitis: Secondary | ICD-10-CM | POA: Diagnosis not present

## 2019-04-11 DIAGNOSIS — D7589 Other specified diseases of blood and blood-forming organs: Secondary | ICD-10-CM | POA: Insufficient documentation

## 2019-04-11 DIAGNOSIS — Z7984 Long term (current) use of oral hypoglycemic drugs: Secondary | ICD-10-CM | POA: Insufficient documentation

## 2019-04-11 DIAGNOSIS — Z823 Family history of stroke: Secondary | ICD-10-CM | POA: Diagnosis not present

## 2019-04-11 DIAGNOSIS — Z886 Allergy status to analgesic agent status: Secondary | ICD-10-CM | POA: Diagnosis not present

## 2019-04-11 DIAGNOSIS — E1122 Type 2 diabetes mellitus with diabetic chronic kidney disease: Secondary | ICD-10-CM | POA: Diagnosis not present

## 2019-04-11 DIAGNOSIS — M1990 Unspecified osteoarthritis, unspecified site: Secondary | ICD-10-CM | POA: Insufficient documentation

## 2019-04-11 DIAGNOSIS — Z888 Allergy status to other drugs, medicaments and biological substances status: Secondary | ICD-10-CM | POA: Diagnosis not present

## 2019-04-11 LAB — HEMOGLOBIN AND HEMATOCRIT, BLOOD
HCT: 28.2 % — ABNORMAL LOW (ref 36.0–46.0)
Hemoglobin: 8.9 g/dL — ABNORMAL LOW (ref 12.0–15.0)

## 2019-04-11 MED ORDER — EPOETIN ALFA-EPBX 10000 UNIT/ML IJ SOLN
10000.0000 [IU] | Freq: Once | INTRAMUSCULAR | Status: AC
  Administered 2019-04-11: 11:00:00 10000 [IU] via SUBCUTANEOUS

## 2019-04-11 NOTE — Progress Notes (Signed)
History of Present Illness  There is no documented history at this time  Assessments & Plan   There are no diagnoses linked to this encounter.    Additional instructions  Subjective:  Patient presents with venous ulcer of the Left lower extremity.    Procedure:  3 layer unna wrap was placed Left lower extremity.   Plan:   Follow up in one week.  

## 2019-04-12 ENCOUNTER — Encounter (INDEPENDENT_AMBULATORY_CARE_PROVIDER_SITE_OTHER): Payer: Self-pay | Admitting: Nurse Practitioner

## 2019-04-18 ENCOUNTER — Encounter (INDEPENDENT_AMBULATORY_CARE_PROVIDER_SITE_OTHER): Payer: Self-pay

## 2019-04-18 ENCOUNTER — Ambulatory Visit (INDEPENDENT_AMBULATORY_CARE_PROVIDER_SITE_OTHER): Payer: Medicare HMO | Admitting: Nurse Practitioner

## 2019-04-18 ENCOUNTER — Other Ambulatory Visit: Payer: Self-pay

## 2019-04-18 VITALS — BP 155/64 | HR 75 | Resp 16 | Wt 260.4 lb

## 2019-04-18 DIAGNOSIS — I890 Lymphedema, not elsewhere classified: Secondary | ICD-10-CM | POA: Diagnosis not present

## 2019-04-18 NOTE — Progress Notes (Signed)
History of Present Illness  There is no documented history at this time  Assessments & Plan   There are no diagnoses linked to this encounter.    Additional instructions  Subjective:  Patient presents with venous ulcer of the Left lower extremity.    Procedure:  3 layer unna wrap was placed Left lower extremity.   Plan:   Follow up in one week.  

## 2019-04-22 NOTE — Progress Notes (Signed)
[facility]  [address], [address] [place], [place] [address] Phone: [phone]  Fax: [phone]   Clinic Day:  04/25/2019  Referring physician: Sallee Lange, *  Chief Complaint: Sara Bright is a 79 y.o. female with anemia of chronic kidney disease who is seen for a 3 month assessment.   HPI: The patient was last seen in the hematology clinic on 01/31/2019. At that time, she was doing well.  Exam was unremarkable. Hematocrit 30.8, hemoglobin 9.6, MCV 102.3, platelets 145,000, WBC 3,700. Ferritin was 191. Creatinine was 1.86. Patient was given Retacrit.   Patient received Retacrit every 2 weeks (02/14/2019 - 04/11/2019).  Hemoglobin has ranged between 8.9 - 9.4.  Patient was seen by Dr. Raul Del on 02/23/2019. She was using her CPAP machine nightly. She noted trouble losing weight. She had no other complaints. Her wheezing improved with albuterol.   CXR on 02/23/2019 at [facility] showed no trace pericardial effusion. There was no evidence of interstitial lung disease. There was small right lower lobe nodule. No follow up needed if patient was low-risk. Non-contrast chest CT was considered in 12 months if patient was high-risk.   She was seen by Dr. Holley Raring on 03/20/2019. Exam was stable. She remained on current medications.   She was seen by Eulogio Ditch, NP in vascular surgery on 03/28/2019. Patient had swelling and blisters on the left lower extremity. Blisters appeared to be more related to some sort of skin irritation. Patient denied any new medications, creams or detergents. The patient had been diligent with conservative therapy, such as wearing medical grade one compression stockings, elevating and exercising.  No surgery or intervention was recommended at that time. Patient was placed in [place] boots and advised to exercise by walking daily.   During the interim, she has been doing "just fine".  She is tolerating Retacrit well.  Her energy level  fluctuates. Her lower extremities are wrapped and dressings changed every 2 weeks. Her skin is healing. She would like to lose weight. She is currently not active because of the pandemic restrictions. She is trying to change her eating habits.   She reports Dr. Raul Del is monitoring the small nodule found in the right lower lobe on CXR on 02/23/2019. She has shortness of breath on exertion. She continues to use her inhaler. Her BP is 158/51 in the clinic today.   She received her first COVID-19 vaccine on 04/19/2019.  She will receive the second dose on 05/08/2019.    Past Medical History:  Diagnosis Date  . Adenoma of colon   . Adenomatous colon polyp   . Anemia    IDA  . Cataract   . Cervical stenosis of spinal canal   . Chronic kidney disease    stage 3  . Diabetes mellitus without complication ([place])   . GERD (gastroesophageal reflux disease)   . Glaucoma   . Heme positive stool   . History of UTI   . Hx of gallstones   . Hyperkalemia   . Hyperlipidemia   . Hyperlipidemia   . Hypertension   . Lumbar stenosis   . Lymphedema   . Neuromuscular disorder ([place])   . Neuropathy associated with endocrine disorder ([place])   . Obesity   . OSA on CPAP    on CPAP  . Osteoarthritis   . Osteoarthritis   . Port-A-Cath in place   . Retinopathy due to secondary diabetes ([place])   . Secondary hyperparathyroidism of renal origin ([place])  Past Surgical History:  Procedure Laterality Date  . ACDF X2    . BACK SURGERY    . Cataract extraction right    . catarect extraction left    . CHOLECYSTECTOMY    . COLONOSCOPY WITH PROPOFOL N/A 12/02/2016   Procedure: COLONOSCOPY WITH PROPOFOL;  Surgeon: Manya Silvas, MD;  Location: [facility] ENDOSCOPY;  Service: Endoscopy;  Laterality: N/A;  . COLONOSCOPY WITH PROPOFOL N/A 11/10/2017   Procedure: COLONOSCOPY WITH PROPOFOL;  Surgeon: Toledo, Benay Pike, MD;  Location: [facility] ENDOSCOPY;  Service: Gastroenterology;  Laterality: N/A;  . colonoscopy with  removal lesions by snare    . Endoscoic carpal tunnel release    . ESOPHAGOGASTRODUODENOSCOPY N/A 11/10/2017   Procedure: ESOPHAGOGASTRODUODENOSCOPY (EGD);  Surgeon: Toledo, Benay Pike, MD;  Location: [facility] ENDOSCOPY;  Service: Gastroenterology;  Laterality: N/A;  . ESOPHAGOGASTRODUODENOSCOPY (EGD) WITH PROPOFOL N/A 12/02/2016   Procedure: ESOPHAGOGASTRODUODENOSCOPY (EGD) WITH PROPOFOL;  Surgeon: Manya Silvas, MD;  Location: [facility] ENDOSCOPY;  Service: Endoscopy;  Laterality: N/A;  . IR IMAGING GUIDED PORT INSERTION  10/01/2017  . Laminectomy posterior lumbar facetectomy and formaninotomy w/Decomp    . Laminectony posterior cervicle decomp w/Facectomy and foraminotomy    . VEIN LIGATION AND STRIPPING Left     Family History  Problem Relation Age of Onset  . Coronary artery disease Mother   . Diabetes type II Mother   . Hypertension Mother   . Tuberculosis Father   . Stroke Father   . Diabetes type II Sister   . Migraines Sister   . Alcohol abuse Brother   . Coronary artery disease Brother   . Diabetes type II Brother   . Kidney cancer Brother   . Kidney disease Brother   . Heart attack Brother   . Hypertension Brother     Social History:  reports that she has never smoked. She has never used smokeless tobacco. She reports that she does not drink alcohol or use drugs. She lives in [place]. The patient is alone today.  Allergies:  Allergies  Allergen Reactions  . Aspirin     Other reaction(s): Other (see comments)  . Hydrocodone-Acetaminophen Other (See Comments)    Sedation and GI upset, pt is not allergic to acetaminophen  . Statins Other (See Comments)  . Tramadol-Acetaminophen Nausea And Vomiting    GI upset, pt is not allergic to acetaminophen    Current Medications: Current Outpatient Medications  Medication Sig Dispense Refill  . acetaminophen (TYLENOL) 500 MG tablet Take 500 mg by mouth every 6 (six) hours as needed.     Marland Kitchen albuterol (PROVENTIL HFA;VENTOLIN HFA) 108  (90 Base) MCG/ACT inhaler Inhale 2 puffs into the lungs every 6 (six) hours as needed for wheezing or shortness of breath.    Marland Kitchen amLODipine (NORVASC) 5 MG tablet Take 10 mg by mouth daily.     Marland Kitchen aspirin EC 81 MG tablet Take 81 mg by mouth daily.    . ASSURE COMFORT LANCETS 30G MISC     . calcitRIOL (ROCALTROL) 0.25 MCG capsule Take 0.5 mcg by mouth 3 (three) times a week.     . carvedilol (COREG) 6.25 MG tablet Take 6.25 mg by mouth 2 (two) times daily.  12  . doxycycline (VIBRAMYCIN) 100 MG capsule Take 1 capsule (100 mg total) by mouth 2 (two) times daily. 20 capsule 0  . ferrous sulfate 325 (65 FE) MG tablet Take 325 mg by mouth daily.    . furosemide (LASIX) 40 MG tablet Take 40 mg by  mouth daily.     Marland Kitchen gabapentin (NEURONTIN) 100 MG capsule Take 100 mg by mouth 2 (two) times daily.     Marland Kitchen gemfibrozil (LOPID) 600 MG tablet Take 600 mg by mouth daily.     Marland Kitchen gemfibrozil (LOPID) 600 MG tablet TAKE 1 TABLET BY MOUTH EVERY DAY    . glimepiride (AMARYL) 1 MG tablet Take 0.5 mg by mouth daily with breakfast.     . hydrALAZINE (APRESOLINE) 50 MG tablet Take 25 mg by mouth 3 (three) times daily.   12  . Lancet Devices (ADJUSTABLE LANCING DEVICE) MISC     . latanoprost (XALATAN) 0.005 % ophthalmic solution Place 1 drop into both eyes.     Marland Kitchen lidocaine-prilocaine (EMLA) cream Apply 1 application topically as needed. 30 g 3  . losartan (COZAAR) 50 MG tablet Take 100 mg by mouth daily.     . Multiple Vitamins-Minerals (CENTRUM SILVER) tablet Take 1 tablet by mouth daily.     . Omega-3 Fatty Acids (FISH OIL) 1000 MG CAPS Take 1,000 mg by mouth daily.     Marland Kitchen omeprazole (PRILOSEC) 20 MG capsule Take 20 mg by mouth daily.     Marland Kitchen omeprazole (PRILOSEC) 20 MG capsule TAKE 1 CAPSULE BY MOUTH EVERY DAY    . ONE TOUCH ULTRA TEST test strip     . pioglitazone (ACTOS) 30 MG tablet Take 30 mg by mouth daily.      No current facility-administered medications for this visit.    Review of Systems  Constitutional:  Negative for chills, diaphoresis, fever, malaise/fatigue and weight loss (up 5 lbs since 01/31/2019).       Doing "just fine".  Energy fluctuates.   HENT: Negative.  Negative for congestion, ear pain, hearing loss, nosebleeds, sinus pain and sore throat.   Eyes: Negative.  Negative for blurred vision and double vision.  Respiratory: Positive for shortness of breath (on exertion). Negative for cough, hemoptysis, sputum production and wheezing (uses inhaler).   Cardiovascular: Negative.  Negative for chest pain, palpitations and leg swelling.  Gastrointestinal: Negative.  Negative for abdominal pain, blood in stool, constipation, diarrhea, heartburn, melena, nausea and vomiting.  Genitourinary: Negative.  Negative for dysuria, frequency and urgency.  Musculoskeletal: Negative.  Negative for back pain, joint pain and myalgias.  Skin: Negative.   Neurological: Negative.  Negative for dizziness, tingling, sensory change, speech change, focal weakness and weakness.  Endo/Heme/Allergies: Does not bruise/bleed easily.       Diabetes, under good control.  No thyroid issues.  Psychiatric/Behavioral: Negative.  Negative for depression and memory loss. The patient is not nervous/anxious and does not have insomnia.   All other systems reviewed and are negative.  Performance status (ECOG):  1  Vitals Blood pressure (!) 158/51, pulse 75, temperature (!) 96.1 F (35.6 C), temperature source Tympanic, resp. rate 19, weight 260 lb 2.3 oz (118 kg), SpO2 100 %.   Physical Exam  Constitutional: She is oriented to person, place, and time. She appears well-developed and well-nourished. No distress.  She has a cane by her side.  She needs some assistance onto exam room table.  HENT:  Head: Normocephalic and atraumatic.  Mouth/Throat: Oropharynx is clear and moist. No oropharyngeal exudate.  Short dark brown styled wig.  Mask.  Eyes: Pupils are equal, round, and reactive to light. Conjunctivae and EOM are  normal. No scleral icterus.  Glasses.  Brown eyes.  Neck: No JVD present.  Cardiovascular: Normal rate, regular rhythm and normal heart sounds. Exam reveals  no gallop.  No murmur heard. Pulmonary/Chest: Effort normal and breath sounds normal. No respiratory distress. She has no wheezes. She has no rales.  Abdominal: Soft. Bowel sounds are normal. She exhibits no distension and no mass. There is no abdominal tenderness. There is no rebound and no guarding.  Musculoskeletal:        General: No tenderness or edema. Normal range of motion.     Cervical back: Normal range of motion and neck supple.  Lymphadenopathy:       Head (right side): No preauricular, no posterior auricular and no occipital adenopathy present.       Head (left side): No preauricular, no posterior auricular and no occipital adenopathy present.    She has no cervical adenopathy.    She has no axillary adenopathy.       Right: No inguinal and no supraclavicular adenopathy present.       Left: No inguinal and no supraclavicular adenopathy present.  Neurological: She is alert and oriented to person, place, and time. She has normal reflexes.  Skin: Skin is warm and dry. No rash noted. She is not diaphoretic. No erythema. No pallor.  Psychiatric: She has a normal mood and affect. Her behavior is normal. Judgment and thought content normal.  Nursing note and vitals reviewed.   Appointment on 04/25/2019  Component Date Value Ref Range Status  . WBC 04/25/2019 4.5  4.0 - 10.5 K/uL Final  . RBC 04/25/2019 2.93* 3.87 - 5.11 MIL/uL Final  . Hemoglobin 04/25/2019 9.4* 12.0 - 15.0 g/dL Final  . HCT 04/25/2019 30.0* 36.0 - 46.0 % Final  . MCV 04/25/2019 102.4* 80.0 - 100.0 fL Final  . MCH 04/25/2019 32.1  26.0 - 34.0 pg Final  . MCHC 04/25/2019 31.3  30.0 - 36.0 g/dL Final  . RDW 04/25/2019 14.6  11.5 - 15.5 % Final  . Platelets 04/25/2019 165  150 - 400 K/uL Final  . nRBC 04/25/2019 0.0  0.0 - 0.2 % Final  . Neutrophils Relative  % 04/25/2019 54  % Final  . Neutro Abs 04/25/2019 2.4  1.7 - 7.7 K/uL Final  . Lymphocytes Relative 04/25/2019 29  % Final  . Lymphs Abs 04/25/2019 1.3  0.7 - 4.0 K/uL Final  . Monocytes Relative 04/25/2019 12  % Final  . Monocytes Absolute 04/25/2019 0.5  0.1 - 1.0 K/uL Final  . Eosinophils Relative 04/25/2019 4  % Final  . Eosinophils Absolute 04/25/2019 0.2  0.0 - 0.5 K/uL Final  . Basophils Relative 04/25/2019 1  % Final  . Basophils Absolute 04/25/2019 0.1  0.0 - 0.1 K/uL Final  . Immature Granulocytes 04/25/2019 0  % Final  . Abs Immature Granulocytes 04/25/2019 0.02  0.00 - 0.07 K/uL Final   Performed at [facility], [address] [place], [place] [address]    Assessment:  Sara Bright is a 79 y.o. female withanemia of chronic kidney disease.She has stage III chronic kidney disease. SPEP and free light chain ratio was normal on 11/12/2015.  She receivedAranesp 200 mcg on 09/02/2016, 12/22/2016, 03/23/2017, 07/20/2017, 08/17/2017, and 09/15/2017. She received Aranesp 300 mcgon 10/06/2017, 10/19/2017, 01/04/2018, 03/08/2018,and03/05/2018.She began Retacrit10,000 units on 07/04/2018 (last 01/17/2019).  She received 1 unit of PRBCson 09/22/2017. She received Venoferx 4 (10/22/2015 - 11/05/2015), x4 (01/21/2016 - 02/11/2016), 11/24/2016, 06/22/2017, 09/14/2017, 10/01/2017, 10/12/2017, 10/26/2017, 11/16/2017, and 03/01/2018.  Ferritinhas been followed: 63 on 10/08/2015, 290 on 01/14/2016, 651 on 05/12/2016, 467 on 09/01/2016, 413 on 11/19/2016, 633 on 12/22/2016, 374 on  01/19/2017, 480 on 06/22/2017, 135 on 09/22/2017, 159 on 10/06/2017, 373 on 03/01/2018, 289 on 03/29/2018, 257 on 07/04/2018, 217 on 08/30/2018, 178 on 11/08/2018, 191 on 01/31/2019, and 145 on 04/25/2019.   EGDon 09/04/2019revealed an irregularZ-line irregular, at the gastroesophageal junction, normal stomach, and normal duodenum.Colonoscopyon 11/10/2017 revealed diverticulosis  in the sigmoid colonand non-bleeding internal hemorrhoids.  She has macrocytic RBC indices. B12, folate, and TSH were normal on 04/26/2018.  She received her first COVID-19 vaccine on 04/19/2019.   Symptomatically, she feels "fine".  She has shortness of breath on exertion.  Plan: 1.   Labs today: CBC with diff, BMP, ferritin. 2.Anemia of chronic renal disease Hematocrit30.0. Hemoglobin9.4. [phone].   Maintain ferritin >100. Retacrit today. 3.Macrocytosis MCV 102.4. She has had macrocytic RBC indices since07/26/2019. Shehas no known liver disease. B12 was771,folate29,andTSH 4.290 on 04/26/2018. Continue to monitor. 4.Renal insufficiency Creatinine1.72(CrCl 32.9 ml/min). She is followed by Dr. Holley Raring. 5.RTC every 2 weeks x 6 for labs (HCT/Hgb) and +/- Retacrit.   6.   Next lab draw add B12, folate and TSH. 7.   RTC in 12 weeks for MD assessment, labs (CBC with diff, BMP, ferritin) and +/- Retacrit.   I discussed the assessment and treatment plan with the patient.  The patient was provided an opportunity to ask questions and all were answered.  The patient agreed with the plan and demonstrated an understanding of the instructions.  The patient was advised to call back if the symptoms worsen or if the condition fails to improve as anticipated.   Lequita Asal, MD, PhD    04/25/2019, 10:51 AM  I, Selena Batten, am acting as scribe for [employer]. Mike Gip, MD, PhD.  I, Jaxsin Bottomley C. Mike Gip, MD, have reviewed the above documentation for accuracy and completeness, and I agree with the above.

## 2019-04-24 ENCOUNTER — Other Ambulatory Visit: Payer: Self-pay

## 2019-04-24 ENCOUNTER — Encounter: Payer: Self-pay | Admitting: Hematology and Oncology

## 2019-04-24 NOTE — Progress Notes (Signed)
No new changes noted today. The patient Name and DOB has been verified by phone today. 

## 2019-04-25 ENCOUNTER — Inpatient Hospital Stay (HOSPITAL_BASED_OUTPATIENT_CLINIC_OR_DEPARTMENT_OTHER): Payer: Medicare HMO | Admitting: Hematology and Oncology

## 2019-04-25 ENCOUNTER — Encounter (INDEPENDENT_AMBULATORY_CARE_PROVIDER_SITE_OTHER): Payer: Self-pay | Admitting: Nurse Practitioner

## 2019-04-25 ENCOUNTER — Encounter: Payer: Self-pay | Admitting: Hematology and Oncology

## 2019-04-25 ENCOUNTER — Ambulatory Visit (INDEPENDENT_AMBULATORY_CARE_PROVIDER_SITE_OTHER): Payer: Medicare HMO | Admitting: Nurse Practitioner

## 2019-04-25 ENCOUNTER — Inpatient Hospital Stay: Payer: Medicare HMO

## 2019-04-25 VITALS — BP 158/51 | HR 75 | Temp 96.1°F | Resp 19 | Wt 260.1 lb

## 2019-04-25 VITALS — BP 130/69 | HR 80 | Resp 18 | Ht 64.0 in | Wt 260.0 lb

## 2019-04-25 DIAGNOSIS — D631 Anemia in chronic kidney disease: Secondary | ICD-10-CM

## 2019-04-25 DIAGNOSIS — N1832 Chronic kidney disease, stage 3b: Secondary | ICD-10-CM | POA: Diagnosis not present

## 2019-04-25 DIAGNOSIS — I83029 Varicose veins of left lower extremity with ulcer of unspecified site: Secondary | ICD-10-CM | POA: Diagnosis not present

## 2019-04-25 DIAGNOSIS — I10 Essential (primary) hypertension: Secondary | ICD-10-CM | POA: Diagnosis not present

## 2019-04-25 DIAGNOSIS — L97929 Non-pressure chronic ulcer of unspecified part of left lower leg with unspecified severity: Secondary | ICD-10-CM | POA: Diagnosis not present

## 2019-04-25 DIAGNOSIS — N183 Chronic kidney disease, stage 3 unspecified: Secondary | ICD-10-CM | POA: Diagnosis not present

## 2019-04-25 DIAGNOSIS — D7589 Other specified diseases of blood and blood-forming organs: Secondary | ICD-10-CM

## 2019-04-25 DIAGNOSIS — I129 Hypertensive chronic kidney disease with stage 1 through stage 4 chronic kidney disease, or unspecified chronic kidney disease: Secondary | ICD-10-CM | POA: Diagnosis not present

## 2019-04-25 DIAGNOSIS — I890 Lymphedema, not elsewhere classified: Secondary | ICD-10-CM

## 2019-04-25 LAB — CBC WITH DIFFERENTIAL/PLATELET
Abs Immature Granulocytes: 0.02 10*3/uL (ref 0.00–0.07)
Basophils Absolute: 0.1 10*3/uL (ref 0.0–0.1)
Basophils Relative: 1 %
Eosinophils Absolute: 0.2 10*3/uL (ref 0.0–0.5)
Eosinophils Relative: 4 %
HCT: 30 % — ABNORMAL LOW (ref 36.0–46.0)
Hemoglobin: 9.4 g/dL — ABNORMAL LOW (ref 12.0–15.0)
Immature Granulocytes: 0 %
Lymphocytes Relative: 29 %
Lymphs Abs: 1.3 10*3/uL (ref 0.7–4.0)
MCH: 32.1 pg (ref 26.0–34.0)
MCHC: 31.3 g/dL (ref 30.0–36.0)
MCV: 102.4 fL — ABNORMAL HIGH (ref 80.0–100.0)
Monocytes Absolute: 0.5 10*3/uL (ref 0.1–1.0)
Monocytes Relative: 12 %
Neutro Abs: 2.4 10*3/uL (ref 1.7–7.7)
Neutrophils Relative %: 54 %
Platelets: 165 10*3/uL (ref 150–400)
RBC: 2.93 MIL/uL — ABNORMAL LOW (ref 3.87–5.11)
RDW: 14.6 % (ref 11.5–15.5)
WBC: 4.5 10*3/uL (ref 4.0–10.5)
nRBC: 0 % (ref 0.0–0.2)

## 2019-04-25 LAB — BASIC METABOLIC PANEL
Anion gap: 9 (ref 5–15)
BUN: 44 mg/dL — ABNORMAL HIGH (ref 8–23)
CO2: 23 mmol/L (ref 22–32)
Calcium: 10 mg/dL (ref 8.9–10.3)
Chloride: 104 mmol/L (ref 98–111)
Creatinine, Ser: 1.72 mg/dL — ABNORMAL HIGH (ref 0.44–1.00)
GFR calc Af Amer: 32 mL/min — ABNORMAL LOW (ref >60)
GFR calc non Af Amer: 28 mL/min — ABNORMAL LOW (ref >60)
Glucose, Bld: 157 mg/dL — ABNORMAL HIGH (ref 70–99)
Potassium: 4.4 mmol/L (ref 3.5–5.1)
Sodium: 136 mmol/L (ref 135–145)

## 2019-04-25 LAB — FERRITIN: Ferritin: 145 ng/mL (ref 11–307)

## 2019-04-25 MED ORDER — EPOETIN ALFA-EPBX 10000 UNIT/ML IJ SOLN
10000.0000 [IU] | Freq: Once | INTRAMUSCULAR | Status: AC
  Administered 2019-04-25: 10000 [IU] via SUBCUTANEOUS

## 2019-04-25 NOTE — Patient Instructions (Signed)

## 2019-04-25 NOTE — Progress Notes (Signed)
SUBJECTIVE:  Patient ID: Sara Bright, female    DOB: 08-15-1940, 79 y.o.   MRN: [id number] Chief Complaint  Patient presents with  . Follow-up    LLE unna check     HPI  Sara Bright is a 79 y.o. female that presents today for evaluation of lower extremity ulceration.  The patient has had ulcerations previously on her left lower extremity.  At this time there are more so like blisters.  Today, the areas appear much better than they were previously.  The patient also reports that when the wrap was removed this time it was much less painful than previous.  However, there is still some open area evident.  The patient has continued with her conservative therapy such as elevation, exercise and use of her lymphedema pump.  She denies any fever, chills, nausea, vomiting or diarrhea.  She has been tolerating the interacts well.  Past Medical History:  Diagnosis Date  . Adenoma of colon   . Adenomatous colon polyp   . Anemia    IDA  . Cataract   . Cervical stenosis of spinal canal   . Chronic kidney disease    stage 3  . Diabetes mellitus without complication ([place])   . GERD (gastroesophageal reflux disease)   . Glaucoma   . Heme positive stool   . History of UTI   . Hx of gallstones   . Hyperkalemia   . Hyperlipidemia   . Hyperlipidemia   . Hypertension   . Lumbar stenosis   . Lymphedema   . Neuromuscular disorder ([place])   . Neuropathy associated with endocrine disorder ([place])   . Obesity   . OSA on CPAP    on CPAP  . Osteoarthritis   . Osteoarthritis   . Port-A-Cath in place   . Retinopathy due to secondary diabetes ([place])   . Secondary hyperparathyroidism of renal origin [facility])     Past Surgical History:  Procedure Laterality Date  . ACDF X2    . BACK SURGERY    . Cataract extraction right    . catarect extraction left    . CHOLECYSTECTOMY    . COLONOSCOPY WITH PROPOFOL N/A 12/02/2016   Procedure: COLONOSCOPY WITH PROPOFOL;  Surgeon: Manya Silvas, MD;   Location: [facility] ENDOSCOPY;  Service: Endoscopy;  Laterality: N/A;  . COLONOSCOPY WITH PROPOFOL N/A 11/10/2017   Procedure: COLONOSCOPY WITH PROPOFOL;  Surgeon: Toledo, Benay Pike, MD;  Location: [facility] ENDOSCOPY;  Service: Gastroenterology;  Laterality: N/A;  . colonoscopy with removal lesions by snare    . Endoscoic carpal tunnel release    . ESOPHAGOGASTRODUODENOSCOPY N/A 11/10/2017   Procedure: ESOPHAGOGASTRODUODENOSCOPY (EGD);  Surgeon: Toledo, Benay Pike, MD;  Location: [facility] ENDOSCOPY;  Service: Gastroenterology;  Laterality: N/A;  . ESOPHAGOGASTRODUODENOSCOPY (EGD) WITH PROPOFOL N/A 12/02/2016   Procedure: ESOPHAGOGASTRODUODENOSCOPY (EGD) WITH PROPOFOL;  Surgeon: Manya Silvas, MD;  Location: [facility] ENDOSCOPY;  Service: Endoscopy;  Laterality: N/A;  . IR IMAGING GUIDED PORT INSERTION  10/01/2017  . Laminectomy posterior lumbar facetectomy and formaninotomy w/Decomp    . Laminectony posterior cervicle decomp w/Facectomy and foraminotomy    . VEIN LIGATION AND STRIPPING Left     Social History   Socioeconomic History  . Marital status: Widowed    Spouse name: Not on file  . Number of children: Not on file  . Years of education: Not on file  . Highest education level: Not on file  Occupational History  . Not on file  Tobacco Use  .  Smoking status: Never Smoker  . Smokeless tobacco: Never Used  Substance and Sexual Activity  . Alcohol use: No  . Drug use: No  . Sexual activity: Not on file  Other Topics Concern  . Not on file  Social History Narrative  . Not on file   Social Determinants of Health   Financial Resource Strain:   . Difficulty of Paying Living Expenses: Not on file  Food Insecurity:   . Worried About Charity fundraiser in the Last Year: Not on file  . Ran Out of Food in the Last Year: Not on file  Transportation Needs:   . Lack of Transportation (Medical): Not on file  . Lack of Transportation (Non-Medical): Not on file  Physical Activity:   . Days of Exercise  per Week: Not on file  . Minutes of Exercise per Session: Not on file  Stress:   . Feeling of Stress : Not on file  Social Connections:   . Frequency of Communication with Friends and Family: Not on file  . Frequency of Social Gatherings with Friends and Family: Not on file  . Attends Religious Services: Not on file  . Active Member of Clubs or Organizations: Not on file  . Attends Archivist Meetings: Not on file  . Marital Status: Not on file  Intimate Partner Violence:   . Fear of Current or Ex-Partner: Not on file  . Emotionally Abused: Not on file  . Physically Abused: Not on file  . Sexually Abused: Not on file    Family History  Problem Relation Age of Onset  . Coronary artery disease Mother   . Diabetes type II Mother   . Hypertension Mother   . Tuberculosis Father   . Stroke Father   . Diabetes type II Sister   . Migraines Sister   . Alcohol abuse Brother   . Coronary artery disease Brother   . Diabetes type II Brother   . Kidney cancer Brother   . Kidney disease Brother   . Heart attack Brother   . Hypertension Brother     Allergies  Allergen Reactions  . Aspirin     Other reaction(s): Other (see comments)  . Hydrocodone-Acetaminophen Other (See Comments)    Sedation and GI upset, pt is not allergic to acetaminophen  . Statins Other (See Comments)  . Tramadol-Acetaminophen Nausea And Vomiting    GI upset, pt is not allergic to acetaminophen     Review of Systems   Review of Systems: Negative Unless Checked Constitutional: [] Weight loss  [] Fever  [] Chills Cardiac: [] Chest pain   []  Atrial Fibrillation  [] Palpitations   [] Shortness of breath when laying flat   [] Shortness of breath with exertion. [] Shortness of breath at rest Vascular:  [] Pain in legs with walking   [] Pain in legs with standing [] Pain in legs when laying flat   [] Claudication    [] Pain in feet when laying flat    [] History of DVT   [] Phlebitis   [x] Swelling in legs   [x] Varicose  veins   [] Non-healing ulcers Pulmonary:   [] Uses home oxygen   [] Productive cough   [] Hemoptysis   [] Wheeze  [] COPD   [] Asthma Neurologic:  [] Dizziness   [] Seizures  [] Blackouts [] History of stroke   [] History of TIA  [] Aphasia   [] Temporary Blindness   [] Weakness or numbness in arm   [] Weakness or numbness in leg Musculoskeletal:   [] Joint swelling   [] Joint pain   [] Low back pain  []   History of Knee Replacement [] Arthritis [] back Surgeries  []  Spinal Stenosis    Hematologic:  [] Easy bruising  [] Easy bleeding   [] Hypercoagulable state   [x] Anemic Gastrointestinal:  [] Diarrhea   [] Vomiting  [x] Gastroesophageal reflux/heartburn   [] Difficulty swallowing. [] Abdominal pain Genitourinary:  [x] Chronic kidney disease   [] Difficult urination  [] Anuric   [] Blood in urine [] Frequent urination  [] Burning with urination   [] Hematuria Skin:  [] Rashes   [x] Ulcers [] Wounds Psychological:  [] History of anxiety   []  History of major depression  []  Memory Difficulties      OBJECTIVE:   Physical Exam  BP 130/69 (BP Location: Right Arm)   Pulse 80   Resp 18   Ht 5\' 4"  (1.626 m)   Wt 260 lb (117.9 kg)   BMI 44.63 kg/m   Gen: WD/WN, NAD Head: [place]/AT, No temporalis wasting.  Ear/Nose/Throat: Hearing grossly intact, nares w/o erythema or drainage Eyes: PER, EOMI, sclera nonicteric.  Neck: Supple, no masses.  No JVD.  Pulmonary:  Good air movement, no use of accessory muscles.  Cardiac: RRR Vascular:  2+ edema bilateral Vessel Right Left  Radial Palpable Palpable  Dorsalis Pedis Palpable Palpable  Posterior Tibial Not Palpable Not Palpable   Gastrointestinal: soft, non-distended. No guarding/no peritoneal signs.  Musculoskeletal: M/S 5/5 throughout.  No deformity or atrophy.  Neurologic: Pain and light touch intact in extremities.  Symmetrical.  Speech is fluent. Motor exam as listed above. Psychiatric: Judgment intact, Mood & affect appropriate for pt's clinical situation. Dermatologic:  Mild stasis  dermatitis bilaterally with ulceration on the left lateral calf.  No changes consistent with cellulitis. Lymph : No Cervical lymphadenopathy, dermal thickening and lichenification bilaterally      ASSESSMENT AND PLAN:  1. Lymphedema Patient will continue with conservative therapy with her right lower extremity.  She will continue her medical grade 1 compression stockings.  I have also advised the patient that following her episode within the wraps if she continues to have issues with swelling we may want to consider increasing her compression from 20-30 to 30-40 millimeters of mercury to provide better control.  I have suggested that she order 1.  Just to see if she is able to tolerate it prior to ordering numerous. 2. Essential hypertension Blood pressure doing well today.  No changes needed.  3. Venous ulcer of left leg (HCC) Very nearly healed however given the patient's previous history of early reulceration we will continue to keep the patient in [place] wraps at this time.  We will also switch from zinc to calamine as the zinc has been somewhat drying her leg.  The patient is in agreement with this plan.  She will continue to come to the office weekly for Unna wrap changes and we will reevaluate the progress in 4 weeks.   Current Outpatient Medications on File Prior to Visit  Medication Sig Dispense Refill  . acetaminophen (TYLENOL) 500 MG tablet Take 500 mg by mouth every 6 (six) hours as needed.     Marland Kitchen albuterol (PROVENTIL HFA;VENTOLIN HFA) 108 (90 Base) MCG/ACT inhaler Inhale 2 puffs into the lungs every 6 (six) hours as needed for wheezing or shortness of breath.    Marland Kitchen amLODipine (NORVASC) 5 MG tablet Take 10 mg by mouth daily.     Marland Kitchen aspirin EC 81 MG tablet Take 81 mg by mouth daily.    . ASSURE COMFORT LANCETS 30G MISC     . calcitRIOL (ROCALTROL) 0.25 MCG capsule Take 0.5 mcg by mouth 3 (three) times  a week.     . carvedilol (COREG) 6.25 MG tablet Take 6.25 mg by mouth 2 (two) times  daily.  12  . doxycycline (VIBRAMYCIN) 100 MG capsule Take 1 capsule (100 mg total) by mouth 2 (two) times daily. 20 capsule 0  . ferrous sulfate 325 (65 FE) MG tablet Take 325 mg by mouth daily.    . furosemide (LASIX) 40 MG tablet Take 40 mg by mouth daily.     Marland Kitchen gabapentin (NEURONTIN) 100 MG capsule Take 100 mg by mouth 2 (two) times daily.     Marland Kitchen gemfibrozil (LOPID) 600 MG tablet Take 600 mg by mouth daily.     Marland Kitchen gemfibrozil (LOPID) 600 MG tablet TAKE 1 TABLET BY MOUTH EVERY DAY    . glimepiride (AMARYL) 1 MG tablet Take 0.5 mg by mouth daily with breakfast.     . hydrALAZINE (APRESOLINE) 50 MG tablet Take 25 mg by mouth 3 (three) times daily.   12  . Lancet Devices (ADJUSTABLE LANCING DEVICE) MISC     . latanoprost (XALATAN) 0.005 % ophthalmic solution Place 1 drop into both eyes.     Marland Kitchen lidocaine-prilocaine (EMLA) cream Apply 1 application topically as needed. 30 g 3  . losartan (COZAAR) 50 MG tablet Take 100 mg by mouth daily.     . Multiple Vitamins-Minerals (CENTRUM SILVER) tablet Take 1 tablet by mouth daily.     . Omega-3 Fatty Acids (FISH OIL) 1000 MG CAPS Take 1,000 mg by mouth daily.     Marland Kitchen omeprazole (PRILOSEC) 20 MG capsule Take 20 mg by mouth daily.     Marland Kitchen omeprazole (PRILOSEC) 20 MG capsule TAKE 1 CAPSULE BY MOUTH EVERY DAY    . ONE TOUCH ULTRA TEST test strip     . pioglitazone (ACTOS) 30 MG tablet Take 30 mg by mouth daily.      No current facility-administered medications on file prior to visit.    There are no Patient Instructions on file for this visit. No follow-ups on file.   Kris Hartmann, NP  This note was completed with Sales executive.  Any errors are purely unintentional.

## 2019-05-02 ENCOUNTER — Encounter (INDEPENDENT_AMBULATORY_CARE_PROVIDER_SITE_OTHER): Payer: Self-pay | Admitting: Nurse Practitioner

## 2019-05-02 ENCOUNTER — Ambulatory Visit (INDEPENDENT_AMBULATORY_CARE_PROVIDER_SITE_OTHER): Payer: Medicare HMO | Admitting: Nurse Practitioner

## 2019-05-02 ENCOUNTER — Other Ambulatory Visit: Payer: Self-pay

## 2019-05-02 VITALS — BP 147/69 | HR 76 | Resp 16 | Ht 64.0 in | Wt 259.0 lb

## 2019-05-02 DIAGNOSIS — I83029 Varicose veins of left lower extremity with ulcer of unspecified site: Secondary | ICD-10-CM

## 2019-05-02 DIAGNOSIS — L97929 Non-pressure chronic ulcer of unspecified part of left lower leg with unspecified severity: Secondary | ICD-10-CM

## 2019-05-02 NOTE — Progress Notes (Signed)
History of Present Illness  There is no documented history at this time  Assessments & Plan   There are no diagnoses linked to this encounter.    Additional instructions  Subjective:  Patient presents with venous ulcer of the Left lower extremity.    Procedure:  3 layer unna wrap was placed Left lower extremity.   Plan:   Follow up in one week.  

## 2019-05-09 ENCOUNTER — Inpatient Hospital Stay: Payer: Medicare HMO | Attending: Hematology and Oncology

## 2019-05-09 ENCOUNTER — Ambulatory Visit (INDEPENDENT_AMBULATORY_CARE_PROVIDER_SITE_OTHER): Payer: Medicare HMO | Admitting: Nurse Practitioner

## 2019-05-09 ENCOUNTER — Encounter (INDEPENDENT_AMBULATORY_CARE_PROVIDER_SITE_OTHER): Payer: Self-pay

## 2019-05-09 ENCOUNTER — Inpatient Hospital Stay: Payer: Medicare HMO

## 2019-05-09 ENCOUNTER — Other Ambulatory Visit: Payer: Self-pay

## 2019-05-09 VITALS — BP 145/71 | HR 70 | Resp 16

## 2019-05-09 VITALS — BP 157/66 | HR 94 | Resp 16 | Wt 261.0 lb

## 2019-05-09 DIAGNOSIS — Z811 Family history of alcohol abuse and dependence: Secondary | ICD-10-CM | POA: Diagnosis not present

## 2019-05-09 DIAGNOSIS — Z823 Family history of stroke: Secondary | ICD-10-CM | POA: Insufficient documentation

## 2019-05-09 DIAGNOSIS — L97929 Non-pressure chronic ulcer of unspecified part of left lower leg with unspecified severity: Secondary | ICD-10-CM

## 2019-05-09 DIAGNOSIS — Z836 Family history of other diseases of the respiratory system: Secondary | ICD-10-CM | POA: Insufficient documentation

## 2019-05-09 DIAGNOSIS — Z8051 Family history of malignant neoplasm of kidney: Secondary | ICD-10-CM | POA: Diagnosis not present

## 2019-05-09 DIAGNOSIS — D7589 Other specified diseases of blood and blood-forming organs: Secondary | ICD-10-CM | POA: Insufficient documentation

## 2019-05-09 DIAGNOSIS — Z79899 Other long term (current) drug therapy: Secondary | ICD-10-CM | POA: Diagnosis not present

## 2019-05-09 DIAGNOSIS — N1832 Chronic kidney disease, stage 3b: Secondary | ICD-10-CM

## 2019-05-09 DIAGNOSIS — E785 Hyperlipidemia, unspecified: Secondary | ICD-10-CM | POA: Diagnosis not present

## 2019-05-09 DIAGNOSIS — N183 Chronic kidney disease, stage 3 unspecified: Secondary | ICD-10-CM

## 2019-05-09 DIAGNOSIS — E119 Type 2 diabetes mellitus without complications: Secondary | ICD-10-CM | POA: Insufficient documentation

## 2019-05-09 DIAGNOSIS — Z8249 Family history of ischemic heart disease and other diseases of the circulatory system: Secondary | ICD-10-CM | POA: Diagnosis not present

## 2019-05-09 DIAGNOSIS — D631 Anemia in chronic kidney disease: Secondary | ICD-10-CM | POA: Diagnosis present

## 2019-05-09 DIAGNOSIS — Z841 Family history of disorders of kidney and ureter: Secondary | ICD-10-CM | POA: Insufficient documentation

## 2019-05-09 DIAGNOSIS — I83029 Varicose veins of left lower extremity with ulcer of unspecified site: Secondary | ICD-10-CM | POA: Diagnosis not present

## 2019-05-09 DIAGNOSIS — K219 Gastro-esophageal reflux disease without esophagitis: Secondary | ICD-10-CM | POA: Diagnosis not present

## 2019-05-09 DIAGNOSIS — Z833 Family history of diabetes mellitus: Secondary | ICD-10-CM | POA: Diagnosis not present

## 2019-05-09 LAB — VITAMIN B12: Vitamin B-12: 727 pg/mL (ref 180–914)

## 2019-05-09 LAB — TSH: TSH: 5.026 u[IU]/mL — ABNORMAL HIGH (ref 0.350–4.500)

## 2019-05-09 LAB — HEMOGLOBIN AND HEMATOCRIT, BLOOD
HCT: 29.7 % — ABNORMAL LOW (ref 36.0–46.0)
Hemoglobin: 9.3 g/dL — ABNORMAL LOW (ref 12.0–15.0)

## 2019-05-09 LAB — FOLATE: Folate: 25 ng/mL (ref >5.9)

## 2019-05-09 MED ORDER — SODIUM CHLORIDE 0.9% FLUSH
10.0000 mL | INTRAVENOUS | Status: DC | PRN
  Administered 2019-05-09: 10 mL via INTRAVENOUS
  Filled 2019-05-09: qty 10

## 2019-05-09 MED ORDER — EPOETIN ALFA-EPBX 10000 UNIT/ML IJ SOLN
10000.0000 [IU] | Freq: Once | INTRAMUSCULAR | Status: AC
  Administered 2019-05-09: 10000 [IU] via SUBCUTANEOUS

## 2019-05-09 MED ORDER — HEPARIN SOD (PORK) LOCK FLUSH 100 UNIT/ML IV SOLN
500.0000 [IU] | Freq: Once | INTRAVENOUS | Status: AC
  Administered 2019-05-09: 500 [IU] via INTRAVENOUS
  Filled 2019-05-09: qty 5

## 2019-05-09 NOTE — Patient Instructions (Signed)

## 2019-05-09 NOTE — Progress Notes (Signed)
History of Present Illness  There is no documented history at this time  Assessments & Plan   There are no diagnoses linked to this encounter.    Additional instructions  Subjective:  Patient presents with venous ulcer of the Left lower extremity.    Procedure:  3 layer unna wrap was placed Left lower extremity.   Plan:   Follow up in one week.  

## 2019-05-13 ENCOUNTER — Other Ambulatory Visit: Payer: Self-pay | Admitting: Nurse Practitioner

## 2019-05-16 ENCOUNTER — Other Ambulatory Visit: Payer: Self-pay

## 2019-05-16 ENCOUNTER — Ambulatory Visit (INDEPENDENT_AMBULATORY_CARE_PROVIDER_SITE_OTHER): Payer: Medicare HMO | Admitting: Nurse Practitioner

## 2019-05-16 VITALS — BP 181/77 | HR 73 | Resp 18 | Wt 262.0 lb

## 2019-05-16 DIAGNOSIS — I83029 Varicose veins of left lower extremity with ulcer of unspecified site: Secondary | ICD-10-CM | POA: Diagnosis not present

## 2019-05-16 DIAGNOSIS — L97929 Non-pressure chronic ulcer of unspecified part of left lower leg with unspecified severity: Secondary | ICD-10-CM | POA: Diagnosis not present

## 2019-05-16 NOTE — Progress Notes (Signed)
History of Present Illness  There is no documented history at this time  Assessments & Plan   There are no diagnoses linked to this encounter.    Additional instructions  Subjective:  Patient presents with venous ulcer of the Left lower extremity.    Procedure:  3 layer unna wrap was placed Left lower extremity.   Plan:   Follow up in one week.  

## 2019-05-19 ENCOUNTER — Encounter (INDEPENDENT_AMBULATORY_CARE_PROVIDER_SITE_OTHER): Payer: Self-pay | Admitting: Nurse Practitioner

## 2019-05-23 ENCOUNTER — Other Ambulatory Visit: Payer: Self-pay

## 2019-05-23 ENCOUNTER — Ambulatory Visit (INDEPENDENT_AMBULATORY_CARE_PROVIDER_SITE_OTHER): Payer: Medicare HMO | Admitting: Nurse Practitioner

## 2019-05-23 ENCOUNTER — Encounter (INDEPENDENT_AMBULATORY_CARE_PROVIDER_SITE_OTHER): Payer: Self-pay | Admitting: Nurse Practitioner

## 2019-05-23 ENCOUNTER — Inpatient Hospital Stay: Payer: Medicare HMO

## 2019-05-23 VITALS — BP 193/69 | HR 84 | Resp 16 | Wt 262.8 lb

## 2019-05-23 VITALS — BP 148/78 | HR 70 | Temp 96.7°F | Resp 18

## 2019-05-23 DIAGNOSIS — I83029 Varicose veins of left lower extremity with ulcer of unspecified site: Secondary | ICD-10-CM | POA: Diagnosis not present

## 2019-05-23 DIAGNOSIS — I890 Lymphedema, not elsewhere classified: Secondary | ICD-10-CM

## 2019-05-23 DIAGNOSIS — L97929 Non-pressure chronic ulcer of unspecified part of left lower leg with unspecified severity: Secondary | ICD-10-CM

## 2019-05-23 DIAGNOSIS — N183 Chronic kidney disease, stage 3 unspecified: Secondary | ICD-10-CM

## 2019-05-23 DIAGNOSIS — D631 Anemia in chronic kidney disease: Secondary | ICD-10-CM

## 2019-05-23 DIAGNOSIS — N1832 Chronic kidney disease, stage 3b: Secondary | ICD-10-CM

## 2019-05-23 LAB — HEMOGLOBIN AND HEMATOCRIT, BLOOD
HCT: 31.8 % — ABNORMAL LOW (ref 36.0–46.0)
Hemoglobin: 9.7 g/dL — ABNORMAL LOW (ref 12.0–15.0)

## 2019-05-23 MED ORDER — EPOETIN ALFA-EPBX 10000 UNIT/ML IJ SOLN
10000.0000 [IU] | Freq: Once | INTRAMUSCULAR | Status: AC
  Administered 2019-05-23: 10000 [IU] via SUBCUTANEOUS

## 2019-05-23 NOTE — Patient Instructions (Signed)

## 2019-05-26 ENCOUNTER — Encounter (INDEPENDENT_AMBULATORY_CARE_PROVIDER_SITE_OTHER): Payer: Self-pay | Admitting: Nurse Practitioner

## 2019-05-26 NOTE — Progress Notes (Signed)
SUBJECTIVE:  Patient ID: Sara Bright, female    DOB: 1940/09/04, 79 y.o.   MRN: [id number] Chief Complaint  Patient presents with  . Follow-up    unna check    HPI  Sara Bright is a 79 y.o. female that presents today for follow-up wound check.  The patient's edema is much controlled as are the previous ulceration is healed.  However the patient has had a small ulceration appearing on her left lower extremity in the medial portion.  It appears that this may have been due to pulling some dry skin off and causing a very small open area.  She denies any pain associated with this.  She denies any drainage of the wound.  She denies any TIA-like symptoms.  Overall the patient also continues to do other conservative therapy methods such as elevating her lower extremities and exercising.  Past Medical History:  Diagnosis Date  . Adenoma of colon   . Adenomatous colon polyp   . Anemia    IDA  . Cataract   . Cervical stenosis of spinal canal   . Chronic kidney disease    stage 3  . Diabetes mellitus without complication ([place])   . GERD (gastroesophageal reflux disease)   . Glaucoma   . Heme positive stool   . History of UTI   . Hx of gallstones   . Hyperkalemia   . Hyperlipidemia   . Hyperlipidemia   . Hypertension   . Lumbar stenosis   . Lymphedema   . Neuromuscular disorder ([place])   . Neuropathy associated with endocrine disorder ([place])   . Obesity   . OSA on CPAP    on CPAP  . Osteoarthritis   . Osteoarthritis   . Port-A-Cath in place   . Retinopathy due to secondary diabetes ([place])   . Secondary hyperparathyroidism of renal origin [facility])     Past Surgical History:  Procedure Laterality Date  . ACDF X2    . BACK SURGERY    . Cataract extraction right    . catarect extraction left    . CHOLECYSTECTOMY    . COLONOSCOPY WITH PROPOFOL N/A 12/02/2016   Procedure: COLONOSCOPY WITH PROPOFOL;  Surgeon: Manya Silvas, MD;  Location: [facility] ENDOSCOPY;  Service: Endoscopy;   Laterality: N/A;  . COLONOSCOPY WITH PROPOFOL N/A 11/10/2017   Procedure: COLONOSCOPY WITH PROPOFOL;  Surgeon: Toledo, Benay Pike, MD;  Location: [facility] ENDOSCOPY;  Service: Gastroenterology;  Laterality: N/A;  . colonoscopy with removal lesions by snare    . Endoscoic carpal tunnel release    . ESOPHAGOGASTRODUODENOSCOPY N/A 11/10/2017   Procedure: ESOPHAGOGASTRODUODENOSCOPY (EGD);  Surgeon: Toledo, Benay Pike, MD;  Location: [facility] ENDOSCOPY;  Service: Gastroenterology;  Laterality: N/A;  . ESOPHAGOGASTRODUODENOSCOPY (EGD) WITH PROPOFOL N/A 12/02/2016   Procedure: ESOPHAGOGASTRODUODENOSCOPY (EGD) WITH PROPOFOL;  Surgeon: Manya Silvas, MD;  Location: [facility] ENDOSCOPY;  Service: Endoscopy;  Laterality: N/A;  . IR IMAGING GUIDED PORT INSERTION  10/01/2017  . Laminectomy posterior lumbar facetectomy and formaninotomy w/Decomp    . Laminectony posterior cervicle decomp w/Facectomy and foraminotomy    . VEIN LIGATION AND STRIPPING Left     Social History   Socioeconomic History  . Marital status: Widowed    Spouse name: Not on file  . Number of children: Not on file  . Years of education: Not on file  . Highest education level: Not on file  Occupational History  . Not on file  Tobacco Use  . Smoking status: Never Smoker  .  Smokeless tobacco: Never Used  Substance and Sexual Activity  . Alcohol use: No  . Drug use: No  . Sexual activity: Not on file  Other Topics Concern  . Not on file  Social History Narrative  . Not on file   Social Determinants of Health   Financial Resource Strain:   . Difficulty of Paying Living Expenses:   Food Insecurity:   . Worried About Charity fundraiser in the Last Year:   . Arboriculturist in the Last Year:   Transportation Needs:   . Film/video editor (Medical):   Marland Kitchen Lack of Transportation (Non-Medical):   Physical Activity:   . Days of Exercise per Week:   . Minutes of Exercise per Session:   Stress:   . Feeling of Stress :   Social  Connections:   . Frequency of Communication with Friends and Family:   . Frequency of Social Gatherings with Friends and Family:   . Attends Religious Services:   . Active Member of Clubs or Organizations:   . Attends Archivist Meetings:   Marland Kitchen Marital Status:   Intimate Partner Violence:   . Fear of Current or Ex-Partner:   . Emotionally Abused:   Marland Kitchen Physically Abused:   . Sexually Abused:     Family History  Problem Relation Age of Onset  . Coronary artery disease Mother   . Diabetes type II Mother   . Hypertension Mother   . Tuberculosis Father   . Stroke Father   . Diabetes type II Sister   . Migraines Sister   . Alcohol abuse Brother   . Coronary artery disease Brother   . Diabetes type II Brother   . Kidney cancer Brother   . Kidney disease Brother   . Heart attack Brother   . Hypertension Brother     Allergies  Allergen Reactions  . Aspirin     Other reaction(s): Other (see comments)  . Hydrocodone-Acetaminophen Other (See Comments)    Sedation and GI upset, pt is not allergic to acetaminophen  . Statins Other (See Comments)  . Tramadol-Acetaminophen Nausea And Vomiting    GI upset, pt is not allergic to acetaminophen     Review of Systems   Review of Systems: Negative Unless Checked Constitutional: [] Weight loss  [] Fever  [] Chills Cardiac: [] Chest pain   []  Atrial Fibrillation  [] Palpitations   [] Shortness of breath when laying flat   [] Shortness of breath with exertion. [] Shortness of breath at rest Vascular:  [] Pain in legs with walking   [] Pain in legs with standing [] Pain in legs when laying flat   [x] Claudication    [] Pain in feet when laying flat    [] History of DVT   [] Phlebitis   [x] Swelling in legs   [] Varicose veins   [] Non-healing ulcers Pulmonary:   [] Uses home oxygen   [] Productive cough   [] Hemoptysis   [] Wheeze  [] COPD   [] Asthma Neurologic:  [] Dizziness   [] Seizures  [] Blackouts [] History of stroke   [] History of TIA  [] Aphasia    [] Temporary Blindness   [] Weakness or numbness in arm   [] Weakness or numbness in leg Musculoskeletal:   [] Joint swelling   [] Joint pain   [] Low back pain  []  History of Knee Replacement [x] Arthritis [] back Surgeries  []  Spinal Stenosis    Hematologic:  [] Easy bruising  [] Easy bleeding   [] Hypercoagulable state   [] Anemic Gastrointestinal:  [] Diarrhea   [] Vomiting  [] Gastroesophageal reflux/heartburn   [] Difficulty swallowing. [] Abdominal  pain Genitourinary:  [] Chronic kidney disease   [] Difficult urination  [] Anuric   [] Blood in urine [] Frequent urination  [] Burning with urination   [] Hematuria Skin:  [] Rashes   [x] Ulcers [] Wounds Psychological:  [] History of anxiety   []  History of major depression  []  Memory Difficulties      OBJECTIVE:   Physical Exam  BP (!) 193/69 (BP Location: Right Arm)   Pulse 84   Resp 16   Wt 262 lb 12.8 oz (119.2 kg)   BMI 45.11 kg/m   Gen: WD/WN, NAD Head: [place]/AT, No temporalis wasting.  Ear/Nose/Throat: Hearing grossly intact, nares w/o erythema or drainage Eyes: PER, EOMI, sclera nonicteric.  Neck: Supple, no masses.  No JVD.  Pulmonary:  Good air movement, no use of accessory muscles.  Cardiac: RRR Vascular:  2+ edema bilaterally Vessel Right Left  Radial Palpable Palpable  Dorsalis Pedis Not Palpable Not Palpable  Posterior Tibial Not Palpable Not Palpable   Gastrointestinal: soft, non-distended. No guarding/no peritoneal signs.  Musculoskeletal: M/S 5/5 throughout.  No deformity or atrophy.  Neurologic: Pain and light touch intact in extremities.  Symmetrical.  Speech is fluent. Motor exam as listed above. Psychiatric: Judgment intact, Mood & affect appropriate for pt's clinical situation. Dermatologic: No Venous rashes.  Very small open area on the leg No changes consistent with cellulitis. Lymph : No Cervical lymphadenopathy, lichenification or left extremity      ASSESSMENT AND PLAN:  1. Venous ulcer of left leg ([place]) Patient has a new  very shallow ulceration on her left lower extremity.  It looks like an area that was covered with scabbing was pulled off and that is likely the cause ulceration.  We will continue to have the patient wrapped however patient is advised that if it appears that the ulceration has not gone away we can possibly remove her out of [place] wraps sooner than 4 weeks.  2. Lymphedema Patient will continue other conservative therapies such as elevation of lower extremities, exercise and compression.  We will also try to facilitate referral to Occupational Therapy for lymphedema management the patient will be able to find utilize additional tactics to manage her lymphedema.   Current Outpatient Medications on File Prior to Visit  Medication Sig Dispense Refill  . acetaminophen (TYLENOL) 500 MG tablet Take 500 mg by mouth every 6 (six) hours as needed.     Marland Kitchen albuterol (PROVENTIL HFA;VENTOLIN HFA) 108 (90 Base) MCG/ACT inhaler Inhale 2 puffs into the lungs every 6 (six) hours as needed for wheezing or shortness of breath.    Marland Kitchen amLODipine (NORVASC) 5 MG tablet Take 10 mg by mouth daily.     Marland Kitchen aspirin EC 81 MG tablet Take 81 mg by mouth daily.    . ASSURE COMFORT LANCETS 30G MISC     . calcitRIOL (ROCALTROL) 0.25 MCG capsule Take 0.5 mcg by mouth 3 (three) times a week.     . carvedilol (COREG) 6.25 MG tablet Take 6.25 mg by mouth 2 (two) times daily.  12  . doxycycline (VIBRAMYCIN) 100 MG capsule Take 1 capsule (100 mg total) by mouth 2 (two) times daily. 20 capsule 0  . ferrous sulfate 325 (65 FE) MG tablet Take 325 mg by mouth daily.    . furosemide (LASIX) 40 MG tablet Take 40 mg by mouth daily.     Marland Kitchen gabapentin (NEURONTIN) 100 MG capsule Take 100 mg by mouth 2 (two) times daily.     Marland Kitchen gemfibrozil (LOPID) 600 MG tablet Take 600  mg by mouth daily.     Marland Kitchen gemfibrozil (LOPID) 600 MG tablet TAKE 1 TABLET BY MOUTH EVERY DAY    . glimepiride (AMARYL) 1 MG tablet Take 0.5 mg by mouth daily with breakfast.     .  hydrALAZINE (APRESOLINE) 50 MG tablet Take 25 mg by mouth 3 (three) times daily.   12  . Lancet Devices (ADJUSTABLE LANCING DEVICE) MISC     . latanoprost (XALATAN) 0.005 % ophthalmic solution Place 1 drop into both eyes.     Marland Kitchen lidocaine-prilocaine (EMLA) cream Apply 1 application topically as needed. 30 g 3  . losartan (COZAAR) 50 MG tablet Take 100 mg by mouth daily.     . Multiple Vitamins-Minerals (CENTRUM SILVER) tablet Take 1 tablet by mouth daily.     . Omega-3 Fatty Acids (FISH OIL) 1000 MG CAPS Take 1,000 mg by mouth daily.     Marland Kitchen omeprazole (PRILOSEC) 20 MG capsule Take 20 mg by mouth daily.     Marland Kitchen omeprazole (PRILOSEC) 20 MG capsule TAKE 1 CAPSULE BY MOUTH EVERY DAY    . ONE TOUCH ULTRA TEST test strip     . pioglitazone (ACTOS) 30 MG tablet Take 30 mg by mouth daily.      No current facility-administered medications on file prior to visit.    There are no Patient Instructions on file for this visit. No follow-ups on file.   Kris Hartmann, NP  This note was completed with Sales executive.  Any errors are purely unintentional.

## 2019-05-30 ENCOUNTER — Ambulatory Visit (INDEPENDENT_AMBULATORY_CARE_PROVIDER_SITE_OTHER): Payer: Medicare HMO | Admitting: Nurse Practitioner

## 2019-05-30 ENCOUNTER — Encounter (INDEPENDENT_AMBULATORY_CARE_PROVIDER_SITE_OTHER): Payer: Self-pay

## 2019-05-30 ENCOUNTER — Other Ambulatory Visit: Payer: Self-pay

## 2019-05-30 VITALS — BP 147/67 | HR 75 | Resp 16 | Wt 260.6 lb

## 2019-05-30 DIAGNOSIS — I890 Lymphedema, not elsewhere classified: Secondary | ICD-10-CM

## 2019-05-30 NOTE — Progress Notes (Signed)
History of Present Illness  There is no documented history at this time  Assessments & Plan   There are no diagnoses linked to this encounter.    Additional instructions  Subjective:  Patient presents with venous ulcer of the Left lower extremity.    Procedure:  3 layer unna wrap was placed Left lower extremity.   Plan:   Follow up in one week.  

## 2019-06-04 ENCOUNTER — Encounter (INDEPENDENT_AMBULATORY_CARE_PROVIDER_SITE_OTHER): Payer: Self-pay | Admitting: Nurse Practitioner

## 2019-06-05 ENCOUNTER — Ambulatory Visit: Payer: Medicare HMO | Attending: Nurse Practitioner | Admitting: Occupational Therapy

## 2019-06-05 ENCOUNTER — Encounter: Payer: Self-pay | Admitting: Occupational Therapy

## 2019-06-05 ENCOUNTER — Other Ambulatory Visit: Payer: Self-pay

## 2019-06-05 DIAGNOSIS — I890 Lymphedema, not elsewhere classified: Secondary | ICD-10-CM | POA: Insufficient documentation

## 2019-06-05 NOTE — Patient Instructions (Signed)

## 2019-06-06 ENCOUNTER — Ambulatory Visit (INDEPENDENT_AMBULATORY_CARE_PROVIDER_SITE_OTHER): Payer: Medicare HMO | Admitting: Nurse Practitioner

## 2019-06-06 ENCOUNTER — Other Ambulatory Visit: Payer: Self-pay

## 2019-06-06 ENCOUNTER — Inpatient Hospital Stay: Payer: Medicare HMO

## 2019-06-06 ENCOUNTER — Encounter (INDEPENDENT_AMBULATORY_CARE_PROVIDER_SITE_OTHER): Payer: Self-pay

## 2019-06-06 VITALS — BP 148/66 | HR 81 | Resp 16 | Ht 64.0 in | Wt 230.0 lb

## 2019-06-06 VITALS — BP 144/64 | HR 65 | Resp 16

## 2019-06-06 DIAGNOSIS — L97929 Non-pressure chronic ulcer of unspecified part of left lower leg with unspecified severity: Secondary | ICD-10-CM | POA: Diagnosis not present

## 2019-06-06 DIAGNOSIS — D631 Anemia in chronic kidney disease: Secondary | ICD-10-CM

## 2019-06-06 DIAGNOSIS — N1832 Chronic kidney disease, stage 3b: Secondary | ICD-10-CM

## 2019-06-06 DIAGNOSIS — N183 Chronic kidney disease, stage 3 unspecified: Secondary | ICD-10-CM | POA: Diagnosis not present

## 2019-06-06 DIAGNOSIS — I83029 Varicose veins of left lower extremity with ulcer of unspecified site: Secondary | ICD-10-CM | POA: Diagnosis not present

## 2019-06-06 LAB — HEMOGLOBIN AND HEMATOCRIT, BLOOD
HCT: 31.1 % — ABNORMAL LOW (ref 36.0–46.0)
Hemoglobin: 9.6 g/dL — ABNORMAL LOW (ref 12.0–15.0)

## 2019-06-06 MED ORDER — EPOETIN ALFA-EPBX 10000 UNIT/ML IJ SOLN
10000.0000 [IU] | Freq: Once | INTRAMUSCULAR | Status: AC
  Administered 2019-06-06: 10000 [IU] via SUBCUTANEOUS

## 2019-06-06 NOTE — Patient Instructions (Signed)

## 2019-06-06 NOTE — Therapy (Signed)
[place] MAIN [facility] SERVICES [address] [place], [place], [address] Phone: [phone]   Fax:  [phone]  Occupational Therapy Evaluation  Patient Details  Name: Sara Bright MRN: [id number] Date of Birth: January 04, 1941 Referring Provider (OT): Eulogio Ditch, [place]   Encounter Date: 06/05/2019  OT End of Session - 06/06/19 1606    Number of Visits  36    Date for OT Re-Evaluation  09/03/19    Activity Tolerance  Patient tolerated treatment well;No increased pain    Behavior During Therapy  WFL for tasks assessed/performed       Past Medical History:  Diagnosis Date  . Adenoma of colon   . Adenomatous colon polyp   . Anemia    IDA  . Cataract   . Cervical stenosis of spinal canal   . Chronic kidney disease    stage 3  . Diabetes mellitus without complication ([place])   . GERD (gastroesophageal reflux disease)   . Glaucoma   . Heme positive stool   . History of UTI   . Hx of gallstones   . Hyperkalemia   . Hyperlipidemia   . Hyperlipidemia   . Hypertension   . Lumbar stenosis   . Lymphedema   . Neuromuscular disorder ([place])   . Neuropathy associated with endocrine disorder ([place])   . Obesity   . OSA on CPAP    on CPAP  . Osteoarthritis   . Osteoarthritis   . Port-A-Cath in place   . Retinopathy due to secondary diabetes ([place])   . Secondary hyperparathyroidism of renal origin [facility])     Past Surgical History:  Procedure Laterality Date  . ACDF X2    . BACK SURGERY    . Cataract extraction right    . catarect extraction left    . CHOLECYSTECTOMY    . COLONOSCOPY WITH PROPOFOL N/A 12/02/2016   Procedure: COLONOSCOPY WITH PROPOFOL;  Surgeon: Manya Silvas, MD;  Location: [facility] ENDOSCOPY;  Service: Endoscopy;  Laterality: N/A;  . COLONOSCOPY WITH PROPOFOL N/A 11/10/2017   Procedure: COLONOSCOPY WITH PROPOFOL;  Surgeon: Toledo, Benay Pike, MD;  Location: [facility] ENDOSCOPY;  Service: Gastroenterology;  Laterality: N/A;  .  colonoscopy with removal lesions by snare    . Endoscoic carpal tunnel release    . ESOPHAGOGASTRODUODENOSCOPY N/A 11/10/2017   Procedure: ESOPHAGOGASTRODUODENOSCOPY (EGD);  Surgeon: Toledo, Benay Pike, MD;  Location: [facility] ENDOSCOPY;  Service: Gastroenterology;  Laterality: N/A;  . ESOPHAGOGASTRODUODENOSCOPY (EGD) WITH PROPOFOL N/A 12/02/2016   Procedure: ESOPHAGOGASTRODUODENOSCOPY (EGD) WITH PROPOFOL;  Surgeon: Manya Silvas, MD;  Location: [facility] ENDOSCOPY;  Service: Endoscopy;  Laterality: N/A;  . IR IMAGING GUIDED PORT INSERTION  10/01/2017  . Laminectomy posterior lumbar facetectomy and formaninotomy w/Decomp    . Laminectony posterior cervicle decomp w/Facectomy and foraminotomy    . VEIN LIGATION AND STRIPPING Left     There were no vitals filed for this visit.  Subjective Assessment - 06/05/19 1024    Subjective   Sara Bright is an 79 y o female referred to Occupational Therapy for evaluation and treatment of BLE lymphedema (LE)  by Eulogio Ditch, NP, of [facility]. Pt reports worsening left leg swelling started over 20 years ago. She reports in addition to swelling she developed recurrent, and slow healing left leg ulcers over the past 3 months.  Pt denies known family history of leg swelling. She has worn off-the-shelf compression stockings in the past without much success of controlling swelling, but  she does not currently use compression stockings. Ms. Boehning goals are to reduce and control the swelling in her legs so the ulcers heal and don't come back.    Pertinent History  Chronic leg swelling and associated pain > 20 years; , Hx recurrent  ulcers, hx of lymphorrhea, Contributing factors include CKD, stage 3, HTN, Obesity, OSA (has cpap), OA. Other pertinent medical hx include B neuropathy hands and feet, hx vein ligation and stripping s/p 5-6 yrs, cervicle and lumbar laminectomies, glaucoma and diabetic retinopathy    Limitations  BLE weakness, impaired  balance, impaired sensation hands and feet, impaired vision, increased falls risk, decreased hip AROM, claudication    Repetition  Increases Symptoms    Special Tests  +Stemmer base of toes bilaterally    Patient Stated Goals  Want to stop these leg ulcers from returning, Get leg swelling better under control and keep symptoms from getting worse.    Currently in Pain?  Yes    Pain Score  --   not rated numerically   Pain Location  Leg    Pain Orientation  Left;Right;Other (Comment)   L>R   Pain Descriptors / Indicators  Numbness;Sharp;Tightness;Burning;Shooting;Tingling;Tiring;Sore;Penetrating;Spasm;Heaviness;Discomfort    Pain Type  Chronic pain    Pain Onset  Other (comment)   >20 YEARS   Aggravating Factors   standing, walking > 15 minutes, extended dependent sitting    Pain Relieving Factors  elevation, compression, rubbing        [facility] OT Assessment - 06/06/19 0001      Assessment   Referring Provider (OT)  Eulogio Ditch, NPP    Onset Date/Surgical Date  --   swelling in legs > 20 years   Hand Dominance  Right    Prior Therapy  off-the-shelf "support hose" for quite a few years, Unna wraps ~ 3 months for LLE ulcer and swelling, No formal CDT. Venous ablation ~ 4-5 years ago      Precautions   Precautions  Fall;Other (comment)   Diabetes precautions   Precaution Comments  --   fall risk complicated by neuropathy and decreased vision     Restrictions   Weight Bearing Restrictions  No      Balance Screen   Has the patient fallen in the past 6 months  Yes    How many times?  5    Has the patient had a decrease in activity level because of a fear of falling?   No    Is the patient reluctant to leave their home because of a fear of falling?   No      Home  Environment   Family/patient expects to be discharged to:  Private residence    Living Arrangements  Alone    Available Help at Discharge  Family   daughter comes 3-4 x week cooking, lite cleaning, groceries   Type of  [place]   2 steps and B handrails   Home Layout  One level    Engineer, agricultural  Yes    How accessible  Accessible via Conservation officer, historic buildings - 2 wheels;Cane - single point;Grab bars - toilet;Other (comment)   night light, walker at night,   Additional Comments  furniture handholds at home    Lives With  Alone      Prior Function   Level of  Independence  Independent with basic ADLs;Independent with household mobility without device;Independent with community mobility without device;Needs assistance with homemaking;Independent with transfers    Vocation  Retired;Other (comment)   [employer]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   Leisure  church, gather with family, beach trips      IADL   Prior Level of Function Shopping  Mod A    Shopping  Completely unable to shop    Prior Level of Function Light Housekeeping  Min A    Light Housekeeping  [place] small items, rinses stockings, etc.;Performs light daily tasks such as dishwashing, bed making;Needs help with all home maintenance tasks;Performs light daily tasks but cannot maintain acceptable level of cleanliness    Prior Level of Function Meal Prep  mod A    Meal Prep  Needs to have meals prepared and served;Able to complete simple cold meal and snack prep;Able to complete simple warm meal prep    Prior Level of Function Community Mobility  I    Investment banker, corporate own vehicle      Mobility   Mobility Status  History of falls      Vision - History   Baseline Vision  Wears glasses all the time    Visual History  Retinopathy   Glaucoma, cataracts     Activity Tolerance   Activity Tolerance Comments   leg swelling+ back pain limit standing/walking tolerance to 15 minutes      Cognition   Overall Cognitive Status  Within Functional Limits for tasks assessed      Observation/Other Assessments   Outcome  Measures  BLE comparative limb volumetrics , FLES, LLIS      Posture/Postural Control   Posture/Postural Control  No significant limitations      Sensation   Light Touch  Impaired by gross assessment      Coordination   Gross Motor Movements are Fluid and Coordinated  Yes    Fine Motor Movements are Fluid and Coordinated  Yes      AROM   Overall AROM   Deficits    Overall AROM Comments  back, hiip and knee AROM limitations limit abilty to reach distal legs and feet for ADLs       LYMPHEDEMA/ONCOLOGY QUESTIONNAIRE - 06/06/19 1605      Lymphedema Stage   Stage  STAGE 2 SPONTANEOUSLY IRREVERSIBLE      Lymphedema Assessments   Lymphedema Assessments  Lower extremities      Right Lower Extremity Lymphedema   Other  TBA      Left Lower Extremity Lymphedema   Other  TBA       Moderate Stage II, B lower extremity/ B lower quadrant phlebo-lymphedema (LE) , L>R, 2/2 2/2 CVI and MO   Skin  Description Hyper-Keratosis Peau' de Orange Shiny Tight Fibrotic Fatty Doughy Indurated    x  x x X Spongy       Skin dry Flaky Erythema Macerated   x x     Color Redness Present Pallor Blanching Hemosiderin Staining Other   X Hyperpigmentation distally   x     Odor Malodorous Yeast Fungal infection  Absent      x   Temperature Warm Cool wnl    x    Pitting Edema   1+ 2+ 3+ 4+ Non-pitting/ indurated        x   Girth Symmetrical Asymmetrical Other Distribution    R>L    Stemmer Sign Positive Negative     +  Lymphorrea History Of:  Present Absent   x   x   Wounds History Of Present Absent Venous Arterial Pressure Size    x x  x         Signs of Infection Redness Warmth Erythema Acute Swelling Drainage [place]                   Scars  Adhesions Hypersensitivity          Sensation Light Touch Deep pressure Hypersensitivty   Present Impaired Present Impaired Absent Impaired    X by gross assessment        Nails WNL Fungus Other   x  Yellowed,  dystrophy  x Hair Growth Symmetrical Asymmetrical   X    Skin Creases Base of toes  Ankles   Base of Fingers Medial Thighs         Abdominal pannus Medial legs     x x                 OT Treatments/Exercises (OP) - 06/06/19 0001      Transfers   Transfers  Sit to Stand;Stand to Sit    Sit to Stand  With upper extremity assist;6: Modified independent (Device/Increase time);With armrests;From chair/3-in-1;Multiple attempts    Stand to Sit  6: Modified independent (Device/Increase time);With armrests;To chair/3-in-1;Uncontrolled descent      Exercises   Exercises  --   no regular exercise     Manual Therapy   Manual Therapy  Edema management            OT Education - 06/06/19 1606    Education Details  Provided Pt and family education regarding lymphatic structure and function, etiologies, onset patterns and stages of progression. Discussed  impact of obesity on lymphatic function. Outlined Complete Decongestive Therapy (CDT)  as standard of care and provided in depth information regarding 4 primary components of both Intensive and Self Management Phases, including Manual Lymph Drainage (MLD), compression wrapping and garments, skin care, and therapeutic exercise.   Pilar Plate discussion of high burden of care,  Discussed  Importance of daily, ongoing LE self-care essential to retaining clinical gains and limiting progression.  Lastly, reviewed lymphedema precautions, including cellulitis risk and difficulty with wound healing. Provided printed Lymphedema Workbook for reference.In this case, need for consistent , daily Max caregiver assistance with compression wraps is essential for optimal clinical outcome.optimal clinical outcome.    Person(s) Educated  Patient    Methods  Explanation;Demonstration;Handout    Comprehension  Verbalized understanding;Returned demonstration;Need further instruction          OT Long Term Goals - 06/06/19 1634      OT LONG TERM GOAL #1    Title  Pt will be able to apply BLE, knee length, multi-layer, short stretch compression wraps daily to one leg at a time using correct gradient techniques with MAXIMUM CAREGIVER ASSISTANCE   to achieve optimal limb volume reduction, to return affected limb/s, as closely as possible, to premorbid size and shape, to limit infection risk, and to improve safe functional ambulation and mobility.    Baseline  Dependent    Time  4    Period  Days    Status  New      OT LONG TERM GOAL #2   Title  Pt will be able to verbalize signs and symptoms of cellulitis infection and identify at least 4 common lymphedema precautions using printed resource for reference to limit LE progression over time to  limit risk of infection and LE exacerbation.    Baseline  Max A    Time  4    Period  Days    Status  New      OT LONG TERM GOAL #3   Title  Pt will tolerate knee length,  multilayer short stretch compression wrap to single leg up to 23/ 7 to achieve optimal swelling reduction to arrest lymphedema progression and in preparation for fitting appropriate compression garments/ devices.    Baseline  Max A    Time  6    Period  Days    Status  New      OT LONG TERM GOAL #4   Title  With max CG support daily for compression wrapping  Pt will achieve and sustain at least  85%  compliance with daily LE self-care home program (skin care, lymphatic pumping therex, compression and simple self-MLD) to reduce and control limb swelling, reduce infection risk and limit LE progression.    Baseline  Dependent    Time  12    Period  Weeks    Status  New      OT LONG TERM GOAL #5   Title  Pt will achieve at least 10% limb volume reduction in RLE and 5% reduction in LLE belowthe knees to reduce recurrent leg ulcers, reduce infection risk and limit LE progression.    Baseline  dependent    Time  12    Period  Weeks    Status  New            Plan - 06/06/19 1606    Clinical Impression Statement  Laverta Harnisch is an  79 year old female presenting with moderate, stage II BLE phlebo-lymphedema (LE) L> secondary to venous disease and exacerbated by morbid obesity. Systemic conditions contributing to lymphatic overload include CKD, DM, HTNOSA and OA. Soft tissue and orthopedic trauma resulting from frequent falls also exacerbates lymphedema, and in turn BLE LE contributes to slow wound healing and increased infection risk. LE limits Ms. Riess' functional ambulation and transfers and limits her ability to perform functional activities in all occupational domains, including basic and instrumental ADLs, work and productive activities, leisure pursuits and social participation. LE negatively impacts body image as evidenced by disguising swelling with clothing that covers her legs. Ms. Feeback will benefit from skilled Occupational Therapy for Intensive and Management Phase Complete Decongestive Therapy (CDT) to include manual lymphatic drainage, skin care, therapeutic exercise and compression therapy. Emphasis throughout OT course will focus on Pt and family education for long term LE self-care at home. . Because Ms. Reith is unable to reach her legs to don and doff gradient compression bandages maximum and ongoing caregiver assistance is essential between OT Rx visits for clinical success and to retain clinical gains over time. Without consistent CG assistance Pt's prognosis is guarded at best. Without skilled OT for CDT LE will worsen and further functional decline is expected.    OT Occupational Profile and History  Comprehensive Assessment- Review of records and extensive additional review of physical, cognitive, psychosocial history related to current functional performance    Occupational performance deficits (Please refer to evaluation for details):  ADL's;Leisure;Social Participation;IADL's;Work    Body Structure / Function / Physical Skills  ADL;Decreased knowledge of precautions;Flexibility;ROM;Mobility;Decreased knowledge  of use of DME;Balance;Skin integrity;Wound;Edema;Pain;IADL    Rehab Potential  Good    Clinical Decision Making  Several treatment options, min-mod task modification necessary    Comorbidities Affecting Occupational Performance:  Presence of comorbidities impacting occupational performance    Comorbidities impacting occupational performance description:  see SUBJECTIVE for contibuting comorbidities    Modification or Assistance to Complete Evaluation   Max significant modification of tasks or assist is necessary to complete    OT Frequency  2x / week   2-3 x weekly x 12 weeks and prn   OT Duration  12 weeks   and PRN   OT Treatment/Interventions  Self-care/ADL training;Therapeutic exercise;Manual lymph drainage;Compression bandaging;Patient/family education;Other (comment);Therapeutic activities;Manual Therapy;DME and/or AE instruction   SKIN CARE   Plan  CDT: MLD, gradient wraps, ther ex, skin care,    OT Home Exercise Plan  Caregiver to apply compression wraps daily during visit intervals and assist with donning / doffing compression consistently    Recommended Other Services  Consider Velcro-style, adjustable , knee length [place]st, CircAid cmpression wraps as an alternative to traditional elastic stockings for ease of donning    Consulted and Agree with Plan of Care  Patient       Patient will benefit from skilled therapeutic intervention in order to improve the following deficits and impairments:   Body Structure / Function / Physical Skills: ADL, Decreased knowledge of precautions, Flexibility, ROM, Mobility, Decreased knowledge of use of DME, Balance, Skin integrity, Wound, Edema, Pain, IADL       Visit Diagnosis: Lymphedema, not elsewhere classified - Plan: Ot plan of care cert/re-cert    Problem List Patient Active Problem List   Diagnosis Date Noted  . GERD (gastroesophageal reflux disease) 01/20/2019  . Hyperlipidemia 01/20/2019  . Claudication ([place]) 01/07/2019  . Venous  ulcer of left leg ([place]) 08/13/2018  . Diabetic polyneuropathy associated with type 2 diabetes mellitus ([place]) 05/30/2018  . Macrocytosis 04/26/2018  . Fecal occult blood test positive 10/08/2017  . IDA (iron deficiency anemia) 10/08/2017  . Chronic venous insufficiency 12/25/2016  . Lymphedema 12/25/2016  . Diabetes ([place]) 12/25/2016  . Essential hypertension 12/25/2016  . Hx of adenomatous colonic polyps 10/22/2016  . DOE (dyspnea on exertion) 07/22/2016  . CKD (chronic kidney disease) stage 3, GFR 30-59 ml/min 06/23/2016  . Swelling of limb 12/31/2015  . Severe obesity (BMI >= 40) ([place]) 12/31/2015  . Varicose veins of both legs with edema 12/31/2015  . VV (varicose veins) 11/27/2015  . Other iron deficiency anemia 10/08/2015  . Anemia due to stage 3 chronic kidney disease 10/08/2015  . BMI 45.0-49.9, adult ([place]) 10/05/2015  . OSA on CPAP 03/10/2015  . Lumbar stenosis with neurogenic claudication 09/30/2011    Andrey Spearman, MS, OTR/L, [facility] 06/06/19 4:41 PM  [place] MAIN [facility] SERVICES [address] [place], [place], [address] Phone: [phone]   Fax:  [phone]  Name: LEO WEYANDT MRN: [id number] Date of Birth: Aug 05, 1940

## 2019-06-13 ENCOUNTER — Other Ambulatory Visit: Payer: Self-pay

## 2019-06-13 ENCOUNTER — Encounter (INDEPENDENT_AMBULATORY_CARE_PROVIDER_SITE_OTHER): Payer: Medicare HMO

## 2019-06-13 ENCOUNTER — Ambulatory Visit: Payer: Medicare HMO | Attending: Nurse Practitioner | Admitting: Occupational Therapy

## 2019-06-13 DIAGNOSIS — I890 Lymphedema, not elsewhere classified: Secondary | ICD-10-CM | POA: Insufficient documentation

## 2019-06-13 NOTE — Therapy (Signed)
[place] MAIN [facility] SERVICES [address] [place], [place], [address] Phone: [phone]   Fax:  [phone]  Occupational Therapy Treatment  Patient Details  Name: Sara Bright MRN: [id number] Date of Birth: 1940-06-18 Referring Provider (OT): Eulogio Ditch, [place]   Encounter Date: 06/13/2019  OT End of Session - 06/13/19 1202    Visit Number  2    Number of Visits  36    Date for OT Re-Evaluation  09/03/19    OT Start Time  1000    OT Stop Time  1100    OT Time Calculation (min)  60 min    Activity Tolerance  Patient tolerated treatment well;No increased pain    Behavior During Therapy  WFL for tasks assessed/performed       Past Medical History:  Diagnosis Date  . Adenoma of colon   . Adenomatous colon polyp   . Anemia    IDA  . Cataract   . Cervical stenosis of spinal canal   . Chronic kidney disease    stage 3  . Diabetes mellitus without complication ([place])   . GERD (gastroesophageal reflux disease)   . Glaucoma   . Heme positive stool   . History of UTI   . Hx of gallstones   . Hyperkalemia   . Hyperlipidemia   . Hyperlipidemia   . Hypertension   . Lumbar stenosis   . Lymphedema   . Neuromuscular disorder ([place])   . Neuropathy associated with endocrine disorder ([place])   . Obesity   . OSA on CPAP    on CPAP  . Osteoarthritis   . Osteoarthritis   . Port-A-Cath in place   . Retinopathy due to secondary diabetes ([place])   . Secondary hyperparathyroidism of renal origin [facility])     Past Surgical History:  Procedure Laterality Date  . ACDF X2    . BACK SURGERY    . Cataract extraction right    . catarect extraction left    . CHOLECYSTECTOMY    . COLONOSCOPY WITH PROPOFOL N/A 12/02/2016   Procedure: COLONOSCOPY WITH PROPOFOL;  Surgeon: Manya Silvas, MD;  Location[facility] ENDOSCOPY;  Service: Endoscopy;  Laterality: N/A;  . COLONOSCOPY WITH PROPOFOL N/A 11/10/2017   Procedure: COLONOSCOPY WITH PROPOFOL;  Surgeon:  Toledo, Benay Pike, MD;  Location: [facility] ENDOSCOPY;  Service: Gastroenterology;  Laterality: N/A;  . colonoscopy with removal lesions by snare    . Endoscoic carpal tunnel release    . ESOPHAGOGASTRODUODENOSCOPY N/A 11/10/2017   Procedure: ESOPHAGOGASTRODUODENOSCOPY (EGD);  Surgeon: Toledo, Benay Pike, MD;  Location: [facility] ENDOSCOPY;  Service: Gastroenterology;  Laterality: N/A;  . ESOPHAGOGASTRODUODENOSCOPY (EGD) WITH PROPOFOL N/A 12/02/2016   Procedure: ESOPHAGOGASTRODUODENOSCOPY (EGD) WITH PROPOFOL;  Surgeon: Manya Silvas, MD;  Location: [facility] ENDOSCOPY;  Service: Endoscopy;  Laterality: N/A;  . IR IMAGING GUIDED PORT INSERTION  10/01/2017  . Laminectomy posterior lumbar facetectomy and formaninotomy w/Decomp    . Laminectony posterior cervicle decomp w/Facectomy and foraminotomy    . VEIN LIGATION AND STRIPPING Left     There were no vitals filed for this visit.  Subjective Assessment - 06/13/19 1016    Subjective   Sara Bright presents for OT visit 2/36 to address BLE lymphedema. She is accompanied by her adut daughter, Sara Bright, who is here to assist with learning cmpression wraps.Ptreportslegpainisunchangedfrominitialevaluation.    Patient is accompanied by:  Family member    Pertinent History  Chronic leg swelling and associated pain > 20 years; ,  Hx recurrent  ulcers, hx of lymphorrhea, Contributing factors include CKD, stage 3, HTN, Obesity, OSA (has cpap), OA. Other pertinent medical hx include B neuropathy hands and feet, hx vein ligation and stripping s/p 5-6 yrs, cervicle and lumbar laminectomies, glaucoma and diabetic retinopathy    Limitations  BLE weakness, impaired balance, impaired sensation hands and feet, impaired vision, increased falls risk, decreased hip AROM, claudication    Repetition  Increases Symptoms    Special Tests  +Stemmer base of toes bilaterally    Patient Stated Goals  Want to stop these leg ulcers from returning, Get leg swelling better under control and keep  symptoms from getting worse.    Pain Onset  Other (comment)   >20 YEARS         LYMPHEDEMA/ONCOLOGY QUESTIONNAIRE - 06/13/19 1158      Right Lower Extremity Lymphedema   Other  LLE A-D ( ankle to knee) limb volume measures 4702.59 ml.    Other  Initial limb volume differential measures 14.93%, R>L.      Left Lower Extremity Lymphedema   Other  Initial RLE A-D ( ankle to knee) limb volume measures 4000.32 ml              OT Treatments/Exercises (OP) - 06/13/19 0001      ADLs   ADL Education Given  Yes      Manual Therapy   Manual Therapy  Edema management;Compression Bandaging    Edema Management  InitialBLE comparative limb  volumetrics    Compression Bandaging  RLE gradient compression wraps using 8, 10,and 12 cm short stretch wrapsover single layer of stockinett and 0.4 cm Rosidal foam. Knee length, excluding toes             OT Education - 06/13/19 1201    Education Details  Pt and family CG edu for lymphedema sef care w emphasis on correct application of multilayer short stretch wrapsusing gradient techniques    Person(s) Educated  Patient;Child(ren)    Methods  Explanation;Demonstration;Handout    Comprehension  Verbalized understanding;Returned demonstration;Need further instruction          OT Long Term Goals - 06/06/19 1634      OT LONG TERM GOAL #1   Title  Pt will be able to apply BLE, knee length, multi-layer, short stretch compression wraps daily to one leg at a time using correct gradient techniques with MAXIMUM CAREGIVER ASSISTANCE   to achieve optimal limb volume reduction, to return affected limb/s, as closely as possible, to premorbid size and shape, to limit infection risk, and to improve safe functional ambulation and mobility.    Baseline  Dependent    Time  4    Period  Days    Status  New      OT LONG TERM GOAL #2   Title  Pt will be able to verbalize signs and symptoms of cellulitis infection and identify at least 4 common  lymphedema precautions using printed resource for reference to limit LE progression over time to limit risk of infection and LE exacerbation.    Baseline  Max A    Time  4    Period  Days    Status  New      OT LONG TERM GOAL #3   Title  Pt will tolerate knee length,  multilayer short stretch compression wrap to single leg up to 23/ 7 to achieve optimal swelling reduction to arrest lymphedema progression and in preparation for fitting appropriate compression garments/  devices.    Baseline  Max A    Time  6    Period  Days    Status  New      OT LONG TERM GOAL #4   Title  With max CG support daily for compression wrapping  Pt will achieve and sustain at least  85%  compliance with daily LE self-care home program (skin care, lymphatic pumping therex, compression and simple self-MLD) to reduce and control limb swelling, reduce infection risk and limit LE progression.    Baseline  Dependent    Time  12    Period  Weeks    Status  New      OT LONG TERM GOAL #5   Title  Pt will achieve at least 10% limb volume reduction in RLE and 5% reduction in LLE belowthe knees to reduce recurrent leg ulcers, reduce infection risk and limit LE progression.    Baseline  dependent    Time  12    Period  Weeks    Status  New            Plan - 06/13/19 1204    Clinical Impression Statement  Initial comparative limb volumetrics reveal a limb volume diffierential (LVD) of 14.93%, R>L. Skin beneath Unna boot on LLE is well healed, well hydrated and mobile.Skin is darkly hyperpigmented with hemociderine stain. Pt denies pain with palpation while washing medication from skin. Emphasis of session was on teaching Pt and daughter how to apply gradient compression wraps to LLE.After skiled teaching daughter able to apply wraps using correct techniques with Max A. Pt has significant difficulty with transfer from transport chair to Rx bed and back, with bed mobility and bending hips and knees to reposition during  wrapping. Pt would benefit from course of PT for LE strengthening and balance training once OT for LE care is complete to limit falls risk and increase safe functional mobility and ambulation. Cont as per POC    OT Occupational Profile and History  Comprehensive Assessment- Review of records and extensive additional review of physical, cognitive, psychosocial history related to current functional performance    Occupational performance deficits (Please refer to evaluation for details):  ADL's;Leisure;Social Participation;IADL's;Work    Body Structure / Function / Physical Skills  ADL;Decreased knowledge of precautions;Flexibility;ROM;Mobility;Decreased knowledge of use of DME;Balance;Skin integrity;Wound;Edema;Pain;IADL    Rehab Potential  Good    Clinical Decision Making  Several treatment options, min-mod task modification necessary    Comorbidities Affecting Occupational Performance:  Presence of comorbidities impacting occupational performance    Comorbidities impacting occupational performance description:  see SUBJECTIVE for contibuting comorbidities    Modification or Assistance to Complete Evaluation   Max significant modification of tasks or assist is necessary to complete    OT Frequency  2x / week   2-3 x weekly x 12 weeks and prn   OT Duration  12 weeks   and PRN   OT Treatment/Interventions  Self-care/ADL training;Therapeutic exercise;Manual lymph drainage;Compression bandaging;Patient/family education;Other (comment);Therapeutic activities;Manual Therapy;DME and/or AE instruction   SKIN CARE   Plan  CDT: MLD, gradient wraps, ther ex, skin care,    OT Home Exercise Plan  Caregiver to apply compression wraps daily during visit intervals and assist with donning / doffing compression consistently    Recommended Other Services  Consider Velcro-style, adjustable , knee length Jobst, CircAid cmpression wraps as an alternative to traditional elastic stockings for ease of donning    Consulted  and Agree with Plan of Care  Patient       Patient will benefit from skilled therapeutic intervention in order to improve the following deficits and impairments:   Body Structure / Function / Physical Skills: ADL, Decreased knowledge of precautions, Flexibility, ROM, Mobility, Decreased knowledge of use of DME, Balance, Skin integrity, Wound, Edema, Pain, IADL       Visit Diagnosis: Lymphedema, not elsewhere classified    Problem List Patient Active Problem List   Diagnosis Date Noted  . GERD (gastroesophageal reflux disease) 01/20/2019  . Hyperlipidemia 01/20/2019  . Claudication ([place]) 01/07/2019  . Venous ulcer of left leg ([place]) 08/13/2018  . Diabetic polyneuropathy associated with type 2 diabetes mellitus ([place]) 05/30/2018  . Macrocytosis 04/26/2018  . Fecal occult blood test positive 10/08/2017  . IDA (iron deficiency anemia) 10/08/2017  . Chronic venous insufficiency 12/25/2016  . Lymphedema 12/25/2016  . Diabetes ([place]) 12/25/2016  . Essential hypertension 12/25/2016  . Hx of adenomatous colonic polyps 10/22/2016  . DOE (dyspnea on exertion) 07/22/2016  . CKD (chronic kidney disease) stage 3, GFR 30-59 ml/min 06/23/2016  . Swelling of limb 12/31/2015  . Severe obesity (BMI >= 40) ([place]) 12/31/2015  . Varicose veins of both legs with edema 12/31/2015  . VV (varicose veins) 11/27/2015  . Other iron deficiency anemia 10/08/2015  . Anemia due to stage 3 chronic kidney disease 10/08/2015  . BMI 45.0-49.9, adult ([place]) 10/05/2015  . OSA on CPAP 03/10/2015  . Lumbar stenosis with neurogenic claudication 09/30/2011    Andrey Spearman, MS, OTR/L, [facility] 06/13/19 12:11 PM   [place] MAIN [facility] SERVICES [address] [place], [place], [address] Phone: [phone]   Fax:  [phone]  Name: Sara Bright MRN: [id number] Date of Birth: 1940-05-29

## 2019-06-14 ENCOUNTER — Other Ambulatory Visit: Payer: Self-pay

## 2019-06-14 ENCOUNTER — Ambulatory Visit: Payer: Medicare HMO | Admitting: Occupational Therapy

## 2019-06-14 DIAGNOSIS — I890 Lymphedema, not elsewhere classified: Secondary | ICD-10-CM | POA: Diagnosis not present

## 2019-06-14 NOTE — Therapy (Signed)
[place] MAIN [facility] SERVICES [address] [place], [place], [address] Phone: [phone]   Fax:  [phone]  Occupational Therapy Treatment  Patient Details  Name: Sara Bright MRN: [id number] Date of Birth: 09-21-1940 Referring Provider (OT): Eulogio Ditch, [place]   Encounter Date: 06/14/2019  OT End of Session - 06/14/19 1126    Visit Number  3    Number of Visits  36    Date for OT Re-Evaluation  09/03/19    OT Start Time  1000    OT Stop Time  1100    OT Time Calculation (min)  60 min    Activity Tolerance  Patient tolerated treatment well;No increased pain    Behavior During Therapy  WFL for tasks assessed/performed       Past Medical History:  Diagnosis Date  . Adenoma of colon   . Adenomatous colon polyp   . Anemia    IDA  . Cataract   . Cervical stenosis of spinal canal   . Chronic kidney disease    stage 3  . Diabetes mellitus without complication ([place])   . GERD (gastroesophageal reflux disease)   . Glaucoma   . Heme positive stool   . History of UTI   . Hx of gallstones   . Hyperkalemia   . Hyperlipidemia   . Hyperlipidemia   . Hypertension   . Lumbar stenosis   . Lymphedema   . Neuromuscular disorder ([place])   . Neuropathy associated with endocrine disorder ([place])   . Obesity   . OSA on CPAP    on CPAP  . Osteoarthritis   . Osteoarthritis   . Port-A-Cath in place   . Retinopathy due to secondary diabetes ([place])   . Secondary hyperparathyroidism of renal origin [facility])     Past Surgical History:  Procedure Laterality Date  . ACDF X2    . BACK SURGERY    . Cataract extraction right    . catarect extraction left    . CHOLECYSTECTOMY    . COLONOSCOPY WITH PROPOFOL N/A 12/02/2016   Procedure: COLONOSCOPY WITH PROPOFOL;  Surgeon: Manya Silvas, MD;  Location: [facility] ENDOSCOPY;  Service: Endoscopy;  Laterality: N/A;  . COLONOSCOPY WITH PROPOFOL N/A 11/10/2017   Procedure: COLONOSCOPY WITH PROPOFOL;  Surgeon:  Toledo, Benay Pike, MD;  Location: [facility] ENDOSCOPY;  Service: Gastroenterology;  Laterality: N/A;  . colonoscopy with removal lesions by snare    . Endoscoic carpal tunnel release    . ESOPHAGOGASTRODUODENOSCOPY N/A 11/10/2017   Procedure: ESOPHAGOGASTRODUODENOSCOPY (EGD);  Surgeon: Toledo, Benay Pike, MD;  Location: [facility] ENDOSCOPY;  Service: Gastroenterology;  Laterality: N/A;  . ESOPHAGOGASTRODUODENOSCOPY (EGD) WITH PROPOFOL N/A 12/02/2016   Procedure: ESOPHAGOGASTRODUODENOSCOPY (EGD) WITH PROPOFOL;  Surgeon: Manya Silvas, MD;  [facility] ENDOSCOPY;  Service: Endoscopy;  Laterality: N/A;  . IR IMAGING GUIDED PORT INSERTION  10/01/2017  . Laminectomy posterior lumbar facetectomy and formaninotomy w/Decomp    . Laminectony posterior cervicle decomp w/Facectomy and foraminotomy    . VEIN LIGATION AND STRIPPING Left     There were no vitals filed for this visit.  Subjective Assessment - 06/14/19 1119    Subjective   Sara Bright presents for OT visit 3/36 to address BLE lymphedema. She is accompanied by her adut daughter, Sara Bright, who is is assisting her mom with applying compression wraps between visits.Pt presents with knee length LLE compression wraps applied yesterday in place. She reports good tolerance over last 24 hours. Pt expresses concern re  inconveniencing her daughter during visit intervals.Pt, daughter and OT had a frank discussion re optimal clinical outcome for lymphedema care, and perhaps preventing future wounds and possible diabetic complications, is to accept help now to ensure her highest level of functional independence going forward.    Patient is accompanied by:  Family member    Pertinent History  Chronic leg swelling and associated pain > 20 years; , Hx recurrent  ulcers, hx of lymphorrhea, Contributing factors include CKD, stage 3, HTN, Obesity, OSA (has cpap), OA. Other pertinent medical hx include B neuropathy hands and feet, hx vein ligation and stripping s/p 5-6 yrs,  cervicle and lumbar laminectomies, glaucoma and diabetic retinopathy    Limitations  BLE weakness, impaired balance, impaired sensation hands and feet, impaired vision, increased falls risk, decreased hip AROM, claudication    Repetition  Increases Symptoms    Special Tests  +Stemmer base of toes bilaterally    Patient Stated Goals  Want to stop these leg ulcers from returning, Get leg swelling better under control and keep symptoms from getting worse.    Pain Onset  Other (comment)   >20 YEARS                  OT Treatments/Exercises (OP) - 06/14/19 0001      ADLs   ADL Education Given  Yes      Manual Therapy   Manual Therapy  Edema management;Compression Bandaging    Compression Bandaging  Added single roll of 12 cm wide Artiflex cast padding at distal leg under Rosidal to reduce discomfort at narrowest ankle when wraps tend to slide down and bunch up. RLE gradient compression wraps using 8, 10,and 12 cm short stretch wrapsover single layer of stockinett and 0.4 cm Rosidal foam. Knee length, excluding toes             OT Education - 06/14/19 1125    Education Details  Pt and family CG edu for lymphedema sef care w emphasis on correct application of multilayer short stretch wrapsusing gradient techniques. pT/family edu re alternative, Velcro-style, short stretch, adjustable , daytime compression garments/devices.    Person(s) Educated  Patient;Child(ren)    Methods  Explanation;Demonstration;Handout    Comprehension  Verbalized understanding;Returned demonstration;Need further instruction          OT Long Term Goals - 06/06/19 1634      OT LONG TERM GOAL #1   Title  Pt will be able to apply BLE, knee length, multi-layer, short stretch compression wraps daily to one leg at a time using correct gradient techniques with MAXIMUM CAREGIVER ASSISTANCE   to achieve optimal limb volume reduction, to return affected limb/s, as closely as possible, to premorbid size and  shape, to limit infection risk, and to improve safe functional ambulation and mobility.    Baseline  Dependent    Time  4    Period  Days    Status  New      OT LONG TERM GOAL #2   Title  Pt will be able to verbalize signs and symptoms of cellulitis infection and identify at least 4 common lymphedema precautions using printed resource for reference to limit LE progression over time to limit risk of infection and LE exacerbation.    Baseline  Max A    Time  4    Period  Days    Status  New      OT LONG TERM GOAL #3   Title  Pt will tolerate knee  length,  multilayer short stretch compression wrap to single leg up to 23/ 7 to achieve optimal swelling reduction to arrest lymphedema progression and in preparation for fitting appropriate compression garments/ devices.    Baseline  Max A    Time  6    Period  Days    Status  New      OT LONG TERM GOAL #4   Title  With max CG support daily for compression wrapping  Pt will achieve and sustain at least  85%  compliance with daily LE self-care home program (skin care, lymphatic pumping therex, compression and simple self-MLD) to reduce and control limb swelling, reduce infection risk and limit LE progression.    Baseline  Dependent    Time  12    Period  Weeks    Status  New      OT LONG TERM GOAL #5   Title  Pt will achieve at least 10% limb volume reduction in RLE and 5% reduction in LLE belowthe knees to reduce recurrent leg ulcers, reduce infection risk and limit LE progression.    Baseline  dependent    Time  12    Period  Weeks    Status  New            Plan - 06/14/19 1126    Clinical Impression Statement  Pt tolerated knee length, gradient compression wrap to LLE for 24 hours since applied. Goal met! When wraps removed leg below the knee is very well decongested this morning. Foot swelling remains moderate. Emphasis of visit was on Pt and family edu to perfect gradient compression wrap techniques with Pt and caregiver. By end  of session Pt was directing her daughter correctly with wrapping steps and she was spontaneously repositioning limb to assist without cues. Sara Bright was able to apply wraps   using correct techniques with mod assist today after review and demo. Provided 2nd set of wraps today. Provided demonstration and teaching re rational and pros and cons of CircAid Juxtafit Essential velcro-style, adjustable, daytime compression device. We are exploring this option due to difficulty reaching feet and with hand strenth needed to don and doff traditinaol elastic stockings. By end of session Pt was able to reposition herself to foff garment. She performed all transfers with extra time today and without assistive devices or hands on assistance. Good progress towards all goals . Cont as per POC.    OT Occupational Profile and History  Comprehensive Assessment- Review of records and extensive additional review of physical, cognitive, psychosocial history related to current functional performance    Occupational performance deficits (Please refer to evaluation for details):  ADL's;Leisure;Social Participation;IADL's;Work    Body Structure / Function / Physical Skills  ADL;Decreased knowledge of precautions;Flexibility;ROM;Mobility;Decreased knowledge of use of DME;Balance;Skin integrity;Wound;Edema;Pain;IADL    Rehab Potential  Good    Clinical Decision Making  Several treatment options, min-mod task modification necessary    Comorbidities Affecting Occupational Performance:  Presence of comorbidities impacting occupational performance    Comorbidities impacting occupational performance description:  see SUBJECTIVE for contibuting comorbidities    Modification or Assistance to Complete Evaluation   Max significant modification of tasks or assist is necessary to complete    OT Frequency  2x / week   2-3 x weekly x 12 weeks and prn   OT Duration  12 weeks   and PRN   OT Treatment/Interventions  Self-care/ADL  training;Therapeutic exercise;Manual lymph drainage;Compression bandaging;Patient/family education;Other (comment);Therapeutic activities;Manual Therapy;DME and/or AE instruction  SKIN CARE   Plan  CDT: MLD, gradient wraps, ther ex, skin care,    OT Home Exercise Plan  Caregiver to apply compression wraps daily during visit intervals and assist with donning / doffing compression consistently    Recommended Other Services  Consider Velcro-style, adjustable , knee length Jobst, CircAid cmpression wraps as an alternative to traditional elastic stockings for ease of donning    Consulted and Agree with Plan of Care  Patient       Patient will benefit from skilled therapeutic intervention in order to improve the following deficits and impairments:   Body Structure / Function / Physical Skills: ADL, Decreased knowledge of precautions, Flexibility, ROM, Mobility, Decreased knowledge of use of DME, Balance, Skin integrity, Wound, Edema, Pain, IADL       Visit Diagnosis: Lymphedema, not elsewhere classified    Problem List Patient Active Problem List   Diagnosis Date Noted  . GERD (gastroesophageal reflux disease) 01/20/2019  . Hyperlipidemia 01/20/2019  . Claudication ([place]) 01/07/2019  . Venous ulcer of left leg ([place]) 08/13/2018  . Diabetic polyneuropathy associated with type 2 diabetes mellitus ([place]) 05/30/2018  . Macrocytosis 04/26/2018  . Fecal occult blood test positive 10/08/2017  . IDA (iron deficiency anemia) 10/08/2017  . Chronic venous insufficiency 12/25/2016  . Lymphedema 12/25/2016  . Diabetes ([place]) 12/25/2016  . Essential hypertension 12/25/2016  . Hx of adenomatous colonic polyps 10/22/2016  . DOE (dyspnea on exertion) 07/22/2016  . CKD (chronic kidney disease) stage 3, GFR 30-59 ml/min 06/23/2016  . Swelling of limb 12/31/2015  . Severe obesity (BMI >= 40) ([place]) 12/31/2015  . Varicose veins of both legs with edema 12/31/2015  . VV (varicose veins) 11/27/2015  . Other  iron deficiency anemia 10/08/2015  . Anemia due to stage 3 chronic kidney disease 10/08/2015  . BMI 45.0-49.9, adult ([place]) 10/05/2015  . OSA on CPAP 03/10/2015  . Lumbar stenosis with neurogenic claudication 09/30/2011   Andrey Spearman, MS, OTR/L, [facility] 06/14/19 11:35 AM   [place] MAIN [facility] SERVICES [address] [place], [place], [address] Phone: [phone]   Fax:  [phone]  Name: Sara Bright MRN: [id number] Date of Birth: 26-Sep-1940

## 2019-06-16 ENCOUNTER — Encounter (INDEPENDENT_AMBULATORY_CARE_PROVIDER_SITE_OTHER): Payer: Self-pay | Admitting: Nurse Practitioner

## 2019-06-16 NOTE — Progress Notes (Signed)
History of Present Illness  There is no documented history at this time  Assessments & Plan   1. Venous ulcer of left leg ([place])      Additional instructions  Subjective:  Patient presents with venous ulcer of the Left lower extremity.    Procedure:  3 layer unna wrap was placed Left lower extremity.   Plan:   Follow up in one week.

## 2019-06-16 NOTE — Progress Notes (Signed)
error 

## 2019-06-19 ENCOUNTER — Ambulatory Visit: Payer: Medicare HMO | Admitting: Occupational Therapy

## 2019-06-20 ENCOUNTER — Encounter (INDEPENDENT_AMBULATORY_CARE_PROVIDER_SITE_OTHER): Payer: Medicare HMO

## 2019-06-20 ENCOUNTER — Other Ambulatory Visit: Payer: Self-pay

## 2019-06-20 ENCOUNTER — Ambulatory Visit: Payer: Medicare HMO | Admitting: Occupational Therapy

## 2019-06-20 ENCOUNTER — Ambulatory Visit (INDEPENDENT_AMBULATORY_CARE_PROVIDER_SITE_OTHER): Payer: Medicare HMO | Admitting: Nurse Practitioner

## 2019-06-20 ENCOUNTER — Inpatient Hospital Stay: Payer: Medicare HMO | Attending: Hematology and Oncology

## 2019-06-20 ENCOUNTER — Inpatient Hospital Stay: Payer: Medicare HMO

## 2019-06-20 VITALS — BP 125/74 | HR 66 | Temp 95.9°F | Resp 16

## 2019-06-20 DIAGNOSIS — I129 Hypertensive chronic kidney disease with stage 1 through stage 4 chronic kidney disease, or unspecified chronic kidney disease: Secondary | ICD-10-CM | POA: Diagnosis not present

## 2019-06-20 DIAGNOSIS — Z79899 Other long term (current) drug therapy: Secondary | ICD-10-CM | POA: Insufficient documentation

## 2019-06-20 DIAGNOSIS — D631 Anemia in chronic kidney disease: Secondary | ICD-10-CM

## 2019-06-20 DIAGNOSIS — D7589 Other specified diseases of blood and blood-forming organs: Secondary | ICD-10-CM | POA: Diagnosis not present

## 2019-06-20 DIAGNOSIS — I890 Lymphedema, not elsewhere classified: Secondary | ICD-10-CM

## 2019-06-20 DIAGNOSIS — N183 Chronic kidney disease, stage 3 unspecified: Secondary | ICD-10-CM | POA: Insufficient documentation

## 2019-06-20 DIAGNOSIS — K219 Gastro-esophageal reflux disease without esophagitis: Secondary | ICD-10-CM | POA: Insufficient documentation

## 2019-06-20 DIAGNOSIS — M10072 Idiopathic gout, left ankle and foot: Secondary | ICD-10-CM | POA: Insufficient documentation

## 2019-06-20 DIAGNOSIS — E785 Hyperlipidemia, unspecified: Secondary | ICD-10-CM | POA: Diagnosis not present

## 2019-06-20 DIAGNOSIS — N1832 Chronic kidney disease, stage 3b: Secondary | ICD-10-CM

## 2019-06-20 LAB — HEMOGLOBIN AND HEMATOCRIT, BLOOD
HCT: 30.9 % — ABNORMAL LOW (ref 36.0–46.0)
Hemoglobin: 9.6 g/dL — ABNORMAL LOW (ref 12.0–15.0)

## 2019-06-20 MED ORDER — SODIUM CHLORIDE 0.9 % IV SOLN
Freq: Once | INTRAVENOUS | Status: DC
  Filled 2019-06-20: qty 250

## 2019-06-20 MED ORDER — EPOETIN ALFA-EPBX 10000 UNIT/ML IJ SOLN
10000.0000 [IU] | Freq: Once | INTRAMUSCULAR | Status: AC
  Administered 2019-06-20: 10:00:00 10000 [IU] via SUBCUTANEOUS

## 2019-06-20 MED ORDER — SODIUM CHLORIDE 0.9 % IV SOLN: 200.0000 mg | Freq: Once | INTRAVENOUS | Status: DC

## 2019-06-20 NOTE — Therapy (Signed)
[place] MAIN [facility] SERVICES [address] [place], [place], [address] Phone: [phone]   Fax:  [phone]  Occupational Therapy Treatment  Patient Details  Name: Sara Bright MRN: [id number] Date of Birth: [birth year] Referring Provider (OT): Sara Bright, [place]   Encounter Date: 06/20/2019  OT End of Session - 06/20/19 0843    Visit Number  4    Number of Visits  36    Date for OT Re-Evaluation  09/03/19    OT Start Time  0801    OT Stop Time  0833    OT Time Calculation (min)  32 min    Activity Tolerance  Patient tolerated treatment well;No increased pain    Behavior During Therapy  WFL for tasks assessed/performed       Past Medical History:  Diagnosis Date  . Adenoma of colon   . Adenomatous colon polyp   . Anemia    IDA  . Cataract   . Cervical stenosis of spinal canal   . Chronic kidney disease    stage 3  . Diabetes mellitus without complication ([place])   . GERD (gastroesophageal reflux disease)   . Glaucoma   . Heme positive stool   . History of UTI   . Hx of gallstones   . Hyperkalemia   . Hyperlipidemia   . Hyperlipidemia   . Hypertension   . Lumbar stenosis   . Lymphedema   . Neuromuscular disorder ([place])   . Neuropathy associated with endocrine disorder ([place])   . Obesity   . OSA on CPAP    on CPAP  . Osteoarthritis   . Osteoarthritis   . Port-A-Cath in place   . Retinopathy due to secondary diabetes ([place])   . Secondary hyperparathyroidism of renal origin [facility])     Past Surgical History:  Procedure Laterality Date  . ACDF X2    . BACK SURGERY    . Cataract extraction right    . catarect extraction left    . CHOLECYSTECTOMY    . COLONOSCOPY WITH PROPOFOL N/A 12/02/2016   Procedure: COLONOSCOPY WITH PROPOFOL;  Surgeon: Sara Silvas, MD;  Location: [facility] ENDOSCOPY;  Service: Endoscopy;  Laterality: N/A;  . COLONOSCOPY WITH PROPOFOL N/A 11/10/2017   Procedure: COLONOSCOPY WITH PROPOFOL;  Surgeon:  Toledo, Benay Pike, MD;  Location: [facility] ENDOSCOPY;  Service: Gastroenterology;  Laterality: N/A;  . colonoscopy with removal lesions by snare    . Endoscoic carpal tunnel release    . ESOPHAGOGASTRODUODENOSCOPY N/A 11/10/2017   Procedure: ESOPHAGOGASTRODUODENOSCOPY (EGD);  Surgeon: Toledo, Benay Pike, MD;  Location: [facility] ENDOSCOPY;  Service: Gastroenterology;  Laterality: N/A;  . ESOPHAGOGASTRODUODENOSCOPY (EGD) WITH PROPOFOL N/A 12/02/2016   Procedure: ESOPHAGOGASTRODUODENOSCOPY (EGD) WITH PROPOFOL;  Surgeon: Sara Silvas, MD;  Location: [facility] ENDOSCOPY;  Service: Endoscopy;  Laterality: N/A;  . IR IMAGING GUIDED PORT INSERTION  10/01/2017  . Laminectomy posterior lumbar facetectomy and formaninotomy w/Decomp    . Laminectony posterior cervicle decomp w/Facectomy and foraminotomy    . VEIN LIGATION AND STRIPPING Left     There were no vitals filed for this visit.  Subjective Assessment - 06/20/19 9622    Subjective   [name] presents for OT visit 4/36 to address BLE lymphedema. She is accompanied by her adut son in law, Sara Bright, who transports her to clinic. Pt reports 8/10 pain in her great toe that started a couple of days ago soon after commencing compression wrapping . Pt reports the pain has gotten progressively  worse, and is especially painful when elevating from weigh bearing.    Patient is accompanied by:  Family member    Pertinent History  Chronic leg swelling and associated pain > 20 years; , Hx recurrent  ulcers, hx of lymphorrhea, Contributing factors include CKD, stage 3, HTN, Obesity, OSA (has cpap), OA. Other pertinent medical hx include B neuropathy hands and feet, hx vein ligation and stripping s/p 5-6 yrs, cervicle and lumbar laminectomies, glaucoma and diabetic retinopathy    Limitations  BLE weakness, impaired balance, impaired sensation hands and feet, impaired vision, increased falls risk, decreased hip AROM, claudication    Repetition  Increases Symptoms    Special Tests   +Stemmer base of toes bilaterally    Patient Stated Goals  Want to stop these leg ulcers from returning, Get leg swelling better under control and keep symptoms from getting worse.    Pain Score  8     Pain Location  Toe (Comment which one)    Pain Orientation  Left   great toe met head   Pain Descriptors / Indicators  Tender;Shooting;Guarding;Pressure;Sore;Radiating;Discomfort;Penetrating;Grimacing;Stabbing    Pain Type  Acute pain    Pain Onset  In the past 7 days   >20 YEARS   Aggravating Factors   elevation , weight bearing    Multiple Pain Sites  No                   OT Treatments/Exercises (OP) - 06/20/19 0001      ADLs   ADL Education Given  Yes      Manual Therapy   Manual Therapy  Edema management             OT Education - 06/20/19 0839    Education Details  Pt and family edu for symptoms and causes of gout    Person(s) Educated  Patient;Child(ren)    Methods  Explanation;Demonstration    Comprehension  Verbalized understanding;Returned demonstration          OT Long Term Goals - 06/06/19 1634      OT LONG TERM GOAL #1   Title  Pt will be able to apply BLE, knee length, multi-layer, short stretch compression wraps daily to one leg at a time using correct gradient techniques with MAXIMUM CAREGIVER ASSISTANCE   to achieve optimal limb volume reduction, to return affected limb/s, as closely as possible, to premorbid size and shape, to limit infection risk, and to improve safe functional ambulation and mobility.    Baseline  Dependent    Time  4    Period  Days    Status  New      OT LONG TERM GOAL #2   Title  Pt will be able to verbalize signs and symptoms of cellulitis infection and identify at least 4 common lymphedema precautions using printed resource for reference to limit LE progression over time to limit risk of infection and LE exacerbation.    Baseline  Max A    Time  4    Period  Days    Status  New      OT LONG TERM GOAL #3    Title  Pt will tolerate knee length,  multilayer short stretch compression wrap to single leg up to 23/ 7 to achieve optimal swelling reduction to arrest lymphedema progression and in preparation for fitting appropriate compression garments/ devices.    Baseline  Max A    Time  6    Period  Days  Status  New      OT LONG TERM GOAL #4   Title  With max CG support daily for compression wrapping  Pt will achieve and sustain at least  85%  compliance with daily LE self-care home program (skin care, lymphatic pumping therex, compression and simple self-MLD) to reduce and control limb swelling, reduce infection risk and limit LE progression.    Baseline  Dependent    Time  12    Period  Weeks    Status  New      OT LONG TERM GOAL #5   Title  Pt will achieve at least 10% limb volume reduction in RLE and 5% reduction in LLE belowthe knees to reduce recurrent leg ulcers, reduce infection risk and limit LE progression.    Baseline  dependent    Time  12    Period  Weeks    Status  New            Plan - 06/20/19 0844    Clinical Impression Statement  Upon removing multilayered , short stretch, gradient compression wraps leg swelling is significantly reduced since last visit, but foot swelling is moderately increased from instep distally. Metatarsal head of the L great toe is red, warm and very tender to even feather light palpation. Pain and swelling in the great toe started suddenly a couple of days ago and has  gotten progressively worse,  keeping Pt awake all night. by report. Sudden onset, intense joint pain and inflammation suggest a gout flare. I believe onset at same time we commenced compression wrapping may be coincidental. pT AGREES WITH PLAN TO VISIT HER PRIMARY CARE PHYSICIAN TODAY AT 3:30 FOR diagnosis and treatment. We'll leave wraps off of the L leg and foot to reduce falls risk in the meantime. Pt OK'd to keep her upcoming appointment for LE care later this week unless her doctor  advises her to reschedule due to signs / symptoms of infection. Of course, we can also shift Rx to RLE  PRN. Cont as per POC. I am encouraged at the rapid clinical response to CDT thus far on the LLE.    OT Occupational Profile and History  Comprehensive Assessment- Review of records and extensive additional review of physical, cognitive, psychosocial history related to current functional performance    Occupational performance deficits (Please refer to evaluation for details):  ADL's;Leisure;Social Participation;IADL's;Work    Body Structure / Function / Physical Skills  ADL;Decreased knowledge of precautions;Flexibility;ROM;Mobility;Decreased knowledge of use of DME;Balance;Skin integrity;Wound;Edema;Pain;IADL    Rehab Potential  Good    Clinical Decision Making  Several treatment options, min-mod task modification necessary    Comorbidities Affecting Occupational Performance:  Presence of comorbidities impacting occupational performance    Comorbidities impacting occupational performance description:  see SUBJECTIVE for contibuting comorbidities    Modification or Assistance to Complete Evaluation   Max significant modification of tasks or assist is necessary to complete    OT Frequency  2x / week   2-3 x weekly x 12 weeks and prn   OT Duration  12 weeks   and PRN   OT Treatment/Interventions  Self-care/ADL training;Therapeutic exercise;Manual lymph drainage;Compression bandaging;Patient/family education;Other (comment);Therapeutic activities;Manual Therapy;DME and/or AE instruction   SKIN CARE   Plan  CDT: MLD, gradient wraps, ther ex, skin care,    OT Home Exercise Plan  Caregiver to apply compression wraps daily during visit intervals and assist with donning / doffing compression consistently    Recommended Other Services  Consider Velcro-style,  adjustable , knee length Jobst, CircAid cmpression wraps as an alternative to traditional elastic stockings for ease of donning    Consulted and  Agree with Plan of Care  Patient       Patient will benefit from skilled therapeutic intervention in order to improve the following deficits and impairments:   Body Structure / Function / Physical Skills: ADL, Decreased knowledge of precautions, Flexibility, ROM, Mobility, Decreased knowledge of use of DME, Balance, Skin integrity, Wound, Edema, Pain, IADL       Visit Diagnosis: Lymphedema, not elsewhere classified    Problem List Patient Active Problem List   Diagnosis Date Noted  . GERD (gastroesophageal reflux disease) 01/20/2019  . Hyperlipidemia 01/20/2019  . Claudication ([place]) 01/07/2019  . Venous ulcer of left leg ([place]) 08/13/2018  . Diabetic polyneuropathy associated with type 2 diabetes mellitus ([place]) 05/30/2018  . Macrocytosis 04/26/2018  . Fecal occult blood test positive 10/08/2017  . IDA (iron deficiency anemia) 10/08/2017  . Chronic venous insufficiency 12/25/2016  . Lymphedema 12/25/2016  . Diabetes ([place]) 12/25/2016  . Essential hypertension 12/25/2016  . Hx of adenomatous colonic polyps 10/22/2016  . DOE (dyspnea on exertion) 07/22/2016  . CKD (chronic kidney disease) stage 3, GFR 30-59 ml/min 06/23/2016  . Swelling of limb 12/31/2015  . Severe obesity (BMI >= 40) ([place]) 12/31/2015  . Varicose veins of both legs with edema 12/31/2015  . VV (varicose veins) 11/27/2015  . Other iron deficiency anemia 10/08/2015  . Anemia due to stage 3 chronic kidney disease 10/08/2015  . BMI 45.0-49.9, adult ([place]) 10/05/2015  . OSA on CPAP 03/10/2015  . Lumbar stenosis with neurogenic claudication 09/30/2011    Andrey Spearman, MS, OTR/L, [facility] 06/20/19 8:53 AM  [place] MAIN [facility] SERVICES [address] [place], [place], [address] Phone: [phone]   Fax:  [phone]  Name: Sara Bright MRN: [id number] Date of Birth: Mar 13, 1940

## 2019-06-20 NOTE — Patient Instructions (Signed)

## 2019-06-20 NOTE — Progress Notes (Signed)
Patient reports probable gout in her right foot/leg. She has an appointment with her MD this afternoon at 3pm.  We reviewed symptoms and causes of gout.  Patient was encouraged to drink 2-4 liters of water daily.  Patient verbalized understanding.

## 2019-06-22 ENCOUNTER — Ambulatory Visit: Payer: Medicare HMO | Admitting: Occupational Therapy

## 2019-06-22 ENCOUNTER — Other Ambulatory Visit: Payer: Self-pay

## 2019-06-22 DIAGNOSIS — I890 Lymphedema, not elsewhere classified: Secondary | ICD-10-CM | POA: Diagnosis not present

## 2019-06-22 NOTE — Therapy (Signed)
[place] MAIN [facility] SERVICES [address] [place], [place], [address] Phone: [phone]   Fax:  [phone]  Occupational Therapy Treatment  Patient Details  Name: Sara Bright MRN: [id number] Date of Birth: 19-Jan-1941 Referring Provider (OT): Eulogio Ditch, [place]   Encounter Date: 06/22/2019  OT End of Session - 06/22/19 1334    Visit Number  5    Number of Visits  36    Date for OT Re-Evaluation  09/03/19    OT Start Time  1000    OT Stop Time  1100    OT Time Calculation (min)  60 min    Activity Tolerance  Patient tolerated treatment well;No increased pain    Behavior During Therapy  WFL for tasks assessed/performed       Past Medical History:  Diagnosis Date  . Adenoma of colon   . Adenomatous colon polyp   . Anemia    IDA  . Cataract   . Cervical stenosis of spinal canal   . Chronic kidney disease    stage 3  . Diabetes mellitus without complication ([place])   . GERD (gastroesophageal reflux disease)   . Glaucoma   . Heme positive stool   . History of UTI   . Hx of gallstones   . Hyperkalemia   . Hyperlipidemia   . Hyperlipidemia   . Hypertension   . Lumbar stenosis   . Lymphedema   . Neuromuscular disorder ([place])   . Neuropathy associated with endocrine disorder ([place])   . Obesity   . OSA on CPAP    on CPAP  . Osteoarthritis   . Osteoarthritis   . Port-A-Cath in place   . Retinopathy due to secondary diabetes ([place])   . Secondary hyperparathyroidism of renal origin [facility])     Past Surgical History:  Procedure Laterality Date  . ACDF X2    . BACK SURGERY    . Cataract extraction right    . catarect extraction left    . CHOLECYSTECTOMY    . COLONOSCOPY WITH PROPOFOL N/A 12/02/2016   Procedure: COLONOSCOPY WITH PROPOFOL;  Surgeon: Manya Silvas, MD;  Location[facility] ENDOSCOPY;  Service: Endoscopy;  Laterality: N/A;  . COLONOSCOPY WITH PROPOFOL N/A 11/10/2017   Procedure: COLONOSCOPY WITH PROPOFOL;  Surgeon:  Toledo, Benay Pike, MD;  Location: [facility] ENDOSCOPY;  Service: Gastroenterology;  Laterality: N/A;  . colonoscopy with removal lesions by snare    . Endoscoic carpal tunnel release    . ESOPHAGOGASTRODUODENOSCOPY N/A 11/10/2017   Procedure: ESOPHAGOGASTRODUODENOSCOPY (EGD);  Surgeon: Toledo, Benay Pike, MD;  Location: [facility] ENDOSCOPY;  Service: Gastroenterology;  Laterality: N/A;  . ESOPHAGOGASTRODUODENOSCOPY (EGD) WITH PROPOFOL N/A 12/02/2016   Procedure: ESOPHAGOGASTRODUODENOSCOPY (EGD) WITH PROPOFOL;  Surgeon: Manya Silvas, MD;  Location: [facility] ENDOSCOPY;  Service: Endoscopy;  Laterality: N/A;  . IR IMAGING GUIDED PORT INSERTION  10/01/2017  . Laminectomy posterior lumbar facetectomy and formaninotomy w/Decomp    . Laminectony posterior cervicle decomp w/Facectomy and foraminotomy    . VEIN LIGATION AND STRIPPING Left     There were no vitals filed for this visit.  Subjective Assessment - 06/22/19 1005    Subjective   Sara Bright presents for OT visit 5/36 to address BLE lymphedema. Pt is unaccompanied today. She tells me that she saw a doc at her PCP's office and he dx her with gout ibn the R great toe. Pt has started meds and is feeling much better."I can walk much better now."  Patient is accompanied by:  Family member    Pertinent History  Chronic leg swelling and associated pain > 20 years; , Hx recurrent  ulcers, hx of lymphorrhea, Contributing factors include CKD, stage 3, HTN, Obesity, OSA (has cpap), OA. Other pertinent medical hx include B neuropathy hands and feet, hx vein ligation and stripping s/p 5-6 yrs, cervicle and lumbar laminectomies, glaucoma and diabetic retinopathy    Limitations  BLE weakness, impaired balance, impaired sensation hands and feet, impaired vision, increased falls risk, decreased hip AROM, claudication    Repetition  Increases Symptoms    Special Tests  +Stemmer base of toes bilaterally    Patient Stated Goals  Want to stop these leg ulcers from returning,  Get leg swelling better under control and keep symptoms from getting worse.    Pain Onset  In the past 7 days   >20 YEARS                               OT Long Term Goals - 06/06/19 1634      OT LONG TERM GOAL #1   Title  Pt will be able to apply BLE, knee length, multi-layer, short stretch compression wraps daily to one leg at a time using correct gradient techniques with MAXIMUM CAREGIVER ASSISTANCE   to achieve optimal limb volume reduction, to return affected limb/s, as closely as possible, to premorbid size and shape, to limit infection risk, and to improve safe functional ambulation and mobility.    Baseline  Dependent    Time  4    Period  Days    Status  New      OT LONG TERM GOAL #2   Title  Pt will be able to verbalize signs and symptoms of cellulitis infection and identify at least 4 common lymphedema precautions using printed resource for reference to limit LE progression over time to limit risk of infection and LE exacerbation.    Baseline  Max A    Time  4    Period  Days    Status  New      OT LONG TERM GOAL #3   Title  Pt will tolerate knee length,  multilayer short stretch compression wrap to single leg up to 23/ 7 to achieve optimal swelling reduction to arrest lymphedema progression and in preparation for fitting appropriate compression garments/ devices.    Baseline  Max A    Time  6    Period  Days    Status  New      OT LONG TERM GOAL #4   Title  With max CG support daily for compression wrapping  Pt will achieve and sustain at least  85%  compliance with daily LE self-care home program (skin care, lymphatic pumping therex, compression and simple self-MLD) to reduce and control limb swelling, reduce infection risk and limit LE progression.    Baseline  Dependent    Time  12    Period  Weeks    Status  New      OT LONG TERM GOAL #5   Title  Pt will achieve at least 10% limb volume reduction in RLE and 5% reduction in LLE belowthe  knees to reduce recurrent leg ulcers, reduce infection risk and limit LE progression.    Baseline  dependent    Time  12    Period  Weeks    Status  New  Plan - 06/22/19 1327    Clinical Impression Statement  L foot and toes are moderately swollen . IP of great toe is reddened , but less painful to the touch. Pt tolerated initial MLD to RLE with simultaneous skin care and some moderately deeper fibrosis techniques to distal leg without increased pain. No compression wraps today as Pt left wraps in her car. Cont as per POC.    OT Occupational Profile and History  Comprehensive Assessment- Review of records and extensive additional review of physical, cognitive, psychosocial history related to current functional performance    Occupational performance deficits (Please refer to evaluation for details):  ADL's;Leisure;Social Participation;IADL's;Work    Body Structure / Function / Physical Skills  ADL;Decreased knowledge of precautions;Flexibility;ROM;Mobility;Decreased knowledge of use of DME;Balance;Skin integrity;Wound;Edema;Pain;IADL    Rehab Potential  Good    Clinical Decision Making  Several treatment options, min-mod task modification necessary    Comorbidities Affecting Occupational Performance:  Presence of comorbidities impacting occupational performance    Comorbidities impacting occupational performance description:  see SUBJECTIVE for contibuting comorbidities    Modification or Assistance to Complete Evaluation   Max significant modification of tasks or assist is necessary to complete    OT Frequency  2x / week   2-3 x weekly x 12 weeks and prn   OT Duration  12 weeks   and PRN   OT Treatment/Interventions  Self-care/ADL training;Therapeutic exercise;Manual lymph drainage;Compression bandaging;Patient/family education;Other (comment);Therapeutic activities;Manual Therapy;DME and/or AE instruction   SKIN CARE   Plan  CDT: MLD, gradient wraps, ther ex, skin care,     OT Home Exercise Plan  Caregiver to apply compression wraps daily during visit intervals and assist with donning / doffing compression consistently    Recommended Other Services  Consider Velcro-style, adjustable , knee length Jobst, CircAid cmpression wraps as an alternative to traditional elastic stockings for ease of donning    Consulted and Agree with Plan of Care  Patient       Patient will benefit from skilled therapeutic intervention in order to improve the following deficits and impairments:   Body Structure / Function / Physical Skills: ADL, Decreased knowledge of precautions, Flexibility, ROM, Mobility, Decreased knowledge of use of DME, Balance, Skin integrity, Wound, Edema, Pain, IADL       Visit Diagnosis: Lymphedema, not elsewhere classified    Problem List Patient Active Problem List   Diagnosis Date Noted  . GERD (gastroesophageal reflux disease) 01/20/2019  . Hyperlipidemia 01/20/2019  . Claudication ([place]) 01/07/2019  . Venous ulcer of left leg ([place]) 08/13/2018  . Diabetic polyneuropathy associated with type 2 diabetes mellitus ([place]) 05/30/2018  . Macrocytosis 04/26/2018  . Fecal occult blood test positive 10/08/2017  . IDA (iron deficiency anemia) 10/08/2017  . Chronic venous insufficiency 12/25/2016  . Lymphedema 12/25/2016  . Diabetes ([place]) 12/25/2016  . Essential hypertension 12/25/2016  . Hx of adenomatous colonic polyps 10/22/2016  . DOE (dyspnea on exertion) 07/22/2016  . CKD (chronic kidney disease) stage 3, GFR 30-59 ml/min 06/23/2016  . Swelling of limb 12/31/2015  . Severe obesity (BMI >= 40) ([place]) 12/31/2015  . Varicose veins of both legs with edema 12/31/2015  . VV (varicose veins) 11/27/2015  . Other iron deficiency anemia 10/08/2015  . Anemia due to stage 3 chronic kidney disease 10/08/2015  . BMI 45.0-49.9, adult ([place]) 10/05/2015  . OSA on CPAP 03/10/2015  . Lumbar stenosis with neurogenic claudication 09/30/2011    Andrey Spearman, MS,  OTR/L, [facility] 06/22/19 1:35 PM  [place]  [facility] MAIN [facility] SERVICES [place], [place], [address] Phone: [phone]   Fax:  [phone]  Name: Sara Bright MRN: [id number] Date of Birth: [birth year]

## 2019-06-28 ENCOUNTER — Other Ambulatory Visit: Payer: Self-pay

## 2019-06-28 ENCOUNTER — Ambulatory Visit: Payer: Medicare HMO | Admitting: Occupational Therapy

## 2019-06-28 DIAGNOSIS — I890 Lymphedema, not elsewhere classified: Secondary | ICD-10-CM

## 2019-06-28 NOTE — Therapy (Signed)
[place] MAIN [facility] SERVICES [address] [place], [place], [address] Phone: [phone]   Fax:  [phone]  Occupational Therapy Treatment  Patient Details  Name: Sara Bright MRN: [id number] Date of Birth: Jul 24, 1940 Referring Provider (OT): Eulogio Ditch, [place]   Encounter Date: 06/28/2019    Past Medical History:  Diagnosis Date  . Adenoma of colon   . Adenomatous colon polyp   . Anemia    IDA  . Cataract   . Cervical stenosis of spinal canal   . Chronic kidney disease    stage 3  . Diabetes mellitus without complication ([place])   . GERD (gastroesophageal reflux disease)   . Glaucoma   . Heme positive stool   . History of UTI   . Hx of gallstones   . Hyperkalemia   . Hyperlipidemia   . Hyperlipidemia   . Hypertension   . Lumbar stenosis   . Lymphedema   . Neuromuscular disorder ([place])   . Neuropathy associated with endocrine disorder ([place])   . Obesity   . OSA on CPAP    on CPAP  . Osteoarthritis   . Osteoarthritis   . Port-A-Cath in place   . Retinopathy due to secondary diabetes ([place])   . Secondary hyperparathyroidism of renal origin [facility])     Past Surgical History:  Procedure Laterality Date  . ACDF X2    . BACK SURGERY    . Cataract extraction right    . catarect extraction left    . CHOLECYSTECTOMY    . COLONOSCOPY WITH PROPOFOL N/A 12/02/2016   Procedure: COLONOSCOPY WITH PROPOFOL;  Surgeon: Manya Silvas, MD;  Location: [facility] ENDOSCOPY;  Service: Endoscopy;  Laterality: N/A;  . COLONOSCOPY WITH PROPOFOL N/A 11/10/2017   Procedure: COLONOSCOPY WITH PROPOFOL;  Surgeon: Toledo, Benay Pike, MD;  Location: [facility] ENDOSCOPY;  Service: Gastroenterology;  Laterality: N/A;  . colonoscopy with removal lesions by snare    . Endoscoic carpal tunnel release    . ESOPHAGOGASTRODUODENOSCOPY N/A 11/10/2017   Procedure: ESOPHAGOGASTRODUODENOSCOPY (EGD);  Surgeon: Toledo, Benay Pike, MD;  Location: [facility] ENDOSCOPY;  Service:  Gastroenterology;  Laterality: N/A;  . ESOPHAGOGASTRODUODENOSCOPY (EGD) WITH PROPOFOL N/A 12/02/2016   Procedure: ESOPHAGOGASTRODUODENOSCOPY (EGD) WITH PROPOFOL;  Surgeon: Manya Silvas, MD;  Location: [facility] ENDOSCOPY;  Service: Endoscopy;  Laterality: N/A;  . IR IMAGING GUIDED PORT INSERTION  10/01/2017  . Laminectomy posterior lumbar facetectomy and formaninotomy w/Decomp    . Laminectony posterior cervicle decomp w/Facectomy and foraminotomy    . VEIN LIGATION AND STRIPPING Left     There were no vitals filed for this visit.  Subjective Assessment - 06/28/19 1317    Subjective   Mrs Kamp presents for OT visit 6/36 to address BLE lymphedema. Ptreports gout flare continues to improve. Pt and therapist discuss differences between basic and advanced pneumatic sequential compression devices, or "pumps." Pt tells me she has a basic device that was perscribed by her vascular doctor, but she has been unable to use it for the last few months   due to L unna wraps with wound on anterior leg.    Patient is accompanied by:  Family member    Pertinent History  Chronic leg swelling and associated pain > 20 years; , Hx recurrent  ulcers, hx of lymphorrhea, Contributing factors include CKD, stage 3, HTN, Obesity, OSA (has cpap), OA. Other pertinent medical hx include B neuropathy hands and feet, hx vein ligation and stripping s/p 5-6 yrs, cervicle and lumbar  laminectomies, glaucoma and diabetic retinopathy    Limitations  BLE weakness, impaired balance, impaired sensation hands and feet, impaired vision, increased falls risk, decreased hip AROM, claudication    Repetition  Increases Symptoms    Special Tests  +Stemmer base of toes bilaterally    Patient Stated Goals  Want to stop these leg ulcers from returning, Get leg swelling better under control and keep symptoms from getting worse.    Pain Onset  In the past 7 days   >20 YEARS                  OT Treatments/Exercises (OP) - 06/28/19  0001      ADLs   ADL Education Given  Yes  (Pended)       Manual Therapy   Manual Therapy  Edema management;Manual Lymphatic Drainage (MLD);Compression Bandaging  (Pended)                   OT Long Term Goals - 06/06/19 1634      OT LONG TERM GOAL #1   Title  Pt will be able to apply BLE, knee length, multi-layer, short stretch compression wraps daily to one leg at a time using correct gradient techniques with MAXIMUM CAREGIVER ASSISTANCE   to achieve optimal limb volume reduction, to return affected limb/s, as closely as possible, to premorbid size and shape, to limit infection risk, and to improve safe functional ambulation and mobility.    Baseline  Dependent    Time  4    Period  Days    Status  New      OT LONG TERM GOAL #2   Title  Pt will be able to verbalize signs and symptoms of cellulitis infection and identify at least 4 common lymphedema precautions using printed resource for reference to limit LE progression over time to limit risk of infection and LE exacerbation.    Baseline  Max A    Time  4    Period  Days    Status  New      OT LONG TERM GOAL #3   Title  Pt will tolerate knee length,  multilayer short stretch compression wrap to single leg up to 23/ 7 to achieve optimal swelling reduction to arrest lymphedema progression and in preparation for fitting appropriate compression garments/ devices.    Baseline  Max A    Time  6    Period  Days    Status  New      OT LONG TERM GOAL #4   Title  With max CG support daily for compression wrapping  Pt will achieve and sustain at least  85%  compliance with daily LE self-care home program (skin care, lymphatic pumping therex, compression and simple self-MLD) to reduce and control limb swelling, reduce infection risk and limit LE progression.    Baseline  Dependent    Time  12    Period  Weeks    Status  New      OT LONG TERM GOAL #5   Title  Pt will achieve at least 10% limb volume reduction in RLE and 5%  reduction in LLE belowthe knees to reduce recurrent leg ulcers, reduce infection risk and limit LE progression.    Baseline  dependent    Time  12    Period  Weeks    Status  New            Plan - 06/28/19 1559    Clinical  Impression Statement  Gout flar is nearly resolved. Redness, heat and swelling is reduced. Skin condition bilaterally has improved in terms of hydration and flexibility. Pt will begin building tolerance to compression again. Sara Bright presents with bilateral lower extremity lymphedema, L>R, 2/2 CVI and obesity. Patient has attempted to reduce symptoms with home conservative therapies since 12/2018 using off-the-shelf, compression stockings and a basic [phone] sequential pneumatic compression device ("pump"). Lymphedema self-care was interrupted when a venous ulcer formed and Pt was wrapped with Unna Boot to LLE weekly. Symptoms of lymphedema have persisted, including progressive swelling, induration, and lipodermatosclerosis.  Pt is currently undergoing skilled Occupational Therapy for Intensive Phase complete decongestive therapy (CDT), including compression wrapping, therapeutic exercise, skin care and manual lymphatic drainage (MLD). Despite a brief interruption in OT by a due R foot gout flare,  Pt demonstrates a positive response to CDT after 4 visits t as evidenced by a reduction in  RLE swelling and improvement in skin mobility and hydration. The 32-chamber Flexitouch is the only available sequential pneumatic compression device providing proximal to distal lymphatic fluid return via regional lymph nodes (LN) deep abdominal pathways and the thoracic duct to the heart. The basic pneumatic pump is not appropriate for this patient because it provides distal-to-proximal, retrograde massage mobilizing tissue fluid against back pressure which then abruptly ends before reaching regional LNs leaving dense, protein rich fluid distal to the lymph notes at joints. The Flexitouch  pneumatic compression device is medically necessary for long term daily in home use to help manage symptoms of chronic, progressive lymphedema on a daily basis to limit progression and further functional decline. Cont as per POC.    OT Occupational Profile and History  Comprehensive Assessment- Review of records and extensive additional review of physical, cognitive, psychosocial history related to current functional performance    Occupational performance deficits (Please refer to evaluation for details):  ADL's;Leisure;Social Participation;IADL's;Work    Body Structure / Function / Physical Skills  ADL;Decreased knowledge of precautions;Flexibility;ROM;Mobility;Decreased knowledge of use of DME;Balance;Skin integrity;Wound;Edema;Pain;IADL    Rehab Potential  Good    Clinical Decision Making  Several treatment options, min-mod task modification necessary    Comorbidities Affecting Occupational Performance:  Presence of comorbidities impacting occupational performance    Comorbidities impacting occupational performance description:  see SUBJECTIVE for contibuting comorbidities    Modification or Assistance to Complete Evaluation   Max significant modification of tasks or assist is necessary to complete    OT Frequency  2x / week   2-3 x weekly x 12 weeks and prn   OT Duration  12 weeks   and PRN   OT Treatment/Interventions  Self-care/ADL training;Therapeutic exercise;Manual lymph drainage;Compression bandaging;Patient/family education;Other (comment);Therapeutic activities;Manual Therapy;DME and/or AE instruction   SKIN CARE   Plan  CDT: MLD, gradient wraps, ther ex, skin care,    OT Home Exercise Plan  Caregiver to apply compression wraps daily during visit intervals and assist with donning / doffing compression consistently    Recommended Other Services  Consider Velcro-style, adjustable , knee length Jobst, CircAid cmpression wraps as an alternative to traditional elastic stockings for ease of  donning    Consulted and Agree with Plan of Care  Patient       Patient will benefit from skilled therapeutic intervention in order to improve the following deficits and impairments:   Body Structure / Function / Physical Skills: ADL, Decreased knowledge of precautions, Flexibility, ROM, Mobility, Decreased knowledge of use of DME, Balance, Skin integrity, Wound, Edema, Pain, IADL  Visit Diagnosis: Lymphedema, not elsewhere classified    Problem List Patient Active Problem List   Diagnosis Date Noted  . GERD (gastroesophageal reflux disease) 01/20/2019  . Hyperlipidemia 01/20/2019  . Claudication ([place]) 01/07/2019  . Venous ulcer of left leg ([place]) 08/13/2018  . Diabetic polyneuropathy associated with type 2 diabetes mellitus ([place]) 05/30/2018  . Macrocytosis 04/26/2018  . Fecal occult blood test positive 10/08/2017  . IDA (iron deficiency anemia) 10/08/2017  . Chronic venous insufficiency 12/25/2016  . Lymphedema 12/25/2016  . Diabetes ([place]) 12/25/2016  . Essential hypertension 12/25/2016  . Hx of adenomatous colonic polyps 10/22/2016  . DOE (dyspnea on exertion) 07/22/2016  . CKD (chronic kidney disease) stage 3, GFR 30-59 ml/min 06/23/2016  . Swelling of limb 12/31/2015  . Severe obesity (BMI >= 40) ([place]) 12/31/2015  . Varicose veins of both legs with edema 12/31/2015  . VV (varicose veins) 11/27/2015  . Other iron deficiency anemia 10/08/2015  . Anemia due to stage 3 chronic kidney disease 10/08/2015  . BMI 45.0-49.9, adult ([place]) 10/05/2015  . OSA on CPAP 03/10/2015  . Lumbar stenosis with neurogenic claudication 09/30/2011    Andrey Spearman, MS, OTR/L, [facility] 06/28/19 4:17 PM  [place] MAIN [facility] SERVICES [address] [place], [place], [address] Phone: [phone]   Fax:  [phone]  Name: Sara Bright MRN: [id number] Date of Birth: January 19, 1941

## 2019-07-04 ENCOUNTER — Inpatient Hospital Stay: Payer: Medicare HMO

## 2019-07-04 ENCOUNTER — Other Ambulatory Visit: Payer: Self-pay

## 2019-07-04 VITALS — BP 120/60 | HR 64 | Temp 97.0°F | Resp 18

## 2019-07-04 DIAGNOSIS — D631 Anemia in chronic kidney disease: Secondary | ICD-10-CM

## 2019-07-04 DIAGNOSIS — N1832 Chronic kidney disease, stage 3b: Secondary | ICD-10-CM

## 2019-07-04 DIAGNOSIS — I129 Hypertensive chronic kidney disease with stage 1 through stage 4 chronic kidney disease, or unspecified chronic kidney disease: Secondary | ICD-10-CM | POA: Diagnosis not present

## 2019-07-04 DIAGNOSIS — N183 Chronic kidney disease, stage 3 unspecified: Secondary | ICD-10-CM

## 2019-07-04 LAB — HEMOGLOBIN AND HEMATOCRIT, BLOOD
HCT: 29.5 % — ABNORMAL LOW (ref 36.0–46.0)
Hemoglobin: 9.3 g/dL — ABNORMAL LOW (ref 12.0–15.0)

## 2019-07-04 MED ORDER — EPOETIN ALFA-EPBX 10000 UNIT/ML IJ SOLN
10000.0000 [IU] | Freq: Once | INTRAMUSCULAR | Status: AC
  Administered 2019-07-04: 10000 [IU] via SUBCUTANEOUS

## 2019-07-04 MED ORDER — HEPARIN SOD (PORK) LOCK FLUSH 100 UNIT/ML IV SOLN
500.0000 [IU] | Freq: Once | INTRAVENOUS | Status: AC
  Administered 2019-07-04: 11:00:00 500 [IU] via INTRAVENOUS
  Filled 2019-07-04: qty 5

## 2019-07-04 MED ORDER — SODIUM CHLORIDE 0.9% FLUSH
10.0000 mL | INTRAVENOUS | Status: DC | PRN
  Administered 2019-07-04: 10 mL via INTRAVENOUS
  Filled 2019-07-04: qty 10

## 2019-07-04 MED ORDER — EPOETIN ALFA-EPBX 10000 UNIT/ML IJ SOLN
INTRAMUSCULAR | Status: AC
  Filled 2019-07-04: qty 1

## 2019-07-04 NOTE — Patient Instructions (Signed)

## 2019-07-04 NOTE — Progress Notes (Signed)
Port flush and retacrit injection done today.

## 2019-07-05 ENCOUNTER — Telehealth (INDEPENDENT_AMBULATORY_CARE_PROVIDER_SITE_OTHER): Payer: Self-pay

## 2019-07-05 ENCOUNTER — Ambulatory Visit: Payer: Medicare HMO | Admitting: Occupational Therapy

## 2019-07-05 DIAGNOSIS — I890 Lymphedema, not elsewhere classified: Secondary | ICD-10-CM | POA: Diagnosis not present

## 2019-07-05 NOTE — Telephone Encounter (Signed)
Ok I will call Sara Bright at [facility] and make her aware that she needs to resend the the order for the compression pumps for the pt.

## 2019-07-05 NOTE — Telephone Encounter (Signed)
I do not see those orders, they will need to refax

## 2019-07-05 NOTE — Therapy (Signed)
[place] MAIN [facility] SERVICES [address] [place], [place], [address] Phone: [phone]   Fax:  [phone]  Occupational Therapy Treatment  Patient Details  Name: Sara Bright MRN: [id number] Date of Birth: 05/11/1940 Referring Provider (OT): Eulogio Ditch, [place]   Encounter Date: 07/05/2019  OT End of Session - 07/05/19 1646    Visit Number  7    Number of Visits  36    Date for OT Re-Evaluation  09/03/19    OT Start Time  0200    OT Stop Time  0306    OT Time Calculation (min)  66 min    Activity Tolerance  Patient tolerated treatment well;No increased pain    Behavior During Therapy  WFL for tasks assessed/performed       Past Medical History:  Diagnosis Date  . Adenoma of colon   . Adenomatous colon polyp   . Anemia    IDA  . Cataract   . Cervical stenosis of spinal canal   . Chronic kidney disease    stage 3  . Diabetes mellitus without complication ([place])   . GERD (gastroesophageal reflux disease)   . Glaucoma   . Heme positive stool   . History of UTI   . Hx of gallstones   . Hyperkalemia   . Hyperlipidemia   . Hyperlipidemia   . Hypertension   . Lumbar stenosis   . Lymphedema   . Neuromuscular disorder ([place])   . Neuropathy associated with endocrine disorder ([place])   . Obesity   . OSA on CPAP    on CPAP  . Osteoarthritis   . Osteoarthritis   . Port-A-Cath in place   . Retinopathy due to secondary diabetes ([place])   . Secondary hyperparathyroidism of renal origin [facility])     Past Surgical History:  Procedure Laterality Date  . ACDF X2    . BACK SURGERY    . Cataract extraction right    . catarect extraction left    . CHOLECYSTECTOMY    . COLONOSCOPY WITH PROPOFOL N/A 12/02/2016   Procedure: COLONOSCOPY WITH PROPOFOL;  Surgeon: Manya Silvas, MD;  Location: [facility] ENDOSCOPY;  Service: Endoscopy;  Laterality: N/A;  . COLONOSCOPY WITH PROPOFOL N/A 11/10/2017   Procedure: COLONOSCOPY WITH PROPOFOL;  Surgeon:  Toledo, Benay Pike, MD;  Location: [facility] ENDOSCOPY;  Service: Gastroenterology;  Laterality: N/A;  . colonoscopy with removal lesions by snare    . Endoscoic carpal tunnel release    . ESOPHAGOGASTRODUODENOSCOPY N/A 11/10/2017   Procedure: ESOPHAGOGASTRODUODENOSCOPY (EGD);  Surgeon: Toledo, Benay Pike, MD;  Location: [facility] ENDOSCOPY;  Service: Gastroenterology;  Laterality: N/A;  . ESOPHAGOGASTRODUODENOSCOPY (EGD) WITH PROPOFOL N/A 12/02/2016   Procedure: ESOPHAGOGASTRODUODENOSCOPY (EGD) WITH PROPOFOL;  Surgeon: Manya Silvas, MD;  Location: [facility] ENDOSCOPY;  Service: Endoscopy;  Laterality: N/A;  . IR IMAGING GUIDED PORT INSERTION  10/01/2017  . Laminectomy posterior lumbar facetectomy and formaninotomy w/Decomp    . Laminectony posterior cervicle decomp w/Facectomy and foraminotomy    . VEIN LIGATION AND STRIPPING Left     There were no vitals filed for this visit.  Subjective Assessment - 07/05/19 1632    Subjective   Sara Bright presents for OT visit 7/36 to address BLE lymphedema. Pt is accompanied by her adult daughter. Manufacturer's rep for [employer], Jerrell Mylar, is here today to assist Sara Bright w/ trial of Flexitouch advanced sequential pneumatic compression device ([id number]) to RLE.    Patient is accompanied by:  Family member  Pertinent History  Chronic leg swelling and associated pain > 20 years; , Hx recurrent  ulcers, hx of lymphorrhea, Contributing factors include CKD, stage 3, HTN, Obesity, OSA (has cpap), OA. Other pertinent medical hx include B neuropathy hands and feet, hx vein ligation and stripping s/p 5-6 yrs, cervicle and lumbar laminectomies, glaucoma and diabetic retinopathy    Limitations  BLE weakness, impaired balance, impaired sensation hands and feet, impaired vision, increased falls risk, decreased hip AROM, claudication    Repetition  Increases Symptoms    Special Tests  +Stemmer base of toes bilaterally    Patient Stated Goals  Want to stop these leg  ulcers from returning, Get leg swelling better under control and keep symptoms from getting worse.    Currently in Pain?  Yes   chronic pain unchanged since initial eval. Not rated hnumerically today.   Pain Location  Leg    Pain Orientation  Right;Left    Pain Descriptors / Indicators  Aching;Guarding;Tender;Pressure;Sore;Heaviness;Radiating;Discomfort;Tightness;Penetrating;Grimacing;Stabbing;Tiring    Pain Type  Chronic pain    Pain Onset  Other (comment)   >20 YEARS   Pain Frequency  Intermittent                   OT Treatments/Exercises (OP) - 07/05/19 0001      ADLs   ADL Education Given  Yes      Manual Therapy   Manual Therapy  Edema management;Compression Bandaging    Edema Management  Pt completed 1 hr trial with advanced Flexitouch device to LLE w/ 20-30 mmHg    Compression Bandaging  Added single roll of 12 cm wide Artiflex cast padding at distal leg under Rosidal to reduce discomfort at narrowest ankle when wraps tend to slide down and bunch up. RLE gradient compression wraps using 8, 10,and 12 cm short stretch wrapsover single layer of stockinett and 0.4 cm Rosidal foam. Knee length, excluding toes             OT Education - 07/05/19 1644    Education Details  Pt education and family edu for advanced Flexitouch sequential pneumatic compression device ("pump"), including clinical rational, precautions and contraindications, care and, use schedule, and donning and doffing device garments. Pt completed 60 minute trial using 20-30 mmHg and patient's and daughter's questions were answered throughout trial.    Person(s) Educated  Patient;Child(ren)    Methods  Explanation;Demonstration;Verbal cues;Tactile cues;Handout    Comprehension  Verbalized understanding;Returned demonstration;Need further instruction;Verbal cues required;Tactile cues required          OT Long Term Goals - 06/06/19 1634      OT LONG TERM GOAL #1   Title  Pt will be able to apply  BLE, knee length, multi-layer, short stretch compression wraps daily to one leg at a time using correct gradient techniques with MAXIMUM CAREGIVER ASSISTANCE   to achieve optimal limb volume reduction, to return affected limb/s, as closely as possible, to premorbid size and shape, to limit infection risk, and to improve safe functional ambulation and mobility.    Baseline  Dependent    Time  4    Period  Days    Status  New      OT LONG TERM GOAL #2   Title  Pt will be able to verbalize signs and symptoms of cellulitis infection and identify at least 4 common lymphedema precautions using printed resource for reference to limit LE progression over time to limit risk of infection and LE exacerbation.    Baseline  Max A    Time  4    Period  Days    Status  New      OT LONG TERM GOAL #3   Title  Pt will tolerate knee length,  multilayer short stretch compression wrap to single leg up to 23/ 7 to achieve optimal swelling reduction to arrest lymphedema progression and in preparation for fitting appropriate compression garments/ devices.    Baseline  Max A    Time  6    Period  Days    Status  New      OT LONG TERM GOAL #4   Title  With max CG support daily for compression wrapping  Pt will achieve and sustain at least  85%  compliance with daily LE self-care home program (skin care, lymphatic pumping therex, compression and simple self-MLD) to reduce and control limb swelling, reduce infection risk and limit LE progression.    Baseline  Dependent    Time  12    Period  Weeks    Status  New      OT LONG TERM GOAL #5   Title  Pt will achieve at least 10% limb volume reduction in RLE and 5% reduction in LLE belowthe knees to reduce recurrent leg ulcers, reduce infection risk and limit LE progression.    Baseline  dependent    Time  12    Period  Weeks    Status  New            Plan - 07/05/19 1646    Clinical Impression Statement  Sara Bright presents with bilateral lower  extremity lymphedema, L>R, 2/2 CVI and obesity. Patient has attempted to reduce symptoms with home conservative therapies since 12/2018 using off-the-shelf, compression stockings and a basic [phone] sequential pneumatic compression device ("pump"). Lymphedema self-care was interrupted when a venous ulcer formed and Pt was wrapped with Unna Boot to LLE weekly. Symptoms of lymphedema have persisted, including progressive swelling, induration, and lipodermatosclerosis.  Pt is currently undergoing skilled Occupational Therapy for Intensive Phase complete decongestive therapy (CDT), including compression wrapping, therapeutic exercise, skin care and manual lymphatic drainage (MLD). Despite a brief interruption in OT by a due R foot gout flare,  Pt demonstrates a positive response to CDT after 1 month as evidenced by a reduction in LE swelling and improvement in skin mobility and hydration. Patient has tried conservative treatments for 4 weeks including compression, exercise and elevation with symptoms of BLE lymphedema and truncal lymphedema and hyperplasia remaining. Pt tolerated Flexitouch trial without increased pain. After trial positive indentations at anterior and medial distal R leg and at [facility] were observed. Cont as per POC.    OT Occupational Profile and History  Comprehensive Assessment- Review of records and extensive additional review of physical, cognitive, psychosocial history related to current functional performance    Occupational performance deficits (Please refer to evaluation for details):  ADL's;Leisure;Social Participation;IADL's;Work    Body Structure / Function / Physical Skills  ADL;Decreased knowledge of precautions;Flexibility;ROM;Mobility;Decreased knowledge of use of DME;Balance;Skin integrity;Wound;Edema;Pain;IADL    Rehab Potential  Good    Clinical Decision Making  Several treatment options, min-mod task modification necessary    Comorbidities Affecting Occupational Performance:   Presence of comorbidities impacting occupational performance    Comorbidities impacting occupational performance description:  see SUBJECTIVE for contibuting comorbidities    Modification or Assistance to Complete Evaluation   Max significant modification of tasks or assist is necessary to complete    OT Frequency  2x /  week   2-3 x weekly x 12 weeks and prn   OT Duration  12 weeks   and PRN   OT Treatment/Interventions  Self-care/ADL training;Therapeutic exercise;Manual lymph drainage;Compression bandaging;Patient/family education;Other (comment);Therapeutic activities;Manual Therapy;DME and/or AE instruction   SKIN CARE   Plan  CDT: MLD, gradient wraps, ther ex, skin care,    OT Home Exercise Plan  Caregiver to apply compression wraps daily during visit intervals and assist with donning / doffing compression consistently    Recommended Other Services  Consider Velcro-style, adjustable , knee length Jobst, CircAid cmpression wraps as an alternative to traditional elastic stockings for ease of donning    Consulted and Agree with Plan of Care  Patient       Patient will benefit from skilled therapeutic intervention in order to improve the following deficits and impairments:   Body Structure / Function / Physical Skills: ADL, Decreased knowledge of precautions, Flexibility, ROM, Mobility, Decreased knowledge of use of DME, Balance, Skin integrity, Wound, Edema, Pain, IADL       Visit Diagnosis: Lymphedema, not elsewhere classified    Problem List Patient Active Problem List   Diagnosis Date Noted  . GERD (gastroesophageal reflux disease) 01/20/2019  . Hyperlipidemia 01/20/2019  . Claudication ([place]) 01/07/2019  . Venous ulcer of left leg ([place]) 08/13/2018  . Diabetic polyneuropathy associated with type 2 diabetes mellitus ([place]) 05/30/2018  . Macrocytosis 04/26/2018  . Fecal occult blood test positive 10/08/2017  . IDA (iron deficiency anemia) 10/08/2017  . Chronic venous  insufficiency 12/25/2016  . Lymphedema 12/25/2016  . Diabetes ([place]) 12/25/2016  . Essential hypertension 12/25/2016  . Hx of adenomatous colonic polyps 10/22/2016  . DOE (dyspnea on exertion) 07/22/2016  . CKD (chronic kidney disease) stage 3, GFR 30-59 ml/min 06/23/2016  . Swelling of limb 12/31/2015  . Severe obesity (BMI >= 40) ([place]) 12/31/2015  . Varicose veins of both legs with edema 12/31/2015  . VV (varicose veins) 11/27/2015  . Other iron deficiency anemia 10/08/2015  . Anemia due to stage 3 chronic kidney disease 10/08/2015  . BMI 45.0-49.9, adult ([employer]) 10/05/2015  . OSA on CPAP 03/10/2015  . Lumbar stenosis with neurogenic claudication 09/30/2011    Andrey Spearman, MS, OTR/L, [facility] 07/05/19 5:02 PM  [place] MAIN [facility] SERVICES [address] [place], [place], [address] Phone: [phone]   Fax:  [phone]  Name: Sara Bright MRN: [id number] Date of Birth: 1940-09-27

## 2019-07-05 NOTE — Telephone Encounter (Signed)
Sara Bright from [employer] called about a Rx for NP Arna Medici to sign for compression pumps  Called the flex touch. Sara Bright mentioned that the order was faxed Monday. I spoke to the NP and she  Is looking for the order and if she has it will sign  If not I will call and request for it to be resent.

## 2019-07-05 NOTE — Telephone Encounter (Signed)
I called and left a voicemail for Sara Bright to re fax over the order for the patient.

## 2019-07-11 ENCOUNTER — Ambulatory Visit: Payer: Medicare HMO | Attending: Nurse Practitioner | Admitting: Occupational Therapy

## 2019-07-11 DIAGNOSIS — I890 Lymphedema, not elsewhere classified: Secondary | ICD-10-CM | POA: Insufficient documentation

## 2019-07-12 ENCOUNTER — Ambulatory Visit: Payer: Medicare HMO | Admitting: Occupational Therapy

## 2019-07-12 ENCOUNTER — Other Ambulatory Visit: Payer: Self-pay

## 2019-07-12 DIAGNOSIS — I890 Lymphedema, not elsewhere classified: Secondary | ICD-10-CM

## 2019-07-12 NOTE — Therapy (Signed)
[place] MAIN [facility] SERVICES [address] [place], [place], [address] Phone: [phone]   Fax:  [phone]  Occupational Therapy Treatment  Patient Details  Name: Sara Bright MRN: [id number] Date of Birth: 1940/04/05 Referring Provider (OT): Eulogio Ditch, [place]   Encounter Date: 07/12/2019  OT End of Session - 07/12/19 0076    Visit Number  8    Number of Visits  36    Date for OT Re-Evaluation  09/03/19    OT Start Time  1015    OT Stop Time  1130    OT Time Calculation (min)  75 min    Activity Tolerance  Patient tolerated treatment well;No increased pain    Behavior During Therapy  WFL for tasks assessed/performed       Past Medical History:  Diagnosis Date  . Adenoma of colon   . Adenomatous colon polyp   . Anemia    IDA  . Cataract   . Cervical stenosis of spinal canal   . Chronic kidney disease    stage 3  . Diabetes mellitus without complication ([place])   . GERD (gastroesophageal reflux disease)   . Glaucoma   . Heme positive stool   . History of UTI   . Hx of gallstones   . Hyperkalemia   . Hyperlipidemia   . Hyperlipidemia   . Hypertension   . Lumbar stenosis   . Lymphedema   . Neuromuscular disorder ([place])   . Neuropathy associated with endocrine disorder ([place])   . Obesity   . OSA on CPAP    on CPAP  . Osteoarthritis   . Osteoarthritis   . Port-A-Cath in place   . Retinopathy due to secondary diabetes ([place])   . Secondary hyperparathyroidism of renal origin [facility])     Past Surgical History:  Procedure Laterality Date  . ACDF X2    . BACK SURGERY    . Cataract extraction right    . catarect extraction left    . CHOLECYSTECTOMY    . COLONOSCOPY WITH PROPOFOL N/A 12/02/2016   Procedure: COLONOSCOPY WITH PROPOFOL;  Surgeon: Manya Silvas, MD;  Location: [facility] ENDOSCOPY;  Service: Endoscopy;  Laterality: N/A;  . COLONOSCOPY WITH PROPOFOL N/A 11/10/2017   Procedure: COLONOSCOPY WITH PROPOFOL;  Surgeon:  Toledo, Benay Pike, MD;  Location: [facility] ENDOSCOPY;  Service: Gastroenterology;  Laterality: N/A;  . colonoscopy with removal lesions by snare    . Endoscoic carpal tunnel release    . ESOPHAGOGASTRODUODENOSCOPY N/A 11/10/2017   Procedure: ESOPHAGOGASTRODUODENOSCOPY (EGD);  Surgeon: Toledo, Benay Pike, MD;  Location: [facility] ENDOSCOPY;  Service: Gastroenterology;  Laterality: N/A;  . ESOPHAGOGASTRODUODENOSCOPY (EGD) WITH PROPOFOL N/A 12/02/2016   Procedure: ESOPHAGOGASTRODUODENOSCOPY (EGD) WITH PROPOFOL;  Surgeon: Manya Silvas, MD;  Location: [facility] ENDOSCOPY;  Service: Endoscopy;  Laterality: N/A;  . IR IMAGING GUIDED PORT INSERTION  10/01/2017  . Laminectomy posterior lumbar facetectomy and formaninotomy w/Decomp    . Laminectony posterior cervicle decomp w/Facectomy and foraminotomy    . VEIN LIGATION AND STRIPPING Left     There were no vitals filed for this visit.                OT Treatments/Exercises (OP) - 07/12/19 0001      ADLs   ADL Education Given  Yes      Manual Therapy   Manual Therapy  Edema management;Compression Bandaging;Manual Lymphatic Drainage (MLD)    Manual Lymphatic Drainage (MLD)  MLDto RLEutilizing short neck sequence, deep  abdominal pathway w/ diapgragmatic breathing and functional inguinal LN , nd proximal to distal J strokes to thigh leg and foot. Excellent tolerance.    Compression Bandaging  Added single roll of 12 cm wide Artiflex cast padding at distal leg under Rosidal to reduce discomfort at narrowest ankle when wraps tend to slide down and bunch up. RLE gradient compression wraps using 8, 10,and 12 cm short stretch wrapsover single layer of stockinett and 0.4 cm Rosidal foam. Knee length, excluding toes             OT Education - 07/12/19 1249    Education Details  Continued skilled Pt/caregiver education  And LE ADL training throughout visit for lymphedema self care/ home program, including compression wrapping, compression garment and  device wear/care, lymphatic pumping ther ex, simple self-MLD, and skin care. Discussed progress towards goals.    Person(s) Educated  Patient;Child(ren)    Methods  Explanation;Demonstration;Handout    Comprehension  Verbalized understanding;Returned demonstration;Need further instruction          OT Long Term Goals - 06/06/19 1634      OT LONG TERM GOAL #1   Title  Pt will be able to apply BLE, knee length, multi-layer, short stretch compression wraps daily to one leg at a time using correct gradient techniques with MAXIMUM CAREGIVER ASSISTANCE   to achieve optimal limb volume reduction, to return affected limb/s, as closely as possible, to premorbid size and shape, to limit infection risk, and to improve safe functional ambulation and mobility.    Baseline  Dependent    Time  4    Period  Days    Status  New      OT LONG TERM GOAL #2   Title  Pt will be able to verbalize signs and symptoms of cellulitis infection and identify at least 4 common lymphedema precautions using printed resource for reference to limit LE progression over time to limit risk of infection and LE exacerbation.    Baseline  Max A    Time  4    Period  Days    Status  New      OT LONG TERM GOAL #3   Title  Pt will tolerate knee length,  multilayer short stretch compression wrap to single leg up to 23/ 7 to achieve optimal swelling reduction to arrest lymphedema progression and in preparation for fitting appropriate compression garments/ devices.    Baseline  Max A    Time  6    Period  Days    Status  New      OT LONG TERM GOAL #4   Title  With max CG support daily for compression wrapping  Pt will achieve and sustain at least  85%  compliance with daily LE self-care home program (skin care, lymphatic pumping therex, compression and simple self-MLD) to reduce and control limb swelling, reduce infection risk and limit LE progression.    Baseline  Dependent    Time  12    Period  Weeks    Status  New       OT LONG TERM GOAL #5   Title  Pt will achieve at least 10% limb volume reduction in RLE and 5% reduction in LLE belowthe knees to reduce recurrent leg ulcers, reduce infection risk and limit LE progression.    Baseline  dependent    Time  12    Period  Weeks    Status  New  Plan - 07/12/19 1254    Clinical Impression Statement  Mrs. Swartout presents for OT visit 8/10 to address BLE secondary LE. Pt presents with knee length, multi layer, gradient compression wraps in place on RE. Limb volume is markedly decreased since last visit. Skin is well hydrated and Pt is tolerating wraps much better.Pt responded to MLD with analgesic effect today. Wraps reapplied. Pt demonstrates excellent progress towards goals.    OT Occupational Profile and History  Comprehensive Assessment- Review of records and extensive additional review of physical, cognitive, psychosocial history related to current functional performance    Occupational performance deficits (Please refer to evaluation for details):  ADL's;Leisure;Social Participation;IADL's;Work    Body Structure / Function / Physical Skills  ADL;Decreased knowledge of precautions;Flexibility;ROM;Mobility;Decreased knowledge of use of DME;Balance;Skin integrity;Wound;Edema;Pain;IADL    Rehab Potential  Good    Clinical Decision Making  Several treatment options, min-mod task modification necessary    Comorbidities Affecting Occupational Performance:  Presence of comorbidities impacting occupational performance    Comorbidities impacting occupational performance description:  see SUBJECTIVE for contibuting comorbidities    Modification or Assistance to Complete Evaluation   Max significant modification of tasks or assist is necessary to complete    OT Frequency  2x / week   2-3 x weekly x 12 weeks and prn   OT Duration  12 weeks   and PRN   OT Treatment/Interventions  Self-care/ADL training;Therapeutic exercise;Manual lymph drainage;Compression  bandaging;Patient/family education;Other (comment);Therapeutic activities;Manual Therapy;DME and/or AE instruction   SKIN CARE   Plan  CDT: MLD, gradient wraps, ther ex, skin care,    OT Home Exercise Plan  Caregiver to apply compression wraps daily during visit intervals and assist with donning / doffing compression consistently    Recommended Other Services  Consider Velcro-style, adjustable , knee length Jobst, CircAid cmpression wraps as an alternative to traditional elastic stockings for ease of donning    Consulted and Agree with Plan of Care  Patient       Patient will benefit from skilled therapeutic intervention in order to improve the following deficits and impairments:   Body Structure / Function / Physical Skills: ADL, Decreased knowledge of precautions, Flexibility, ROM, Mobility, Decreased knowledge of use of DME, Balance, Skin integrity, Wound, Edema, Pain, IADL       Visit Diagnosis: Lymphedema, not elsewhere classified    Problem List Patient Active Problem List   Diagnosis Date Noted  . GERD (gastroesophageal reflux disease) 01/20/2019  . Hyperlipidemia 01/20/2019  . Claudication ([place]) 01/07/2019  . Venous ulcer of left leg ([place]) 08/13/2018  . Diabetic polyneuropathy associated with type 2 diabetes mellitus ([place]) 05/30/2018  . Macrocytosis 04/26/2018  . Fecal occult blood test positive 10/08/2017  . IDA (iron deficiency anemia) 10/08/2017  . Chronic venous insufficiency 12/25/2016  . Lymphedema 12/25/2016  . Diabetes ([place]) 12/25/2016  . Essential hypertension 12/25/2016  . Hx of adenomatous colonic polyps 10/22/2016  . DOE (dyspnea on exertion) 07/22/2016  . CKD (chronic kidney disease) stage 3, GFR 30-59 ml/min 06/23/2016  . Swelling of limb 12/31/2015  . Severe obesity (BMI >= 40) ([place]) 12/31/2015  . Varicose veins of both legs with edema 12/31/2015  . VV (varicose veins) 11/27/2015  . Other iron deficiency anemia 10/08/2015  . Anemia due to stage 3  chronic kidney disease 10/08/2015  . BMI 45.0-49.9, adult ([place]) 10/05/2015  . OSA on CPAP 03/10/2015  . Lumbar stenosis with neurogenic claudication 09/30/2011    Andrey Spearman, MS, OTR/L, [facility] 07/12/19 12:59 PM  [place] MAIN [facility] SERVICES [address] [place], [place], [address] Phone: [phone]   Fax:  [phone]  Name: Sara Bright MRN: [id number] Date of Birth: 12-02-1940

## 2019-07-13 ENCOUNTER — Ambulatory Visit: Payer: Medicare HMO | Admitting: Occupational Therapy

## 2019-07-13 NOTE — Progress Notes (Signed)
[facility]  [address] [place], [place] [address] Phone: [phone]  Fax: [phone]   Clinic Day:  07/18/2019  Referring physician: Sallee Lange, *  Chief Complaint: Sara Bright is a 79 y.o. female with anemia of chronic kidney disease who is seen for a 3 month assessment.   HPI: The patient was last seen in the hematology clinic on 04/25/2019. At that time, she felt "fine". She had shortness of breath on exertion. Hematocrit was 30.0, hemoglobin 9.4, MCV 102.4, platelets 165,000, WBC 4,500. Ferritin was 145. Creatinine was 1.72.  She received Retacrit.   Patient received Retacrit x 5 (05/09/2019 - 07/04/2019).   The patient saw Dr. Holley Raring on 06/19/2019. Kidney function was stable. She had some lower extremity edema.  Follow up planned for 10/19/2019.   She was seen in [facility] by Toni Arthurs, NP on 06/20/2019. She was having increased pain, redness and warmth in the medial left foot/great toe x 6 days. Tylenol gave mild relief. She had significant BLE edema. She noted getting a UNNA boot/wraps for her legs. She had no skin break down.  Lasix was increased to 60 mg/day x 4 days.  She received prednisone 40 mg/day x  5 days. She was prescribed colchicine 0.6 mg BID x 5 days for gout flare.   Labs followed: 05/09/2019: Hematocrit 29.7. Hemoglobin 9.3.  TSH 5.026. Vitamin B12 was 727 with folate 25.0.  05/23/2019: Hematocrit 31.8. Hemoglobin 9.7. 06/06/2019: Hematocrit 31.1. Hemoglobin 9.6. 06/20/2019: Hematocrit 30.9. Hemoglobin 9.6. 07/04/2019: Hematocrit 29.5. Hemoglobin 9.3.  During the interim, the patient has been feeling good. She has bilateral leg swelling. Her legs are getting wrapped daily. She notes more swelling her left leg verses her right leg. She continues to have exertional shortness of breath and uses her inhaler as needed. She will see Raul Del next week. She has been tolerating Retacrit. Her diabetes is well  controlled. The gout in her left foot has resolved. Her diet is good. She denies any bleeding.    Past Medical History:  Diagnosis Date  . Adenoma of colon   . Adenomatous colon polyp   . Anemia    IDA  . Cataract   . Cervical stenosis of spinal canal   . Chronic kidney disease    stage 3  . Diabetes mellitus without complication ([place])   . GERD (gastroesophageal reflux disease)   . Glaucoma   . Heme positive stool   . History of UTI   . Hx of gallstones   . Hyperkalemia   . Hyperlipidemia   . Hyperlipidemia   . Hypertension   . Lumbar stenosis   . Lymphedema   . Neuromuscular disorder ([place])   . Neuropathy associated with endocrine disorder ([place])   . Obesity   . OSA on CPAP    on CPAP  . Osteoarthritis   . Osteoarthritis   . Port-A-Cath in place   . Retinopathy due to secondary diabetes ([place])   . Secondary hyperparathyroidism of renal origin [facility])     Past Surgical History:  Procedure Laterality Date  . ACDF X2    . BACK SURGERY    . Cataract extraction right    . catarect extraction left    . CHOLECYSTECTOMY    . COLONOSCOPY WITH PROPOFOL N/A 12/02/2016   Procedure: COLONOSCOPY WITH PROPOFOL;  Surgeon: Manya Silvas, MD;  Location: [facility] ENDOSCOPY;  Service: Endoscopy;  Laterality: N/A;  . COLONOSCOPY WITH PROPOFOL N/A 11/10/2017   Procedure:  COLONOSCOPY WITH PROPOFOL;  Surgeon: Toledo, Benay Pike, MD;  Location: [facility] ENDOSCOPY;  Service: Gastroenterology;  Laterality: N/A;  . colonoscopy with removal lesions by snare    . Endoscoic carpal tunnel release    . ESOPHAGOGASTRODUODENOSCOPY N/A 11/10/2017   Procedure: ESOPHAGOGASTRODUODENOSCOPY (EGD);  Surgeon: Toledo, Benay Pike, MD;  Location: [facility] ENDOSCOPY;  Service: Gastroenterology;  Laterality: N/A;  . ESOPHAGOGASTRODUODENOSCOPY (EGD) WITH PROPOFOL N/A 12/02/2016   Procedure: ESOPHAGOGASTRODUODENOSCOPY (EGD) WITH PROPOFOL;  Surgeon: Manya Silvas, MD;  Location: [facility] ENDOSCOPY;  Service: Endoscopy;  Laterality:  N/A;  . IR IMAGING GUIDED PORT INSERTION  10/01/2017  . Laminectomy posterior lumbar facetectomy and formaninotomy w/Decomp    . Laminectony posterior cervicle decomp w/Facectomy and foraminotomy    . VEIN LIGATION AND STRIPPING Left     Family History  Problem Relation Age of Onset  . Coronary artery disease Mother   . Diabetes type II Mother   . Hypertension Mother   . Tuberculosis Father   . Stroke Father   . Diabetes type II Sister   . Migraines Sister   . Alcohol abuse Brother   . Coronary artery disease Brother   . Diabetes type II Brother   . Kidney cancer Brother   . Kidney disease Brother   . Heart attack Brother   . Hypertension Brother     Social History:  reports that she has never smoked. She has never used smokeless tobacco. She reports that she does not drink alcohol or use drugs. She lives in [place]. The patient is alone today.  Allergies:  Allergies  Allergen Reactions  . Aspirin     Other reaction(s): Other (see comments)  . Hydrocodone-Acetaminophen Other (See Comments)    Sedation and GI upset, pt is not allergic to acetaminophen  . Statins Other (See Comments)  . Tramadol-Acetaminophen Nausea And Vomiting    GI upset, pt is not allergic to acetaminophen    Current Medications: Current Outpatient Medications  Medication Sig Dispense Refill  . acetaminophen (TYLENOL) 500 MG tablet Take 500 mg by mouth every 6 (six) hours as needed.     Marland Kitchen albuterol (PROVENTIL HFA;VENTOLIN HFA) 108 (90 Base) MCG/ACT inhaler Inhale 2 puffs into the lungs every 6 (six) hours as needed for wheezing or shortness of breath.    Marland Kitchen amLODipine (NORVASC) 10 MG tablet Take 10 mg by mouth daily.    Marland Kitchen aspirin EC 81 MG tablet Take 81 mg by mouth daily.    . ASSURE COMFORT LANCETS 30G MISC     . carvedilol (COREG) 6.25 MG tablet Take 6.25 mg by mouth 2 (two) times daily.  12  . ferrous sulfate 325 (65 FE) MG tablet Take 325 mg by mouth daily.    . furosemide (LASIX) 40 MG tablet Take  40 mg by mouth daily.     Marland Kitchen gabapentin (NEURONTIN) 100 MG capsule Take 100 mg by mouth 2 (two) times daily.     Marland Kitchen gemfibrozil (LOPID) 600 MG tablet Take 600 mg by mouth daily.     Marland Kitchen glimepiride (AMARYL) 1 MG tablet Take 0.5 mg by mouth daily with breakfast.     . hydrALAZINE (APRESOLINE) 50 MG tablet Take 25 mg by mouth 3 (three) times daily.   12  . Lancet Devices (ADJUSTABLE LANCING DEVICE) MISC     . latanoprost (XALATAN) 0.005 % ophthalmic solution Place 1 drop into both eyes.     Marland Kitchen lidocaine-prilocaine (EMLA) cream Apply 1 application topically as needed. 30 g 3  .  losartan (COZAAR) 50 MG tablet Take 100 mg by mouth daily.     . Multiple Vitamins-Minerals (CENTRUM SILVER) tablet Take 1 tablet by mouth daily.     . Omega-3 Fatty Acids (FISH OIL) 1000 MG CAPS Take 1,000 mg by mouth daily.     Marland Kitchen omeprazole (PRILOSEC) 20 MG capsule Take 20 mg by mouth daily.     . ONE TOUCH ULTRA TEST test strip     . pioglitazone (ACTOS) 30 MG tablet Take 30 mg by mouth daily.     . calcitRIOL (ROCALTROL) 0.25 MCG capsule Take 0.5 mcg by mouth 3 (three) times a week.     . doxycycline (VIBRAMYCIN) 100 MG capsule Take 1 capsule (100 mg total) by mouth 2 (two) times daily. (Patient not taking: Reported on 07/18/2019) 20 capsule 0  . gemfibrozil (LOPID) 600 MG tablet TAKE 1 TABLET BY MOUTH EVERY DAY    . omeprazole (PRILOSEC) 20 MG capsule TAKE 1 CAPSULE BY MOUTH EVERY DAY     No current facility-administered medications for this visit.    Review of Systems  Constitutional: Negative for chills, diaphoresis, fever, malaise/fatigue and weight loss (up 3 lbs).       Doing good.  HENT: Negative.  Negative for congestion, ear pain, hearing loss, nosebleeds, sinus pain and sore throat.   Eyes: Negative.  Negative for blurred vision and double vision.  Respiratory: Positive for shortness of breath (on exertion). Negative for cough, hemoptysis, sputum production and wheezing (uses inhaler).   Cardiovascular:  Positive for leg swelling (left>right; legs are wrapped up). Negative for chest pain and palpitations.  Gastrointestinal: Negative.  Negative for abdominal pain, blood in stool, constipation, diarrhea, heartburn, melena, nausea and vomiting.       Good diet.  Genitourinary: Negative.  Negative for dysuria, frequency and urgency.  Musculoskeletal: Negative.  Negative for back pain, joint pain and myalgias.  Skin: Negative.   Neurological: Negative.  Negative for dizziness, tingling, sensory change, speech change, focal weakness and weakness.  Endo/Heme/Allergies: Does not bruise/bleed easily.       Diabetes, under good control.  No thyroid issues.  Psychiatric/Behavioral: Negative.  Negative for depression and memory loss. The patient is not nervous/anxious and does not have insomnia.   All other systems reviewed and are negative.  Performance status (ECOG):  1  Vitals Blood pressure (!) 162/55, pulse 73, temperature (!) 96.7 F (35.9 C), temperature source Tympanic, resp. rate 20, weight 263 lb 9 oz (119.6 kg), SpO2 100 %.   Physical Exam  Constitutional: She is oriented to person, place, and time. She appears well-developed and well-nourished. No distress.  She has a cane by her side.  She needs some assistance onto exam room table.  HENT:  Head: Normocephalic and atraumatic.  Mouth/Throat: Oropharynx is clear and moist. No oropharyngeal exudate.  Short dark brown styled wig.  Mask.  Eyes: Pupils are equal, round, and reactive to light. Conjunctivae and EOM are normal. No scleral icterus.  Glasses.  Brown eyes.  Neck: No JVD present.  Cardiovascular: Normal rate, regular rhythm and normal heart sounds. Exam reveals no gallop.  No murmur heard. Pulmonary/Chest: Effort normal and breath sounds normal. No respiratory distress. She has no wheezes. She has no rales.  Abdominal: Soft. Bowel sounds are normal. She exhibits no distension and no mass. There is no abdominal tenderness. There is  no rebound and no guarding.  Musculoskeletal:        General: Edema present. No tenderness. Normal range of  motion.     Cervical back: Normal range of motion and neck supple.     Comments: Legs are wrapped up.  Lymphadenopathy:       Head (right side): No preauricular, no posterior auricular and no occipital adenopathy present.       Head (left side): No preauricular, no posterior auricular and no occipital adenopathy present.    She has no cervical adenopathy.    She has no axillary adenopathy.       Right: No supraclavicular adenopathy present.       Left: No supraclavicular adenopathy present.  Neurological: She is alert and oriented to person, place, and time. She has normal reflexes.  Skin: Skin is warm and dry. No rash noted. She is not diaphoretic. No erythema. No pallor.  Psychiatric: She has a normal mood and affect. Her behavior is normal. Judgment and thought content normal.  Nursing note and vitals reviewed.   No visits with results within 3 Day(s) from this visit.  Latest known visit with results is:  Infusion on 07/04/2019  Component Date Value Ref Range Status  . Hemoglobin 07/04/2019 9.3* 12.0 - 15.0 g/dL Final  . HCT 07/04/2019 29.5* 36.0 - 46.0 % Final   Performed at [facility], [address] [place], [place] [address]    Assessment:  Sara Bright is a 79 y.o. female withanemia of chronic kidney disease.She has stage III chronic kidney disease. SPEP and free light chain ratio was normal on 11/12/2015.  She receivedAranesp 200 mcg on 09/02/2016, 12/22/2016, 03/23/2017, 07/20/2017, 08/17/2017, and 09/15/2017. She received Aranesp 300 mcgon 10/06/2017, 10/19/2017, 01/04/2018, 03/08/2018,and03/05/2018.She began Retacrit10,000 units on 07/04/2018 (last 01/17/2019).  She received 1 unit of PRBCson 09/22/2017. She received Venoferx 4 (10/22/2015 - 11/05/2015), x4 (01/21/2016 - 02/11/2016), 11/24/2016, 06/22/2017, 09/14/2017, 10/01/2017,  10/12/2017, 10/26/2017, 11/16/2017, and 03/01/2018.  Ferritinhas been followed: 63 on 10/08/2015, 290 on 01/14/2016, 651 on 05/12/2016, 467 on 09/01/2016, 413 on 11/19/2016, 633 on 12/22/2016, 374 on 01/19/2017, 480 on 06/22/2017, 135 on 09/22/2017, 159 on 10/06/2017, 373 on 03/01/2018, 289 on 03/29/2018, 257 on 07/04/2018, 217 on 08/30/2018, 178 on 11/08/2018, 191 on 01/31/2019, and 145 on 04/25/2019.   EGDon 09/04/2019revealed an irregularZ-line irregular, at the gastroesophageal junction, normal stomach, and normal duodenum.Colonoscopyon 11/10/2017 revealed diverticulosis in the sigmoid colonand non-bleeding internal hemorrhoids.  She has macrocytic RBC indices. B12, folate, and TSH were normal on 05/09/2019.  She received her first COVID-19 vaccine on 04/19/2019, second dose was on 05/17/2019.   Symptomatically, she feels good.  She has shortness of breath on exertion.  Plan: 1.   Labs today: CBC with diff, BMP, ferritin. 2.Anemia of chronic renal disease Hematocrit31.0. Hemoglobin9.4. [phone].   Maintain ferritin >100. Retacrit today. 3.Macrocytosis MCV 3.7. She has had macrocytic RBC indices since07/26/2019. Shehas no known liver disease. B12 was727,folate25,andTSH 5.026 on 05/09/2019. Continue to monitor. 4.Renal insufficiency Creatinine1.79. He is followed by Dr. Holley Raring. 5.RTC every 2 weeks x 6 for labs (HCT/Hgb) and +/- Retacrit.  6.   RTC in 12 weeks for MD assessment, labs (CBC with diff, BMP, ferritin) and +/- Retacrit.  I discussed the assessment and treatment plan with the patient.  The patient was provided an opportunity to ask questions and all were answered.  The patient agreed with the plan and demonstrated an understanding of the instructions.  The patient was advised to call back if the symptoms  worsen or if the condition fails to improve as anticipated.   Lequita Asal, MD, PhD  07/18/2019, 10:34 AM  I, Selena Batten, am acting as scribe for [employer]. Mike Gip, MD, PhD.  I, Yomaira Solar C. Mike Gip, MD, have reviewed the above documentation for accuracy and completeness, and I agree with the above.

## 2019-07-14 ENCOUNTER — Ambulatory Visit: Payer: Medicare HMO | Admitting: Occupational Therapy

## 2019-07-14 ENCOUNTER — Other Ambulatory Visit: Payer: Self-pay

## 2019-07-14 DIAGNOSIS — I890 Lymphedema, not elsewhere classified: Secondary | ICD-10-CM

## 2019-07-14 NOTE — Therapy (Signed)
[place] MAIN [facility] SERVICES [address] [place], [place], [address] Phone: [phone]   Fax:  [phone]  Occupational Therapy Treatment  Patient Details  Name: Sara Bright MRN: [id number] Date of Birth: [birth year] Referring Provider (OT): Eulogio Ditch, [place]   Encounter Date: 07/14/2019  OT End of Session - 07/14/19 1121    Visit Number  9    Number of Visits  36    Date for OT Re-Evaluation  09/03/19    OT Start Time  1005    OT Stop Time  1117    OT Time Calculation (min)  72 min    Activity Tolerance  Patient tolerated treatment well;No increased pain    Behavior During Therapy  WFL for tasks assessed/performed       Past Medical History:  Diagnosis Date  . Adenoma of colon   . Adenomatous colon polyp   . Anemia    IDA  . Cataract   . Cervical stenosis of spinal canal   . Chronic kidney disease    stage 3  . Diabetes mellitus without complication ([place])   . GERD (gastroesophageal reflux disease)   . Glaucoma   . Heme positive stool   . History of UTI   . Hx of gallstones   . Hyperkalemia   . Hyperlipidemia   . Hyperlipidemia   . Hypertension   . Lumbar stenosis   . Lymphedema   . Neuromuscular disorder ([place])   . Neuropathy associated with endocrine disorder ([place])   . Obesity   . OSA on CPAP    on CPAP  . Osteoarthritis   . Osteoarthritis   . Port-A-Cath in place   . Retinopathy due to secondary diabetes ([place])   . Secondary hyperparathyroidism of renal origin [facility])     Past Surgical History:  Procedure Laterality Date  . ACDF X2    . BACK SURGERY    . Cataract extraction right    . catarect extraction left    . CHOLECYSTECTOMY    . COLONOSCOPY WITH PROPOFOL N/A 12/02/2016   Procedure: COLONOSCOPY WITH PROPOFOL;  Surgeon: Manya Silvas, MD;  Location: [facility] ENDOSCOPY;  Service: Endoscopy;  Laterality: N/A;  . COLONOSCOPY WITH PROPOFOL N/A 11/10/2017   Procedure: COLONOSCOPY WITH PROPOFOL;  Surgeon:  Toledo, Benay Pike, MD;  Location: [facility] ENDOSCOPY;  Service: Gastroenterology;  Laterality: N/A;  . colonoscopy with removal lesions by snare    . Endoscoic carpal tunnel release    . ESOPHAGOGASTRODUODENOSCOPY N/A 11/10/2017   Procedure: ESOPHAGOGASTRODUODENOSCOPY (EGD);  Surgeon: Toledo, Benay Pike, MD;  Location: [facility] ENDOSCOPY;  Service: Gastroenterology;  Laterality: N/A;  . ESOPHAGOGASTRODUODENOSCOPY (EGD) WITH PROPOFOL N/A 12/02/2016   Procedure: ESOPHAGOGASTRODUODENOSCOPY (EGD) WITH PROPOFOL;  Surgeon: Manya Silvas, MD;  Location: [facility] ENDOSCOPY;  Service: Endoscopy;  Laterality: N/A;  . IR IMAGING GUIDED PORT INSERTION  10/01/2017  . Laminectomy posterior lumbar facetectomy and formaninotomy w/Decomp    . Laminectony posterior cervicle decomp w/Facectomy and foraminotomy    . VEIN LIGATION AND STRIPPING Left     There were no vitals filed for this visit.  Subjective Assessment - 07/14/19 1119    Subjective   Sara Bright presents for OT visit 9/36 to address BLE lymphedema. Pt is unaccompanied. Pt denies leg pain bilaterally this morning. She states she is pleased with swelling reduction on RLE thuis far.    Patient is accompanied by:  Family member    Pertinent History  Chronic leg swelling and associated  pain > 20 years; , Hx recurrent  ulcers, hx of lymphorrhea, Contributing factors include CKD, stage 3, HTN, Obesity, OSA (has cpap), OA. Other pertinent medical hx include B neuropathy hands and feet, hx vein ligation and stripping s/p 5-6 yrs, cervicle and lumbar laminectomies, glaucoma and diabetic retinopathy    Limitations  BLE weakness, impaired balance, impaired sensation hands and feet, impaired vision, increased falls risk, decreased hip AROM, claudication    Repetition  Increases Symptoms    Special Tests  +Stemmer base of toes bilaterally    Patient Stated Goals  Want to stop these leg ulcers from returning, Get leg swelling better under control and keep symptoms from getting  worse.    Currently in Pain?  No/denies    Pain Onset  Other (comment)   >20 YEARS                  OT Treatments/Exercises (OP) - 07/14/19 0001      ADLs   ADL Education Given  Yes      Manual Therapy   Manual Therapy  Edema management;Compression Bandaging;Manual Lymphatic Drainage (MLD)    Manual Lymphatic Drainage (MLD)  MLDto RLEutilizing short neck sequence, deep abdominal pathway w/ diapgragmatic breathing and functional inguinal LN , nd proximal to distal J strokes to thigh leg and foot. Excellent tolerance.    Compression Bandaging  Added single roll of 12 cm wide Artiflex cast padding at distal leg under Rosidal to reduce discomfort at narrowest ankle when wraps tend to slide down and bunch up. RLE gradient compression wraps using 8, 10,and 12 cm short stretch wrapsover single layer of stockinett and 0.4 cm Rosidal foam. Knee length, excluding toes             OT Education - 07/14/19 1121    Education Details  Continued skilled Pt/caregiver education  And LE ADL training throughout visit for lymphedema self care/ home program, including compression wrapping, compression garment and device wear/care, lymphatic pumping ther ex, simple self-MLD, and skin care. Discussed progress towards goals.    Person(s) Educated  Patient;Child(ren)    Methods  Explanation;Demonstration;Handout    Comprehension  Verbalized understanding;Returned demonstration;Need further instruction          OT Long Term Goals - 07/14/19 1124      OT LONG TERM GOAL #1   Title  Pt will be able to apply BLE, knee length, multi-layer, short stretch compression wraps daily to one leg at a time using correct gradient techniques with MAXIMUM CAREGIVER ASSISTANCE   to achieve optimal limb volume reduction, to return affected limb/s, as closely as possible, to premorbid size and shape, to limit infection risk, and to improve safe functional ambulation and mobility.    Baseline  Dependent    Time   4    Period  Days    Status  Achieved      OT LONG TERM GOAL #2   Title  Pt will be able to verbalize signs and symptoms of cellulitis infection and identify at least 4 common lymphedema precautions using printed resource for reference to limit LE progression over time to limit risk of infection and LE exacerbation.    Baseline  Max A    Time  4    Period  Days    Status  Achieved      OT LONG TERM GOAL #3   Title  Pt will tolerate knee length,  multilayer short stretch compression wrap to single leg up to  23/ 7 to achieve optimal swelling reduction to arrest lymphedema progression and in preparation for fitting appropriate compression garments/ devices.    Baseline  Max A    Time  6    Period  Days    Status  Achieved      OT LONG TERM GOAL #4   Title  With max CG support daily for compression wrapping  Pt will achieve and sustain at least  85%  compliance with daily LE self-care home program (skin care, lymphatic pumping therex, compression and simple self-MLD) to reduce and control limb swelling, reduce infection risk and limit LE progression.    Baseline  Dependent    Time  12    Period  Weeks    Status  Partially Met      OT LONG TERM GOAL #5   Title  Pt will achieve at least 10% limb volume reduction in RLE and 5% reduction in LLE belowthe knees to reduce recurrent leg ulcers, reduce infection risk and limit LE progression.    Baseline  dependent    Time  12    Period  Weeks    Status  Partially Met            Plan - 07/14/19 1122    Clinical Impression Statement  Sara Bright tolerated manual therapy and compression wrap re application today without increased pain. He daughter , Sara Bright, remains a Education officer, community and Pt demonstrates progress towards all OT goals.We'll complete progress note and volumetrics for RLE next visit. Cont as per POC.    OT Occupational Profile and History  Comprehensive Assessment- Review of records and extensive additional review of  physical, cognitive, psychosocial history related to current functional performance    Occupational performance deficits (Please refer to evaluation for details):  ADL's;Leisure;Social Participation;IADL's;Work    Body Structure / Function / Physical Skills  ADL;Decreased knowledge of precautions;Flexibility;ROM;Mobility;Decreased knowledge of use of DME;Balance;Skin integrity;Wound;Edema;Pain;IADL    Rehab Potential  Good    Clinical Decision Making  Several treatment options, min-mod task modification necessary    Comorbidities Affecting Occupational Performance:  Presence of comorbidities impacting occupational performance    Comorbidities impacting occupational performance description:  see SUBJECTIVE for contibuting comorbidities    Modification or Assistance to Complete Evaluation   Max significant modification of tasks or assist is necessary to complete    OT Frequency  2x / week   2-3 x weekly x 12 weeks and prn   OT Duration  12 weeks   and PRN   OT Treatment/Interventions  Self-care/ADL training;Therapeutic exercise;Manual lymph drainage;Compression bandaging;Patient/family education;Other (comment);Therapeutic activities;Manual Therapy;DME and/or AE instruction   SKIN CARE   Plan  CDT: MLD, gradient wraps, ther ex, skin care,    OT Home Exercise Plan  Caregiver to apply compression wraps daily during visit intervals and assist with donning / doffing compression consistently    Recommended Other Services  Consider Velcro-style, adjustable , knee length Jobst, CircAid cmpression wraps as an alternative to traditional elastic stockings for ease of donning    Consulted and Agree with Plan of Care  Patient       Patient will benefit from skilled therapeutic intervention in order to improve the following deficits and impairments:   Body Structure / Function / Physical Skills: ADL, Decreased knowledge of precautions, Flexibility, ROM, Mobility, Decreased knowledge of use of DME, Balance,  Skin integrity, Wound, Edema, Pain, IADL       Visit Diagnosis: Lymphedema, not elsewhere classified    Problem  List Patient Active Problem List   Diagnosis Date Noted  . GERD (gastroesophageal reflux disease) 01/20/2019  . Hyperlipidemia 01/20/2019  . Claudication ([place]) 01/07/2019  . Venous ulcer of left leg ([place]) 08/13/2018  . Diabetic polyneuropathy associated with type 2 diabetes mellitus ([place]) 05/30/2018  . Macrocytosis 04/26/2018  . Fecal occult blood test positive 10/08/2017  . IDA (iron deficiency anemia) 10/08/2017  . Chronic venous insufficiency 12/25/2016  . Lymphedema 12/25/2016  . Diabetes ([place]) 12/25/2016  . Essential hypertension 12/25/2016  . Hx of adenomatous colonic polyps 10/22/2016  . DOE (dyspnea on exertion) 07/22/2016  . CKD (chronic kidney disease) stage 3, GFR 30-59 ml/min 06/23/2016  . Swelling of limb 12/31/2015  . Severe obesity (BMI >= 40) ([place]) 12/31/2015  . Varicose veins of both legs with edema 12/31/2015  . VV (varicose veins) 11/27/2015  . Other iron deficiency anemia 10/08/2015  . Anemia due to stage 3 chronic kidney disease 10/08/2015  . BMI 45.0-49.9, adult ([place]) 10/05/2015  . OSA on CPAP 03/10/2015  . Lumbar stenosis with neurogenic claudication 09/30/2011   Andrey Spearman, MS, OTR/L, [facility] 07/14/19 11:27 AM  [place] MAIN [facility] SERVICES [address] [place], [place], [address] Phone: [phone]   Fax:  [phone]  Name: TIAJAH OYSTER MRN: [id number] Date of Birth: Oct 09, 1940

## 2019-07-17 ENCOUNTER — Ambulatory Visit: Payer: Medicare HMO | Admitting: Occupational Therapy

## 2019-07-17 ENCOUNTER — Other Ambulatory Visit: Payer: Self-pay

## 2019-07-17 DIAGNOSIS — I890 Lymphedema, not elsewhere classified: Secondary | ICD-10-CM | POA: Diagnosis not present

## 2019-07-17 NOTE — Therapy (Addendum)
[place] MAIN [facility] SERVICES [address] [place], [place], [address] Phone: [phone]   Fax:  [phone]  Occupational Therapy Treatment  Patient Details  Name: Sara Bright MRN: [id number] Date of Birth: 1940/08/10 Referring Provider (OT): Eulogio Ditch, [place]   Encounter Date: 07/17/2019  OT End of Session - 07/17/19 0223    Visit Number  10    Number of Visits  36    Date for OT Re-Evaluation  09/03/19    OT Start Time  0106    OT Stop Time  0215    OT Time Calculation (min)  69 min    Activity Tolerance  Patient tolerated treatment well;No increased pain    Behavior During Therapy  WFL for tasks assessed/performed       Past Medical History:  Diagnosis Date  . Adenoma of colon   . Adenomatous colon polyp   . Anemia    IDA  . Cataract   . Cervical stenosis of spinal canal   . Chronic kidney disease    stage 3  . Diabetes mellitus without complication ([place])   . GERD (gastroesophageal reflux disease)   . Glaucoma   . Heme positive stool   . History of UTI   . Hx of gallstones   . Hyperkalemia   . Hyperlipidemia   . Hyperlipidemia   . Hypertension   . Lumbar stenosis   . Lymphedema   . Neuromuscular disorder ([place])   . Neuropathy associated with endocrine disorder ([place])   . Obesity   . OSA on CPAP    on CPAP  . Osteoarthritis   . Osteoarthritis   . Port-A-Cath in place   . Retinopathy due to secondary diabetes ([place])   . Secondary hyperparathyroidism of renal origin [facility])     Past Surgical History:  Procedure Laterality Date  . ACDF X2    . BACK SURGERY    . Cataract extraction right    . catarect extraction left    . CHOLECYSTECTOMY    . COLONOSCOPY WITH PROPOFOL N/A 12/02/2016   Procedure: COLONOSCOPY WITH PROPOFOL;  Surgeon: Manya Silvas, MD;  Location: [facility] ENDOSCOPY;  Service: Endoscopy;  Laterality: N/A;  . COLONOSCOPY WITH PROPOFOL N/A 11/10/2017   Procedure: COLONOSCOPY WITH PROPOFOL;  Surgeon:  Toledo, Benay Pike, MD;  Location: [facility] ENDOSCOPY;  Service: Gastroenterology;  Laterality: N/A;  . colonoscopy with removal lesions by snare    . Endoscoic carpal tunnel release    . ESOPHAGOGASTRODUODENOSCOPY N/A 11/10/2017   Procedure: ESOPHAGOGASTRODUODENOSCOPY (EGD);  Surgeon: Toledo, Benay Pike, MD;  Location: [facility] ENDOSCOPY;  Service: Gastroenterology;  Laterality: N/A;  . ESOPHAGOGASTRODUODENOSCOPY (EGD) WITH PROPOFOL N/A 12/02/2016   Procedure: ESOPHAGOGASTRODUODENOSCOPY (EGD) WITH PROPOFOL;  Surgeon: Manya Silvas, MD;  Location: [facility] ENDOSCOPY;  Service: Endoscopy;  Laterality: N/A;  . IR IMAGING GUIDED PORT INSERTION  10/01/2017  . Laminectomy posterior lumbar facetectomy and formaninotomy w/Decomp    . Laminectony posterior cervicle decomp w/Facectomy and foraminotomy    . VEIN LIGATION AND STRIPPING Left     There were no vitals filed for this visit.       LYMPHEDEMA/ONCOLOGY QUESTIONNAIRE - 07/17/19 1603      Right Lower Extremity Lymphedema   Other  LLE A-D ( ankle to knee) limb volume measures 4442.59m.    Other  LLE limb volume is DEcreased by 5.53% today since initiially measured on 06/13/19.              OT  Treatments/Exercises (OP) - 07/17/19 0001      ADLs   ADL Education Given  Yes      Manual Therapy   Manual Therapy  Edema management;Compression Bandaging;Manual Lymphatic Drainage (MLD)    Manual therapy comments  LLE volumetrics    Manual Lymphatic Drainage (MLD)  MLDto RLEutilizing short neck sequence, deep abdominal pathway w/ diapgragmatic breathing and functional inguinal LN , nd proximal to distal J strokes to thigh leg and foot. Excellent tolerance.    Compression Bandaging  Added single roll of 12 cm wide Artiflex cast padding at distal leg under Rosidal to reduce discomfort at narrowest ankle when wraps tend to slide down and bunch up. RLE gradient compression wraps using 8, 10,and 12 cm short stretch wrapsover single layer of stockinett and  0.4 cm Rosidal foam. Knee length, excluding toes             OT Education - 07/17/19 1608    Education Details  Continued skilled Pt/caregiver education  And LE ADL training throughout visit for lymphedema self care/ home program, including compression wrapping, compression garment and device wear/care, lymphatic pumping ther ex, simple self-MLD, and skin care. Discussed progress towards goals.    Person(s) Educated  Patient;Child(ren)    Methods  Explanation;Demonstration;Handout    Comprehension  Verbalized understanding;Returned demonstration;Need further instruction          OT Long Term Goals - 07/17/19 1610      OT LONG TERM GOAL #1   Title  Pt will be able to apply BLE, knee length, multi-layer, short stretch compression wraps daily to one leg at a time using correct gradient techniques with MAXIMUM CAREGIVER ASSISTANCE   to achieve optimal limb volume reduction, to return affected limb/s, as closely as possible, to premorbid size and shape, to limit infection risk, and to improve safe functional ambulation and mobility.    Baseline  Dependent    Time  4    Period  Days    Status  Achieved      OT LONG TERM GOAL #2   Title  Pt will be able to verbalize signs and symptoms of cellulitis infection and identify at least 4 common lymphedema precautions using printed resource for reference to limit LE progression over time to limit risk of infection and LE exacerbation.    Baseline  Max A    Time  4    Period  Days    Status  Achieved      OT LONG TERM GOAL #3   Title  Pt will tolerate knee length,  multilayer short stretch compression wrap to single leg up to 23/ 7 to achieve optimal swelling reduction to arrest lymphedema progression and in preparation for fitting appropriate compression garments/ devices.    Baseline  Max A    Time  6    Period  Days    Status  Achieved      OT LONG TERM GOAL #4   Title  With max CG support daily for compression wrapping  Pt will  achieve and sustain at least  85%  compliance with daily LE self-care home program (skin care, lymphatic pumping therex, compression and simple self-MLD) to reduce and control limb swelling, reduce infection risk and limit LE progression.    Baseline  Dependent    Time  12    Period  Weeks    Status  On-going      OT LONG TERM GOAL #5   Title  Pt will  achieve at least 10% limb volume reduction in RLE and 5% reduction in LLE belowthe knees to reduce recurrent leg ulcers, reduce infection risk and limit LE progression.   Good progress. 5.53% reduction measured 07/17/19   Baseline  dependent    Time  12    Period  Weeks    Status  Partially Met            Plan - 07/17/19 1609    Clinical Impression Statement  Completed LLE limb volumetrics today to determine progress towards volume reduction goal for most affected LLE. LLE limb volume is DEcreased by 5.53% today since initiially measured on 06/13/19. This value is 1/2 way to achieving 10% goal. Pt demonstrates steady progress towards all unmet LE self care goals. See LTG section for details. Cont as per POC.    OT Occupational Profile and History  Comprehensive Assessment- Review of records and extensive additional review of physical, cognitive, psychosocial history related to current functional performance    Occupational performance deficits (Please refer to evaluation for details):  ADL's;Leisure;Social Participation;IADL's;Work    Body Structure / Function / Physical Skills  ADL;Decreased knowledge of precautions;Flexibility;ROM;Mobility;Decreased knowledge of use of DME;Balance;Skin integrity;Wound;Edema;Pain;IADL    Rehab Potential  Good    Clinical Decision Making  Several treatment options, min-mod task modification necessary    Comorbidities Affecting Occupational Performance:  Presence of comorbidities impacting occupational performance    Comorbidities impacting occupational performance description:  see SUBJECTIVE for contibuting  comorbidities    Modification or Assistance to Complete Evaluation   Max significant modification of tasks or assist is necessary to complete    OT Frequency  2x / week   2-3 x weekly x 12 weeks and prn   OT Duration  12 weeks   and PRN   OT Treatment/Interventions  Self-care/ADL training;Therapeutic exercise;Manual lymph drainage;Compression bandaging;Patient/family education;Other (comment);Therapeutic activities;Manual Therapy;DME and/or AE instruction   SKIN CARE   Plan  CDT: MLD, gradient wraps, ther ex, skin care,    OT Home Exercise Plan  Caregiver to apply compression wraps daily during visit intervals and assist with donning / doffing compression consistently    Recommended Other Services  Consider Velcro-style, adjustable , knee length Jobst, CircAid cmpression wraps as an alternative to traditional elastic stockings for ease of donning    Consulted and Agree with Plan of Care  Patient       Patient will benefit from skilled therapeutic intervention in order to improve the following deficits and impairments:   Body Structure / Function / Physical Skills: ADL, Decreased knowledge of precautions, Flexibility, ROM, Mobility, Decreased knowledge of use of DME, Balance, Skin integrity, Wound, Edema, Pain, IADL       Visit Diagnosis: Lymphedema, not elsewhere classified    Problem List Patient Active Problem List   Diagnosis Date Noted  . GERD (gastroesophageal reflux disease) 01/20/2019  . Hyperlipidemia 01/20/2019  . Claudication ([place]) 01/07/2019  . Venous ulcer of left leg ([place]) 08/13/2018  . Diabetic polyneuropathy associated with type 2 diabetes mellitus ([place]) 05/30/2018  . Macrocytosis 04/26/2018  . Fecal occult blood test positive 10/08/2017  . IDA (iron deficiency anemia) 10/08/2017  . Chronic venous insufficiency 12/25/2016  . Lymphedema 12/25/2016  . Diabetes ([place]) 12/25/2016  . Essential hypertension 12/25/2016  . Hx of adenomatous colonic polyps 10/22/2016  .  DOE (dyspnea on exertion) 07/22/2016  . CKD (chronic kidney disease) stage 3, GFR 30-59 ml/min 06/23/2016  . Swelling of limb 12/31/2015  . Severe obesity (BMI >= 40) (  [place]) 12/31/2015  . Varicose veins of both legs with edema 12/31/2015  . VV (varicose veins) 11/27/2015  . Other iron deficiency anemia 10/08/2015  . Anemia due to stage 3 chronic kidney disease 10/08/2015  . BMI 45.0-49.9, adult ([place]) 10/05/2015  . OSA on CPAP 03/10/2015  . Lumbar stenosis with neurogenic claudication 09/30/2011    Andrey Spearman, MS, OTR/L, [facility] 07/19/19 11:38 AM  [place] MAIN [facility] SERVICES [address] [place], [place], [address] Phone: [phone]   Fax:  [phone]  Name: Sara Bright MRN: [id number] Date of Birth: 12-Jul-1940

## 2019-07-18 ENCOUNTER — Inpatient Hospital Stay: Payer: Medicare HMO

## 2019-07-18 ENCOUNTER — Encounter: Payer: Self-pay | Admitting: Hematology and Oncology

## 2019-07-18 ENCOUNTER — Inpatient Hospital Stay: Payer: Medicare HMO | Attending: Hematology and Oncology | Admitting: Hematology and Oncology

## 2019-07-18 ENCOUNTER — Other Ambulatory Visit: Payer: Self-pay

## 2019-07-18 VITALS — BP 162/55 | HR 73 | Temp 96.7°F | Resp 20 | Wt 263.6 lb

## 2019-07-18 DIAGNOSIS — R0602 Shortness of breath: Secondary | ICD-10-CM | POA: Diagnosis not present

## 2019-07-18 DIAGNOSIS — Z841 Family history of disorders of kidney and ureter: Secondary | ICD-10-CM | POA: Insufficient documentation

## 2019-07-18 DIAGNOSIS — I129 Hypertensive chronic kidney disease with stage 1 through stage 4 chronic kidney disease, or unspecified chronic kidney disease: Secondary | ICD-10-CM | POA: Insufficient documentation

## 2019-07-18 DIAGNOSIS — Z79899 Other long term (current) drug therapy: Secondary | ICD-10-CM | POA: Diagnosis not present

## 2019-07-18 DIAGNOSIS — N1832 Chronic kidney disease, stage 3b: Secondary | ICD-10-CM | POA: Diagnosis not present

## 2019-07-18 DIAGNOSIS — Z833 Family history of diabetes mellitus: Secondary | ICD-10-CM | POA: Diagnosis not present

## 2019-07-18 DIAGNOSIS — D631 Anemia in chronic kidney disease: Secondary | ICD-10-CM | POA: Diagnosis not present

## 2019-07-18 DIAGNOSIS — Z811 Family history of alcohol abuse and dependence: Secondary | ICD-10-CM | POA: Diagnosis not present

## 2019-07-18 DIAGNOSIS — D7589 Other specified diseases of blood and blood-forming organs: Secondary | ICD-10-CM | POA: Diagnosis not present

## 2019-07-18 DIAGNOSIS — Z86010 Personal history of colonic polyps: Secondary | ICD-10-CM | POA: Diagnosis not present

## 2019-07-18 DIAGNOSIS — N183 Chronic kidney disease, stage 3 unspecified: Secondary | ICD-10-CM

## 2019-07-18 DIAGNOSIS — Z8051 Family history of malignant neoplasm of kidney: Secondary | ICD-10-CM | POA: Diagnosis not present

## 2019-07-18 DIAGNOSIS — Z823 Family history of stroke: Secondary | ICD-10-CM | POA: Diagnosis not present

## 2019-07-18 DIAGNOSIS — Z8249 Family history of ischemic heart disease and other diseases of the circulatory system: Secondary | ICD-10-CM | POA: Insufficient documentation

## 2019-07-18 LAB — BASIC METABOLIC PANEL
Anion gap: 7 (ref 5–15)
BUN: 50 mg/dL — ABNORMAL HIGH (ref 8–23)
CO2: 22 mmol/L (ref 22–32)
Calcium: 9.8 mg/dL (ref 8.9–10.3)
Chloride: 109 mmol/L (ref 98–111)
Creatinine, Ser: 1.79 mg/dL — ABNORMAL HIGH (ref 0.44–1.00)
GFR calc Af Amer: 30 mL/min — ABNORMAL LOW (ref >60)
GFR calc non Af Amer: 26 mL/min — ABNORMAL LOW (ref >60)
Glucose, Bld: 111 mg/dL — ABNORMAL HIGH (ref 70–99)
Potassium: 5.3 mmol/L — ABNORMAL HIGH (ref 3.5–5.1)
Sodium: 138 mmol/L (ref 135–145)

## 2019-07-18 LAB — CBC WITH DIFFERENTIAL/PLATELET
Abs Immature Granulocytes: 0.01 10*3/uL (ref 0.00–0.07)
Basophils Absolute: 0.1 10*3/uL (ref 0.0–0.1)
Basophils Relative: 1 %
Eosinophils Absolute: 0.2 10*3/uL (ref 0.0–0.5)
Eosinophils Relative: 4 %
HCT: 31 % — ABNORMAL LOW (ref 36.0–46.0)
Hemoglobin: 9.4 g/dL — ABNORMAL LOW (ref 12.0–15.0)
Immature Granulocytes: 0 %
Lymphocytes Relative: 29 %
Lymphs Abs: 1.1 10*3/uL (ref 0.7–4.0)
MCH: 31.4 pg (ref 26.0–34.0)
MCHC: 30.3 g/dL (ref 30.0–36.0)
MCV: 103.7 fL — ABNORMAL HIGH (ref 80.0–100.0)
Monocytes Absolute: 0.5 10*3/uL (ref 0.1–1.0)
Monocytes Relative: 13 %
Neutro Abs: 2 10*3/uL (ref 1.7–7.7)
Neutrophils Relative %: 53 %
Platelets: 142 10*3/uL — ABNORMAL LOW (ref 150–400)
RBC: 2.99 MIL/uL — ABNORMAL LOW (ref 3.87–5.11)
RDW: 15.1 % (ref 11.5–15.5)
WBC: 3.8 10*3/uL — ABNORMAL LOW (ref 4.0–10.5)
nRBC: 0 % (ref 0.0–0.2)

## 2019-07-18 LAB — FERRITIN: Ferritin: 153 ng/mL (ref 11–307)

## 2019-07-18 MED ORDER — EPOETIN ALFA-EPBX 10000 UNIT/ML IJ SOLN
10000.0000 [IU] | Freq: Once | INTRAMUSCULAR | Status: AC
  Administered 2019-07-18: 10000 [IU] via SUBCUTANEOUS
  Filled 2019-07-18: qty 1

## 2019-07-18 NOTE — Progress Notes (Signed)
Patient states she has her legs wrapped to help with the swelling and ulcers. She does still get SOB with exertion but she sees her pulmonologist next week.

## 2019-07-19 ENCOUNTER — Inpatient Hospital Stay: Payer: Medicare HMO | Attending: Hematology and Oncology

## 2019-07-19 ENCOUNTER — Telehealth: Payer: Self-pay

## 2019-07-19 DIAGNOSIS — N1832 Chronic kidney disease, stage 3b: Secondary | ICD-10-CM | POA: Insufficient documentation

## 2019-07-19 DIAGNOSIS — Z95828 Presence of other vascular implants and grafts: Secondary | ICD-10-CM

## 2019-07-19 DIAGNOSIS — D7589 Other specified diseases of blood and blood-forming organs: Secondary | ICD-10-CM

## 2019-07-19 DIAGNOSIS — D631 Anemia in chronic kidney disease: Secondary | ICD-10-CM | POA: Insufficient documentation

## 2019-07-19 LAB — POTASSIUM: Potassium: 5 mmol/L (ref 3.5–5.1)

## 2019-07-19 MED ORDER — HEPARIN SOD (PORK) LOCK FLUSH 100 UNIT/ML IV SOLN
500.0000 [IU] | Freq: Once | INTRAVENOUS | Status: AC
  Administered 2019-07-19: 500 [IU] via INTRAVENOUS
  Filled 2019-07-19: qty 5

## 2019-07-19 NOTE — Telephone Encounter (Signed)
Routed potassium lab results to patient PCP

## 2019-07-20 ENCOUNTER — Ambulatory Visit: Payer: Medicare HMO | Admitting: Occupational Therapy

## 2019-07-20 ENCOUNTER — Other Ambulatory Visit: Payer: Self-pay

## 2019-07-20 DIAGNOSIS — I890 Lymphedema, not elsewhere classified: Secondary | ICD-10-CM

## 2019-07-20 NOTE — Therapy (Signed)
[place] MAIN [facility] SERVICES [address] [place], [place], [address] Phone: [phone]   Fax:  [phone]  Occupational Therapy Treatment  Patient Details  Name: Sara Bright MRN: [id number] Date of Birth: 07-19-1940 Referring Provider (OT): Eulogio Ditch, [place]   Encounter Date: 07/20/2019  OT End of Session - 07/20/19 1437    Visit Number  11    Number of Visits  36    Date for OT Re-Evaluation  09/03/19    OT Start Time  1000    OT Stop Time  1103    OT Time Calculation (min)  63 min    Activity Tolerance  Patient tolerated treatment well;No increased pain    Behavior During Therapy  WFL for tasks assessed/performed       Past Medical History:  Diagnosis Date  . Adenoma of colon   . Adenomatous colon polyp   . Anemia    IDA  . Cataract   . Cervical stenosis of spinal canal   . Chronic kidney disease    stage 3  . Diabetes mellitus without complication ([place])   . GERD (gastroesophageal reflux disease)   . Glaucoma   . Heme positive stool   . History of UTI   . Hx of gallstones   . Hyperkalemia   . Hyperlipidemia   . Hyperlipidemia   . Hypertension   . Lumbar stenosis   . Lymphedema   . Neuromuscular disorder ([place])   . Neuropathy associated with endocrine disorder ([place])   . Obesity   . OSA on CPAP    on CPAP  . Osteoarthritis   . Osteoarthritis   . Port-A-Cath in place   . Retinopathy due to secondary diabetes ([place])   . Secondary hyperparathyroidism of renal origin [facility])     Past Surgical History:  Procedure Laterality Date  . ACDF X2    . BACK SURGERY    . Cataract extraction right    . catarect extraction left    . CHOLECYSTECTOMY    . COLONOSCOPY WITH PROPOFOL N/A 12/02/2016   Procedure: COLONOSCOPY WITH PROPOFOL;  Surgeon: Manya Silvas, MD;  Location: [facility] ENDOSCOPY;  Service: Endoscopy;  Laterality: N/A;  . COLONOSCOPY WITH PROPOFOL N/A 11/10/2017   Procedure: COLONOSCOPY WITH PROPOFOL;  Surgeon:  Toledo, Benay Pike, MD;  Location: [facility] ENDOSCOPY;  Service: Gastroenterology;  Laterality: N/A;  . colonoscopy with removal lesions by snare    . Endoscoic carpal tunnel release    . ESOPHAGOGASTRODUODENOSCOPY N/A 11/10/2017   Procedure: ESOPHAGOGASTRODUODENOSCOPY (EGD);  Surgeon: Toledo, Benay Pike, MD;  Location: [facility] ENDOSCOPY;  Service: Gastroenterology;  Laterality: N/A;  . ESOPHAGOGASTRODUODENOSCOPY (EGD) WITH PROPOFOL N/A 12/02/2016   Procedure: ESOPHAGOGASTRODUODENOSCOPY (EGD) WITH PROPOFOL;  Surgeon: Manya Silvas, MD;  Location: [facility] ENDOSCOPY;  Service: Endoscopy;  Laterality: N/A;  . IR IMAGING GUIDED PORT INSERTION  10/01/2017  . Laminectomy posterior lumbar facetectomy and formaninotomy w/Decomp    . Laminectony posterior cervicle decomp w/Facectomy and foraminotomy    . VEIN LIGATION AND STRIPPING Left     There were no vitals filed for this visit.  Subjective Assessment - 07/20/19 1012    Subjective   Mrs Happel presents for OT visit 10/36 to address BLE lymphedema. Pt is unaccompanied. Pt denies leg pain. She presents with gradient compression wraps on LLE this morning. Her daughter and son in law contine to assist with compression wrapping between visits.    Patient is accompanied by:  Family member  Pertinent History  Chronic leg swelling and associated pain > 20 years; , Hx recurrent  ulcers, hx of lymphorrhea, Contributing factors include CKD, stage 3, HTN, Obesity, OSA (has cpap), OA. Other pertinent medical hx include B neuropathy hands and feet, hx vein ligation and stripping s/p 5-6 yrs, cervicle and lumbar laminectomies, glaucoma and diabetic retinopathy    Limitations  BLE weakness, impaired balance, impaired sensation hands and feet, impaired vision, increased falls risk, decreased hip AROM, claudication    Repetition  Increases Symptoms    Special Tests  +Stemmer base of toes bilaterally    Patient Stated Goals  Want to stop these leg ulcers from returning, Get leg  swelling better under control and keep symptoms from getting worse.    Pain Onset  Other (comment)   >20 YEARS                  OT Treatments/Exercises (OP) - 07/20/19 0001      ADLs   ADL Education Given  Yes  (Pended)       Manual Therapy   Manual Therapy  Edema management;Compression Bandaging;Manual Lymphatic Drainage (MLD)  (Pended)     Edema Management  skin care to LLE with low ph castor oil during MLD  (Pended)     Manual Lymphatic Drainage (MLD)  MLD to LLE as established  (Pended)     Compression Bandaging  Added single roll of 12 cm wide Artiflex cast padding at distal leg under Rosidal to reduce discomfort at narrowest ankle when wraps tend to slide down and bunch up. RLE gradient compression wraps using 8, 10,and 12 cm short stretch wrapsover single layer of stockinett and 0.4 cm Rosidal foam. Knee length, excluding toes  (Pended)              OT Education - 07/20/19 1437    Education Details  Continued skilled Pt/caregiver education  And LE ADL training throughout visit for lymphedema self care/ home program, including compression wrapping, compression garment and device wear/care, lymphatic pumping ther ex, simple self-MLD, and skin care. Discussed progress towards goals.    Person(s) Educated  Patient;Child(ren)    Methods  Explanation;Demonstration;Handout    Comprehension  Verbalized understanding;Returned demonstration;Need further instruction          OT Long Term Goals - 07/17/19 1610      OT LONG TERM GOAL #1   Title  Pt will be able to apply BLE, knee length, multi-layer, short stretch compression wraps daily to one leg at a time using correct gradient techniques with MAXIMUM CAREGIVER ASSISTANCE   to achieve optimal limb volume reduction, to return affected limb/s, as closely as possible, to premorbid size and shape, to limit infection risk, and to improve safe functional ambulation and mobility.    Baseline  Dependent    Time  4    Period   Days    Status  Achieved      OT LONG TERM GOAL #2   Title  Pt will be able to verbalize signs and symptoms of cellulitis infection and identify at least 4 common lymphedema precautions using printed resource for reference to limit LE progression over time to limit risk of infection and LE exacerbation.    Baseline  Max A    Time  4    Period  Days    Status  Achieved      OT LONG TERM GOAL #3   Title  Pt will tolerate knee length,  multilayer short  stretch compression wrap to single leg up to 23/ 7 to achieve optimal swelling reduction to arrest lymphedema progression and in preparation for fitting appropriate compression garments/ devices.    Baseline  Max A    Time  6    Period  Days    Status  Achieved      OT LONG TERM GOAL #4   Title  With max CG support daily for compression wrapping  Pt will achieve and sustain at least  85%  compliance with daily LE self-care home program (skin care, lymphatic pumping therex, compression and simple self-MLD) to reduce and control limb swelling, reduce infection risk and limit LE progression.    Baseline  Dependent    Time  12    Period  Weeks    Status  On-going      OT LONG TERM GOAL #5   Title  Pt will achieve at least 10% limb volume reduction in RLE and 5% reduction in LLE belowthe knees to reduce recurrent leg ulcers, reduce infection risk and limit LE progression.   Good progress. 5.53% reduction measured 07/17/19   Baseline  dependent    Time  12    Period  Weeks    Status  Partially Met            Plan - 07/20/19 1438    Clinical Impression Statement  Mrs. Boxley tolerated manual therapy and compression wraps as established to LLE. Skin is well hydrated. With decreased fluid congestion induration is much more palpable. Pt continues to demonstrate steady progress towards all goals.    OT Occupational Profile and History  Comprehensive Assessment- Review of records and extensive additional review of physical, cognitive,  psychosocial history related to current functional performance    Occupational performance deficits (Please refer to evaluation for details):  ADL's;Leisure;Social Participation;IADL's;Work    Body Structure / Function / Physical Skills  ADL;Decreased knowledge of precautions;Flexibility;ROM;Mobility;Decreased knowledge of use of DME;Balance;Skin integrity;Wound;Edema;Pain;IADL    Rehab Potential  Good    Clinical Decision Making  Several treatment options, min-mod task modification necessary    Comorbidities Affecting Occupational Performance:  Presence of comorbidities impacting occupational performance    Comorbidities impacting occupational performance description:  see SUBJECTIVE for contibuting comorbidities    Modification or Assistance to Complete Evaluation   Max significant modification of tasks or assist is necessary to complete    OT Frequency  2x / week   2-3 x weekly x 12 weeks and prn   OT Duration  12 weeks   and PRN   OT Treatment/Interventions  Self-care/ADL training;Therapeutic exercise;Manual lymph drainage;Compression bandaging;Patient/family education;Other (comment);Therapeutic activities;Manual Therapy;DME and/or AE instruction   SKIN CARE   Plan  CDT: MLD, gradient wraps, ther ex, skin care,    OT Home Exercise Plan  Caregiver to apply compression wraps daily during visit intervals and assist with donning / doffing compression consistently    Recommended Other Services  Consider Velcro-style, adjustable , knee length Jobst, CircAid cmpression wraps as an alternative to traditional elastic stockings for ease of donning    Consulted and Agree with Plan of Care  Patient       Patient will benefit from skilled therapeutic intervention in order to improve the following deficits and impairments:   Body Structure / Function / Physical Skills: ADL, Decreased knowledge of precautions, Flexibility, ROM, Mobility, Decreased knowledge of use of DME, Balance, Skin integrity, Wound,  Edema, Pain, IADL       Visit Diagnosis: Lymphedema, not elsewhere  classified    Problem List Patient Active Problem List   Diagnosis Date Noted  . GERD (gastroesophageal reflux disease) 01/20/2019  . Hyperlipidemia 01/20/2019  . Claudication ([place]) 01/07/2019  . Venous ulcer of left leg ([place]) 08/13/2018  . Diabetic polyneuropathy associated with type 2 diabetes mellitus ([place]) 05/30/2018  . Macrocytosis 04/26/2018  . Fecal occult blood test positive 10/08/2017  . IDA (iron deficiency anemia) 10/08/2017  . Chronic venous insufficiency 12/25/2016  . Lymphedema 12/25/2016  . Diabetes ([place]) 12/25/2016  . Essential hypertension 12/25/2016  . Hx of adenomatous colonic polyps 10/22/2016  . DOE (dyspnea on exertion) 07/22/2016  . CKD (chronic kidney disease) stage 3, GFR 30-59 ml/min 06/23/2016  . Swelling of limb 12/31/2015  . Severe obesity (BMI >= 40) ([place]) 12/31/2015  . Varicose veins of both legs with edema 12/31/2015  . VV (varicose veins) 11/27/2015  . Other iron deficiency anemia 10/08/2015  . Anemia due to stage 3 chronic kidney disease 10/08/2015  . BMI 45.0-49.9, adult ([place]) 10/05/2015  . OSA on CPAP 03/10/2015  . Lumbar stenosis with neurogenic claudication 09/30/2011    Andrey Spearman, MS, OTR/L, [facility] 07/20/19 2:44 PM  [place] MAIN [facility] SERVICES [address] [place], [place], [address] Phone: [phone]   Fax:  [phone]  Name: Sara Bright MRN: [id number] Date of Birth: 05/10/1940

## 2019-07-21 ENCOUNTER — Ambulatory Visit (INDEPENDENT_AMBULATORY_CARE_PROVIDER_SITE_OTHER): Payer: Medicare HMO | Admitting: Vascular Surgery

## 2019-07-25 ENCOUNTER — Ambulatory Visit: Payer: Medicare HMO | Admitting: Occupational Therapy

## 2019-07-25 ENCOUNTER — Other Ambulatory Visit: Payer: Self-pay

## 2019-07-25 DIAGNOSIS — I890 Lymphedema, not elsewhere classified: Secondary | ICD-10-CM

## 2019-07-25 NOTE — Therapy (Signed)
[place] MAIN [facility] SERVICES [address] [place], [place], [address] Phone: [phone]   Fax:  [phone]  Occupational Therapy Treatment  Patient Details  Name: Sara Bright MRN: [id number] Date of Birth: Jul 18, 1940 Referring Provider (OT): Eulogio Ditch, [place]   Encounter Date: 07/25/2019  OT End of Session - 07/25/19 1515    Visit Number  12    Number of Visits  36    Date for OT Re-Evaluation  09/03/19    OT Start Time  1000    OT Stop Time  1100    OT Time Calculation (min)  60 min    Activity Tolerance  Patient tolerated treatment well;No increased pain    Behavior During Therapy  WFL for tasks assessed/performed       Past Medical History:  Diagnosis Date  . Adenoma of colon   . Adenomatous colon polyp   . Anemia    IDA  . Cataract   . Cervical stenosis of spinal canal   . Chronic kidney disease    stage 3  . Diabetes mellitus without complication ([place])   . GERD (gastroesophageal reflux disease)   . Glaucoma   . Heme positive stool   . History of UTI   . Hx of gallstones   . Hyperkalemia   . Hyperlipidemia   . Hyperlipidemia   . Hypertension   . Lumbar stenosis   . Lymphedema   . Neuromuscular disorder ([place])   . Neuropathy associated with endocrine disorder ([place])   . Obesity   . OSA on CPAP    on CPAP  . Osteoarthritis   . Osteoarthritis   . Port-A-Cath in place   . Retinopathy due to secondary diabetes ([place])   . Secondary hyperparathyroidism of renal origin [facility])     Past Surgical History:  Procedure Laterality Date  . ACDF X2    . BACK SURGERY    . Cataract extraction right    . catarect extraction left    . CHOLECYSTECTOMY    . COLONOSCOPY WITH PROPOFOL N/A 12/02/2016   Procedure: COLONOSCOPY WITH PROPOFOL;  Surgeon: Manya Silvas, MD;  Location: [facility] ENDOSCOPY;  Service: Endoscopy;  Laterality: N/A;  . COLONOSCOPY WITH PROPOFOL N/A 11/10/2017   Procedure: COLONOSCOPY WITH PROPOFOL;  Surgeon:  Toledo, Benay Pike, MD;  Location: [facility] ENDOSCOPY;  Service: Gastroenterology;  Laterality: N/A;  . colonoscopy with removal lesions by snare    . Endoscoic carpal tunnel release    . ESOPHAGOGASTRODUODENOSCOPY N/A 11/10/2017   Procedure: ESOPHAGOGASTRODUODENOSCOPY (EGD);  Surgeon: Toledo, Benay Pike, MD;  Location: [facility] ENDOSCOPY;  Service: Gastroenterology;  Laterality: N/A;  . ESOPHAGOGASTRODUODENOSCOPY (EGD) WITH PROPOFOL N/A 12/02/2016   Procedure: ESOPHAGOGASTRODUODENOSCOPY (EGD) WITH PROPOFOL;  Surgeon: Manya Silvas, MD;  Location: [facility] ENDOSCOPY;  Service: Endoscopy;  Laterality: N/A;  . IR IMAGING GUIDED PORT INSERTION  10/01/2017  . Laminectomy posterior lumbar facetectomy and formaninotomy w/Decomp    . Laminectony posterior cervicle decomp w/Facectomy and foraminotomy    . VEIN LIGATION AND STRIPPING Left     There were no vitals filed for this visit.  Subjective Assessment - 07/25/19 1516    Subjective   [name] presents for OT visit 12/36 to address BLE lymphedema. Pt is unaccompanied. Pt denies leg pain. She presents with gradient compression wraps on LLE this morning.    Patient is accompanied by:  Family member    Pertinent History  Chronic leg swelling and associated pain > 20 years; ,  Hx recurrent  ulcers, hx of lymphorrhea, Contributing factors include CKD, stage 3, HTN, Obesity, OSA (has cpap), OA. Other pertinent medical hx include B neuropathy hands and feet, hx vein ligation and stripping s/p 5-6 yrs, cervicle and lumbar laminectomies, glaucoma and diabetic retinopathy    Limitations  BLE weakness, impaired balance, impaired sensation hands and feet, impaired vision, increased falls risk, decreased hip AROM, claudication    Repetition  Increases Symptoms    Special Tests  +Stemmer base of toes bilaterally    Patient Stated Goals  Want to stop these leg ulcers from returning, Get leg swelling better under control and keep symptoms from getting worse.    Pain Onset   Other (comment)   >20 YEARS                  OT Treatments/Exercises (OP) - 07/25/19 0001      ADLs   ADL Education Given  Yes      Manual Therapy   Manual Therapy  Edema management;Compression Bandaging    Compression Bandaging  Loaned LLE CircAid adjustable Velcro style compression wrap for visit interval.             OT Education - 07/25/19 1518    Education Details  Pt edu for donning and doffing adjustable short stretch CircAid compression wrap using multimple positions    Person(s) Educated  Patient;Child(ren)    Methods  Explanation;Demonstration;Handout    Comprehension  Verbalized understanding;Returned demonstration;Need further instruction          OT Long Term Goals - 07/17/19 1610      OT LONG TERM GOAL #1   Title  Pt will be able to apply BLE, knee length, multi-layer, short stretch compression wraps daily to one leg at a time using correct gradient techniques with MAXIMUM CAREGIVER ASSISTANCE   to achieve optimal limb volume reduction, to return affected limb/s, as closely as possible, to premorbid size and shape, to limit infection risk, and to improve safe functional ambulation and mobility.    Baseline  Dependent    Time  4    Period  Days    Status  Achieved      OT LONG TERM GOAL #2   Title  Pt will be able to verbalize signs and symptoms of cellulitis infection and identify at least 4 common lymphedema precautions using printed resource for reference to limit LE progression over time to limit risk of infection and LE exacerbation.    Baseline  Max A    Time  4    Period  Days    Status  Achieved      OT LONG TERM GOAL #3   Title  Pt will tolerate knee length,  multilayer short stretch compression wrap to single leg up to 23/ 7 to achieve optimal swelling reduction to arrest lymphedema progression and in preparation for fitting appropriate compression garments/ devices.    Baseline  Max A    Time  6    Period  Days    Status   Achieved      OT LONG TERM GOAL #4   Title  With max CG support daily for compression wrapping  Pt will achieve and sustain at least  85%  compliance with daily LE self-care home program (skin care, lymphatic pumping therex, compression and simple self-MLD) to reduce and control limb swelling, reduce infection risk and limit LE progression.    Baseline  Dependent    Time  12  Period  Weeks    Status  On-going      OT LONG TERM GOAL #5   Title  Pt will achieve at least 10% limb volume reduction in RLE and 5% reduction in LLE belowthe knees to reduce recurrent leg ulcers, reduce infection risk and limit LE progression.   Good progress. 5.53% reduction measured 07/17/19   Baseline  dependent    Time  12    Period  Weeks    Status  Partially Met            Plan - 07/25/19 1519    Clinical Impression Statement  After skilled teaching Pt able to don and doff knee length, adjustable, short stretch, Velcro style compression wrap with mod A. Pt interested in exploring this daytime compression option as it enables her to manage compression with greatest level of independence. Concern with this method is difficulty fiting adjustable foot piece and also difficulty retaining gradient transition between foot and leg piece. Pt will trial device and we'll assess for effectiveness and functionality next visit. We'll also explore assitive devices for traditional elastic compression garments.    OT Occupational Profile and History  Comprehensive Assessment- Review of records and extensive additional review of physical, cognitive, psychosocial history related to current functional performance    Occupational performance deficits (Please refer to evaluation for details):  ADL's;Leisure;Social Participation;IADL's;Work    Body Structure / Function / Physical Skills  ADL;Decreased knowledge of precautions;Flexibility;ROM;Mobility;Decreased knowledge of use of DME;Balance;Skin integrity;Wound;Edema;Pain;IADL     Rehab Potential  Good    Clinical Decision Making  Several treatment options, min-mod task modification necessary    Comorbidities Affecting Occupational Performance:  Presence of comorbidities impacting occupational performance    Comorbidities impacting occupational performance description:  see SUBJECTIVE for contibuting comorbidities    Modification or Assistance to Complete Evaluation   Max significant modification of tasks or assist is necessary to complete    OT Frequency  2x / week   2-3 x weekly x 12 weeks and prn   OT Duration  12 weeks   and PRN   OT Treatment/Interventions  Self-care/ADL training;Therapeutic exercise;Manual lymph drainage;Compression bandaging;Patient/family education;Other (comment);Therapeutic activities;Manual Therapy;DME and/or AE instruction   SKIN CARE   Plan  CDT: MLD, gradient wraps, ther ex, skin care,    OT Home Exercise Plan  Caregiver to apply compression wraps daily during visit intervals and assist with donning / doffing compression consistently    Recommended Other Services  Consider Velcro-style, adjustable , knee length Jobst, CircAid cmpression wraps as an alternative to traditional elastic stockings for ease of donning    Consulted and Agree with Plan of Care  Patient       Patient will benefit from skilled therapeutic intervention in order to improve the following deficits and impairments:   Body Structure / Function / Physical Skills: ADL, Decreased knowledge of precautions, Flexibility, ROM, Mobility, Decreased knowledge of use of DME, Balance, Skin integrity, Wound, Edema, Pain, IADL       Visit Diagnosis: Lymphedema, not elsewhere classified    Problem List Patient Active Problem List   Diagnosis Date Noted  . GERD (gastroesophageal reflux disease) 01/20/2019  . Hyperlipidemia 01/20/2019  . Claudication ([place]) 01/07/2019  . Venous ulcer of left leg ([place]) 08/13/2018  . Diabetic polyneuropathy associated with type 2 diabetes  mellitus ([place]) 05/30/2018  . Macrocytosis 04/26/2018  . Fecal occult blood test positive 10/08/2017  . IDA (iron deficiency anemia) 10/08/2017  . Chronic venous insufficiency 12/25/2016  .  Lymphedema 12/25/2016  . Diabetes ([place]) 12/25/2016  . Essential hypertension 12/25/2016  . Hx of adenomatous colonic polyps 10/22/2016  . DOE (dyspnea on exertion) 07/22/2016  . CKD (chronic kidney disease) stage 3, GFR 30-59 ml/min 06/23/2016  . Swelling of limb 12/31/2015  . Severe obesity (BMI >= 40) ([place]) 12/31/2015  . Varicose veins of both legs with edema 12/31/2015  . VV (varicose veins) 11/27/2015  . Other iron deficiency anemia 10/08/2015  . Anemia due to stage 3 chronic kidney disease 10/08/2015  . BMI 45.0-49.9, adult ([place]) 10/05/2015  . OSA on CPAP 03/10/2015  . Lumbar stenosis with neurogenic claudication 09/30/2011    Andrey Spearman, MS, OTR/L, [facility] 07/25/19 3:23 PM  [place] MAIN [facility] SERVICES [address] [place], [place], [address] Phone: [phone]   Fax:  [phone]  Name: LAKIYA COTTAM MRN: [id number] Date of Birth: 04-Jun-1940

## 2019-07-27 ENCOUNTER — Ambulatory Visit: Payer: Medicare HMO | Admitting: Occupational Therapy

## 2019-07-27 ENCOUNTER — Other Ambulatory Visit: Payer: Self-pay

## 2019-07-27 DIAGNOSIS — I890 Lymphedema, not elsewhere classified: Secondary | ICD-10-CM

## 2019-07-27 NOTE — Therapy (Signed)
[place] MAIN [facility] SERVICES [address] [place], [place], [address] Phone: [phone]   Fax:  [phone]  Occupational Therapy Treatment  Patient Details  Name: Sara Bright MRN: [id number] Date of Birth: 1941/02/14 Referring Provider (OT): Eulogio Ditch, [place]   Encounter Date: 07/27/2019  OT End of Session - 07/27/19 1303    Visit Number  13    Number of Visits  36    Date for OT Re-Evaluation  09/03/19    OT Start Time  1000    OT Stop Time  1115    OT Time Calculation (min)  75 min    Activity Tolerance  Patient tolerated treatment well;No increased pain    Behavior During Therapy  WFL for tasks assessed/performed       Past Medical History:  Diagnosis Date  . Adenoma of colon   . Adenomatous colon polyp   . Anemia    IDA  . Cataract   . Cervical stenosis of spinal canal   . Chronic kidney disease    stage 3  . Diabetes mellitus without complication ([place])   . GERD (gastroesophageal reflux disease)   . Glaucoma   . Heme positive stool   . History of UTI   . Hx of gallstones   . Hyperkalemia   . Hyperlipidemia   . Hyperlipidemia   . Hypertension   . Lumbar stenosis   . Lymphedema   . Neuromuscular disorder ([place])   . Neuropathy associated with endocrine disorder ([place])   . Obesity   . OSA on CPAP    on CPAP  . Osteoarthritis   . Osteoarthritis   . Port-A-Cath in place   . Retinopathy due to secondary diabetes ([place])   . Secondary hyperparathyroidism of renal origin [facility])     Past Surgical History:  Procedure Laterality Date  . ACDF X2    . BACK SURGERY    . Cataract extraction right    . catarect extraction left    . CHOLECYSTECTOMY    . COLONOSCOPY WITH PROPOFOL N/A 12/02/2016   Procedure: COLONOSCOPY WITH PROPOFOL;  Surgeon: Manya Silvas, MD;  Location: [facility] ENDOSCOPY;  Service: Endoscopy;  Laterality: N/A;  . COLONOSCOPY WITH PROPOFOL N/A 11/10/2017   Procedure: COLONOSCOPY WITH PROPOFOL;  Surgeon:  Toledo, Benay Pike, MD;  Location: [facility] ENDOSCOPY;  Service: Gastroenterology;  Laterality: N/A;  . colonoscopy with removal lesions by snare    . Endoscoic carpal tunnel release    . ESOPHAGOGASTRODUODENOSCOPY N/A 11/10/2017   Procedure: ESOPHAGOGASTRODUODENOSCOPY (EGD);  Surgeon: Toledo, Benay Pike, MD;  Location: [facility] ENDOSCOPY;  Service: Gastroenterology;  Laterality: N/A;  . ESOPHAGOGASTRODUODENOSCOPY (EGD) WITH PROPOFOL N/A 12/02/2016   Procedure: ESOPHAGOGASTRODUODENOSCOPY (EGD) WITH PROPOFOL;  Surgeon: Manya Silvas, MD;  Location: [facility] ENDOSCOPY;  Service: Endoscopy;  Laterality: N/A;  . IR IMAGING GUIDED PORT INSERTION  10/01/2017  . Laminectomy posterior lumbar facetectomy and formaninotomy w/Decomp    . Laminectony posterior cervicle decomp w/Facectomy and foraminotomy    . VEIN LIGATION AND STRIPPING Left     There were no vitals filed for this visit.  Subjective Assessment - 07/27/19 1256    Subjective   Sara Bright presents for OT visit 13/36 to address BLE lymphedema. Pt is unaccompanied. Pt denies leg pain. She presents with loaned CircAid short stretch leg wraps in place on LLE. Pt reports this garment was difficult for her to don independently , or with extra time and supported positioning. Pt reports she was  able to tolerate adjustable garment without difficulty.    Patient is accompanied by:  Family member    Pertinent History  Chronic leg swelling and associated pain > 20 years; , Hx recurrent  ulcers, hx of lymphorrhea, Contributing factors include CKD, stage 3, HTN, Obesity, OSA (has cpap), OA. Other pertinent medical hx include B neuropathy hands and feet, hx vein ligation and stripping s/p 5-6 yrs, cervicle and lumbar laminectomies, glaucoma and diabetic retinopathy    Limitations  BLE weakness, impaired balance, impaired sensation hands and feet, impaired vision, increased falls risk, decreased hip AROM, claudication    Repetition  Increases Symptoms    Special Tests   +Stemmer base of toes bilaterally    Patient Stated Goals  Want to stop these leg ulcers from returning, Get leg swelling better under control and keep symptoms from getting worse.    Pain Onset  Other (comment)   >20 YEARS                  OT Treatments/Exercises (OP) - 07/27/19 0001      ADLs   ADL Education Given  Yes      Manual Therapy   Manual Therapy  Edema management;Compression Bandaging    Edema Management  skin care to LLE with low ph castor oil during MLD    Manual Lymphatic Drainage (MLD)  MLD to LLE as established    Compression Bandaging  DC CircAid knee high. Trial with B leg babndaging by Pt request. Applied gradient compression wraps from foot to popliteal bilaterally reducing from 3 to 2 short stretch wraps over single layer of Rosidal foam.              OT Education - 07/27/19 1300    Education Details  Pt edu re differences and pros/cons of OTS circular knit compression garments and flat knit custom garments. Discussed assistive devices for donning and doffing. Discussed cost differential. Reviewed precautions for BLE compression wrapping. Pt verbalized understanding and agrees to remove BLE wraps should she experience onset of acute leg pain, signs/ symptioms of infection, or atypical SOB while wearing compression wraps bilaterally or unilaterally.    Person(s) Educated  Patient;Child(ren)    Methods  Explanation;Demonstration;Handout    Comprehension  Verbalized understanding;Returned demonstration;Need further instruction          OT Long Term Goals - 07/17/19 1610      OT LONG TERM GOAL #1   Title  Pt will be able to apply BLE, knee length, multi-layer, short stretch compression wraps daily to one leg at a time using correct gradient techniques with MAXIMUM CAREGIVER ASSISTANCE   to achieve optimal limb volume reduction, to return affected limb/s, as closely as possible, to premorbid size and shape, to limit infection risk, and to improve  safe functional ambulation and mobility.    Baseline  Dependent    Time  4    Period  Days    Status  Achieved      OT LONG TERM GOAL #2   Title  Pt will be able to verbalize signs and symptoms of cellulitis infection and identify at least 4 common lymphedema precautions using printed resource for reference to limit LE progression over time to limit risk of infection and LE exacerbation.    Baseline  Max A    Time  4    Period  Days    Status  Achieved      OT LONG TERM GOAL #3   Title  Pt will tolerate knee length,  multilayer short stretch compression wrap to single leg up to 23/ 7 to achieve optimal swelling reduction to arrest lymphedema progression and in preparation for fitting appropriate compression garments/ devices.    Baseline  Max A    Time  6    Period  Days    Status  Achieved      OT LONG TERM GOAL #4   Title  With max CG support daily for compression wrapping  Pt will achieve and sustain at least  85%  compliance with daily LE self-care home program (skin care, lymphatic pumping therex, compression and simple self-MLD) to reduce and control limb swelling, reduce infection risk and limit LE progression.    Baseline  Dependent    Time  12    Period  Weeks    Status  On-going      OT LONG TERM GOAL #5   Title  Pt will achieve at least 10% limb volume reduction in RLE and 5% reduction in LLE belowthe knees to reduce recurrent leg ulcers, reduce infection risk and limit LE progression.   Good progress. 5.53% reduction measured 07/17/19   Baseline  dependent    Time  12    Period  Weeks    Status  Partially Met            Plan - 07/27/19 1605    Clinical Impression Statement  Circaid adjustable compression wrap loaned last session was not easily accessible to patient. She was unable to access first 2 strratps to set up gradient, and upper straps were not well fitted. Trial ended and we moved on to discuss custom vs OTS circular knit compression garments as option  for daytinme compression. Pt already has a donning butler and tells me she is able to use it . Anatomical measurements reveal Pt will fit into Jobst Varin 3512 size V on the L, but R leg is quite large and this size will not fit that leg. Custom garments are most likelly needed to accomodate both legs. OT to send insurance info to vendor to check benefits for garments. Wrapped both legs below the knee today , but reduced wraps to 2 bandages vs 3 per leg. Pt verbalized understanding of precautions and agrees to remove all compression if SOB and/ or acute pain is noted while wrapped. ont as per POC.    OT Occupational Profile and History  Comprehensive Assessment- Review of records and extensive additional review of physical, cognitive, psychosocial history related to current functional performance    Occupational performance deficits (Please refer to evaluation for details):  ADL's;Leisure;Social Participation;IADL's;Work    Body Structure / Function / Physical Skills  ADL;Decreased knowledge of precautions;Flexibility;ROM;Mobility;Decreased knowledge of use of DME;Balance;Skin integrity;Wound;Edema;Pain;IADL    Rehab Potential  Good    Clinical Decision Making  Several treatment options, min-mod task modification necessary    Comorbidities Affecting Occupational Performance:  Presence of comorbidities impacting occupational performance    Comorbidities impacting occupational performance description:  see SUBJECTIVE for contibuting comorbidities    Modification or Assistance to Complete Evaluation   Max significant modification of tasks or assist is necessary to complete    OT Frequency  2x / week   2-3 x weekly x 12 weeks and prn   OT Duration  12 weeks   and PRN   OT Treatment/Interventions  Self-care/ADL training;Therapeutic exercise;Manual lymph drainage;Compression bandaging;Patient/family education;Other (comment);Therapeutic activities;Manual Therapy;DME and/or AE instruction   SKIN CARE   Plan  CDT: MLD, gradient wraps, ther ex, skin care,    OT Home Exercise Plan  Caregiver to apply compression wraps daily during visit intervals and assist with donning / doffing compression consistently    Recommended Other Services  Consider Velcro-style, adjustable , knee length Jobst, CircAid cmpression wraps as an alternative to traditional elastic stockings for ease of donning    Consulted and Agree with Plan of Care  Patient       Patient will benefit from skilled therapeutic intervention in order to improve the following deficits and impairments:   Body Structure / Function / Physical Skills: ADL, Decreased knowledge of precautions, Flexibility, ROM, Mobility, Decreased knowledge of use of DME, Balance, Skin integrity, Wound, Edema, Pain, IADL       Visit Diagnosis: Lymphedema, not elsewhere classified    Problem List Patient Active Problem List   Diagnosis Date Noted  . GERD (gastroesophageal reflux disease) 01/20/2019  . Hyperlipidemia 01/20/2019  . Claudication ([place]) 01/07/2019  . Venous ulcer of left leg ([place]) 08/13/2018  . Diabetic polyneuropathy associated with type 2 diabetes mellitus ([place]) 05/30/2018  . Macrocytosis 04/26/2018  . Fecal occult blood test positive 10/08/2017  . IDA (iron deficiency anemia) 10/08/2017  . Chronic venous insufficiency 12/25/2016  . Lymphedema 12/25/2016  . Diabetes ([place]) 12/25/2016  . Essential hypertension 12/25/2016  . Hx of adenomatous colonic polyps 10/22/2016  . DOE (dyspnea on exertion) 07/22/2016  . CKD (chronic kidney disease) stage 3, GFR 30-59 ml/min 06/23/2016  . Swelling of limb 12/31/2015  . Severe obesity (BMI >= 40) ([place]) 12/31/2015  . Varicose veins of both legs with edema 12/31/2015  . VV (varicose veins) 11/27/2015  . Other iron deficiency anemia 10/08/2015  . Anemia due to stage 3 chronic kidney disease 10/08/2015  . BMI 45.0-49.9, adult ([place]) 10/05/2015  . OSA on CPAP 03/10/2015  . Lumbar stenosis with neurogenic  claudication 09/30/2011    Andrey Spearman, MS, OTR/L, [facility] 07/27/19 4:11 PM  [place] MAIN [facility] SERVICES [address] [place], [place], [address] Phone: [phone]   Fax:  [phone]  Name: Sara Bright MRN: [id number] Date of Birth: 31-Mar-1940

## 2019-07-28 ENCOUNTER — Encounter: Payer: Medicare HMO | Admitting: Occupational Therapy

## 2019-07-31 ENCOUNTER — Other Ambulatory Visit: Payer: Self-pay

## 2019-07-31 ENCOUNTER — Ambulatory Visit: Payer: Medicare HMO | Admitting: Occupational Therapy

## 2019-07-31 DIAGNOSIS — I890 Lymphedema, not elsewhere classified: Secondary | ICD-10-CM

## 2019-07-31 NOTE — Therapy (Signed)
[place] MAIN [facility] SERVICES [address] [place], [place], [address] Phone: [phone]   Fax:  [phone]  Occupational Therapy Treatment  Patient Details  Name: Sara Bright MRN: [id number] Date of Birth: 12/29/1940 Referring Provider (OT): Eulogio Ditch, [place]   Encounter Date: 07/31/2019  OT End of Session - 07/31/19 1246    Visit Number  14    Number of Visits  36    Date for OT Re-Evaluation  09/03/19    OT Start Time  1105    OT Stop Time  1215    OT Time Calculation (min)  70 min    Activity Tolerance  Patient tolerated treatment well;No increased pain    Behavior During Therapy  WFL for tasks assessed/performed       Past Medical History:  Diagnosis Date  . Adenoma of colon   . Adenomatous colon polyp   . Anemia    IDA  . Cataract   . Cervical stenosis of spinal canal   . Chronic kidney disease    stage 3  . Diabetes mellitus without complication ([place])   . GERD (gastroesophageal reflux disease)   . Glaucoma   . Heme positive stool   . History of UTI   . Hx of gallstones   . Hyperkalemia   . Hyperlipidemia   . Hyperlipidemia   . Hypertension   . Lumbar stenosis   . Lymphedema   . Neuromuscular disorder ([place])   . Neuropathy associated with endocrine disorder ([place])   . Obesity   . OSA on CPAP    on CPAP  . Osteoarthritis   . Osteoarthritis   . Port-A-Cath in place   . Retinopathy due to secondary diabetes ([place])   . Secondary hyperparathyroidism of renal origin [facility])     Past Surgical History:  Procedure Laterality Date  . ACDF X2    . BACK SURGERY    . Cataract extraction right    . catarect extraction left    . CHOLECYSTECTOMY    . COLONOSCOPY WITH PROPOFOL N/A 12/02/2016   Procedure: COLONOSCOPY WITH PROPOFOL;  Surgeon: Manya Silvas, MD;  Location: [facility] ENDOSCOPY;  Service: Endoscopy;  Laterality: N/A;  . COLONOSCOPY WITH PROPOFOL N/A 11/10/2017   Procedure: COLONOSCOPY WITH PROPOFOL;  Surgeon:  Toledo, Benay Pike, MD;  Location: [facility] ENDOSCOPY;  Service: Gastroenterology;  Laterality: N/A;  . colonoscopy with removal lesions by snare    . Endoscoic carpal tunnel release    . ESOPHAGOGASTRODUODENOSCOPY N/A 11/10/2017   Procedure: ESOPHAGOGASTRODUODENOSCOPY (EGD);  Surgeon: Toledo, Benay Pike, MD;  Location: [facility] ENDOSCOPY;  Service: Gastroenterology;  Laterality: N/A;  . ESOPHAGOGASTRODUODENOSCOPY (EGD) WITH PROPOFOL N/A 12/02/2016   Procedure: ESOPHAGOGASTRODUODENOSCOPY (EGD) WITH PROPOFOL;  Surgeon: Manya Silvas, MD;  Location: [facility] ENDOSCOPY;  Service: Endoscopy;  Laterality: N/A;  . IR IMAGING GUIDED PORT INSERTION  10/01/2017  . Laminectomy posterior lumbar facetectomy and formaninotomy w/Decomp    . Laminectony posterior cervicle decomp w/Facectomy and foraminotomy    . VEIN LIGATION AND STRIPPING Left     There were no vitals filed for this visit.  Subjective Assessment - 07/31/19 1238    Subjective   Mrs Runnion presents for OT visit 14/36 to address BLE lymphedema. Pt is unaccompanied. Pt denies leg pain this morning. She presents with BLE, knee length, gradient compression wraps in place. Pt states she had no difficulty whatsoever since last visit tolerating wraps on both legs. She denies trips or stumbles when walking, difficulty  w transfers, no problems sleeping, no pain and no atypical SOB.    Patient is accompanied by:  Family member    Pertinent History  Chronic leg swelling and associated pain > 20 years; , Hx recurrent  ulcers, hx of lymphorrhea, Contributing factors include CKD, stage 3, HTN, Obesity, OSA (has cpap), OA. Other pertinent medical hx include B neuropathy hands and feet, hx vein ligation and stripping s/p 5-6 yrs, cervicle and lumbar laminectomies, glaucoma and diabetic retinopathy    Limitations  BLE weakness, impaired balance, impaired sensation hands and feet, impaired vision, increased falls risk, decreased hip AROM, claudication    Repetition  Increases  Symptoms    Special Tests  +Stemmer base of toes bilaterally    Patient Stated Goals  Want to stop these leg ulcers from returning, Get leg swelling better under control and keep symptoms from getting worse.    Pain Onset  Other (comment)   >20 YEARS                  OT Treatments/Exercises (OP) - 07/31/19 0001      ADLs   ADL Education Given  Yes      Manual Therapy   Manual Therapy  Edema management    Manual therapy comments  fibrosis techniques to increased tissue density at ankle and distal leg, dorsal foot and toes    Edema Management  skin care to RLE with low ph castor oil during MLD    Manual Lymphatic Drainage (MLD)  MLD to RLE as established    Compression Bandaging  Applied gradient compression wraps from foot to popliteal bilaterally reducing from 3 to 2 short stretch wraps over single layer of Rosidal foam.              OT Education - 07/31/19 1244    Education Details  Pt educated re alternative funding resources for custom compression stockings, including [employer].Pt educated about application process with DME vendor creating invoice once we submit measurements.    Person(s) Educated  Patient;Child(ren)    Methods  Explanation;Demonstration;Handout    Comprehension  Verbalized understanding;Returned demonstration;Need further instruction          OT Long Term Goals - 07/17/19 1610      OT LONG TERM GOAL #1   Title  Pt will be able to apply BLE, knee length, multi-layer, short stretch compression wraps daily to one leg at a time using correct gradient techniques with MAXIMUM CAREGIVER ASSISTANCE   to achieve optimal limb volume reduction, to return affected limb/s, as closely as possible, to premorbid size and shape, to limit infection risk, and to improve safe functional ambulation and mobility.    Baseline  Dependent    Time  4    Period  Days    Status  Achieved      OT LONG TERM GOAL #2   Title  Pt will be able to  verbalize signs and symptoms of cellulitis infection and identify at least 4 common lymphedema precautions using printed resource for reference to limit LE progression over time to limit risk of infection and LE exacerbation.    Baseline  Max A    Time  4    Period  Days    Status  Achieved      OT LONG TERM GOAL #3   Title  Pt will tolerate knee length,  multilayer short stretch compression wrap to single leg up to 23/ 7 to achieve optimal swelling reduction  to arrest lymphedema progression and in preparation for fitting appropriate compression garments/ devices.    Baseline  Max A    Time  6    Period  Days    Status  Achieved      OT LONG TERM GOAL #4   Title  With max CG support daily for compression wrapping  Pt will achieve and sustain at least  85%  compliance with daily LE self-care home program (skin care, lymphatic pumping therex, compression and simple self-MLD) to reduce and control limb swelling, reduce infection risk and limit LE progression.    Baseline  Dependent    Time  12    Period  Weeks    Status  On-going      OT LONG TERM GOAL #5   Title  Pt will achieve at least 10% limb volume reduction in RLE and 5% reduction in LLE belowthe knees to reduce recurrent leg ulcers, reduce infection risk and limit LE progression.   Good progress. 5.53% reduction measured 07/17/19   Baseline  dependent    Time  12    Period  Weeks    Status  Partially Met            Plan - 07/31/19 1247    Clinical Impression Statement  BLE limb volumes are visibly decreased this morning. Skin is well hydrated and mobile with mild fatty fibrosis at B malleoli, dorsal feet and toes, R>L. Family members continue to diligently and consistetly support CDT and assist with compression wraps between clinical visits.Pt not yet ready for compression garment measurements. Vendor notified us via email that Pt's medical insurance provides 0 conerage for custom  compression garments, so Pt educated about  charitable foundation and application process. Cont as per POC.    OT Occupational Profile and History  Comprehensive Assessment- Review of records and extensive additional review of physical, cognitive, psychosocial history related to current functional performance    Occupational performance deficits (Please refer to evaluation for details):  ADL's;Leisure;Social Participation;IADL's;Work    Body Structure / Function / Physical Skills  ADL;Decreased knowledge of precautions;Flexibility;ROM;Mobility;Decreased knowledge of use of DME;Balance;Skin integrity;Wound;Edema;Pain;IADL    Rehab Potential  Good    Clinical Decision Making  Several treatment options, min-mod task modification necessary    Comorbidities Affecting Occupational Performance:  Presence of comorbidities impacting occupational performance    Comorbidities impacting occupational performance description:  see SUBJECTIVE for contibuting comorbidities    Modification or Assistance to Complete Evaluation   Max significant modification of tasks or assist is necessary to complete    OT Frequency  2x / week   2-3 x weekly x 12 weeks and prn   OT Duration  12 weeks   and PRN   OT Treatment/Interventions  Self-care/ADL training;Therapeutic exercise;Manual lymph drainage;Compression bandaging;Patient/family education;Other (comment);Therapeutic activities;Manual Therapy;DME and/or AE instruction   SKIN CARE   Plan  CDT: MLD, gradient wraps, ther ex, skin care,    OT Home Exercise Plan  Caregiver to apply compression wraps daily during visit intervals and assist with donning / doffing compression consistently    Recommended Other Services  Consider Velcro-style, adjustable , knee length Jobst, CircAid cmpression wraps as an alternative to traditional elastic stockings for ease of donning    Consulted and Agree with Plan of Care  Patient       Patient will benefit from skilled therapeutic intervention in order to improve the following  deficits and impairments:   Body Structure / Function / Physical Skills: ADL,  Decreased knowledge of precautions, Flexibility, ROM, Mobility, Decreased knowledge of use of DME, Balance, Skin integrity, Wound, Edema, Pain, IADL       Visit Diagnosis: Lymphedema, not elsewhere classified    Problem List Patient Active Problem List   Diagnosis Date Noted  . GERD (gastroesophageal reflux disease) 01/20/2019  . Hyperlipidemia 01/20/2019  . Claudication ([place]) 01/07/2019  . Venous ulcer of left leg ([place]) 08/13/2018  . Diabetic polyneuropathy associated with type 2 diabetes mellitus ([place]) 05/30/2018  . Macrocytosis 04/26/2018  . Fecal occult blood test positive 10/08/2017  . IDA (iron deficiency anemia) 10/08/2017  . Chronic venous insufficiency 12/25/2016  . Lymphedema 12/25/2016  . Diabetes ([place]) 12/25/2016  . Essential hypertension 12/25/2016  . Hx of adenomatous colonic polyps 10/22/2016  . DOE (dyspnea on exertion) 07/22/2016  . CKD (chronic kidney disease) stage 3, GFR 30-59 ml/min 06/23/2016  . Swelling of limb 12/31/2015  . Severe obesity (BMI >= 40) ([place]) 12/31/2015  . Varicose veins of both legs with edema 12/31/2015  . VV (varicose veins) 11/27/2015  . Other iron deficiency anemia 10/08/2015  . Anemia due to stage 3 chronic kidney disease 10/08/2015  . BMI 45.0-49.9, adult ([place]) 10/05/2015  . OSA on CPAP 03/10/2015  . Lumbar stenosis with neurogenic claudication 09/30/2011    Andrey Spearman, MS, OTR/L, [facility] 07/31/19 12:53 PM  [place] MAIN [facility] SERVICES [address] [place], [place], [address] Phone: [phone]   Fax:  [phone]  Name: Sara Bright MRN: [id number] Date of Birth: [birth year]

## 2019-08-01 ENCOUNTER — Other Ambulatory Visit: Payer: Self-pay

## 2019-08-01 ENCOUNTER — Ambulatory Visit: Payer: Medicare HMO | Admitting: Occupational Therapy

## 2019-08-01 ENCOUNTER — Inpatient Hospital Stay: Payer: Medicare HMO

## 2019-08-01 VITALS — BP 137/66 | HR 67 | Temp 97.6°F | Resp 20

## 2019-08-01 DIAGNOSIS — N183 Chronic kidney disease, stage 3 unspecified: Secondary | ICD-10-CM

## 2019-08-01 DIAGNOSIS — D631 Anemia in chronic kidney disease: Secondary | ICD-10-CM

## 2019-08-01 DIAGNOSIS — I129 Hypertensive chronic kidney disease with stage 1 through stage 4 chronic kidney disease, or unspecified chronic kidney disease: Secondary | ICD-10-CM | POA: Diagnosis not present

## 2019-08-01 DIAGNOSIS — N1832 Chronic kidney disease, stage 3b: Secondary | ICD-10-CM

## 2019-08-01 LAB — HEMOGLOBIN AND HEMATOCRIT, BLOOD
HCT: 31.4 % — ABNORMAL LOW (ref 36.0–46.0)
Hemoglobin: 9.8 g/dL — ABNORMAL LOW (ref 12.0–15.0)

## 2019-08-01 MED ORDER — EPOETIN ALFA-EPBX 10000 UNIT/ML IJ SOLN
10000.0000 [IU] | Freq: Once | INTRAMUSCULAR | Status: AC
  Administered 2019-08-01: 10000 [IU] via SUBCUTANEOUS

## 2019-08-03 ENCOUNTER — Ambulatory Visit: Payer: Medicare HMO | Admitting: Occupational Therapy

## 2019-08-08 ENCOUNTER — Other Ambulatory Visit: Payer: Self-pay

## 2019-08-08 ENCOUNTER — Ambulatory Visit: Payer: Medicare HMO | Attending: Nurse Practitioner | Admitting: Occupational Therapy

## 2019-08-08 DIAGNOSIS — I890 Lymphedema, not elsewhere classified: Secondary | ICD-10-CM

## 2019-08-08 NOTE — Therapy (Signed)
[place] MAIN [facility] SERVICES [address] [place], [place], [address] Phone: [phone]   Fax:  [phone]  Occupational Therapy Treatment  Patient Details  Name: Sara Bright MRN: [id number] Date of Birth: 1941-01-19 Referring Provider (OT): Eulogio Ditch, [place]   Encounter Date: 08/08/2019  OT End of Session - 08/08/19 1257    Visit Number  15    Number of Visits  36    Date for OT Re-Evaluation  09/03/19    OT Start Time  0945    OT Stop Time  1115    OT Time Calculation (min)  90 min    Activity Tolerance  Patient tolerated treatment well;No increased pain    Behavior During Therapy  WFL for tasks assessed/performed       Past Medical History:  Diagnosis Date  . Adenoma of colon   . Adenomatous colon polyp   . Anemia    IDA  . Cataract   . Cervical stenosis of spinal canal   . Chronic kidney disease    stage 3  . Diabetes mellitus without complication ([place])   . GERD (gastroesophageal reflux disease)   . Glaucoma   . Heme positive stool   . History of UTI   . Hx of gallstones   . Hyperkalemia   . Hyperlipidemia   . Hyperlipidemia   . Hypertension   . Lumbar stenosis   . Lymphedema   . Neuromuscular disorder ([place])   . Neuropathy associated with endocrine disorder ([place])   . Obesity   . OSA on CPAP    on CPAP  . Osteoarthritis   . Osteoarthritis   . Port-A-Cath in place   . Retinopathy due to secondary diabetes ([place])   . Secondary hyperparathyroidism of renal origin [facility])     Past Surgical History:  Procedure Laterality Date  . ACDF X2    . BACK SURGERY    . Cataract extraction right    . catarect extraction left    . CHOLECYSTECTOMY    . COLONOSCOPY WITH PROPOFOL N/A 12/02/2016   Procedure: COLONOSCOPY WITH PROPOFOL;  Surgeon: Manya Silvas, MD;  Location: [facility] ENDOSCOPY;  Service: Endoscopy;  Laterality: N/A;  . COLONOSCOPY WITH PROPOFOL N/A 11/10/2017   Procedure: COLONOSCOPY WITH PROPOFOL;  Surgeon:  Toledo, Benay Pike, MD;  Location: [facility] ENDOSCOPY;  Service: Gastroenterology;  Laterality: N/A;  . colonoscopy with removal lesions by snare    . Endoscoic carpal tunnel release    . ESOPHAGOGASTRODUODENOSCOPY N/A 11/10/2017   Procedure: ESOPHAGOGASTRODUODENOSCOPY (EGD);  Surgeon: Toledo, Benay Pike, MD;  Location: [facility] ENDOSCOPY;  Service: Gastroenterology;  Laterality: N/A;  . ESOPHAGOGASTRODUODENOSCOPY (EGD) WITH PROPOFOL N/A 12/02/2016   Procedure: ESOPHAGOGASTRODUODENOSCOPY (EGD) WITH PROPOFOL;  Surgeon: Manya Silvas, MD;  Location: [facility] ENDOSCOPY;  Service: Endoscopy;  Laterality: N/A;  . IR IMAGING GUIDED PORT INSERTION  10/01/2017  . Laminectomy posterior lumbar facetectomy and formaninotomy w/Decomp    . Laminectony posterior cervicle decomp w/Facectomy and foraminotomy    . VEIN LIGATION AND STRIPPING Left     There were no vitals filed for this visit.  Subjective Assessment - 08/08/19 1251    Subjective   Mrs Strohmeier presents for OT visit 15/36 to address BLE lymphedema. Pt is unaccompanied. Pt missed last scheduled visit due to a fall at home, reportedly tripping over a chair. "I dont have enough strength in my arms and legs to pull myself up. I dont have enoiugh strength to roll over onto my  knees."    Patient is accompanied by:  Family member    Pertinent History  Chronic leg swelling and associated pain > 20 years; , Hx recurrent  ulcers, hx of lymphorrhea, Contributing factors include CKD, stage 3, HTN, Obesity, OSA (has cpap), OA. Other pertinent medical hx include B neuropathy hands and feet, hx vein ligation and stripping s/p 5-6 yrs, cervicle and lumbar laminectomies, glaucoma and diabetic retinopathy    Limitations  BLE weakness, impaired balance, impaired sensation hands and feet, impaired vision, increased falls risk, decreased hip AROM, claudication    Repetition  Increases Symptoms    Special Tests  +Stemmer base of toes bilaterally    Patient Stated Goals  Want to stop  these leg ulcers from returning, Get leg swelling better under control and keep symptoms from getting worse.    Pain Onset  Other (comment)   >20 YEARS                  OT Treatments/Exercises (OP) - 08/08/19 0001      ADLs   ADL Education Given  Yes      Neurological Re-education Exercises   Other Exercises 1  Seated resistive band ther ex for upper and lower extremity strengthening and to facilitate improved calf muscle pump action essesntial for lymphatic function      Manual Therapy   Manual Therapy  Edema management;Manual Lymphatic Drainage (MLD);Compression Bandaging    Manual Lymphatic Drainage (MLD)  MLD to RLE as established    Compression Bandaging  Applied gradient compression wraps from foot to popliteal bilaterally reducing from 3 to 2 short stretch wraps over single layer of Rosidal foam.              OT Education - 08/08/19 1257    Education Details  Pt educated re ther ex.    Person(s) Educated  Patient;Child(ren)    Methods  Explanation;Demonstration;Handout    Comprehension  Verbalized understanding;Returned demonstration;Need further instruction          OT Long Term Goals - 07/17/19 1610      OT LONG TERM GOAL #1   Title  Pt will be able to apply BLE, knee length, multi-layer, short stretch compression wraps daily to one leg at a time using correct gradient techniques with MAXIMUM CAREGIVER ASSISTANCE   to achieve optimal limb volume reduction, to return affected limb/s, as closely as possible, to premorbid size and shape, to limit infection risk, and to improve safe functional ambulation and mobility.    Baseline  Dependent    Time  4    Period  Days    Status  Achieved      OT LONG TERM GOAL #2   Title  Pt will be able to verbalize signs and symptoms of cellulitis infection and identify at least 4 common lymphedema precautions using printed resource for reference to limit LE progression over time to limit risk of infection and LE  exacerbation.    Baseline  Max A    Time  4    Period  Days    Status  Achieved      OT LONG TERM GOAL #3   Title  Pt will tolerate knee length,  multilayer short stretch compression wrap to single leg up to 23/ 7 to achieve optimal swelling reduction to arrest lymphedema progression and in preparation for fitting appropriate compression garments/ devices.    Baseline  Max A    Time  6    Period  Days    Status  Achieved      OT LONG TERM GOAL #4   Title  With max CG support daily for compression wrapping  Pt will achieve and sustain at least  85%  compliance with daily LE self-care home program (skin care, lymphatic pumping therex, compression and simple self-MLD) to reduce and control limb swelling, reduce infection risk and limit LE progression.    Baseline  Dependent    Time  12    Period  Weeks    Status  On-going      OT LONG TERM GOAL #5   Title  Pt will achieve at least 10% limb volume reduction in RLE and 5% reduction in LLE belowthe knees to reduce recurrent leg ulcers, reduce infection risk and limit LE progression.   Good progress. 5.53% reduction measured 07/17/19   Baseline  dependent    Time  12    Period  Weeks    Status  Partially Met            Plan - 08/08/19 1258    Clinical Impression Statement  Emphasis of OT Rx this session on teaching and performing  BLE/BUE strenthening ther ex to reduce falls risk, improve safe transfers and facilitate increased calf muscle pup essential for lymphatic drainage of the leg below the knee. Provided demonstration, handout and guided Pt thru all with good return. Provided printed ther ex log and instructed Pt to perform 1 set of 5 reps of each ther ex  bilaterally at least once daily until next visit next week. Cont as per POC.    OT Occupational Profile and History  Comprehensive Assessment- Review of records and extensive additional review of physical, cognitive, psychosocial history related to current functional performance     Occupational performance deficits (Please refer to evaluation for details):  ADL's;Leisure;Social Participation;IADL's;Work    Body Structure / Function / Physical Skills  ADL;Decreased knowledge of precautions;Flexibility;ROM;Mobility;Decreased knowledge of use of DME;Balance;Skin integrity;Wound;Edema;Pain;IADL    Rehab Potential  Good    Clinical Decision Making  Several treatment options, min-mod task modification necessary    Comorbidities Affecting Occupational Performance:  Presence of comorbidities impacting occupational performance    Comorbidities impacting occupational performance description:  see SUBJECTIVE for contibuting comorbidities    Modification or Assistance to Complete Evaluation   Max significant modification of tasks or assist is necessary to complete    OT Frequency  2x / week   2-3 x weekly x 12 weeks and prn   OT Duration  12 weeks   and PRN   OT Treatment/Interventions  Self-care/ADL training;Therapeutic exercise;Manual lymph drainage;Compression bandaging;Patient/family education;Other (comment);Therapeutic activities;Manual Therapy;DME and/or AE instruction   SKIN CARE   Plan  CDT: MLD, gradient wraps, ther ex, skin care,    OT Home Exercise Plan  Caregiver to apply compression wraps daily during visit intervals and assist with donning / doffing compression consistently    Recommended Other Services  Consider Velcro-style, adjustable , knee length Jobst, CircAid cmpression wraps as an alternative to traditional elastic stockings for ease of donning    Consulted and Agree with Plan of Care  Patient       Patient will benefit from skilled therapeutic intervention in order to improve the following deficits and impairments:   Body Structure / Function / Physical Skills: ADL, Decreased knowledge of precautions, Flexibility, ROM, Mobility, Decreased knowledge of use of DME, Balance, Skin integrity, Wound, Edema, Pain, IADL       Visit Diagnosis: Lymphedema,  not  elsewhere classified    Problem List Patient Active Problem List   Diagnosis Date Noted  . GERD (gastroesophageal reflux disease) 01/20/2019  . Hyperlipidemia 01/20/2019  . Claudication ([place]) 01/07/2019  . Venous ulcer of left leg ([place]) 08/13/2018  . Diabetic polyneuropathy associated with type 2 diabetes mellitus ([place]) 05/30/2018  . Macrocytosis 04/26/2018  . Fecal occult blood test positive 10/08/2017  . IDA (iron deficiency anemia) 10/08/2017  . Chronic venous insufficiency 12/25/2016  . Lymphedema 12/25/2016  . Diabetes ([place]) 12/25/2016  . Essential hypertension 12/25/2016  . Hx of adenomatous colonic polyps 10/22/2016  . DOE (dyspnea on exertion) 07/22/2016  . CKD (chronic kidney disease) stage 3, GFR 30-59 ml/min 06/23/2016  . Swelling of limb 12/31/2015  . Severe obesity (BMI >= 40) ([place]) 12/31/2015  . Varicose veins of both legs with edema 12/31/2015  . VV (varicose veins) 11/27/2015  . Other iron deficiency anemia 10/08/2015  . Anemia due to stage 3 chronic kidney disease 10/08/2015  . BMI 45.0-49.9, adult ([place]) 10/05/2015  . OSA on CPAP 03/10/2015  . Lumbar stenosis with neurogenic claudication 09/30/2011    Andrey Spearman, MS, OTR/L, [facility] 08/08/19 1:03 PM  [place] MAIN [facility] SERVICES [address] [place], [place], [address] Phone: [phone]   Fax:  [phone]  Name: BECKETT HICKMON MRN: [id number] Date of Birth: 02-24-1941

## 2019-08-10 ENCOUNTER — Ambulatory Visit: Payer: Medicare HMO | Admitting: Occupational Therapy

## 2019-08-14 ENCOUNTER — Ambulatory Visit: Payer: Medicare HMO | Admitting: Occupational Therapy

## 2019-08-15 ENCOUNTER — Inpatient Hospital Stay: Payer: Medicare HMO | Attending: Hematology and Oncology

## 2019-08-15 ENCOUNTER — Other Ambulatory Visit: Payer: Self-pay

## 2019-08-15 ENCOUNTER — Inpatient Hospital Stay: Payer: Medicare HMO

## 2019-08-15 ENCOUNTER — Ambulatory Visit: Payer: Medicare HMO | Admitting: Occupational Therapy

## 2019-08-15 VITALS — BP 134/58 | HR 62 | Temp 98.4°F | Resp 19

## 2019-08-15 DIAGNOSIS — N1832 Chronic kidney disease, stage 3b: Secondary | ICD-10-CM

## 2019-08-15 DIAGNOSIS — Z79899 Other long term (current) drug therapy: Secondary | ICD-10-CM | POA: Insufficient documentation

## 2019-08-15 DIAGNOSIS — I129 Hypertensive chronic kidney disease with stage 1 through stage 4 chronic kidney disease, or unspecified chronic kidney disease: Secondary | ICD-10-CM | POA: Diagnosis not present

## 2019-08-15 DIAGNOSIS — Z823 Family history of stroke: Secondary | ICD-10-CM | POA: Insufficient documentation

## 2019-08-15 DIAGNOSIS — Z833 Family history of diabetes mellitus: Secondary | ICD-10-CM | POA: Diagnosis not present

## 2019-08-15 DIAGNOSIS — Z95828 Presence of other vascular implants and grafts: Secondary | ICD-10-CM

## 2019-08-15 DIAGNOSIS — Z8051 Family history of malignant neoplasm of kidney: Secondary | ICD-10-CM | POA: Diagnosis not present

## 2019-08-15 DIAGNOSIS — D631 Anemia in chronic kidney disease: Secondary | ICD-10-CM

## 2019-08-15 DIAGNOSIS — Z811 Family history of alcohol abuse and dependence: Secondary | ICD-10-CM | POA: Diagnosis not present

## 2019-08-15 DIAGNOSIS — Z836 Family history of other diseases of the respiratory system: Secondary | ICD-10-CM | POA: Diagnosis not present

## 2019-08-15 DIAGNOSIS — Z841 Family history of disorders of kidney and ureter: Secondary | ICD-10-CM | POA: Insufficient documentation

## 2019-08-15 DIAGNOSIS — N183 Chronic kidney disease, stage 3 unspecified: Secondary | ICD-10-CM | POA: Insufficient documentation

## 2019-08-15 DIAGNOSIS — Z8249 Family history of ischemic heart disease and other diseases of the circulatory system: Secondary | ICD-10-CM | POA: Diagnosis not present

## 2019-08-15 DIAGNOSIS — D7589 Other specified diseases of blood and blood-forming organs: Secondary | ICD-10-CM | POA: Diagnosis not present

## 2019-08-15 LAB — HEMOGLOBIN AND HEMATOCRIT, BLOOD
HCT: 29.1 % — ABNORMAL LOW (ref 36.0–46.0)
Hemoglobin: 9.1 g/dL — ABNORMAL LOW (ref 12.0–15.0)

## 2019-08-15 MED ORDER — SODIUM CHLORIDE 0.9% FLUSH
10.0000 mL | INTRAVENOUS | Status: DC | PRN
  Administered 2019-08-15: 10 mL via INTRAVENOUS
  Filled 2019-08-15: qty 10

## 2019-08-15 MED ORDER — EPOETIN ALFA-EPBX 10000 UNIT/ML IJ SOLN
10000.0000 [IU] | Freq: Once | INTRAMUSCULAR | Status: AC
  Administered 2019-08-15: 10000 [IU] via SUBCUTANEOUS

## 2019-08-15 MED ORDER — HEPARIN SOD (PORK) LOCK FLUSH 100 UNIT/ML IV SOLN
500.0000 [IU] | Freq: Once | INTRAVENOUS | Status: AC
  Administered 2019-08-15: 500 [IU] via INTRAVENOUS
  Filled 2019-08-15: qty 5

## 2019-08-17 ENCOUNTER — Ambulatory Visit: Payer: Medicare HMO | Admitting: Occupational Therapy

## 2019-08-17 ENCOUNTER — Other Ambulatory Visit: Payer: Self-pay

## 2019-08-17 DIAGNOSIS — I890 Lymphedema, not elsewhere classified: Secondary | ICD-10-CM

## 2019-08-17 NOTE — Therapy (Signed)
[place] MAIN [facility] SERVICES [address] [place], [place], [address] Phone: [phone]   Fax:  [phone]  Occupational Therapy Treatment  Patient Details  Name: ARRINGTON BENCOMO MRN: [id number] Date of Birth: [birth year] Referring Provider (OT): Eulogio Ditch, [place]   Encounter Date: 08/17/2019   OT End of Session - 08/17/19 1021    Visit Number 16    Number of Visits 36    Date for OT Re-Evaluation 09/03/19    OT Start Time 1015    OT Stop Time 1113    OT Time Calculation (min) 58 min    Activity Tolerance Patient tolerated treatment well;No increased pain    Behavior During Therapy WFL for tasks assessed/performed           Past Medical History:  Diagnosis Date  . Adenoma of colon   . Adenomatous colon polyp   . Anemia    IDA  . Cataract   . Cervical stenosis of spinal canal   . Chronic kidney disease    stage 3  . Diabetes mellitus without complication ([place])   . GERD (gastroesophageal reflux disease)   . Glaucoma   . Heme positive stool   . History of UTI   . Hx of gallstones   . Hyperkalemia   . Hyperlipidemia   . Hyperlipidemia   . Hypertension   . Lumbar stenosis   . Lymphedema   . Neuromuscular disorder ([place])   . Neuropathy associated with endocrine disorder ([place])   . Obesity   . OSA on CPAP    on CPAP  . Osteoarthritis   . Osteoarthritis   . Port-A-Cath in place   . Retinopathy due to secondary diabetes ([place])   . Secondary hyperparathyroidism of renal origin [facility])     Past Surgical History:  Procedure Laterality Date  . ACDF X2    . BACK SURGERY    . Cataract extraction right    . catarect extraction left    . CHOLECYSTECTOMY    . COLONOSCOPY WITH PROPOFOL N/A 12/02/2016   Procedure: COLONOSCOPY WITH PROPOFOL;  Surgeon: Manya Silvas, MD;  Location: [facility] ENDOSCOPY;  Service: Endoscopy;  Laterality: N/A;  . COLONOSCOPY WITH PROPOFOL N/A 11/10/2017   Procedure: COLONOSCOPY WITH PROPOFOL;  Surgeon:  Toledo, Benay Pike, MD;  Location: [facility] ENDOSCOPY;  Service: Gastroenterology;  Laterality: N/A;  . colonoscopy with removal lesions by snare    . Endoscoic carpal tunnel release    . ESOPHAGOGASTRODUODENOSCOPY N/A 11/10/2017   Procedure: ESOPHAGOGASTRODUODENOSCOPY (EGD);  Surgeon: Toledo, Benay Pike, MD;  Location: [facility] ENDOSCOPY;  Service: Gastroenterology;  Laterality: N/A;  . ESOPHAGOGASTRODUODENOSCOPY (EGD) WITH PROPOFOL N/A 12/02/2016   Procedure: ESOPHAGOGASTRODUODENOSCOPY (EGD) WITH PROPOFOL;  Surgeon: Manya Silvas, MD;  Location: [facility] ENDOSCOPY;  Service: Endoscopy;  Laterality: N/A;  . IR IMAGING GUIDED PORT INSERTION  10/01/2017  . Laminectomy posterior lumbar facetectomy and formaninotomy w/Decomp    . Laminectony posterior cervicle decomp w/Facectomy and foraminotomy    . VEIN LIGATION AND STRIPPING Left     There were no vitals filed for this visit.   Subjective Assessment - 08/17/19 1320    Subjective  Mrs Moxey presents for OT visit 16/36 to address BLE lymphedema. Pt reports her son has learned to assist w compression wraps between visits. Pt reports she has been performing strengthening ther ex perscribed last session         daily and is stiff   afterwards.    Patient is accompanied  by: Family member    Pertinent History Chronic leg swelling and associated pain > 20 years; , Hx recurrent  ulcers, hx of lymphorrhea, Contributing factors include CKD, stage 3, HTN, Obesity, OSA (has cpap), OA. Other pertinent medical hx include B neuropathy hands and feet, hx vein ligation and stripping s/p 5-6 yrs, cervicle and lumbar laminectomies, glaucoma and diabetic retinopathy    Limitations BLE weakness, impaired balance, impaired sensation hands and feet, impaired vision, increased falls risk, decreased hip AROM, claudication    Repetition Increases Symptoms    Special Tests +Stemmer base of toes bilaterally    Patient Stated Goals Want to stop these leg ulcers from returning, Get leg  swelling better under control and keep symptoms from getting worse.    Pain Onset Other (comment)   >20 YEARS                       OT Treatments/Exercises (OP) - 08/17/19 0001      ADLs   ADL Education Given Yes      Manual Therapy   Manual Therapy Edema management;Manual Lymphatic Drainage (MLD);Compression Bandaging    Manual Lymphatic Drainage (MLD) MLD to RLE as established    Compression Bandaging Applied gradient compression wraps from foot to popliteal bilaterally reducing from 3 to 2 short stretch wraps over single layer of Rosidal foam.                   OT Education - 08/17/19 1322    Education Details Continued skilled Pt/caregiver education  And LE ADL training throughout visit for lymphedema self care/ home program, including compression wrapping, compression garment and device wear/care, lymphatic pumping ther ex, simple self-MLD, and skin care. Discussed progress towards goals.    Person(s) Educated Patient;Child(ren)    Methods Explanation;Demonstration;Handout    Comprehension Verbalized understanding;Returned demonstration;Need further instruction               OT Long Term Goals - 07/17/19 1610      OT LONG TERM GOAL #1   Title Pt will be able to apply BLE, knee length, multi-layer, short stretch compression wraps daily to one leg at a time using correct gradient techniques with MAXIMUM CAREGIVER ASSISTANCE   to achieve optimal limb volume reduction, to return affected limb/s, as closely as possible, to premorbid size and shape, to limit infection risk, and to improve safe functional ambulation and mobility.    Baseline Dependent    Time 4    Period Days    Status Achieved      OT LONG TERM GOAL #2   Title Pt will be able to verbalize signs and symptoms of cellulitis infection and identify at least 4 common lymphedema precautions using printed resource for reference to limit LE progression over time to limit risk of infection and LE  exacerbation.    Baseline Max A    Time 4    Period Days    Status Achieved      OT LONG TERM GOAL #3   Title Pt will tolerate knee length,  multilayer short stretch compression wrap to single leg up to 23/ 7 to achieve optimal swelling reduction to arrest lymphedema progression and in preparation for fitting appropriate compression garments/ devices.    Baseline Max A    Time 6    Period Days    Status Achieved      OT LONG TERM GOAL #4   Title With max CG support daily  for compression wrapping  Pt will achieve and sustain at least  85%  compliance with daily LE self-care home program (skin care, lymphatic pumping therex, compression and simple self-MLD) to reduce and control limb swelling, reduce infection risk and limit LE progression.    Baseline Dependent    Time 12    Period Weeks    Status On-going      OT LONG TERM GOAL #5   Title Pt will achieve at least 10% limb volume reduction in RLE and 5% reduction in LLE belowthe knees to reduce recurrent leg ulcers, reduce infection risk and limit LE progression.   Good progress. 5.53% reduction measured 07/17/19   Baseline dependent    Time 12    Period Weeks    Status Partially Met                 Plan - 08/17/19 1322    Clinical Impression Statement Pt tolerated MLD and compression wrapping to RLE without difficulty today. Limb volumes bilaterally are very well managed between visits, and it appears Pt may be approaching plateau in volume reductions bilaterally. Skin is in excellent condition  with excellent hydration and improving mobility. Pt approaching readiness for compression garment fitting. Will finalize plan for garment recommendation and complete measurments next session.    OT Occupational Profile and History Comprehensive Assessment- Review of records and extensive additional review of physical, cognitive, psychosocial history related to current functional performance    Occupational performance deficits (Please  refer to evaluation for details): ADL's;Leisure;Social Participation;IADL's;Work    Body Structure / Function / Physical Skills ADL;Decreased knowledge of precautions;Flexibility;ROM;Mobility;Decreased knowledge of use of DME;Balance;Skin integrity;Wound;Edema;Pain;IADL    Rehab Potential Good    Clinical Decision Making Several treatment options, min-mod task modification necessary    Comorbidities Affecting Occupational Performance: Presence of comorbidities impacting occupational performance    Comorbidities impacting occupational performance description: see SUBJECTIVE for contibuting comorbidities    Modification or Assistance to Complete Evaluation  Max significant modification of tasks or assist is necessary to complete    OT Frequency 2x / week   2-3 x weekly x 12 weeks and prn   OT Duration 12 weeks   and PRN   OT Treatment/Interventions Self-care/ADL training;Therapeutic exercise;Manual lymph drainage;Compression bandaging;Patient/family education;Other (comment);Therapeutic activities;Manual Therapy;DME and/or AE instruction   SKIN CARE   Plan CDT: MLD, gradient wraps, ther ex, skin care,    OT Home Exercise Plan Caregiver to apply compression wraps daily during visit intervals and assist with donning / doffing compression consistently    Recommended Other Services Consider Velcro-style, adjustable , knee length Jobst, CircAid cmpression wraps as an alternative to traditional elastic stockings for ease of donning    Consulted and Agree with Plan of Care Patient           Patient will benefit from skilled therapeutic intervention in order to improve the following deficits and impairments:   Body Structure / Function / Physical Skills: ADL, Decreased knowledge of precautions, Flexibility, ROM, Mobility, Decreased knowledge of use of DME, Balance, Skin integrity, Wound, Edema, Pain, IADL       Visit Diagnosis: Lymphedema, not elsewhere classified    Problem List Patient Active  Problem List   Diagnosis Date Noted  . GERD (gastroesophageal reflux disease) 01/20/2019  . Hyperlipidemia 01/20/2019  . Claudication ([place]) 01/07/2019  . Venous ulcer of left leg ([place]) 08/13/2018  . Diabetic polyneuropathy associated with type 2 diabetes mellitus ([place]) 05/30/2018  . Macrocytosis 04/26/2018  . Fecal occult  blood test positive 10/08/2017  . IDA (iron deficiency anemia) 10/08/2017  . Chronic venous insufficiency 12/25/2016  . Lymphedema 12/25/2016  . Diabetes ([place]) 12/25/2016  . Essential hypertension 12/25/2016  . Hx of adenomatous colonic polyps 10/22/2016  . DOE (dyspnea on exertion) 07/22/2016  . CKD (chronic kidney disease) stage 3, GFR 30-59 ml/min 06/23/2016  . Swelling of limb 12/31/2015  . Severe obesity (BMI >= 40) ([place]) 12/31/2015  . Varicose veins of both legs with edema 12/31/2015  . VV (varicose veins) 11/27/2015  . Other iron deficiency anemia 10/08/2015  . Anemia due to stage 3 chronic kidney disease 10/08/2015  . BMI 45.0-49.9, adult ([place]) 10/05/2015  . OSA on CPAP 03/10/2015  . Lumbar stenosis with neurogenic claudication 09/30/2011    Andrey Spearman, MS, OTR/L, [facility] 08/17/19 1:26 PM   [place] MAIN [facility] SERVICES [address] [place], [place], [address] Phone: [phone]   Fax:  [phone]  Name: CINDE EBERT MRN: [id number] Date of Birth: [birth year]

## 2019-08-21 ENCOUNTER — Other Ambulatory Visit: Payer: Self-pay

## 2019-08-21 ENCOUNTER — Ambulatory Visit: Payer: Medicare HMO | Admitting: Occupational Therapy

## 2019-08-21 DIAGNOSIS — I890 Lymphedema, not elsewhere classified: Secondary | ICD-10-CM | POA: Diagnosis not present

## 2019-08-21 NOTE — Therapy (Signed)
[place] MAIN [facility] SERVICES [address] [place], [place], [address] Phone: [phone]   Fax:  [phone]  Occupational Therapy Treatment  Patient Details  Name: Sara Bright MRN: [id number] Date of Birth: [birth year] Referring Provider (OT): Eulogio Ditch, [place]   Encounter Date: 08/21/2019   OT End of Session - 08/21/19 1257    Visit Number 17    Number of Visits 36    Date for OT Re-Evaluation 09/03/19    OT Start Time 1000    OT Stop Time 1110    OT Time Calculation (min) 70 min    Activity Tolerance Patient tolerated treatment well;No increased pain    Behavior During Therapy WFL for tasks assessed/performed           Past Medical History:  Diagnosis Date  . Adenoma of colon   . Adenomatous colon polyp   . Anemia    IDA  . Cataract   . Cervical stenosis of spinal canal   . Chronic kidney disease    stage 3  . Diabetes mellitus without complication ([place])   . GERD (gastroesophageal reflux disease)   . Glaucoma   . Heme positive stool   . History of UTI   . Hx of gallstones   . Hyperkalemia   . Hyperlipidemia   . Hyperlipidemia   . Hypertension   . Lumbar stenosis   . Lymphedema   . Neuromuscular disorder ([place])   . Neuropathy associated with endocrine disorder ([place])   . Obesity   . OSA on CPAP    on CPAP  . Osteoarthritis   . Osteoarthritis   . Port-A-Cath in place   . Retinopathy due to secondary diabetes ([place])   . Secondary hyperparathyroidism of renal origin [facility])     Past Surgical History:  Procedure Laterality Date  . ACDF X2    . BACK SURGERY    . Cataract extraction right    . catarect extraction left    . CHOLECYSTECTOMY    . COLONOSCOPY WITH PROPOFOL N/A 12/02/2016   Procedure: COLONOSCOPY WITH PROPOFOL;  Surgeon: Manya Silvas, MD;  Location: [facility] ENDOSCOPY;  Service: Endoscopy;  Laterality: N/A;  . COLONOSCOPY WITH PROPOFOL N/A 11/10/2017   Procedure: COLONOSCOPY WITH PROPOFOL;  Surgeon:  Toledo, Benay Pike, MD;  Location: [facility] ENDOSCOPY;  Service: Gastroenterology;  Laterality: N/A;  . colonoscopy with removal lesions by snare    . Endoscoic carpal tunnel release    . ESOPHAGOGASTRODUODENOSCOPY N/A 11/10/2017   Procedure: ESOPHAGOGASTRODUODENOSCOPY (EGD);  Surgeon: Toledo, Benay Pike, MD;  Location: [facility] ENDOSCOPY;  Service: Gastroenterology;  Laterality: N/A;  . ESOPHAGOGASTRODUODENOSCOPY (EGD) WITH PROPOFOL N/A 12/02/2016   Procedure: ESOPHAGOGASTRODUODENOSCOPY (EGD) WITH PROPOFOL;  Surgeon: Manya Silvas, MD;  Location: [facility] ENDOSCOPY;  Service: Endoscopy;  Laterality: N/A;  . IR IMAGING GUIDED PORT INSERTION  10/01/2017  . Laminectomy posterior lumbar facetectomy and formaninotomy w/Decomp    . Laminectony posterior cervicle decomp w/Facectomy and foraminotomy    . VEIN LIGATION AND STRIPPING Left     There were no vitals filed for this visit.   Subjective Assessment - 08/21/19 1015    Subjective  Sara Bright presents for OT visit 17/36 to address BLE lymphedema. Pt presents with compressionwraps in place below the knees bilaterally. Pt has no new complaints and denies leg pain.    Patient is accompanied by: Family member    Pertinent History Chronic leg swelling and associated pain > 20 years; , Hx recurrent  ulcers, hx of lymphorrhea, Contributing factors include CKD, stage 3, HTN, Obesity, OSA (has cpap), OA. Other pertinent medical hx include B neuropathy hands and feet, hx vein ligation and stripping s/p 5-6 yrs, cervicle and lumbar laminectomies, glaucoma and diabetic retinopathy    Limitations BLE weakness, impaired balance, impaired sensation hands and feet, impaired vision, increased falls risk, decreased hip AROM, claudication    Repetition Increases Symptoms    Special Tests +Stemmer base of toes bilaterally    Patient Stated Goals Want to stop these leg ulcers from returning, Get leg swelling better under control and keep symptoms from getting worse.    Pain Onset  Other (comment)   >20 YEARS                       OT Treatments/Exercises (OP) - 08/21/19 0001      ADLs   ADL Education Given Yes      Manual Therapy   Manual Therapy Edema management;Compression Bandaging;Manual Lymphatic Drainage (MLD)    Manual Lymphatic Drainage (MLD) MLD to RLE as established    Compression Bandaging Applied gradient compression wraps from foot to popliteal bilaterally reducing from 3 to 2 short stretch wraps over single layer of Rosidal foam.                   OT Education - 08/21/19 1255    Education Details Reviewed Upper and Lower Extremity non-resistive, red theraband exercises for strengthening, stretching therex for increased akle  flexibility and taught hand strengthening therex using red Theraputy.Good return with mod A to slow down.    Person(s) Educated Patient;Child(ren)    Methods Explanation;Demonstration;Handout    Comprehension Verbalized understanding;Returned demonstration;Need further instruction               OT Long Term Goals - 07/17/19 1610      OT LONG TERM GOAL #1   Title Pt will be able to apply BLE, knee length, multi-layer, short stretch compression wraps daily to one leg at a time using correct gradient techniques with MAXIMUM CAREGIVER ASSISTANCE   to achieve optimal limb volume reduction, to return affected limb/s, as closely as possible, to premorbid size and shape, to limit infection risk, and to improve safe functional ambulation and mobility.    Baseline Dependent    Time 4    Period Days    Status Achieved      OT LONG TERM GOAL #2   Title Pt will be able to verbalize signs and symptoms of cellulitis infection and identify at least 4 common lymphedema precautions using printed resource for reference to limit LE progression over time to limit risk of infection and LE exacerbation.    Baseline Max A    Time 4    Period Days    Status Achieved      OT LONG TERM GOAL #3   Title Pt will tolerate  knee length,  multilayer short stretch compression wrap to single leg up to 23/ 7 to achieve optimal swelling reduction to arrest lymphedema progression and in preparation for fitting appropriate compression garments/ devices.    Baseline Max A    Time 6    Period Days    Status Achieved      OT LONG TERM GOAL #4   Title With max CG support daily for compression wrapping  Pt will achieve and sustain at least  85%  compliance with daily LE self-care home program (skin care, lymphatic pumping therex,  compression and simple self-MLD) to reduce and control limb swelling, reduce infection risk and limit LE progression.    Baseline Dependent    Time 12    Period Weeks    Status On-going      OT LONG TERM GOAL #5   Title Pt will achieve at least 10% limb volume reduction in RLE and 5% reduction in LLE belowthe knees to reduce recurrent leg ulcers, reduce infection risk and limit LE progression.   Good progress. 5.53% reduction measured 07/17/19   Baseline dependent    Time 12    Period Weeks    Status Partially Met                 Plan - 08/21/19 1258    Clinical Impression Statement BLE swelling and skin care well managed between visits by family members. Reviewed strengthening ther ex for extremities and introduced theraputty strengthening to improve grip strength for donning and doffing compression garments. Good return with Mod A to slow down and sustain resistive movements. Cont as per POC.    OT Occupational Profile and History Comprehensive Assessment- Review of records and extensive additional review of physical, cognitive, psychosocial history related to current functional performance    Occupational performance deficits (Please refer to evaluation for details): ADL's;Leisure;Social Participation;IADL's;Work    Body Structure / Function / Physical Skills ADL;Decreased knowledge of precautions;Flexibility;ROM;Mobility;Decreased knowledge of use of DME;Balance;Skin  integrity;Wound;Edema;Pain;IADL    Rehab Potential Good    Clinical Decision Making Several treatment options, min-mod task modification necessary    Comorbidities Affecting Occupational Performance: Presence of comorbidities impacting occupational performance    Comorbidities impacting occupational performance description: see SUBJECTIVE for contibuting comorbidities    Modification or Assistance to Complete Evaluation  Max significant modification of tasks or assist is necessary to complete    OT Frequency 2x / week   2-3 x weekly x 12 weeks and prn   OT Duration 12 weeks   and PRN   OT Treatment/Interventions Self-care/ADL training;Therapeutic exercise;Manual lymph drainage;Compression bandaging;Patient/family education;Other (comment);Therapeutic activities;Manual Therapy;DME and/or AE instruction   SKIN CARE   Plan CDT: MLD, gradient wraps, ther ex, skin care,    OT Home Exercise Plan Caregiver to apply compression wraps daily during visit intervals and assist with donning / doffing compression consistently    Recommended Other Services Consider Velcro-style, adjustable , knee length Jobst, CircAid cmpression wraps as an alternative to traditional elastic stockings for ease of donning    Consulted and Agree with Plan of Care Patient           Patient will benefit from skilled therapeutic intervention in order to improve the following deficits and impairments:   Body Structure / Function / Physical Skills: ADL, Decreased knowledge of precautions, Flexibility, ROM, Mobility, Decreased knowledge of use of DME, Balance, Skin integrity, Wound, Edema, Pain, IADL       Visit Diagnosis: Lymphedema, not elsewhere classified    Problem List Patient Active Problem List   Diagnosis Date Noted  . GERD (gastroesophageal reflux disease) 01/20/2019  . Hyperlipidemia 01/20/2019  . Claudication ([place]) 01/07/2019  . Venous ulcer of left leg ([place]) 08/13/2018  . Diabetic polyneuropathy associated  with type 2 diabetes mellitus ([place]) 05/30/2018  . Macrocytosis 04/26/2018  . Fecal occult blood test positive 10/08/2017  . IDA (iron deficiency anemia) 10/08/2017  . Chronic venous insufficiency 12/25/2016  . Lymphedema 12/25/2016  . Diabetes ([place]) 12/25/2016  . Essential hypertension 12/25/2016  . Hx of adenomatous colonic polyps 10/22/2016  .  DOE (dyspnea on exertion) 07/22/2016  . CKD (chronic kidney disease) stage 3, GFR 30-59 ml/min 06/23/2016  . Swelling of limb 12/31/2015  . Severe obesity (BMI >= 40) ([place]) 12/31/2015  . Varicose veins of both legs with edema 12/31/2015  . VV (varicose veins) 11/27/2015  . Other iron deficiency anemia 10/08/2015  . Anemia due to stage 3 chronic kidney disease 10/08/2015  . BMI 45.0-49.9, adult ([place]) 10/05/2015  . OSA on CPAP 03/10/2015  . Lumbar stenosis with neurogenic claudication 09/30/2011    Andrey Spearman, MS, OTR/L, [facility] 08/21/19 1:03 PM   [place] MAIN [facility] SERVICES [address] [place], [place], [address] Phone: [phone]   Fax:  [phone]  Name: Sara Bright MRN: [id number] Date of Birth: Jul 30, 1940

## 2019-08-22 ENCOUNTER — Ambulatory Visit: Payer: Medicare HMO | Admitting: Occupational Therapy

## 2019-08-24 ENCOUNTER — Ambulatory Visit: Payer: Medicare HMO | Admitting: Occupational Therapy

## 2019-08-24 ENCOUNTER — Other Ambulatory Visit: Payer: Self-pay

## 2019-08-24 DIAGNOSIS — I890 Lymphedema, not elsewhere classified: Secondary | ICD-10-CM

## 2019-08-24 NOTE — Therapy (Signed)
[facility] MAIN REHAB SERVICES [address] [place], [place], [address] Phone: [phone]   Fax:  [phone]  Occupational Therapy Treatment  Patient Details  Name: Sara Bright MRN: [id number] Date of Birth: 09/22/1940 Referring Provider (OT): Fallon Brown, NPP   Encounter Date: 08/24/2019   OT End of Session - 08/24/19 1253    Visit Number 18    Number of Visits 36    Date for OT Re-Evaluation 09/03/19    OT Start Time 1008    OT Stop Time 1120    OT Time Calculation (min) 72 min    Activity Tolerance Patient tolerated treatment well;No increased pain    Behavior During Therapy WFL for tasks assessed/performed           Past Medical History:  Diagnosis Date  . Adenoma of colon   . Adenomatous colon polyp   . Anemia    IDA  . Cataract   . Cervical stenosis of spinal canal   . Chronic kidney disease    stage 3  . Diabetes mellitus without complication ([facility])   . GERD (gastroesophageal reflux disease)   . Glaucoma   . Heme positive stool   . History of UTI   . Hx of gallstones   . Hyperkalemia   . Hyperlipidemia   . Hyperlipidemia   . Hypertension   . Lumbar stenosis   . Lymphedema   . Neuromuscular disorder ([facility])   . Neuropathy associated with endocrine disorder ([facility])   . Obesity   . OSA on CPAP    on CPAP  . Osteoarthritis   . Osteoarthritis   . Port-A-Cath in place   . Retinopathy due to secondary diabetes ([facility])   . Secondary hyperparathyroidism of renal origin ([facility])     Past Surgical History:  Procedure Laterality Date  . ACDF X2    . BACK SURGERY    . Cataract extraction right    . catarect extraction left    . CHOLECYSTECTOMY    . COLONOSCOPY WITH PROPOFOL N/A 12/02/2016   Procedure: COLONOSCOPY WITH PROPOFOL;  Surgeon: Elliott, Robert T, MD;  Location: [facility] ENDOSCOPY;  Service: Endoscopy;  Laterality: N/A;  . COLONOSCOPY WITH PROPOFOL N/A 11/10/2017   Procedure: COLONOSCOPY WITH PROPOFOL;  Surgeon:  Toledo, Teodoro K, MD;  Location: [facility] ENDOSCOPY;  Service: Gastroenterology;  Laterality: N/A;  . colonoscopy with removal lesions by snare    . Endoscoic carpal tunnel release    . ESOPHAGOGASTRODUODENOSCOPY N/A 11/10/2017   Procedure: ESOPHAGOGASTRODUODENOSCOPY (EGD);  Surgeon: Toledo, Teodoro K, MD;  Location: [facility] ENDOSCOPY;  Service: Gastroenterology;  Laterality: N/A;  . ESOPHAGOGASTRODUODENOSCOPY (EGD) WITH PROPOFOL N/A 12/02/2016   Procedure: ESOPHAGOGASTRODUODENOSCOPY (EGD) WITH PROPOFOL;  Surgeon: Elliott, Robert T, MD;  Location: [facility] ENDOSCOPY;  Service: Endoscopy;  Laterality: N/A;  . IR IMAGING GUIDED PORT INSERTION  10/01/2017  . Laminectomy posterior lumbar facetectomy and formaninotomy w/Decomp    . Laminectony posterior cervicle decomp w/Facectomy and foraminotomy    . VEIN LIGATION AND STRIPPING Left     There were no vitals filed for this visit.   Subjective Assessment - 08/24/19 1250    Subjective  Sara Bright presents for OT visit 18/36 to address BLE lymphedema. Pt presents with compressionwraps in place below the knees bilaterally. Pt has no new complaints and denies leg pain.    Patient is accompanied by: Family member    Pertinent History Chronic leg swelling and associated pain > 20 years; , Hx recurrent    ulcers, hx of lymphorrhea, Contributing factors include CKD, stage 3, HTN, Obesity, OSA (has cpap), OA. Other pertinent medical hx include B neuropathy hands and feet, hx vein ligation and stripping s/p 5-6 yrs, cervicle and lumbar laminectomies, glaucoma and diabetic retinopathy    Limitations BLE weakness, impaired balance, impaired sensation hands and feet, impaired vision, increased falls risk, decreased hip AROM, claudication    Repetition Increases Symptoms    Special Tests +Stemmer base of toes bilaterally    Patient Stated Goals Want to stop these leg ulcers from returning, Get leg swelling better under control and keep symptoms from getting worse.    Pain Onset  Other (comment)   >20 YEARS                       OT Treatments/Exercises (OP) - 08/24/19 0001      ADLs   ADL Education Given Yes      Manual Therapy   Manual Therapy Edema management;Compression Bandaging;Manual Lymphatic Drainage (MLD)    Manual therapy comments BLE anatomical measurements for custom compression garments    Compression Bandaging Applied gradient compression wraps from foot to popliteal bilaterally reducing from 3 to 2 short stretch wraps over single layer of Rosidal foam.                   OT Education - 08/24/19 1252    Education Details Pt edu re process for measuring and fitting custom compression garments. Assisted Pt complete form for [facility] [employer].    Person(s) Educated Patient;Child(ren)    Methods Explanation;Demonstration;Handout    Comprehension Verbalized understanding;Returned demonstration;Need further instruction               OT Long Term Goals - 07/17/19 1610      OT LONG TERM GOAL #1   Title Pt will be able to apply BLE, knee length, multi-layer, short stretch compression wraps daily to one leg at a time using correct gradient techniques with MAXIMUM CAREGIVER ASSISTANCE   to achieve optimal limb volume reduction, to return affected limb/s, as closely as possible, to premorbid size and shape, to limit infection risk, and to improve safe functional ambulation and mobility.    Baseline Dependent    Time 4    Period Days    Status Achieved      OT LONG TERM GOAL #2   Title Pt will be able to verbalize signs and symptoms of cellulitis infection and identify at least 4 common lymphedema precautions using printed resource for reference to limit LE progression over time to limit risk of infection and LE exacerbation.    Baseline Max A    Time 4    Period Days    Status Achieved      OT LONG TERM GOAL #3   Title Pt will tolerate knee length,  multilayer short stretch compression wrap to single leg up to 23/  7 to achieve optimal swelling reduction to arrest lymphedema progression and in preparation for fitting appropriate compression garments/ devices.    Baseline Max A    Time 6    Period Days    Status Achieved      OT LONG TERM GOAL #4   Title With max CG support daily for compression wrapping  Pt will achieve and sustain at least  85%  compliance with daily LE self-care home program (skin care, lymphatic pumping therex, compression and simple self-MLD) to reduce and control limb swelling, reduce infection risk   and limit LE progression.    Baseline Dependent    Time 12    Period Weeks    Status On-going      OT LONG TERM GOAL #5   Title Pt will achieve at least 10% limb volume reduction in RLE and 5% reduction in LLE belowthe knees to reduce recurrent leg ulcers, reduce infection risk and limit LE progression.   Good progress. 5.53% reduction measured 07/17/19   Baseline dependent    Time 12    Period Weeks    Status Partially Met                 Plan - 08/24/19 1254    Clinical Impression Statement Completed anatomical measurements for [employer] custom Mondi, ccl 3 compression knee highs. Submitted application to [employer]. Faxed measurewments to vendor.    OT Occupational Profile and History Comprehensive Assessment- Review of records and extensive additional review of physical, cognitive, psychosocial history related to current functional performance    Occupational performance deficits (Please refer to evaluation for details): ADL's;Leisure;Social Participation;IADL's;Work    Body Structure / Function / Physical Skills ADL;Decreased knowledge of precautions;Flexibility;ROM;Mobility;Decreased knowledge of use of DME;Balance;Skin integrity;Wound;Edema;Pain;IADL    Rehab Potential Good    Clinical Decision Making Several treatment options, min-mod task modification necessary    Comorbidities Affecting Occupational Performance: Presence of comorbidities impacting  occupational performance    Comorbidities impacting occupational performance description: see SUBJECTIVE for contibuting comorbidities    Modification or Assistance to Complete Evaluation  Max significant modification of tasks or assist is necessary to complete    OT Frequency 2x / week   2-3 x weekly x 12 weeks and prn   OT Duration 12 weeks   and PRN   OT Treatment/Interventions Self-care/ADL training;Therapeutic exercise;Manual lymph drainage;Compression bandaging;Patient/family education;Other (comment);Therapeutic activities;Manual Therapy;DME and/or AE instruction   SKIN CARE   Plan CDT: MLD, gradient wraps, ther ex, skin care,    OT Home Exercise Plan Caregiver to apply compression wraps daily during visit intervals and assist with donning / doffing compression consistently    Recommended Other Services Consider Velcro-style, adjustable , knee length Jobst, CircAid cmpression wraps as an alternative to traditional elastic stockings for ease of donning    Consulted and Agree with Plan of Care Patient           Patient will benefit from skilled therapeutic intervention in order to improve the following deficits and impairments:   Body Structure / Function / Physical Skills: ADL, Decreased knowledge of precautions, Flexibility, ROM, Mobility, Decreased knowledge of use of DME, Balance, Skin integrity, Wound, Edema, Pain, IADL       Visit Diagnosis: Lymphedema, not elsewhere classified    Problem List Patient Active Problem List   Diagnosis Date Noted  . GERD (gastroesophageal reflux disease) 01/20/2019  . Hyperlipidemia 01/20/2019  . Claudication ([place]) 01/07/2019  . Venous ulcer of left leg ([place]) 08/13/2018  . Diabetic polyneuropathy associated with type 2 diabetes mellitus ([place]) 05/30/2018  . Macrocytosis 04/26/2018  . Fecal occult blood test positive 10/08/2017  . IDA (iron deficiency anemia) 10/08/2017  . Chronic venous insufficiency 12/25/2016  . Lymphedema 12/25/2016    . Diabetes ([place]) 12/25/2016  . Essential hypertension 12/25/2016  . Hx of adenomatous colonic polyps 10/22/2016  . DOE (dyspnea on exertion) 07/22/2016  . CKD (chronic kidney disease) stage 3, GFR 30-59 ml/min 06/23/2016  . Swelling of limb 12/31/2015  . Severe obesity (BMI >= 40) ([place]) 12/31/2015  . Varicose veins of both  legs with edema 12/31/2015  . VV (varicose veins) 11/27/2015  . Other iron deficiency anemia 10/08/2015  . Anemia due to stage 3 chronic kidney disease 10/08/2015  . BMI 45.0-49.9, adult ([facility]) 10/05/2015  . OSA on CPAP 03/10/2015  . Lumbar stenosis with neurogenic claudication 09/30/2011    Theresa Gilliam, MS, OTR/L, CLT-[facility] 08/24/19 12:59 PM   [place] [facility] MAIN REHAB SERVICES [address] [place], [place], [address] Phone: [phone]   Fax:  [phone]  Name: Sara Bright MRN: [id number] Date of Birth: 01/12/1941 

## 2019-08-28 ENCOUNTER — Other Ambulatory Visit: Payer: Self-pay

## 2019-08-28 ENCOUNTER — Ambulatory Visit: Payer: Medicare HMO | Admitting: Occupational Therapy

## 2019-08-28 DIAGNOSIS — I890 Lymphedema, not elsewhere classified: Secondary | ICD-10-CM | POA: Diagnosis not present

## 2019-08-28 NOTE — Therapy (Signed)
[place] MAIN [facility] SERVICES [address] [place], [place], [address] Phone: [phone]   Fax:  [phone]  Occupational Therapy Treatment  Patient Details  Name: Sara Bright MRN: [id number] Date of Birth: 04/13/1940 Referring Provider (OT): Eulogio Ditch, [place]   Encounter Date: 08/28/2019   OT End of Session - 08/28/19 1650    Visit Number 19    Number of Visits 36    Date for OT Re-Evaluation 09/03/19    OT Start Time 1010    OT Stop Time 1115    OT Time Calculation (min) 65 min    Activity Tolerance Patient tolerated treatment well;No increased pain    Behavior During Therapy WFL for tasks assessed/performed           Past Medical History:  Diagnosis Date  . Adenoma of colon   . Adenomatous colon polyp   . Anemia    IDA  . Cataract   . Cervical stenosis of spinal canal   . Chronic kidney disease    stage 3  . Diabetes mellitus without complication ([place])   . GERD (gastroesophageal reflux disease)   . Glaucoma   . Heme positive stool   . History of UTI   . Hx of gallstones   . Hyperkalemia   . Hyperlipidemia   . Hyperlipidemia   . Hypertension   . Lumbar stenosis   . Lymphedema   . Neuromuscular disorder ([place])   . Neuropathy associated with endocrine disorder ([place])   . Obesity   . OSA on CPAP    on CPAP  . Osteoarthritis   . Osteoarthritis   . Port-A-Cath in place   . Retinopathy due to secondary diabetes ([place])   . Secondary hyperparathyroidism of renal origin [facility])     Past Surgical History:  Procedure Laterality Date  . ACDF X2    . BACK SURGERY    . Cataract extraction right    . catarect extraction left    . CHOLECYSTECTOMY    . COLONOSCOPY WITH PROPOFOL N/A 12/02/2016   Procedure: COLONOSCOPY WITH PROPOFOL;  Surgeon: Manya Silvas, MD;  Location: [facility] ENDOSCOPY;  Service: Endoscopy;  Laterality: N/A;  . COLONOSCOPY WITH PROPOFOL N/A 11/10/2017   Procedure: COLONOSCOPY WITH PROPOFOL;  Surgeon:  Toledo, Benay Pike, MD;  Location: [facility] ENDOSCOPY;  Service: Gastroenterology;  Laterality: N/A;  . colonoscopy with removal lesions by snare    . Endoscoic carpal tunnel release    . ESOPHAGOGASTRODUODENOSCOPY N/A 11/10/2017   Procedure: ESOPHAGOGASTRODUODENOSCOPY (EGD);  Surgeon: Toledo, Benay Pike, MD;  Location: [facility] ENDOSCOPY;  Service: Gastroenterology;  Laterality: N/A;  . ESOPHAGOGASTRODUODENOSCOPY (EGD) WITH PROPOFOL N/A 12/02/2016   Procedure: ESOPHAGOGASTRODUODENOSCOPY (EGD) WITH PROPOFOL;  Surgeon: Manya Silvas, MD;  Location: [facility] ENDOSCOPY;  Service: Endoscopy;  Laterality: N/A;  . IR IMAGING GUIDED PORT INSERTION  10/01/2017  . Laminectomy posterior lumbar facetectomy and formaninotomy w/Decomp    . Laminectony posterior cervicle decomp w/Facectomy and foraminotomy    . VEIN LIGATION AND STRIPPING Left     There were no vitals filed for this visit.   Subjective Assessment - 08/28/19 1648    Subjective  Sara Bright presents for OT visit 19/36 to address BLE lymphedema. Pt presents with compression wraps in place below the knees bilaterally. Pt denies leg pain this morning. Pt informed that [facility] approved dunding for lymphedema garments we ordered last week.    Patient is accompanied by: Family member    Pertinent History  Chronic leg swelling and associated pain > 20 years; , Hx recurrent  ulcers, hx of lymphorrhea, Contributing factors include CKD, stage 3, HTN, Obesity, OSA (has cpap), OA. Other pertinent medical hx include B neuropathy hands and feet, hx vein ligation and stripping s/p 5-6 yrs, cervicle and lumbar laminectomies, glaucoma and diabetic retinopathy    Limitations BLE weakness, impaired balance, impaired sensation hands and feet, impaired vision, increased falls risk, decreased hip AROM, claudication    Repetition Increases Symptoms    Special Tests +Stemmer base of toes bilaterally    Patient Stated Goals Want to stop these leg ulcers from  returning, Get leg swelling better under control and keep symptoms from getting worse.    Pain Onset Other (comment)   >20 YEARS                       OT Treatments/Exercises (OP) - 08/28/19 0001      ADLs   ADL Education Given Yes      Manual Therapy   Manual Therapy Edema management;Compression Bandaging;Manual Lymphatic Drainage (MLD)    Manual Lymphatic Drainage (MLD) MLD to RLE as established    Compression Bandaging Applied gradient compression wraps from foot to popliteal bilaterally reducing from 3 to 2 short stretch wraps over single layer of Rosidal foam.                   OT Education - 08/28/19 1650    Education Details Continued skilled Pt/caregiver education  And LE ADL training throughout visit for lymphedema self care/ home program, including compression wrapping, compression garment and device wear/care, lymphatic pumping ther ex, simple self-MLD, and skin care. Discussed progress towards goals.    Person(s) Educated Patient;Child(ren)    Methods Explanation;Demonstration;Handout    Comprehension Verbalized understanding;Returned demonstration;Need further instruction               OT Long Term Goals - 07/17/19 1610      OT LONG TERM GOAL #1   Title Pt will be able to apply BLE, knee length, multi-layer, short stretch compression wraps daily to one leg at a time using correct gradient techniques with MAXIMUM CAREGIVER ASSISTANCE   to achieve optimal limb volume reduction, to return affected limb/s, as closely as possible, to premorbid size and shape, to limit infection risk, and to improve safe functional ambulation and mobility.    Baseline Dependent    Time 4    Period Days    Status Achieved      OT LONG TERM GOAL #2   Title Pt will be able to verbalize signs and symptoms of cellulitis infection and identify at least 4 common lymphedema precautions using printed resource for reference to limit LE progression over time to limit risk of  infection and LE exacerbation.    Baseline Max A    Time 4    Period Days    Status Achieved      OT LONG TERM GOAL #3   Title Pt will tolerate knee length,  multilayer short stretch compression wrap to single leg up to 23/ 7 to achieve optimal swelling reduction to arrest lymphedema progression and in preparation for fitting appropriate compression garments/ devices.    Baseline Max A    Time 6    Period Days    Status Achieved      OT LONG TERM GOAL #4   Title With max CG support daily for compression wrapping  Pt will achieve and  sustain at least  85%  compliance with daily LE self-care home program (skin care, lymphatic pumping therex, compression and simple self-MLD) to reduce and control limb swelling, reduce infection risk and limit LE progression.    Baseline Dependent    Time 12    Period Weeks    Status On-going      OT LONG TERM GOAL #5   Title Pt will achieve at least 10% limb volume reduction in RLE and 5% reduction in LLE belowthe knees to reduce recurrent leg ulcers, reduce infection risk and limit LE progression.   Good progress. 5.53% reduction measured 07/17/19   Baseline dependent    Time 12    Period Weeks    Status Partially Met                 Plan - 08/28/19 1652    Clinical Impression Statement Sara Bright tolerated all aspects of OT for CDT today without pain or other difficulty. Skin condition continues to improve over time with excellent compliance with perscribed skin care home program using low ph lotion to restor acidic matric to skin. Swellihng is well managed between visits. Family members continue to support all aspects opf home program with assistance. LLE compression garments have been ordered and CDT is shifted towards RLE CDT. Cont as per POC.    OT Occupational Profile and History Comprehensive Assessment- Review of records and extensive additional review of physical, cognitive, psychosocial history related to current functional performance      Occupational performance deficits (Please refer to evaluation for details): ADL's;Leisure;Social Participation;IADL's;Work    Body Structure / Function / Physical Skills ADL;Decreased knowledge of precautions;Flexibility;ROM;Mobility;Decreased knowledge of use of DME;Balance;Skin integrity;Wound;Edema;Pain;IADL    Rehab Potential Good    Clinical Decision Making Several treatment options, min-mod task modification necessary    Comorbidities Affecting Occupational Performance: Presence of comorbidities impacting occupational performance    Comorbidities impacting occupational performance description: see SUBJECTIVE for contibuting comorbidities    Modification or Assistance to Complete Evaluation  Max significant modification of tasks or assist is necessary to complete    OT Frequency 2x / week   2-3 x weekly x 12 weeks and prn   OT Duration 12 weeks   and PRN   OT Treatment/Interventions Self-care/ADL training;Therapeutic exercise;Manual lymph drainage;Compression bandaging;Patient/family education;Other (comment);Therapeutic activities;Manual Therapy;DME and/or AE instruction   SKIN CARE   Plan CDT: MLD, gradient wraps, ther ex, skin care,    OT Home Exercise Plan Caregiver to apply compression wraps daily during visit intervals and assist with donning / doffing compression consistently    Recommended Other Services Consider Velcro-style, adjustable , knee length Jobst, CircAid cmpression wraps as an alternative to traditional elastic stockings for ease of donning    Consulted and Agree with Plan of Care Patient           Patient will benefit from skilled therapeutic intervention in order to improve the following deficits and impairments:   Body Structure / Function / Physical Skills: ADL, Decreased knowledge of precautions, Flexibility, ROM, Mobility, Decreased knowledge of use of DME, Balance, Skin integrity, Wound, Edema, Pain, IADL       Visit Diagnosis: Lymphedema, not elsewhere  classified    Problem List Patient Active Problem List   Diagnosis Date Noted  . GERD (gastroesophageal reflux disease) 01/20/2019  . Hyperlipidemia 01/20/2019  . Claudication ([place]) 01/07/2019  . Venous ulcer of left leg ([place]) 08/13/2018  . Diabetic polyneuropathy associated with type 2 diabetes mellitus ([place]) 05/30/2018  .  Macrocytosis 04/26/2018  . Fecal occult blood test positive 10/08/2017  . IDA (iron deficiency anemia) 10/08/2017  . Chronic venous insufficiency 12/25/2016  . Lymphedema 12/25/2016  . Diabetes ([place]) 12/25/2016  . Essential hypertension 12/25/2016  . Hx of adenomatous colonic polyps 10/22/2016  . DOE (dyspnea on exertion) 07/22/2016  . CKD (chronic kidney disease) stage 3, GFR 30-59 ml/min 06/23/2016  . Swelling of limb 12/31/2015  . Severe obesity (BMI >= 40) ([place]) 12/31/2015  . Varicose veins of both legs with edema 12/31/2015  . VV (varicose veins) 11/27/2015  . Other iron deficiency anemia 10/08/2015  . Anemia due to stage 3 chronic kidney disease 10/08/2015  . BMI 45.0-49.9, adult ([place]) 10/05/2015  . OSA on CPAP 03/10/2015  . Lumbar stenosis with neurogenic claudication 09/30/2011    Andrey Spearman, MS, OTR/L, [facility] 08/28/19 4:57 PM   [place] MAIN [facility] SERVICES [address] [place], [place], [address] Phone: [phone]   Fax:  [phone]  Name: Sara Bright MRN: [id number] Date of Birth: 1940/05/27

## 2019-08-29 ENCOUNTER — Inpatient Hospital Stay: Payer: Medicare HMO

## 2019-08-29 ENCOUNTER — Other Ambulatory Visit: Payer: Self-pay

## 2019-08-29 ENCOUNTER — Ambulatory Visit: Payer: Medicare HMO

## 2019-08-29 ENCOUNTER — Ambulatory Visit: Payer: Medicare HMO | Admitting: Occupational Therapy

## 2019-08-29 VITALS — BP 138/74 | HR 62 | Temp 98.1°F | Resp 20

## 2019-08-29 DIAGNOSIS — I129 Hypertensive chronic kidney disease with stage 1 through stage 4 chronic kidney disease, or unspecified chronic kidney disease: Secondary | ICD-10-CM | POA: Diagnosis not present

## 2019-08-29 DIAGNOSIS — N183 Chronic kidney disease, stage 3 unspecified: Secondary | ICD-10-CM

## 2019-08-29 DIAGNOSIS — D631 Anemia in chronic kidney disease: Secondary | ICD-10-CM

## 2019-08-29 DIAGNOSIS — N1832 Chronic kidney disease, stage 3b: Secondary | ICD-10-CM

## 2019-08-29 LAB — HEMOGLOBIN AND HEMATOCRIT, BLOOD
HCT: 29.8 % — ABNORMAL LOW (ref 36.0–46.0)
Hemoglobin: 9.5 g/dL — ABNORMAL LOW (ref 12.0–15.0)

## 2019-08-29 MED ORDER — EPOETIN ALFA-EPBX 10000 UNIT/ML IJ SOLN
10000.0000 [IU] | Freq: Once | INTRAMUSCULAR | Status: AC
  Administered 2019-08-29: 10000 [IU] via SUBCUTANEOUS

## 2019-08-31 ENCOUNTER — Ambulatory Visit: Payer: Medicare HMO | Admitting: Occupational Therapy

## 2019-08-31 ENCOUNTER — Other Ambulatory Visit: Payer: Self-pay

## 2019-08-31 DIAGNOSIS — I890 Lymphedema, not elsewhere classified: Secondary | ICD-10-CM | POA: Diagnosis not present

## 2019-08-31 NOTE — Therapy (Signed)
[place] MAIN [facility] SERVICES [address] [place], [place], [address] Phone: [phone]   Fax:  [phone]  Occupational Therapy Treatment  Patient Details  Name: Sara Bright MRN: [id number] Date of Birth: [birth year] Referring Provider (OT): Sara Bright, [place]   Encounter Date: 08/31/2019   OT End of Session - 08/31/19 1343    Visit Number 20    Number of Visits 36    Date for OT Re-Evaluation 09/03/19    OT Start Time 1010    OT Stop Time 1110    OT Time Calculation (min) 60 min    Activity Tolerance Patient tolerated treatment well;No increased pain    Behavior During Therapy WFL for tasks assessed/performed           Past Medical History:  Diagnosis Date  . Adenoma of colon   . Adenomatous colon polyp   . Anemia    IDA  . Cataract   . Cervical stenosis of spinal canal   . Chronic kidney disease    stage 3  . Diabetes mellitus without complication (Sara Bright)   . GERD (gastroesophageal reflux disease)   . Glaucoma   . Heme positive stool   . History of UTI   . Hx of gallstones   . Hyperkalemia   . Hyperlipidemia   . Hyperlipidemia   . Hypertension   . Lumbar stenosis   . Lymphedema   . Neuromuscular disorder ([place])   . Neuropathy associated with endocrine disorder ([place])   . Obesity   . OSA on CPAP    on CPAP  . Osteoarthritis   . Osteoarthritis   . Port-A-Cath in place   . Retinopathy due to secondary diabetes ([place])   . Secondary hyperparathyroidism of renal origin [facility])     Past Surgical History:  Procedure Laterality Date  . ACDF X2    . BACK SURGERY    . Cataract extraction right    . catarect extraction left    . CHOLECYSTECTOMY    . COLONOSCOPY WITH PROPOFOL N/A 12/02/2016   Procedure: COLONOSCOPY WITH PROPOFOL;  Surgeon: Manya Silvas, MD;  Location: [facility] ENDOSCOPY;  Service: Endoscopy;  Laterality: N/A;  . COLONOSCOPY WITH PROPOFOL N/A 11/10/2017   Procedure: COLONOSCOPY WITH PROPOFOL;  Surgeon:  Toledo, Benay Pike, MD;  Location: [facility] ENDOSCOPY;  Service: Gastroenterology;  Laterality: N/A;  . colonoscopy with removal lesions by snare    . Endoscoic carpal tunnel release    . ESOPHAGOGASTRODUODENOSCOPY N/A 11/10/2017   Procedure: ESOPHAGOGASTRODUODENOSCOPY (EGD);  Surgeon: Toledo, Benay Pike, MD;  Location: [facility] ENDOSCOPY;  Service: Gastroenterology;  Laterality: N/A;  . ESOPHAGOGASTRODUODENOSCOPY (EGD) WITH PROPOFOL N/A 12/02/2016   Procedure: ESOPHAGOGASTRODUODENOSCOPY (EGD) WITH PROPOFOL;  Surgeon: Manya Silvas, MD;  Location: [facility] ENDOSCOPY;  Service: Endoscopy;  Laterality: N/A;  . IR IMAGING GUIDED PORT INSERTION  10/01/2017  . Laminectomy posterior lumbar facetectomy and formaninotomy w/Decomp    . Laminectony posterior cervicle decomp w/Facectomy and foraminotomy    . VEIN LIGATION AND STRIPPING Left     There were no vitals filed for this visit.   Subjective Assessment - 08/31/19 1020    Subjective  Sara Bright presents for OT visit 20/36 to address BLE lymphedema. Pt presents with compression wraps in place below the knees bilaterally. Pt c/o R ankle pain in the joint this morning, rated 5/10.    Patient is accompanied by: Family member    Pertinent History Chronic leg swelling and associated pain > 20 years; ,  Hx recurrent  ulcers, hx of lymphorrhea, Contributing factors include CKD, stage 3, HTN, Obesity, OSA (has cpap), OA. Other pertinent medical hx include B neuropathy hands and feet, hx vein ligation and stripping s/p 5-6 yrs, cervicle and lumbar laminectomies, glaucoma and diabetic retinopathy    Limitations BLE weakness, impaired balance, impaired sensation hands and feet, impaired vision, increased falls risk, decreased hip AROM, claudication    Repetition Increases Symptoms    Special Tests +Stemmer base of toes bilaterally    Patient Stated Goals Want to stop these leg ulcers from returning, Get leg swelling better under control and keep symptoms from getting worse.     Pain Onset Other (comment)   >20 YEARS              LYMPHEDEMA/ONCOLOGY QUESTIONNAIRE - 08/31/19 0001      Right Lower Extremity Lymphedema   Other LLE A-D ( ankle to knee) limb volume measures 4271.1 ml.    Other LLE limb volume is DEcreased by  9.18% since we commenced CDT on 06/13/19.                   OT Treatments/Exercises (OP) - 08/31/19 0001      ADLs   ADL Education Given Yes      Manual Therapy   Manual Therapy Edema management;Compression Bandaging;Manual Lymphatic Drainage (MLD)    Manual therapy comments RLE comparative limb volumetrics.    Manual Lymphatic Drainage (MLD) MLD to RLE as established    Compression Bandaging Applied gradient compression wraps from foot to popliteal bilaterally reducing from 3 to 2 short stretch wraps over single layer of Rosidal foam.                   OT Education - 08/31/19 1342    Education Details Continued skilled Pt/caregiver education  And LE ADL training throughout visit for lymphedema self care/ home program, including compression wrapping, compression garment and device wear/care, lymphatic pumping ther ex, simple self-MLD, and skin care. Discussed progress towards goals.    Person(s) Educated Patient;Child(ren)    Methods Explanation;Demonstration;Handout    Comprehension Verbalized understanding;Returned demonstration;Need further instruction               OT Long Term Goals - 07/17/19 1610      OT LONG TERM GOAL #1   Title Pt will be able to apply BLE, knee length, multi-layer, short stretch compression wraps daily to one leg at a time using correct gradient techniques with MAXIMUM CAREGIVER ASSISTANCE   to achieve optimal limb volume reduction, to return affected limb/s, as closely as possible, to premorbid size and shape, to limit infection risk, and to improve safe functional ambulation and mobility.    Baseline Dependent    Time 4    Period Days    Status Achieved      OT LONG TERM GOAL  #2   Title Pt will be able to verbalize signs and symptoms of cellulitis infection and identify at least 4 common lymphedema precautions using printed resource for reference to limit LE progression over time to limit risk of infection and LE exacerbation.    Baseline Max A    Time 4    Period Days    Status Achieved      OT LONG TERM GOAL #3   Title Pt will tolerate knee length,  multilayer short stretch compression wrap to single leg up to 23/ 7 to achieve optimal swelling reduction to arrest lymphedema progression and  in preparation for fitting appropriate compression garments/ devices.    Baseline Max A    Time 6    Period Days    Status Achieved      OT LONG TERM GOAL #4   Title With max CG support daily for compression wrapping  Pt will achieve and sustain at least  85%  compliance with daily LE self-care home program (skin care, lymphatic pumping therex, compression and simple self-MLD) to reduce and control limb swelling, reduce infection risk and limit LE progression.    Baseline Dependent    Time 12    Period Weeks    Status On-going      OT LONG TERM GOAL #5   Title Pt will achieve at least 10% limb volume reduction in RLE and 5% reduction in LLE belowthe knees to reduce recurrent leg ulcers, reduce infection risk and limit LE progression.   Good progress. 5.53% reduction measured 07/17/19   Baseline dependent    Time 12    Period Weeks    Status Partially Met                 Plan - 08/31/19 1344    Clinical Impression Statement LLE comparative limb volumetrics reveal Pt is close to achieving volumetric reduction goal for that limb below the knee. LLE limb volume is DEcreased by  9.18% since we commenced CDT on 06/13/19. Pt tolerated MLD, skin care and compression wraps as established without increased pain today. Cont as per POC. Awaiting RLE garment deliver so we can complete fitting and measure LLE. Cont as per POC.    OT Occupational Profile and History Comprehensive  Assessment- Review of records and extensive additional review of physical, cognitive, psychosocial history related to current functional performance    Occupational performance deficits (Please refer to evaluation for details): ADL's;Leisure;Social Participation;IADL's;Work    Body Structure / Function / Physical Skills ADL;Decreased knowledge of precautions;Flexibility;ROM;Mobility;Decreased knowledge of use of DME;Balance;Skin integrity;Wound;Edema;Pain;IADL    Rehab Potential Good    Clinical Decision Making Several treatment options, min-mod task modification necessary    Comorbidities Affecting Occupational Performance: Presence of comorbidities impacting occupational performance    Comorbidities impacting occupational performance description: see SUBJECTIVE for contibuting comorbidities    Modification or Assistance to Complete Evaluation  Max significant modification of tasks or assist is necessary to complete    OT Frequency 2x / week   2-3 x weekly x 12 weeks and prn   OT Duration 12 weeks   and PRN   OT Treatment/Interventions Self-care/ADL training;Therapeutic exercise;Manual lymph drainage;Compression bandaging;Patient/family education;Other (comment);Therapeutic activities;Manual Therapy;DME and/or AE instruction   SKIN CARE   Plan CDT: MLD, gradient wraps, ther ex, skin care,    OT Home Exercise Plan Caregiver to apply compression wraps daily during visit intervals and assist with donning / doffing compression consistently    Recommended Other Services Consider Velcro-style, adjustable , knee length Jobst, CircAid cmpression wraps as an alternative to traditional elastic stockings for ease of donning    Consulted and Agree with Plan of Care Patient           Patient will benefit from skilled therapeutic intervention in order to improve the following deficits and impairments:   Body Structure / Function / Physical Skills: ADL, Decreased knowledge of precautions, Flexibility, ROM,  Mobility, Decreased knowledge of use of DME, Balance, Skin integrity, Wound, Edema, Pain, IADL       Visit Diagnosis: Lymphedema, not elsewhere classified    Problem List Patient Active  Problem List   Diagnosis Date Noted  . GERD (gastroesophageal reflux disease) 01/20/2019  . Hyperlipidemia 01/20/2019  . Claudication ([place]) 01/07/2019  . Venous ulcer of left leg ([place]) 08/13/2018  . Diabetic polyneuropathy associated with type 2 diabetes mellitus ([place]) 05/30/2018  . Macrocytosis 04/26/2018  . Fecal occult blood test positive 10/08/2017  . IDA (iron deficiency anemia) 10/08/2017  . Chronic venous insufficiency 12/25/2016  . Lymphedema 12/25/2016  . Diabetes ([place]) 12/25/2016  . Essential hypertension 12/25/2016  . Hx of adenomatous colonic polyps 10/22/2016  . DOE (dyspnea on exertion) 07/22/2016  . CKD (chronic kidney disease) stage 3, GFR 30-59 ml/min 06/23/2016  . Swelling of limb 12/31/2015  . Severe obesity (BMI >= 40) ([place]) 12/31/2015  . Varicose veins of both legs with edema 12/31/2015  . VV (varicose veins) 11/27/2015  . Other iron deficiency anemia 10/08/2015  . Anemia due to stage 3 chronic kidney disease 10/08/2015  . BMI 45.0-49.9, adult ([place]) 10/05/2015  . OSA on CPAP 03/10/2015  . Lumbar stenosis with neurogenic claudication 09/30/2011   Andrey Spearman, MS, OTR/L, [facility] 08/31/19 1:46 PM   [place] MAIN [facility] SERVICES [address] [place], [place], [address] Phone: [phone]   Fax:  [phone]  Name: Sara Bright MRN: [id number] Date of Birth: 1940/10/25

## 2019-09-04 ENCOUNTER — Other Ambulatory Visit: Payer: Self-pay

## 2019-09-04 ENCOUNTER — Ambulatory Visit: Payer: Medicare HMO | Admitting: Occupational Therapy

## 2019-09-04 DIAGNOSIS — I890 Lymphedema, not elsewhere classified: Secondary | ICD-10-CM

## 2019-09-04 NOTE — Therapy (Signed)
[place] MAIN [facility] SERVICES [address] [place], [place], [address] Phone: [phone]   Fax:  [phone]  Occupational Therapy Treatment  Patient Details  Name: Sara Bright MRN: [id number] Date of Birth: 1940/10/06 Referring Provider (OT): Eulogio Ditch, [place]   Encounter Date: 09/04/2019   OT End of Session - 09/04/19 1247    Visit Number 21    Number of Visits 36    Date for OT Re-Evaluation 09/03/19    OT Start Time 1002    OT Stop Time 1104    OT Time Calculation (min) 62 min    Activity Tolerance Patient tolerated treatment well;No increased pain    Behavior During Therapy WFL for tasks assessed/performed           Past Medical History:  Diagnosis Date  . Adenoma of colon   . Adenomatous colon polyp   . Anemia    IDA  . Cataract   . Cervical stenosis of spinal canal   . Chronic kidney disease    stage 3  . Diabetes mellitus without complication ([place])   . GERD (gastroesophageal reflux disease)   . Glaucoma   . Heme positive stool   . History of UTI   . Hx of gallstones   . Hyperkalemia   . Hyperlipidemia   . Hyperlipidemia   . Hypertension   . Lumbar stenosis   . Lymphedema   . Neuromuscular disorder ([place])   . Neuropathy associated with endocrine disorder ([place])   . Obesity   . OSA on CPAP    on CPAP  . Osteoarthritis   . Osteoarthritis   . Port-A-Cath in place   . Retinopathy due to secondary diabetes ([place])   . Secondary hyperparathyroidism of renal origin [facility])     Past Surgical History:  Procedure Laterality Date  . ACDF X2    . BACK SURGERY    . Cataract extraction right    . catarect extraction left    . CHOLECYSTECTOMY    . COLONOSCOPY WITH PROPOFOL N/A 12/02/2016   Procedure: COLONOSCOPY WITH PROPOFOL;  Surgeon: Manya Silvas, MD;  Location: [facility] ENDOSCOPY;  Service: Endoscopy;  Laterality: N/A;  . COLONOSCOPY WITH PROPOFOL N/A 11/10/2017   Procedure: COLONOSCOPY WITH PROPOFOL;  Surgeon:  Toledo, Benay Pike, MD;  Location: [facility] ENDOSCOPY;  Service: Gastroenterology;  Laterality: N/A;  . colonoscopy with removal lesions by snare    . Endoscoic carpal tunnel release    . ESOPHAGOGASTRODUODENOSCOPY N/A 11/10/2017   Procedure: ESOPHAGOGASTRODUODENOSCOPY (EGD);  Surgeon: Toledo, Benay Pike, MD;  Location: [facility] ENDOSCOPY;  Service: Gastroenterology;  Laterality: N/A;  . ESOPHAGOGASTRODUODENOSCOPY (EGD) WITH PROPOFOL N/A 12/02/2016   Procedure: ESOPHAGOGASTRODUODENOSCOPY (EGD) WITH PROPOFOL;  Surgeon: Manya Silvas, MD;  Location: [facility] ENDOSCOPY;  Service: Endoscopy;  Laterality: N/A;  . IR IMAGING GUIDED PORT INSERTION  10/01/2017  . Laminectomy posterior lumbar facetectomy and formaninotomy w/Decomp    . Laminectony posterior cervicle decomp w/Facectomy and foraminotomy    . VEIN LIGATION AND STRIPPING Left     There were no vitals filed for this visit.   Subjective Assessment - 09/04/19 1005    Subjective  Mrs Cristiano presents for OT visit 21/36 to address BLE lymphedema. Pt presents with compression wraps in place below the knees bilaterally. Pt denies leg pain this morning. Pt has no new complaints or concerns.    Patient is accompanied by: Family member    Pertinent History Chronic leg swelling and associated pain > 20  years; , Hx recurrent  ulcers, hx of lymphorrhea, Contributing factors include CKD, stage 3, HTN, Obesity, OSA (has cpap), OA. Other pertinent medical hx include B neuropathy hands and feet, hx vein ligation and stripping s/p 5-6 yrs, cervicle and lumbar laminectomies, glaucoma and diabetic retinopathy    Limitations BLE weakness, impaired balance, impaired sensation hands and feet, impaired vision, increased falls risk, decreased hip AROM, claudication    Repetition Increases Symptoms    Special Tests +Stemmer base of toes bilaterally    Patient Stated Goals Want to stop these leg ulcers from returning, Get leg swelling better under control and keep symptoms from  getting worse.    Pain Onset Other (comment)   >20 YEARS                       OT Treatments/Exercises (OP) - 09/04/19 0001      ADLs   ADL Education Given Yes (P)       Manual Therapy   Manual Therapy Edema management (P)     Manual Lymphatic Drainage (MLD) MLD to RLE as established (P)     Compression Bandaging Applied gradient compression wraps from foot to popliteal bilaterally reducing from 3 to 2 short stretch wraps over single layer of Rosidal foam.  (P)                   OT Education - 09/04/19 1247    Education Details Continued skilled Pt/caregiver education  And LE ADL training throughout visit for lymphedema self care/ home program, including compression wrapping, compression garment and device wear/care, lymphatic pumping ther ex, simple self-MLD, and skin care. Discussed progress towards goals.    Person(s) Educated Patient;Child(ren)    Methods Explanation;Demonstration;Handout    Comprehension Verbalized understanding;Returned demonstration;Need further instruction               OT Long Term Goals - 07/17/19 1610      OT LONG TERM GOAL #1   Title Pt will be able to apply BLE, knee length, multi-layer, short stretch compression wraps daily to one leg at a time using correct gradient techniques with MAXIMUM CAREGIVER ASSISTANCE   to achieve optimal limb volume reduction, to return affected limb/s, as closely as possible, to premorbid size and shape, to limit infection risk, and to improve safe functional ambulation and mobility.    Baseline Dependent    Time 4    Period Days    Status Achieved      OT LONG TERM GOAL #2   Title Pt will be able to verbalize signs and symptoms of cellulitis infection and identify at least 4 common lymphedema precautions using printed resource for reference to limit LE progression over time to limit risk of infection and LE exacerbation.    Baseline Max A    Time 4    Period Days    Status Achieved      OT  LONG TERM GOAL #3   Title Pt will tolerate knee length,  multilayer short stretch compression wrap to single leg up to 23/ 7 to achieve optimal swelling reduction to arrest lymphedema progression and in preparation for fitting appropriate compression garments/ devices.    Baseline Max A    Time 6    Period Days    Status Achieved      OT LONG TERM GOAL #4   Title With max CG support daily for compression wrapping  Pt will achieve and sustain at least  85%  compliance with daily LE self-care home program (skin care, lymphatic pumping therex, compression and simple self-MLD) to reduce and control limb swelling, reduce infection risk and limit LE progression.    Baseline Dependent    Time 12    Period Weeks    Status On-going      OT LONG TERM GOAL #5   Title Pt will achieve at least 10% limb volume reduction in RLE and 5% reduction in LLE belowthe knees to reduce recurrent leg ulcers, reduce infection risk and limit LE progression.   Good progress. 5.53% reduction measured 07/17/19   Baseline dependent    Time 12    Period Weeks    Status Partially Met                 Plan - 09/04/19 1248    Clinical Impression Statement Pt tolerated MLD and compression wrapping to RLE without difficulty today. Limb volumes and skin condition is well managed between visits. Reinterated purpose of Flexitouch device as means to optimally sustain clinical gains when Pt is in the self-management phase of CDT and encouraged Pt to use Flexitouch more than 1 x weekly, to to begin habituation. Pt tells me she is still having some trouble getting in and out of the Flexi garments on her own without her daughter's help. OT to call manufacturer's rep to request follow along teaching with patient. Cont as per POC.    OT Occupational Profile and History Comprehensive Assessment- Review of records and extensive additional review of physical, cognitive, psychosocial history related to current functional performance     Occupational performance deficits (Please refer to evaluation for details): ADL's;Leisure;Social Participation;IADL's;Work    Body Structure / Function / Physical Skills ADL;Decreased knowledge of precautions;Flexibility;ROM;Mobility;Decreased knowledge of use of DME;Balance;Skin integrity;Wound;Edema;Pain;IADL    Rehab Potential Good    Clinical Decision Making Several treatment options, min-mod task modification necessary    Comorbidities Affecting Occupational Performance: Presence of comorbidities impacting occupational performance    Comorbidities impacting occupational performance description: see SUBJECTIVE for contibuting comorbidities    Modification or Assistance to Complete Evaluation  Max significant modification of tasks or assist is necessary to complete    OT Frequency 2x / week   2-3 x weekly x 12 weeks and prn   OT Duration 12 weeks   and PRN   OT Treatment/Interventions Self-care/ADL training;Therapeutic exercise;Manual lymph drainage;Compression bandaging;Patient/family education;Other (comment);Therapeutic activities;Manual Therapy;DME and/or AE instruction   SKIN CARE   Plan CDT: MLD, gradient wraps, ther ex, skin care,    OT Home Exercise Plan Caregiver to apply compression wraps daily during visit intervals and assist with donning / doffing compression consistently    Recommended Other Services Consider Velcro-style, adjustable , knee length Jobst, CircAid cmpression wraps as an alternative to traditional elastic stockings for ease of donning    Consulted and Agree with Plan of Care Patient           Patient will benefit from skilled therapeutic intervention in order to improve the following deficits and impairments:   Body Structure / Function / Physical Skills: ADL, Decreased knowledge of precautions, Flexibility, ROM, Mobility, Decreased knowledge of use of DME, Balance, Skin integrity, Wound, Edema, Pain, IADL       Visit Diagnosis: Lymphedema, not elsewhere  classified    Problem List Patient Active Problem List   Diagnosis Date Noted  . GERD (gastroesophageal reflux disease) 01/20/2019  . Hyperlipidemia 01/20/2019  . Claudication ([place]) 01/07/2019  . Venous ulcer of left  leg ([place]) 08/13/2018  . Diabetic polyneuropathy associated with type 2 diabetes mellitus ([place]) 05/30/2018  . Macrocytosis 04/26/2018  . Fecal occult blood test positive 10/08/2017  . IDA (iron deficiency anemia) 10/08/2017  . Chronic venous insufficiency 12/25/2016  . Lymphedema 12/25/2016  . Diabetes ([place]) 12/25/2016  . Essential hypertension 12/25/2016  . Hx of adenomatous colonic polyps 10/22/2016  . DOE (dyspnea on exertion) 07/22/2016  . CKD (chronic kidney disease) stage 3, GFR 30-59 ml/min 06/23/2016  . Swelling of limb 12/31/2015  . Severe obesity (BMI >= 40) ([place]) 12/31/2015  . Varicose veins of both legs with edema 12/31/2015  . VV (varicose veins) 11/27/2015  . Other iron deficiency anemia 10/08/2015  . Anemia due to stage 3 chronic kidney disease 10/08/2015  . BMI 45.0-49.9, adult ([place]) 10/05/2015  . OSA on CPAP 03/10/2015  . Lumbar stenosis with neurogenic claudication 09/30/2011   Andrey Spearman, MS, OTR/L, [facility] 09/04/19 12:54 PM  [address] MAIN [facility] SERVICES [address] [place], [place], [address] Phone: [phone]   Fax:  [phone]  Name: RUNETTE SCIFRES MRN: [id number] Date of Birth: 06-02-1940

## 2019-09-05 ENCOUNTER — Ambulatory Visit: Payer: Medicare HMO | Admitting: Occupational Therapy

## 2019-09-07 ENCOUNTER — Other Ambulatory Visit: Payer: Self-pay

## 2019-09-07 ENCOUNTER — Ambulatory Visit: Payer: Medicare HMO | Attending: Nurse Practitioner | Admitting: Occupational Therapy

## 2019-09-07 DIAGNOSIS — I890 Lymphedema, not elsewhere classified: Secondary | ICD-10-CM | POA: Insufficient documentation

## 2019-09-07 NOTE — Therapy (Signed)
[place] MAIN [facility] SERVICES [address] [place], [place], [address] Phone: [phone]   Fax:  [phone]  Occupational Therapy Treatment  Patient Details  Name: Sara Bright MRN: [id number] Date of Birth: 1940-11-06 Referring Provider (OT): Eulogio Ditch, [place]   Encounter Date: 09/07/2019   OT End of Session - 09/07/19 1256    Visit Number 22    Number of Visits 36    Date for OT Re-Evaluation 09/03/19    OT Start Time 1003    OT Stop Time 1103    OT Time Calculation (min) 60 min    Activity Tolerance Patient tolerated treatment well;No increased pain    Behavior During Therapy WFL for tasks assessed/performed           Past Medical History:  Diagnosis Date  . Adenoma of colon   . Adenomatous colon polyp   . Anemia    IDA  . Cataract   . Cervical stenosis of spinal canal   . Chronic kidney disease    stage 3  . Diabetes mellitus without complication ([place])   . GERD (gastroesophageal reflux disease)   . Glaucoma   . Heme positive stool   . History of UTI   . Hx of gallstones   . Hyperkalemia   . Hyperlipidemia   . Hyperlipidemia   . Hypertension   . Lumbar stenosis   . Lymphedema   . Neuromuscular disorder ([place])   . Neuropathy associated with endocrine disorder ([place])   . Obesity   . OSA on CPAP    on CPAP  . Osteoarthritis   . Osteoarthritis   . Port-A-Cath in place   . Retinopathy due to secondary diabetes ([place])   . Secondary hyperparathyroidism of renal origin [facility])     Past Surgical History:  Procedure Laterality Date  . ACDF X2    . BACK SURGERY    . Cataract extraction right    . catarect extraction left    . CHOLECYSTECTOMY    . COLONOSCOPY WITH PROPOFOL N/A 12/02/2016   Procedure: COLONOSCOPY WITH PROPOFOL;  Surgeon: Manya Silvas, MD;  Location: [facility] ENDOSCOPY;  Service: Endoscopy;  Laterality: N/A;  . COLONOSCOPY WITH PROPOFOL N/A 11/10/2017   Procedure: COLONOSCOPY WITH PROPOFOL;  Surgeon:  Toledo, Benay Pike, MD;  Location: [facility] ENDOSCOPY;  Service: Gastroenterology;  Laterality: N/A;  . colonoscopy with removal lesions by snare    . Endoscoic carpal tunnel release    . ESOPHAGOGASTRODUODENOSCOPY N/A 11/10/2017   Procedure: ESOPHAGOGASTRODUODENOSCOPY (EGD);  Surgeon: Toledo, Benay Pike, MD;  Location: [facility] ENDOSCOPY;  Service: Gastroenterology;  Laterality: N/A;  . ESOPHAGOGASTRODUODENOSCOPY (EGD) WITH PROPOFOL N/A 12/02/2016   Procedure: ESOPHAGOGASTRODUODENOSCOPY (EGD) WITH PROPOFOL;  Surgeon: Manya Silvas, MD;  Location[facility] ENDOSCOPY;  Service: Endoscopy;  Laterality: N/A;  . IR IMAGING GUIDED PORT INSERTION  10/01/2017  . Laminectomy posterior lumbar facetectomy and formaninotomy w/Decomp    . Laminectony posterior cervicle decomp w/Facectomy and foraminotomy    . VEIN LIGATION AND STRIPPING Left     There were no vitals filed for this visit.   Subjective Assessment - 09/07/19 1256    Subjective  Mrs Perrier presents for OT visit 22/36 to address BLE lymphedema. Pt presents with compression wraps in place below the knees bilaterally. Pt denies leg pain this morning.    Patient is accompanied by: Family member    Pertinent History Chronic leg swelling and associated pain > 20 years; , Hx recurrent  ulcers, hx  of lymphorrhea, Contributing factors include CKD, stage 3, HTN, Obesity, OSA (has cpap), OA. Other pertinent medical hx include B neuropathy hands and feet, hx vein ligation and stripping s/p 5-6 yrs, cervicle and lumbar laminectomies, glaucoma and diabetic retinopathy    Limitations BLE weakness, impaired balance, impaired sensation hands and feet, impaired vision, increased falls risk, decreased hip AROM, claudication    Repetition Increases Symptoms    Special Tests +Stemmer base of toes bilaterally    Patient Stated Goals Want to stop these leg ulcers from returning, Get leg swelling better under control and keep symptoms from getting worse.    Pain Onset Other  (comment)   >20 YEARS                       OT Treatments/Exercises (OP) - 09/07/19 0001      ADLs   ADL Education Given Yes      Manual Therapy   Manual Therapy Edema management;Compression Bandaging;Manual Lymphatic Drainage (MLD)    Manual Lymphatic Drainage (MLD) MLD to RLE as established    Compression Bandaging Applied gradient compression wraps from foot to popliteal bilaterally reducing from 3 to 2 short stretch wraps over single layer of Rosidal foam.                   OT Education - 09/07/19 1258    Education Details Continued skilled Pt/caregiver education  And LE ADL training throughout visit for lymphedema self care/ home program, including compression wrapping, compression garment and device wear/care, lymphatic pumping ther ex, simple self-MLD, and skin care. Discussed progress towards goals.    Person(s) Educated Patient;Child(ren)    Methods Explanation;Demonstration;Handout    Comprehension Verbalized understanding;Returned demonstration;Need further instruction               OT Long Term Goals - 07/17/19 1610      OT LONG TERM GOAL #1   Title Pt will be able to apply BLE, knee length, multi-layer, short stretch compression wraps daily to one leg at a time using correct gradient techniques with MAXIMUM CAREGIVER ASSISTANCE   to achieve optimal limb volume reduction, to return affected limb/s, as closely as possible, to premorbid size and shape, to limit infection risk, and to improve safe functional ambulation and mobility.    Baseline Dependent    Time 4    Period Days    Status Achieved      OT LONG TERM GOAL #2   Title Pt will be able to verbalize signs and symptoms of cellulitis infection and identify at least 4 common lymphedema precautions using printed resource for reference to limit LE progression over time to limit risk of infection and LE exacerbation.    Baseline Max A    Time 4    Period Days    Status Achieved      OT  LONG TERM GOAL #3   Title Pt will tolerate knee length,  multilayer short stretch compression wrap to single leg up to 23/ 7 to achieve optimal swelling reduction to arrest lymphedema progression and in preparation for fitting appropriate compression garments/ devices.    Baseline Max A    Time 6    Period Days    Status Achieved      OT LONG TERM GOAL #4   Title With max CG support daily for compression wrapping  Pt will achieve and sustain at least  85%  compliance with daily LE self-care home program (skin care,  lymphatic pumping therex, compression and simple self-MLD) to reduce and control limb swelling, reduce infection risk and limit LE progression.    Baseline Dependent    Time 12    Period Weeks    Status On-going      OT LONG TERM GOAL #5   Title Pt will achieve at least 10% limb volume reduction in RLE and 5% reduction in LLE belowthe knees to reduce recurrent leg ulcers, reduce infection risk and limit LE progression.   Good progress. 5.53% reduction measured 07/17/19   Baseline dependent    Time 12    Period Weeks    Status Partially Met                 Plan - 09/07/19 1259    Clinical Impression Statement Pt now able to lift legs onto treatment bed with a little extra time, or occasionally with leg lifter  (modified independent) . INitially she needed Max A to lift legs to treatment table. Pt now able to close R fist and she demonstrates significant increase in grip strenth since commencing resistive ther ex. Pt tolerated MLD and compression wraps without increased pain today. Excellent progress towards goals ongoing.    OT Occupational Profile and History Comprehensive Assessment- Review of records and extensive additional review of physical, cognitive, psychosocial history related to current functional performance    Occupational performance deficits (Please refer to evaluation for details): ADL's;Leisure;Social Participation;IADL's;Work    Body Structure / Function /  Physical Skills ADL;Decreased knowledge of precautions;Flexibility;ROM;Mobility;Decreased knowledge of use of DME;Balance;Skin integrity;Wound;Edema;Pain;IADL    Rehab Potential Good    Clinical Decision Making Several treatment options, min-mod task modification necessary    Comorbidities Affecting Occupational Performance: Presence of comorbidities impacting occupational performance    Comorbidities impacting occupational performance description: see SUBJECTIVE for contibuting comorbidities    Modification or Assistance to Complete Evaluation  Max significant modification of tasks or assist is necessary to complete    OT Frequency 2x / week   2-3 x weekly x 12 weeks and prn   OT Duration 12 weeks   and PRN   OT Treatment/Interventions Self-care/ADL training;Therapeutic exercise;Manual lymph drainage;Compression bandaging;Patient/family education;Other (comment);Therapeutic activities;Manual Therapy;DME and/or AE instruction   SKIN CARE   Plan CDT: MLD, gradient wraps, ther ex, skin care,    OT Home Exercise Plan Caregiver to apply compression wraps daily during visit intervals and assist with donning / doffing compression consistently    Recommended Other Services Consider Velcro-style, adjustable , knee length Jobst, CircAid cmpression wraps as an alternative to traditional elastic stockings for ease of donning    Consulted and Agree with Plan of Care Patient           Patient will benefit from skilled therapeutic intervention in order to improve the following deficits and impairments:   Body Structure / Function / Physical Skills: ADL, Decreased knowledge of precautions, Flexibility, ROM, Mobility, Decreased knowledge of use of DME, Balance, Skin integrity, Wound, Edema, Pain, IADL       Visit Diagnosis: Lymphedema, not elsewhere classified    Problem List Patient Active Problem List   Diagnosis Date Noted  . GERD (gastroesophageal reflux disease) 01/20/2019  . Hyperlipidemia  01/20/2019  . Claudication ([place]) 01/07/2019  . Venous ulcer of left leg ([place]) 08/13/2018  . Diabetic polyneuropathy associated with type 2 diabetes mellitus ([place]) 05/30/2018  . Macrocytosis 04/26/2018  . Fecal occult blood test positive 10/08/2017  . IDA (iron deficiency anemia) 10/08/2017  . Chronic venous insufficiency  12/25/2016  . Lymphedema 12/25/2016  . Diabetes ([place]) 12/25/2016  . Essential hypertension 12/25/2016  . Hx of adenomatous colonic polyps 10/22/2016  . DOE (dyspnea on exertion) 07/22/2016  . CKD (chronic kidney disease) stage 3, GFR 30-59 ml/min 06/23/2016  . Swelling of limb 12/31/2015  . Severe obesity (BMI >= 40) ([place]) 12/31/2015  . Varicose veins of both legs with edema 12/31/2015  . VV (varicose veins) 11/27/2015  . Other iron deficiency anemia 10/08/2015  . Anemia due to stage 3 chronic kidney disease 10/08/2015  . BMI 45.0-49.9, adult ([place]) 10/05/2015  . OSA on CPAP 03/10/2015  . Lumbar stenosis with neurogenic claudication 09/30/2011    Andrey Spearman, MS, OTR/L, [facility] 09/07/19 1:02 PM  [place] MAIN [facility] SERVICES [address] [place], [place], [address] Phone: [phone]   Fax:  [phone]  Name: SHANTARA GOOSBY MRN: [id number] Date of Birth: [birth year]

## 2019-09-12 ENCOUNTER — Inpatient Hospital Stay: Payer: Medicare HMO | Attending: Hematology and Oncology

## 2019-09-12 ENCOUNTER — Other Ambulatory Visit: Payer: Self-pay

## 2019-09-12 ENCOUNTER — Inpatient Hospital Stay: Payer: Medicare HMO

## 2019-09-12 VITALS — BP 145/65 | HR 59 | Temp 98.0°F | Resp 18

## 2019-09-12 DIAGNOSIS — Z8249 Family history of ischemic heart disease and other diseases of the circulatory system: Secondary | ICD-10-CM | POA: Diagnosis not present

## 2019-09-12 DIAGNOSIS — R0602 Shortness of breath: Secondary | ICD-10-CM | POA: Diagnosis not present

## 2019-09-12 DIAGNOSIS — Z833 Family history of diabetes mellitus: Secondary | ICD-10-CM | POA: Insufficient documentation

## 2019-09-12 DIAGNOSIS — Z811 Family history of alcohol abuse and dependence: Secondary | ICD-10-CM | POA: Diagnosis not present

## 2019-09-12 DIAGNOSIS — N1832 Chronic kidney disease, stage 3b: Secondary | ICD-10-CM | POA: Diagnosis present

## 2019-09-12 DIAGNOSIS — N183 Chronic kidney disease, stage 3 unspecified: Secondary | ICD-10-CM

## 2019-09-12 DIAGNOSIS — Z8051 Family history of malignant neoplasm of kidney: Secondary | ICD-10-CM | POA: Insufficient documentation

## 2019-09-12 DIAGNOSIS — D631 Anemia in chronic kidney disease: Secondary | ICD-10-CM | POA: Diagnosis present

## 2019-09-12 DIAGNOSIS — Z79899 Other long term (current) drug therapy: Secondary | ICD-10-CM | POA: Diagnosis not present

## 2019-09-12 DIAGNOSIS — Z841 Family history of disorders of kidney and ureter: Secondary | ICD-10-CM | POA: Diagnosis not present

## 2019-09-12 DIAGNOSIS — D7589 Other specified diseases of blood and blood-forming organs: Secondary | ICD-10-CM | POA: Insufficient documentation

## 2019-09-12 DIAGNOSIS — Z823 Family history of stroke: Secondary | ICD-10-CM | POA: Diagnosis not present

## 2019-09-12 DIAGNOSIS — M7989 Other specified soft tissue disorders: Secondary | ICD-10-CM | POA: Insufficient documentation

## 2019-09-12 DIAGNOSIS — I129 Hypertensive chronic kidney disease with stage 1 through stage 4 chronic kidney disease, or unspecified chronic kidney disease: Secondary | ICD-10-CM | POA: Diagnosis not present

## 2019-09-12 DIAGNOSIS — Z836 Family history of other diseases of the respiratory system: Secondary | ICD-10-CM | POA: Insufficient documentation

## 2019-09-12 LAB — HEMOGLOBIN AND HEMATOCRIT, BLOOD
HCT: 29.6 % — ABNORMAL LOW (ref 36.0–46.0)
Hemoglobin: 9.5 g/dL — ABNORMAL LOW (ref 12.0–15.0)

## 2019-09-12 MED ORDER — EPOETIN ALFA-EPBX 10000 UNIT/ML IJ SOLN
10000.0000 [IU] | Freq: Once | INTRAMUSCULAR | Status: AC
  Administered 2019-09-12: 10000 [IU] via SUBCUTANEOUS

## 2019-09-13 ENCOUNTER — Ambulatory Visit: Payer: Medicare HMO | Admitting: Occupational Therapy

## 2019-09-13 DIAGNOSIS — I890 Lymphedema, not elsewhere classified: Secondary | ICD-10-CM | POA: Diagnosis not present

## 2019-09-13 NOTE — Therapy (Signed)
[place] MAIN [facility] SERVICES [address] [place], [place], [address] Phone: [phone]   Fax:  [phone]  Occupational Therapy Treatment  Patient Details  Name: Sara Bright MRN: [id number] Date of Birth: 1940-05-29 Referring Provider (OT): Eulogio Ditch, [place]   Encounter Date: 09/13/2019   OT End of Session - 09/13/19 1254    Visit Number 23    Number of Visits 36    Date for OT Re-Evaluation 12/12/19    OT Start Time 1110    OT Stop Time 1223    OT Time Calculation (min) 73 min    Activity Tolerance Patient tolerated treatment well;No increased pain    Behavior During Therapy WFL for tasks assessed/performed           Past Medical History:  Diagnosis Date  . Adenoma of colon   . Adenomatous colon polyp   . Anemia    IDA  . Cataract   . Cervical stenosis of spinal canal   . Chronic kidney disease    stage 3  . Diabetes mellitus without complication ([place])   . GERD (gastroesophageal reflux disease)   . Glaucoma   . Heme positive stool   . History of UTI   . Hx of gallstones   . Hyperkalemia   . Hyperlipidemia   . Hyperlipidemia   . Hypertension   . Lumbar stenosis   . Lymphedema   . Neuromuscular disorder ([place])   . Neuropathy associated with endocrine disorder ([place])   . Obesity   . OSA on CPAP    on CPAP  . Osteoarthritis   . Osteoarthritis   . Port-A-Cath in place   . Retinopathy due to secondary diabetes ([place])   . Secondary hyperparathyroidism of renal origin [facility])     Past Surgical History:  Procedure Laterality Date  . ACDF X2    . BACK SURGERY    . Cataract extraction right    . catarect extraction left    . CHOLECYSTECTOMY    . COLONOSCOPY WITH PROPOFOL N/A 12/02/2016   Procedure: COLONOSCOPY WITH PROPOFOL;  Surgeon: Manya Silvas, MD;  Location: [facility] ENDOSCOPY;  Service: Endoscopy;  Laterality: N/A;  . COLONOSCOPY WITH PROPOFOL N/A 11/10/2017   Procedure: COLONOSCOPY WITH PROPOFOL;  Surgeon:  Toledo, Benay Pike, MD;  Location: [facility] ENDOSCOPY;  Service: Gastroenterology;  Laterality: N/A;  . colonoscopy with removal lesions by snare    . Endoscoic carpal tunnel release    . ESOPHAGOGASTRODUODENOSCOPY N/A 11/10/2017   Procedure: ESOPHAGOGASTRODUODENOSCOPY (EGD);  Surgeon: Toledo, Benay Pike, MD;  Location: [facility] ENDOSCOPY;  Service: Gastroenterology;  Laterality: N/A;  . ESOPHAGOGASTRODUODENOSCOPY (EGD) WITH PROPOFOL N/A 12/02/2016   Procedure: ESOPHAGOGASTRODUODENOSCOPY (EGD) WITH PROPOFOL;  Surgeon: Manya Silvas, MD;  Location: [facility] ENDOSCOPY;  Service: Endoscopy;  Laterality: N/A;  . IR IMAGING GUIDED PORT INSERTION  10/01/2017  . Laminectomy posterior lumbar facetectomy and formaninotomy w/Decomp    . Laminectony posterior cervicle decomp w/Facectomy and foraminotomy    . VEIN LIGATION AND STRIPPING Left     There were no vitals filed for this visit.   Subjective Assessment - 09/13/19 1113    Subjective  Sara Bright presents for OT visit 23/36 to address BLE lymphedema. Pt presents with compression wraps in place below the knees bilaterally. Pt denies leg pain again today.    Patient is accompanied by: Family member    Pertinent History Chronic leg swelling and associated pain > 20 years; , Hx recurrent  ulcers, hx  of lymphorrhea, Contributing factors include CKD, stage 3, HTN, Obesity, OSA (has cpap), OA. Other pertinent medical hx include B neuropathy hands and feet, hx vein ligation and stripping s/p 5-6 yrs, cervicle and lumbar laminectomies, glaucoma and diabetic retinopathy    Limitations BLE weakness, impaired balance, impaired sensation hands and feet, impaired vision, increased falls risk, decreased hip AROM, claudication    Repetition Increases Symptoms    Special Tests +Stemmer base of toes bilaterally    Patient Stated Goals Want to stop these leg ulcers from returning, Get leg swelling better under control and keep symptoms from getting worse.    Pain Onset Other  (comment)   >20 YEARS                       OT Treatments/Exercises (OP) - 09/13/19 0001      ADLs   ADL Education Given Yes      Manual Therapy   Manual Therapy Edema management;Compression Bandaging;Manual Lymphatic Drainage (MLD)    Manual Lymphatic Drainage (MLD) MLD to BLE as established. Fibrosis techniques to dense fibrosis at L leg b and ankle.     Compression Bandaging Applied gradient compression wraps from foot to popliteal bilaterally reducing from 3 to 2 short stretch wraps over single layer of Rosidal foam.                   OT Education - 09/13/19 1252    Education Details Continued skilled Pt/caregiver education  And LE ADL training throughout visit for lymphedema self care/ home program, including compression wrapping, compression garment and device wear/care, lymphatic pumping ther ex, simple self-MLD, and skin care. Discussed progress towards goals.    Person(s) Educated Patient;Child(ren)    Methods Explanation;Demonstration;Handout    Comprehension Verbalized understanding;Returned demonstration;Need further instruction               OT Long Term Goals - 09/13/19 1259      OT LONG TERM GOAL #1   Title Pt will be able to apply BLE, knee length, multi-layer, short stretch compression wraps daily to one leg at a time using correct gradient techniques with MAXIMUM CAREGIVER ASSISTANCE   to achieve optimal limb volume reduction, to return affected limb/s, as closely as possible, to premorbid size and shape, to limit infection risk, and to improve safe functional ambulation and mobility.    Baseline Dependent    Time 4    Period Days    Status Achieved      OT LONG TERM GOAL #2   Title Pt will be able to verbalize signs and symptoms of cellulitis infection and identify at least 4 common lymphedema precautions using printed resource for reference to limit LE progression over time to limit risk of infection and LE exacerbation.    Baseline  Max A    Time 4    Period Days    Status Achieved      OT LONG TERM GOAL #3   Title Pt will tolerate knee length,  multilayer short stretch compression wrap to single leg up to 23/ 7 to achieve optimal swelling reduction to arrest lymphedema progression and in preparation for fitting appropriate compression garments/ devices.    Baseline Max A    Time 6    Period Days    Status Achieved      OT LONG TERM GOAL #4   Title With max CG support daily for compression wrapping  Pt will achieve and sustain at least  85%  compliance with daily LE self-care home program (skin care, lymphatic pumping therex, compression and simple self-MLD) to reduce and control limb swelling, reduce infection risk and limit LE progression.    Baseline Dependent    Time 12    Period Weeks    Status Achieved      OT LONG TERM GOAL #5   Title Pt will achieve at least 10% limb volume reduction in RLE and 5% reduction in LLE belowthe knees to reduce recurrent leg ulcers, reduce infection risk and limit LE progression.   Good progress. 5.53% reduction measured 07/17/19   Baseline dependent    Time 12    Period Weeks    Status Partially Met                 Plan - 09/13/19 1255    Clinical Impression Statement Pt encouraged to resume resistive theraband ther ex    for BLE strenthening while continuing to use hand strengthening putty and squeeze ball as both hand and LE stregth are needed to don and doff compression garments. Pt tolerated MLD bilaterally today. Compression wraps applied as established. Compression garments are expected to arrive any day. Cont as per POC.    OT Occupational Profile and History Comprehensive Assessment- Review of records and extensive additional review of physical, cognitive, psychosocial history related to current functional performance    Occupational performance deficits (Please refer to evaluation for details): ADL's;Leisure;Social Participation;IADL's;Work    Body Structure /  Function / Physical Skills ADL;Decreased knowledge of precautions;Flexibility;ROM;Mobility;Decreased knowledge of use of DME;Balance;Skin integrity;Wound;Edema;Pain;IADL    Rehab Potential Good    Clinical Decision Making Several treatment options, min-mod task modification necessary    Comorbidities Affecting Occupational Performance: Presence of comorbidities impacting occupational performance    Comorbidities impacting occupational performance description: see SUBJECTIVE for contibuting comorbidities    Modification or Assistance to Complete Evaluation  Max significant modification of tasks or assist is necessary to complete    OT Frequency 2x / week   2-3 x weekly x 12 weeks and prn   OT Duration 12 weeks   and PRN   OT Treatment/Interventions Self-care/ADL training;Therapeutic exercise;Manual lymph drainage;Compression bandaging;Patient/family education;Other (comment);Therapeutic activities;Manual Therapy;DME and/or AE instruction   SKIN CARE   Plan CDT: MLD, gradient wraps, ther ex, skin care,    OT Home Exercise Plan Caregiver to apply compression wraps daily during visit intervals and assist with donning / doffing compression consistently    Recommended Other Services Consider Velcro-style, adjustable , knee length Jobst, CircAid cmpression wraps as an alternative to traditional elastic stockings for ease of donning    Consulted and Agree with Plan of Care Patient           Patient will benefit from skilled therapeutic intervention in order to improve the following deficits and impairments:   Body Structure / Function / Physical Skills: ADL, Decreased knowledge of precautions, Flexibility, ROM, Mobility, Decreased knowledge of use of DME, Balance, Skin integrity, Wound, Edema, Pain, IADL       Visit Diagnosis: Lymphedema, not elsewhere classified - Plan: Ot plan of care cert/re-cert    Problem List Patient Active Problem List   Diagnosis Date Noted  . GERD (gastroesophageal  reflux disease) 01/20/2019  . Hyperlipidemia 01/20/2019  . Claudication ([place]) 01/07/2019  . Venous ulcer of left leg ([place]) 08/13/2018  . Diabetic polyneuropathy associated with type 2 diabetes mellitus ([place]) 05/30/2018  . Macrocytosis 04/26/2018  . Fecal occult blood test positive 10/08/2017  . IDA (  iron deficiency anemia) 10/08/2017  . Chronic venous insufficiency 12/25/2016  . Lymphedema 12/25/2016  . Diabetes ([place]) 12/25/2016  . Essential hypertension 12/25/2016  . Hx of adenomatous colonic polyps 10/22/2016  . DOE (dyspnea on exertion) 07/22/2016  . CKD (chronic kidney disease) stage 3, GFR 30-59 ml/min 06/23/2016  . Swelling of limb 12/31/2015  . Severe obesity (BMI >= 40) ([place]) 12/31/2015  . Varicose veins of both legs with edema 12/31/2015  . VV (varicose veins) 11/27/2015  . Other iron deficiency anemia 10/08/2015  . Anemia due to stage 3 chronic kidney disease 10/08/2015  . BMI 45.0-49.9, adult ([place]) 10/05/2015  . OSA on CPAP 03/10/2015  . Lumbar stenosis with neurogenic claudication 09/30/2011    Andrey Spearman, MS, OTR/L, [facility] 09/13/19 1:02 PM   [place] MAIN [facility] SERVICES [address] [place], [place], [address] Phone: [phone]   Fax:  [phone]  Name: LAQUANA VILLARI MRN: [id number] Date of Birth: 1940-04-19

## 2019-09-21 ENCOUNTER — Ambulatory Visit: Payer: Medicare HMO | Admitting: Occupational Therapy

## 2019-09-21 ENCOUNTER — Other Ambulatory Visit: Payer: Self-pay

## 2019-09-21 DIAGNOSIS — I890 Lymphedema, not elsewhere classified: Secondary | ICD-10-CM

## 2019-09-21 NOTE — Therapy (Signed)
[place] MAIN [facility] SERVICES [address] [place], [place], [address] Phone: [phone]   Fax:  [phone]  Occupational Therapy Treatment  Patient Details  Name: Sara Bright MRN: [id number] Date of Birth: 30-Mar-1940 Referring Provider (OT): Eulogio Ditch, [place]   Encounter Date: 09/21/2019   OT End of Session - 09/21/19 1251    Visit Number 24    Number of Visits 36    Date for OT Re-Evaluation 12/12/19    OT Start Time 1110    OT Stop Time 7858    OT Time Calculation (min) 72 min    Activity Tolerance Patient tolerated treatment well;No increased pain    Behavior During Therapy WFL for tasks assessed/performed           Past Medical History:  Diagnosis Date  . Adenoma of colon   . Adenomatous colon polyp   . Anemia    IDA  . Cataract   . Cervical stenosis of spinal canal   . Chronic kidney disease    stage 3  . Diabetes mellitus without complication ([place])   . GERD (gastroesophageal reflux disease)   . Glaucoma   . Heme positive stool   . History of UTI   . Hx of gallstones   . Hyperkalemia   . Hyperlipidemia   . Hyperlipidemia   . Hypertension   . Lumbar stenosis   . Lymphedema   . Neuromuscular disorder ([place])   . Neuropathy associated with endocrine disorder ([place])   . Obesity   . OSA on CPAP    on CPAP  . Osteoarthritis   . Osteoarthritis   . Port-A-Cath in place   . Retinopathy due to secondary diabetes ([place])   . Secondary hyperparathyroidism of renal origin [facility])     Past Surgical History:  Procedure Laterality Date  . ACDF X2    . BACK SURGERY    . Cataract extraction right    . catarect extraction left    . CHOLECYSTECTOMY    . COLONOSCOPY WITH PROPOFOL N/A 12/02/2016   Procedure: COLONOSCOPY WITH PROPOFOL;  Surgeon: Manya Silvas, MD;  Location: [facility] ENDOSCOPY;  Service: Endoscopy;  Laterality: N/A;  . COLONOSCOPY WITH PROPOFOL N/A 11/10/2017   Procedure: COLONOSCOPY WITH PROPOFOL;  Surgeon:  Toledo, Benay Pike, MD;  Location: [facility] ENDOSCOPY;  Service: Gastroenterology;  Laterality: N/A;  . colonoscopy with removal lesions by snare    . Endoscoic carpal tunnel release    . ESOPHAGOGASTRODUODENOSCOPY N/A 11/10/2017   Procedure: ESOPHAGOGASTRODUODENOSCOPY (EGD);  Surgeon: Toledo, Benay Pike, MD;  Location: [facility] ENDOSCOPY;  Service: Gastroenterology;  Laterality: N/A;  . ESOPHAGOGASTRODUODENOSCOPY (EGD) WITH PROPOFOL N/A 12/02/2016   Procedure: ESOPHAGOGASTRODUODENOSCOPY (EGD) WITH PROPOFOL;  Surgeon: Manya Silvas, MD;  Location: [facility] ENDOSCOPY;  Service: Endoscopy;  Laterality: N/A;  . IR IMAGING GUIDED PORT INSERTION  10/01/2017  . Laminectomy posterior lumbar facetectomy and formaninotomy w/Decomp    . Laminectony posterior cervicle decomp w/Facectomy and foraminotomy    . VEIN LIGATION AND STRIPPING Left     There were no vitals filed for this visit.   Subjective Assessment - 09/21/19 1247    Subjective  Sara Bright presents for OT visit 24/36 to address BLE lymphedema. Pt presents with compression wraps in place below the knees bilaterally. Pt denies leg pain again today.Wraps are sliding down from top edge bilaterally.    Pertinent History Chronic leg swelling and associated pain > 20 years; , Hx recurrent  ulcers, hx of lymphorrhea,  Contributing factors include CKD, stage 3, HTN, Obesity, OSA (has cpap), OA. Other pertinent medical hx include B neuropathy hands and feet, hx vein ligation and stripping s/p 5-6 yrs, cervicle and lumbar laminectomies, glaucoma and diabetic retinopathy    Limitations BLE weakness, impaired balance, impaired sensation hands and feet, impaired vision, increased falls risk, decreased hip AROM, claudication    Repetition Increases Symptoms    Special Tests +Stemmer base of toes bilaterally    Patient Stated Goals Want to stop these leg ulcers from returning, Get leg swelling better under control and keep symptoms from getting worse.    Pain Onset Other  (comment)   >20 YEARS                       OT Treatments/Exercises (OP) - 09/21/19 0001      ADLs   ADL Education Given Yes      Manual Therapy   Manual Therapy Edema management;Compression Bandaging;Manual Lymphatic Drainage (MLD)    Manual Lymphatic Drainage (MLD) MLD to RLE as established. Fibrosis techniques to dense fibrosis at L leg b and ankle.     Compression Bandaging Modified gradient wraps today by reducing from 3 bandages to 2, and added 1/2 of hospital ace wrap around tape at top 1/4 to attempt to limit  sliding down and bunching at ankles.                  OT Education - 09/21/19 1250    Education Details Continued skilled Pt/caregiver education  And LE ADL training throughout visit for lymphedema self care/ home program, including compression wrapping, compression garment and device wear/care, lymphatic pumping ther ex, simple self-MLD, and skin care. Discussed progress towards goals.    Person(s) Educated Patient;Child(ren)    Methods Explanation;Demonstration;Handout    Comprehension Verbalized understanding;Returned demonstration;Need further instruction               OT Long Term Goals - 09/13/19 1259      OT LONG TERM GOAL #1   Title Pt will be able to apply BLE, knee length, multi-layer, short stretch compression wraps daily to one leg at a time using correct gradient techniques with MAXIMUM CAREGIVER ASSISTANCE   to achieve optimal limb volume reduction, to return affected limb/s, as closely as possible, to premorbid size and shape, to limit infection risk, and to improve safe functional ambulation and mobility.    Baseline Dependent    Time 4    Period Days    Status Achieved      OT LONG TERM GOAL #2   Title Pt will be able to verbalize signs and symptoms of cellulitis infection and identify at least 4 common lymphedema precautions using printed resource for reference to limit LE progression over time to limit risk of infection and  LE exacerbation.    Baseline Max A    Time 4    Period Days    Status Achieved      OT LONG TERM GOAL #3   Title Pt will tolerate knee length,  multilayer short stretch compression wrap to single leg up to 23/ 7 to achieve optimal swelling reduction to arrest lymphedema progression and in preparation for fitting appropriate compression garments/ devices.    Baseline Max A    Time 6    Period Days    Status Achieved      OT LONG TERM GOAL #4   Title With max CG support daily for compression wrapping  Pt will achieve and sustain at least  85%  compliance with daily LE self-care home program (skin care, lymphatic pumping therex, compression and simple self-MLD) to reduce and control limb swelling, reduce infection risk and limit LE progression.    Baseline Dependent    Time 12    Period Weeks    Status Achieved      OT LONG TERM GOAL #5   Title Pt will achieve at least 10% limb volume reduction in RLE and 5% reduction in LLE belowthe knees to reduce recurrent leg ulcers, reduce infection risk and limit LE progression.   Good progress. 5.53% reduction measured 07/17/19   Baseline dependent    Time 12    Period Weeks    Status Partially Met                 Plan - 09/21/19 1251    Clinical Impression Statement Pt continues to diligenty perform hand strengthening ther ex, ( to enable ability to don/ doff compression garments) lower extremity resistive exercises ( strength and muscle pump) with theraband, and has recently added pedal exerciser ( muscle pump). Pt demonstrates significant improvement in functional grip and is now able to lift both legs onto treatment bed without assistive device or extra time. Pt tolerated MLD without pain today. Added short layer of ace wrap to top 1/4 of wraps in effort to limit sliding  and bunching distally. Cont as per POC.    OT Occupational Profile and History Comprehensive Assessment- Review of records and extensive additional review of physical,  cognitive, psychosocial history related to current functional performance    Occupational performance deficits (Please refer to evaluation for details): ADL's;Leisure;Social Participation;IADL's;Work    Body Structure / Function / Physical Skills ADL;Decreased knowledge of precautions;Flexibility;ROM;Mobility;Decreased knowledge of use of DME;Balance;Skin integrity;Wound;Edema;Pain;IADL    Rehab Potential Good    Clinical Decision Making Several treatment options, min-mod task modification necessary    Comorbidities Affecting Occupational Performance: Presence of comorbidities impacting occupational performance    Comorbidities impacting occupational performance description: see SUBJECTIVE for contibuting comorbidities    Modification or Assistance to Complete Evaluation  Max significant modification of tasks or assist is necessary to complete    OT Frequency 2x / week   2-3 x weekly x 12 weeks and prn   OT Duration 12 weeks   and PRN   OT Treatment/Interventions Self-care/ADL training;Therapeutic exercise;Manual lymph drainage;Compression bandaging;Patient/family education;Other (comment);Therapeutic activities;Manual Therapy;DME and/or AE instruction   SKIN CARE   Plan CDT: MLD, gradient wraps, ther ex, skin care,    OT Home Exercise Plan Caregiver to apply compression wraps daily during visit intervals and assist with donning / doffing compression consistently    Recommended Other Services Consider Velcro-style, adjustable , knee length Jobst, CircAid cmpression wraps as an alternative to traditional elastic stockings for ease of donning    Consulted and Agree with Plan of Care Patient           Patient will benefit from skilled therapeutic intervention in order to improve the following deficits and impairments:   Body Structure / Function / Physical Skills: ADL, Decreased knowledge of precautions, Flexibility, ROM, Mobility, Decreased knowledge of use of DME, Balance, Skin integrity, Wound,  Edema, Pain, IADL       Visit Diagnosis: Lymphedema, not elsewhere classified    Problem List Patient Active Problem List   Diagnosis Date Noted  . GERD (gastroesophageal reflux disease) 01/20/2019  . Hyperlipidemia 01/20/2019  . Claudication ([place]) 01/07/2019  . Venous  ulcer of left leg ([place]) 08/13/2018  . Diabetic polyneuropathy associated with type 2 diabetes mellitus ([place]) 05/30/2018  . Macrocytosis 04/26/2018  . Fecal occult blood test positive 10/08/2017  . IDA (iron deficiency anemia) 10/08/2017  . Chronic venous insufficiency 12/25/2016  . Lymphedema 12/25/2016  . Diabetes ([place]) 12/25/2016  . Essential hypertension 12/25/2016  . Hx of adenomatous colonic polyps 10/22/2016  . DOE (dyspnea on exertion) 07/22/2016  . CKD (chronic kidney disease) stage 3, GFR 30-59 ml/min 06/23/2016  . Swelling of limb 12/31/2015  . Severe obesity (BMI >= 40) ([place]) 12/31/2015  . Varicose veins of both legs with edema 12/31/2015  . VV (varicose veins) 11/27/2015  . Other iron deficiency anemia 10/08/2015  . Anemia due to stage 3 chronic kidney disease 10/08/2015  . BMI 45.0-49.9, adult ([place]) 10/05/2015  . OSA on CPAP 03/10/2015  . Lumbar stenosis with neurogenic claudication 09/30/2011    Andrey Spearman, MS, OTR/L, [facility] 09/21/19 1:00 PM  [place] MAIN [facility] SERVICES [address] [place], [place], [address] Phone: [phone]   Fax:  [phone]  Name: Sara Bright MRN: [id number] Date of Birth: [birth year]

## 2019-09-26 ENCOUNTER — Other Ambulatory Visit: Payer: Self-pay

## 2019-09-26 ENCOUNTER — Inpatient Hospital Stay: Payer: Medicare HMO

## 2019-09-26 ENCOUNTER — Ambulatory Visit: Payer: Medicare HMO | Admitting: Occupational Therapy

## 2019-09-26 ENCOUNTER — Encounter: Payer: Self-pay | Admitting: Occupational Therapy

## 2019-09-26 DIAGNOSIS — D631 Anemia in chronic kidney disease: Secondary | ICD-10-CM

## 2019-09-26 DIAGNOSIS — I129 Hypertensive chronic kidney disease with stage 1 through stage 4 chronic kidney disease, or unspecified chronic kidney disease: Secondary | ICD-10-CM | POA: Diagnosis not present

## 2019-09-26 DIAGNOSIS — N1832 Chronic kidney disease, stage 3b: Secondary | ICD-10-CM

## 2019-09-26 DIAGNOSIS — I890 Lymphedema, not elsewhere classified: Secondary | ICD-10-CM

## 2019-09-26 DIAGNOSIS — N183 Chronic kidney disease, stage 3 unspecified: Secondary | ICD-10-CM

## 2019-09-26 LAB — HEMOGLOBIN AND HEMATOCRIT, BLOOD
HCT: 30.1 % — ABNORMAL LOW (ref 36.0–46.0)
Hemoglobin: 9.3 g/dL — ABNORMAL LOW (ref 12.0–15.0)

## 2019-09-26 MED ORDER — EPOETIN ALFA-EPBX 10000 UNIT/ML IJ SOLN
10000.0000 [IU] | Freq: Once | INTRAMUSCULAR | Status: AC
  Administered 2019-09-26: 10000 [IU] via SUBCUTANEOUS

## 2019-09-26 NOTE — Therapy (Signed)
[place] MAIN [facility] SERVICES [address] [place], [place], [address] Phone: [phone]   Fax:  [phone]  Occupational Therapy Treatment  Patient Details  Name: Sara Bright MRN: [id number] Date of Birth: [birth year] Referring Provider (OT): Sara Bright, [place]   Encounter Date: 09/26/2019   OT End of Session - 09/26/19 1149    Visit Number 25    Number of Visits 36    Date for OT Re-Evaluation 12/12/19    OT Start Time 0910    OT Stop Time 1000    OT Time Calculation (min) 50 min    Equipment Utilized During Treatment friction gloves, Tyvek sock, Medi Lg donning butler frame    Activity Tolerance Patient tolerated treatment well;No increased pain    Behavior During Therapy WFL for tasks assessed/performed           Past Medical History:  Diagnosis Date  . Adenoma of colon   . Adenomatous colon polyp   . Anemia    IDA  . Cataract   . Cervical stenosis of spinal canal   . Chronic kidney disease    stage 3  . Diabetes mellitus without complication ([place])   . GERD (gastroesophageal reflux disease)   . Glaucoma   . Heme positive stool   . History of UTI   . Hx of gallstones   . Hyperkalemia   . Hyperlipidemia   . Hyperlipidemia   . Hypertension   . Lumbar stenosis   . Lymphedema   . Neuromuscular disorder ([place])   . Neuropathy associated with endocrine disorder ([place])   . Obesity   . OSA on CPAP    on CPAP  . Osteoarthritis   . Osteoarthritis   . Port-A-Cath in place   . Retinopathy due to secondary diabetes ([place])   . Secondary hyperparathyroidism of renal origin [facility])     Past Surgical History:  Procedure Laterality Date  . ACDF X2    . BACK SURGERY    . Cataract extraction right    . catarect extraction left    . CHOLECYSTECTOMY    . COLONOSCOPY WITH PROPOFOL N/A 12/02/2016   Procedure: COLONOSCOPY WITH PROPOFOL;  Surgeon: Manya Silvas, MD;  Location: [facility] ENDOSCOPY;  Service: Endoscopy;  Laterality:  N/A;  . COLONOSCOPY WITH PROPOFOL N/A 11/10/2017   Procedure: COLONOSCOPY WITH PROPOFOL;  Surgeon: Toledo, Benay Pike, MD;  Location: [facility] ENDOSCOPY;  Service: Gastroenterology;  Laterality: N/A;  . colonoscopy with removal lesions by snare    . Endoscoic carpal tunnel release    . ESOPHAGOGASTRODUODENOSCOPY N/A 11/10/2017   Procedure: ESOPHAGOGASTRODUODENOSCOPY (EGD);  Surgeon: Toledo, Benay Pike, MD;  Location: [facility] ENDOSCOPY;  Service: Gastroenterology;  Laterality: N/A;  . ESOPHAGOGASTRODUODENOSCOPY (EGD) WITH PROPOFOL N/A 12/02/2016   Procedure: ESOPHAGOGASTRODUODENOSCOPY (EGD) WITH PROPOFOL;  Surgeon: Manya Silvas, MD;  Location: [facility] ENDOSCOPY;  Service: Endoscopy;  Laterality: N/A;  . IR IMAGING GUIDED PORT INSERTION  10/01/2017  . Laminectomy posterior lumbar facetectomy and formaninotomy w/Decomp    . Laminectony posterior cervicle decomp w/Facectomy and foraminotomy    . VEIN LIGATION AND STRIPPING Left     There were no vitals filed for this visit.   Subjective Assessment - 09/26/19 1146    Subjective  Sara Bright presents for OT visit 25/36 to address BLE lymphedema. Pt presents with compression wraps in place. She brings custom compression knee highs to clinic for fitting and initial assessment. She denies leg pain.    Pertinent History Chronic  leg swelling and associated pain > 20 years; , Hx recurrent  ulcers, hx of lymphorrhea, Contributing factors include CKD, stage 3, HTN, Obesity, OSA (has cpap), OA. Other pertinent medical hx include B neuropathy hands and feet, hx vein ligation and stripping s/p 5-6 yrs, cervicle and lumbar laminectomies, glaucoma and diabetic retinopathy    Limitations BLE weakness, impaired balance, impaired sensation hands and feet, impaired vision, increased falls risk, decreased hip AROM, claudication    Repetition Increases Symptoms    Special Tests +Stemmer base of toes bilaterally    Patient Stated Goals Want to stop these leg ulcers from returning,  Get leg swelling better under control and keep symptoms from getting worse.    Pain Onset Other (comment)   >20 YEARS                       OT Treatments/Exercises (OP) - 09/26/19 0001      ADLs   ADL Education Given Yes      Manual Therapy   Manual Therapy Edema management    Compression Bandaging fitting and assessment fo initial pair of custom compression knee highs                  OT Education - 09/26/19 1149    Education Details Skilled LE self care training for wear and care regimes for custom elastic compression garments fitted today. Provided  training for donning and doffing garments using assistive devices (friction gloves and nylon/tyvek sock).    Person(s) Educated Patient    Methods Explanation;Demonstration;Handout;Tactile cues;Verbal cues    Comprehension Verbalized understanding;Returned demonstration;Need further instruction;Verbal cues required;Tactile cues required               OT Long Term Goals - 09/13/19 1259      OT LONG TERM GOAL #1   Title Pt will be able to apply BLE, knee length, multi-layer, short stretch compression wraps daily to one leg at a time using correct gradient techniques with MAXIMUM CAREGIVER ASSISTANCE   to achieve optimal limb volume reduction, to return affected limb/s, as closely as possible, to premorbid size and shape, to limit infection risk, and to improve safe functional ambulation and mobility.    Baseline Dependent    Time 4    Period Days    Status Achieved      OT LONG TERM GOAL #2   Title Pt will be able to verbalize signs and symptoms of cellulitis infection and identify at least 4 common lymphedema precautions using printed resource for reference to limit LE progression over time to limit risk of infection and LE exacerbation.    Baseline Max A    Time 4    Period Days    Status Achieved      OT LONG TERM GOAL #3   Title Pt will tolerate knee length,  multilayer short stretch compression  wrap to single leg up to 23/ 7 to achieve optimal swelling reduction to arrest lymphedema progression and in preparation for fitting appropriate compression garments/ devices.    Baseline Max A    Time 6    Period Days    Status Achieved      OT LONG TERM GOAL #4   Title With max CG support daily for compression wrapping  Pt will achieve and sustain at least  85%  compliance with daily LE self-care home program (skin care, lymphatic pumping therex, compression and simple self-MLD) to reduce and control limb swelling, reduce  infection risk and limit LE progression.    Baseline Dependent    Time 12    Period Weeks    Status Achieved      OT LONG TERM GOAL #5   Title Pt will achieve at least 10% limb volume reduction in RLE and 5% reduction in LLE belowthe knees to reduce recurrent leg ulcers, reduce infection risk and limit LE progression.   Good progress. 5.53% reduction measured 07/17/19   Baseline dependent    Time 12    Period Weeks    Status Partially Met                 Plan - 09/26/19 1151    Clinical Impression Statement Completed initial fitting for Mediven flat knit, Mondi, ccl 2 ( 25-1mHg) knee length compression stockings bilaterally. Garments appear to fit well and provide appropriate containment for Pt's level of edema and associated skin changes. Pt able to don custom garments with Max assist after skilled teaching using assistive devices. Pt will practice using Butler frame at home during visit interval with friction gloves to achieve increased functional independence with LE self care. Will complete assessment  next visit considering Pt's feedback from exeprience during interval, and will adjust garment fit with  remake measurments PRN. Good session. Pt pleased with garments intially with no c/o related to fit or comfort.    OT Occupational Profile and History Comprehensive Assessment- Review of records and extensive additional review of physical, cognitive,  psychosocial history related to current functional performance    Occupational performance deficits (Please refer to evaluation for details): ADL's;Leisure;Social Participation;IADL's;Work    Body Structure / Function / Physical Skills ADL;Decreased knowledge of precautions;Flexibility;ROM;Mobility;Decreased knowledge of use of DME;Balance;Skin integrity;Wound;Edema;Pain;IADL    Rehab Potential Good    Clinical Decision Making Several treatment options, min-mod task modification necessary    Comorbidities Affecting Occupational Performance: Presence of comorbidities impacting occupational performance    Comorbidities impacting occupational performance description: see SUBJECTIVE for contibuting comorbidities    Modification or Assistance to Complete Evaluation  Max significant modification of tasks or assist is necessary to complete    OT Frequency 2x / week   2-3 x weekly x 12 weeks and prn   OT Duration 12 weeks   and PRN   OT Treatment/Interventions Self-care/ADL training;Therapeutic exercise;Manual lymph drainage;Compression bandaging;Patient/family education;Other (comment);Therapeutic activities;Manual Therapy;DME and/or AE instruction   SKIN CARE   Plan CDT: MLD, gradient wraps, ther ex, skin care,    OT Home Exercise Plan Caregiver to apply compression wraps daily during visit intervals and assist with donning / doffing compression consistently    Recommended Other Services Consider Velcro-style, adjustable , knee length Jobst, CircAid cmpression wraps as an alternative to traditional elastic stockings for ease of donning    Consulted and Agree with Plan of Care Patient           Patient will benefit from skilled therapeutic intervention in order to improve the following deficits and impairments:   Body Structure / Function / Physical Skills: ADL, Decreased knowledge of precautions, Flexibility, ROM, Mobility, Decreased knowledge of use of DME, Balance, Skin integrity, Wound, Edema,  Pain, IADL       Visit Diagnosis: Lymphedema, not elsewhere classified    Problem List Patient Active Problem List   Diagnosis Date Noted  . GERD (gastroesophageal reflux disease) 01/20/2019  . Hyperlipidemia 01/20/2019  . Claudication ([place] 01/07/2019  . Venous ulcer of left leg ([place] 08/13/2018  . Diabetic polyneuropathy associated with type 2  diabetes mellitus ([place]) 05/30/2018  . Macrocytosis 04/26/2018  . Fecal occult blood test positive 10/08/2017  . IDA (iron deficiency anemia) 10/08/2017  . Chronic venous insufficiency 12/25/2016  . Lymphedema 12/25/2016  . Diabetes ([place]) 12/25/2016  . Essential hypertension 12/25/2016  . Hx of adenomatous colonic polyps 10/22/2016  . DOE (dyspnea on exertion) 07/22/2016  . CKD (chronic kidney disease) stage 3, GFR 30-59 ml/min 06/23/2016  . Swelling of limb 12/31/2015  . Severe obesity (BMI >= 40) ([place]) 12/31/2015  . Varicose veins of both legs with edema 12/31/2015  . VV (varicose veins) 11/27/2015  . Other iron deficiency anemia 10/08/2015  . Anemia due to stage 3 chronic kidney disease 10/08/2015  . BMI 45.0-49.9, adult ([place]) 10/05/2015  . OSA on CPAP 03/10/2015  . Lumbar stenosis with neurogenic claudication 09/30/2011    Andrey Spearman, MS, OTR/L, [facility] 09/26/19 12:00 PM   [place] MAIN [facility] SERVICES [address] [place], [place], [address] Phone: [phone]   Fax:  [phone]  Name: Sara Bright MRN: [id number] Date of Birth: 1940/12/18

## 2019-09-28 ENCOUNTER — Other Ambulatory Visit: Payer: Self-pay

## 2019-09-28 ENCOUNTER — Ambulatory Visit: Payer: Medicare HMO | Admitting: Occupational Therapy

## 2019-09-28 DIAGNOSIS — I890 Lymphedema, not elsewhere classified: Secondary | ICD-10-CM | POA: Diagnosis not present

## 2019-09-28 NOTE — Therapy (Signed)
[place] MAIN [facility] SERVICES [address] [place], [place], [address] Phone: [phone]   Fax:  [phone]  Occupational Therapy Treatment  Patient Details  Name: Sara Bright MRN: [id number] Date of Birth: 1941-03-07 Referring Provider (OT): Eulogio Ditch, [place]   Encounter Date: 09/28/2019   OT End of Session - 09/28/19 1340    Visit Number 26    Number of Visits 36    Date for OT Re-Evaluation 12/12/19    OT Start Time 1110    OT Stop Time 1210    OT Time Calculation (min) 60 min    Equipment Utilized During Treatment friction gloves, Tyvek sock, Medi Lg donning butler frame    Activity Tolerance Patient tolerated treatment well;No increased pain    Behavior During Therapy WFL for tasks assessed/performed           Past Medical History:  Diagnosis Date  . Adenoma of colon   . Adenomatous colon polyp   . Anemia    IDA  . Cataract   . Cervical stenosis of spinal canal   . Chronic kidney disease    stage 3  . Diabetes mellitus without complication ([place])   . GERD (gastroesophageal reflux disease)   . Glaucoma   . Heme positive stool   . History of UTI   . Hx of gallstones   . Hyperkalemia   . Hyperlipidemia   . Hyperlipidemia   . Hypertension   . Lumbar stenosis   . Lymphedema   . Neuromuscular disorder ([place])   . Neuropathy associated with endocrine disorder ([place])   . Obesity   . OSA on CPAP    on CPAP  . Osteoarthritis   . Osteoarthritis   . Port-A-Cath in place   . Retinopathy due to secondary diabetes ([place])   . Secondary hyperparathyroidism of renal origin [facility])     Past Surgical History:  Procedure Laterality Date  . ACDF X2    . BACK SURGERY    . Cataract extraction right    . catarect extraction left    . CHOLECYSTECTOMY    . COLONOSCOPY WITH PROPOFOL N/A 12/02/2016   Procedure: COLONOSCOPY WITH PROPOFOL;  Surgeon: Manya Silvas, MD;  Location: [facility] ENDOSCOPY;  Service: Endoscopy;  Laterality:  N/A;  . COLONOSCOPY WITH PROPOFOL N/A 11/10/2017   Procedure: COLONOSCOPY WITH PROPOFOL;  Surgeon: Toledo, Benay Pike, MD;  Location: [facility] ENDOSCOPY;  Service: Gastroenterology;  Laterality: N/A;  . colonoscopy with removal lesions by snare    . Endoscoic carpal tunnel release    . ESOPHAGOGASTRODUODENOSCOPY N/A 11/10/2017   Procedure: ESOPHAGOGASTRODUODENOSCOPY (EGD);  Surgeon: Toledo, Benay Pike, MD;  Location: [facility] ENDOSCOPY;  Service: Gastroenterology;  Laterality: N/A;  . ESOPHAGOGASTRODUODENOSCOPY (EGD) WITH PROPOFOL N/A 12/02/2016   Procedure: ESOPHAGOGASTRODUODENOSCOPY (EGD) WITH PROPOFOL;  Surgeon: Manya Silvas, MD;  Location: [facility] ENDOSCOPY;  Service: Endoscopy;  Laterality: N/A;  . IR IMAGING GUIDED PORT INSERTION  10/01/2017  . Laminectomy posterior lumbar facetectomy and formaninotomy w/Decomp    . Laminectony posterior cervicle decomp w/Facectomy and foraminotomy    . VEIN LIGATION AND STRIPPING Left     There were no vitals filed for this visit.   Subjective Assessment - 09/28/19 1336    Subjective  Sara Bright presents for OT visit 26/36 to address BLE lymphedema. Pt reports she had quite a bit of difficulty doffing new custom compression knee highs, and difficulty again donning them in the morning. Fmily members were able to assist during  visit interval, but are not always available. Pt desires to be independent with compression.    Pertinent History Chronic leg swelling and associated pain > 20 years; , Hx recurrent  ulcers, hx of lymphorrhea, Contributing factors include CKD, stage 3, HTN, Obesity, OSA (has cpap), OA. Other pertinent medical hx include B neuropathy hands and feet, hx vein ligation and stripping s/p 5-6 yrs, cervicle and lumbar laminectomies, glaucoma and diabetic retinopathy    Limitations BLE weakness, impaired balance, impaired sensation hands and feet, impaired vision, increased falls risk, decreased hip AROM, claudication    Repetition Increases Symptoms     Special Tests +Stemmer base of toes bilaterally    Patient Stated Goals Want to stop these leg ulcers from returning, Get leg swelling better under control and keep symptoms from getting worse.    Pain Onset Other (comment)   >20 YEARS                       OT Treatments/Exercises (OP) - 09/28/19 0001      ADLs   ADL Education Given Yes      Manual Therapy   Manual Therapy Edema management    Manual therapy comments Repeated BLE anatomicaL MEASUREMENTS FOR CUSTOM COMPRESSION KNEE HIGHS. MADE MODIFICATI9ONS TO ADJUST FIT    Compression Bandaging BLE gradient compression wraps as established.                  OT Education - 09/28/19 1340    Education Details Continued skilled Pt/caregiver education  And LE ADL training throughout visit for lymphedema self care/ home program, including compression wrapping, compression garment and device wear/care, lymphatic pumping ther ex, simple self-MLD, and skin care. Discussed progress towards goals.    Person(s) Educated Patient;Child(ren)    Methods Explanation;Demonstration;Handout    Comprehension Verbalized understanding;Returned demonstration;Need further instruction               OT Long Term Goals - 09/13/19 1259      OT LONG TERM GOAL #1   Title Pt will be able to apply BLE, knee length, multi-layer, short stretch compression wraps daily to one leg at a time using correct gradient techniques with MAXIMUM CAREGIVER ASSISTANCE   to achieve optimal limb volume reduction, to return affected limb/s, as closely as possible, to premorbid size and shape, to limit infection risk, and to improve safe functional ambulation and mobility.    Baseline Dependent    Time 4    Period Days    Status Achieved      OT LONG TERM GOAL #2   Title Pt will be able to verbalize signs and symptoms of cellulitis infection and identify at least 4 common lymphedema precautions using printed resource for reference to limit LE progression  over time to limit risk of infection and LE exacerbation.    Baseline Max A    Time 4    Period Days    Status Achieved      OT LONG TERM GOAL #3   Title Pt will tolerate knee length,  multilayer short stretch compression wrap to single leg up to 23/ 7 to achieve optimal swelling reduction to arrest lymphedema progression and in preparation for fitting appropriate compression garments/ devices.    Baseline Max A    Time 6    Period Days    Status Achieved      OT LONG TERM GOAL #4   Title With max CG support daily for compression wrapping  Pt will achieve and sustain at least  85%  compliance with daily LE self-care home program (skin care, lymphatic pumping therex, compression and simple self-MLD) to reduce and control limb swelling, reduce infection risk and limit LE progression.    Baseline Dependent    Time 12    Period Weeks    Status Achieved      OT LONG TERM GOAL #5   Title Pt will achieve at least 10% limb volume reduction in RLE and 5% reduction in LLE belowthe knees to reduce recurrent leg ulcers, reduce infection risk and limit LE progression.   Good progress. 5.53% reduction measured 07/17/19   Baseline dependent    Time 12    Period Weeks    Status Partially Met                 Plan - 09/28/19 1341    Clinical Impression Statement rEPEATED ANATOMICAL MEASUREMENTS FOR ble CUSTON mEDIVEN mONDI COMPRESSION STOCKINGS. rEDUCED COMPRESSION CLASS FROM 3 TO 2 AND used tape more generously to adjust fit to better enable donning and doffing. Cont as per POC.    OT Occupational Profile and History Comprehensive Assessment- Review of records and extensive additional review of physical, cognitive, psychosocial history related to current functional performance    Occupational performance deficits (Please refer to evaluation for details): ADL's;Leisure;Social Participation;IADL's;Work    Body Structure / Function / Physical Skills ADL;Decreased knowledge of  precautions;Flexibility;ROM;Mobility;Decreased knowledge of use of DME;Balance;Skin integrity;Wound;Edema;Pain;IADL    Rehab Potential Good    Clinical Decision Making Several treatment options, min-mod task modification necessary    Comorbidities Affecting Occupational Performance: Presence of comorbidities impacting occupational performance    Comorbidities impacting occupational performance description: see SUBJECTIVE for contibuting comorbidities    Modification or Assistance to Complete Evaluation  Max significant modification of tasks or assist is necessary to complete    OT Frequency 2x / week   2-3 x weekly x 12 weeks and prn   OT Duration 12 weeks   and PRN   OT Treatment/Interventions Self-care/ADL training;Therapeutic exercise;Manual lymph drainage;Compression bandaging;Patient/family education;Other (comment);Therapeutic activities;Manual Therapy;DME and/or AE instruction   SKIN CARE   Plan CDT: MLD, gradient wraps, ther ex, skin care,    OT Home Exercise Plan Caregiver to apply compression wraps daily during visit intervals and assist with donning / doffing compression consistently    Recommended Other Services Consider Velcro-style, adjustable , knee length Jobst, CircAid cmpression wraps as an alternative to traditional elastic stockings for ease of donning    Consulted and Agree with Plan of Care Patient           Patient will benefit from skilled therapeutic intervention in order to improve the following deficits and impairments:   Body Structure / Function / Physical Skills: ADL, Decreased knowledge of precautions, Flexibility, ROM, Mobility, Decreased knowledge of use of DME, Balance, Skin integrity, Wound, Edema, Pain, IADL       Visit Diagnosis: Lymphedema, not elsewhere classified    Problem List Patient Active Problem List   Diagnosis Date Noted  . GERD (gastroesophageal reflux disease) 01/20/2019  . Hyperlipidemia 01/20/2019  . Claudication ([place]) 01/07/2019    . Venous ulcer of left leg ([place]) 08/13/2018  . Diabetic polyneuropathy associated with type 2 diabetes mellitus ([place]) 05/30/2018  . Macrocytosis 04/26/2018  . Fecal occult blood test positive 10/08/2017  . IDA (iron deficiency anemia) 10/08/2017  . Chronic venous insufficiency 12/25/2016  . Lymphedema 12/25/2016  . Diabetes ([place]) 12/25/2016  . Essential hypertension  12/25/2016  . Hx of adenomatous colonic polyps 10/22/2016  . DOE (dyspnea on exertion) 07/22/2016  . CKD (chronic kidney disease) stage 3, GFR 30-59 ml/min 06/23/2016  . Swelling of limb 12/31/2015  . Severe obesity (BMI >= 40) ([place]) 12/31/2015  . Varicose veins of both legs with edema 12/31/2015  . VV (varicose veins) 11/27/2015  . Other iron deficiency anemia 10/08/2015  . Anemia due to stage 3 chronic kidney disease 10/08/2015  . BMI 45.0-49.9, adult ([place]) 10/05/2015  . OSA on CPAP 03/10/2015  . Lumbar stenosis with neurogenic claudication 09/30/2011    Andrey Spearman, MS, OTR/L, [facility] 09/28/19 1:43 PM  [place] MAIN [facility] SERVICES [address] [place], [place], [address] Phone: [phone]   Fax:  [phone]  Name: TERIYAH PURINGTON MRN: [id number] Date of Birth: 23-May-1940

## 2019-10-02 ENCOUNTER — Ambulatory Visit: Payer: Medicare HMO | Admitting: Occupational Therapy

## 2019-10-05 ENCOUNTER — Ambulatory Visit: Payer: Medicare HMO | Admitting: Occupational Therapy

## 2019-10-09 ENCOUNTER — Other Ambulatory Visit: Payer: Self-pay

## 2019-10-09 ENCOUNTER — Ambulatory Visit: Payer: Medicare HMO | Attending: Nurse Practitioner | Admitting: Occupational Therapy

## 2019-10-09 DIAGNOSIS — I890 Lymphedema, not elsewhere classified: Secondary | ICD-10-CM | POA: Diagnosis present

## 2019-10-09 NOTE — Therapy (Signed)
[place] MAIN [facility] SERVICES [address] [place], [place], [address] Phone: [phone]   Fax:  [phone]  Occupational Therapy Treatment  Patient Details  Name: Sara Bright MRN: [id number] Date of Birth: 1940-06-14 Referring Provider (OT): Eulogio Ditch, [place]   Encounter Date: 10/09/2019   OT End of Session - 10/09/19 1246    Visit Number 27    Number of Visits 36    Date for OT Re-Evaluation 12/12/19    OT Start Time 1103    OT Stop Time 1205    OT Time Calculation (min) 62 min    Equipment Utilized During Treatment friction gloves, Tyvek sock, Medi Lg donning butler frame    Activity Tolerance Patient tolerated treatment well;No increased pain    Behavior During Therapy WFL for tasks assessed/performed           Past Medical History:  Diagnosis Date  . Adenoma of colon   . Adenomatous colon polyp   . Anemia    IDA  . Cataract   . Cervical stenosis of spinal canal   . Chronic kidney disease    stage 3  . Diabetes mellitus without complication ([place])   . GERD (gastroesophageal reflux disease)   . Glaucoma   . Heme positive stool   . History of UTI   . Hx of gallstones   . Hyperkalemia   . Hyperlipidemia   . Hyperlipidemia   . Hypertension   . Lumbar stenosis   . Lymphedema   . Neuromuscular disorder ([place])   . Neuropathy associated with endocrine disorder ([place])   . Obesity   . OSA on CPAP    on CPAP  . Osteoarthritis   . Osteoarthritis   . Port-A-Cath in place   . Retinopathy due to secondary diabetes ([employer])   . Secondary hyperparathyroidism of renal origin [facility])     Past Surgical History:  Procedure Laterality Date  . ACDF X2    . BACK SURGERY    . Cataract extraction right    . catarect extraction left    . CHOLECYSTECTOMY    . COLONOSCOPY WITH PROPOFOL N/A 12/02/2016   Procedure: COLONOSCOPY WITH PROPOFOL;  Surgeon: Manya Silvas, MD;  Location: [facility] ENDOSCOPY;  Service: Endoscopy;  Laterality: N/A;   . COLONOSCOPY WITH PROPOFOL N/A 11/10/2017   Procedure: COLONOSCOPY WITH PROPOFOL;  Surgeon: Toledo, Benay Pike, MD;  Location: [facility] ENDOSCOPY;  Service: Gastroenterology;  Laterality: N/A;  . colonoscopy with removal lesions by snare    . Endoscoic carpal tunnel release    . ESOPHAGOGASTRODUODENOSCOPY N/A 11/10/2017   Procedure: ESOPHAGOGASTRODUODENOSCOPY (EGD);  Surgeon: Toledo, Benay Pike, MD;  Location: [facility] ENDOSCOPY;  Service: Gastroenterology;  Laterality: N/A;  . ESOPHAGOGASTRODUODENOSCOPY (EGD) WITH PROPOFOL N/A 12/02/2016   Procedure: ESOPHAGOGASTRODUODENOSCOPY (EGD) WITH PROPOFOL;  Surgeon: Manya Silvas, MD;  Location: [facility] ENDOSCOPY;  Service: Endoscopy;  Laterality: N/A;  . IR IMAGING GUIDED PORT INSERTION  10/01/2017  . Laminectomy posterior lumbar facetectomy and formaninotomy w/Decomp    . Laminectony posterior cervicle decomp w/Facectomy and foraminotomy    . VEIN LIGATION AND STRIPPING Left     There were no vitals filed for this visit.   Subjective Assessment - 10/09/19 1102    Subjective  [name] presents for OT visit 27/36 to address BLE lymphedema. Pt was last seen on 7/22. She had a gout flare in R ankle and was unable to attend OT. Pt has been taking predisone and colchisince   which  has helped reduce symptoms. "It hasn'ty been wrapped     since Tuesday" (day after last visit 1 week ago).    Pertinent History Chronic leg swelling and associated pain > 20 years; , Hx recurrent  ulcers, hx of lymphorrhea, Contributing factors include CKD, stage 3, HTN, Obesity, OSA (has cpap), OA. Other pertinent medical hx include B neuropathy hands and feet, hx vein ligation and stripping s/p 5-6 yrs, cervicle and lumbar laminectomies, glaucoma and diabetic retinopathy    Limitations BLE weakness, impaired balance, impaired sensation hands and feet, impaired vision, increased falls risk, decreased hip AROM, claudication    Repetition Increases Symptoms    Special Tests +Stemmer base of  toes bilaterally    Patient Stated Goals Want to stop these leg ulcers from returning, Get leg swelling better under control and keep symptoms from getting worse.    Pain Onset Other (comment)   >20 YEARS                       OT Treatments/Exercises (OP) - 10/09/19 0001      ADLs   ADL Education Given Yes (P)                   OT Education - 10/09/19 1253    Education Details Pt edu re mechanism of gout and hot it impacts lymphedema. Reviewed  foods/ drinks to avoid to limit gout flares. Explained compression precautions, of which gout flare is not one.    Person(s) Educated Patient;Child(ren)    Methods Explanation;Demonstration;Handout    Comprehension Verbalized understanding;Returned demonstration;Need further instruction               OT Long Term Goals - 09/13/19 1259      OT LONG TERM GOAL #1   Title Pt will be able to apply BLE, knee length, multi-layer, short stretch compression wraps daily to one leg at a time using correct gradient techniques with MAXIMUM CAREGIVER ASSISTANCE   to achieve optimal limb volume reduction, to return affected limb/s, as closely as possible, to premorbid size and shape, to limit infection risk, and to improve safe functional ambulation and mobility.    Baseline Dependent    Time 4    Period Days    Status Achieved      OT LONG TERM GOAL #2   Title Pt will be able to verbalize signs and symptoms of cellulitis infection and identify at least 4 common lymphedema precautions using printed resource for reference to limit LE progression over time to limit risk of infection and LE exacerbation.    Baseline Max A    Time 4    Period Days    Status Achieved      OT LONG TERM GOAL #3   Title Pt will tolerate knee length,  multilayer short stretch compression wrap to single leg up to 23/ 7 to achieve optimal swelling reduction to arrest lymphedema progression and in preparation for fitting appropriate compression garments/  devices.    Baseline Max A    Time 6    Period Days    Status Achieved      OT LONG TERM GOAL #4   Title With max CG support daily for compression wrapping  Pt will achieve and sustain at least  85%  compliance with daily LE self-care home program (skin care, lymphatic pumping therex, compression and simple self-MLD) to reduce and control limb swelling, reduce infection risk and limit LE progression.  Baseline Dependent    Time 12    Period Weeks    Status Achieved      OT LONG TERM GOAL #5   Title Pt will achieve at least 10% limb volume reduction in RLE and 5% reduction in LLE belowthe knees to reduce recurrent leg ulcers, reduce infection risk and limit LE progression.   Good progress. 5.53% reduction measured 07/17/19   Baseline dependent    Time 12    Period Weeks    Status Partially Met                 Plan - 10/09/19 1248    Clinical Impression Statement Markedly increased R ankle and distal leg swelling observed today after recent gout flare ~ 1 wk ago. Skin is soft and supple with WNL temperature. Pt denies pain in BLE. Gout flare seems to have resolved. Swelling most likey due to being without compression for the l;astweek. Pt tolerated MLD as established without increased pain. Applied RLE compression wraps as established. LLE wraps remained in place.Cont as per POC. Resume CDT without modification. Fit with custom compression garments ASAP.    OT Occupational Profile and History Comprehensive Assessment- Review of records and extensive additional review of physical, cognitive, psychosocial history related to current functional performance    Occupational performance deficits (Please refer to evaluation for details): ADL's;Leisure;Social Participation;IADL's;Work    Body Structure / Function / Physical Skills ADL;Decreased knowledge of precautions;Flexibility;ROM;Mobility;Decreased knowledge of use of DME;Balance;Skin integrity;Wound;Edema;Pain;IADL    Rehab Potential  Good    Clinical Decision Making Several treatment options, min-mod task modification necessary    Comorbidities Affecting Occupational Performance: Presence of comorbidities impacting occupational performance    Comorbidities impacting occupational performance description: see SUBJECTIVE for contibuting comorbidities    Modification or Assistance to Complete Evaluation  Max significant modification of tasks or assist is necessary to complete    OT Frequency 2x / week   2-3 x weekly x 12 weeks and prn   OT Duration 12 weeks   and PRN   OT Treatment/Interventions Self-care/ADL training;Therapeutic exercise;Manual lymph drainage;Compression bandaging;Patient/family education;Other (comment);Therapeutic activities;Manual Therapy;DME and/or AE instruction   SKIN CARE   Plan CDT: MLD, gradient wraps, ther ex, skin care,    OT Home Exercise Plan Caregiver to apply compression wraps daily during visit intervals and assist with donning / doffing compression consistently    Recommended Other Services Consider Velcro-style, adjustable , knee length Jobst, CircAid cmpression wraps as an alternative to traditional elastic stockings for ease of donning    Consulted and Agree with Plan of Care Patient           Patient will benefit from skilled therapeutic intervention in order to improve the following deficits and impairments:   Body Structure / Function / Physical Skills: ADL, Decreased knowledge of precautions, Flexibility, ROM, Mobility, Decreased knowledge of use of DME, Balance, Skin integrity, Wound, Edema, Pain, IADL       Visit Diagnosis: Lymphedema, not elsewhere classified    Problem List Patient Active Problem List   Diagnosis Date Noted  . GERD (gastroesophageal reflux disease) 01/20/2019  . Hyperlipidemia 01/20/2019  . Claudication ([place]) 01/07/2019  . Venous ulcer of left leg ([place]) 08/13/2018  . Diabetic polyneuropathy associated with type 2 diabetes mellitus ([place]) 05/30/2018  .  Macrocytosis 04/26/2018  . Fecal occult blood test positive 10/08/2017  . IDA (iron deficiency anemia) 10/08/2017  . Chronic venous insufficiency 12/25/2016  . Lymphedema 12/25/2016  . Diabetes ([place]) 12/25/2016  .  Essential hypertension 12/25/2016  . Hx of adenomatous colonic polyps 10/22/2016  . DOE (dyspnea on exertion) 07/22/2016  . CKD (chronic kidney disease) stage 3, GFR 30-59 ml/min 06/23/2016  . Swelling of limb 12/31/2015  . Severe obesity (BMI >= 40) ([place]) 12/31/2015  . Varicose veins of both legs with edema 12/31/2015  . VV (varicose veins) 11/27/2015  . Other iron deficiency anemia 10/08/2015  . Anemia due to stage 3 chronic kidney disease 10/08/2015  . BMI 45.0-49.9, adult ([place]) 10/05/2015  . OSA on CPAP 03/10/2015  . Lumbar stenosis with neurogenic claudication 09/30/2011    Andrey Spearman, MS, OTR/L, [facility] 10/09/19 12:56 PM   [place] MAIN [facility] SERVICES [address] [place], [place], [address] Phone: [phone]   Fax:  [phone]  Name: Sara Bright MRN: [id number] Date of Birth: 1940/05/12

## 2019-10-09 NOTE — Progress Notes (Signed)
[facility]  [address] [place], [place] [address] Phone: [phone]  Fax: [phone]   Clinic Day:  10/10/2019  Referring physician: Sallee Lange, *  Chief Complaint: Sara Bright is a 79 y.o. female with anemia of chronic kidney disease who is seen for a 3 month assessment.   HPI: The patient was last seen in the hematology clinic on 07/18/2019. At that time, she felt good. She had shortness of breath on exertion. Hematocrit was 31.0, hemoglobin 9.4, MCV 103.7, platelets 142,000, WBC 3,800 (ANC 2,000). Potassium was 5.3 (repeat 5.0 on 07/19/2019). BUN was 50 and creatinine 1.79 (CrCl 30 ml/min). Ferritin was 153. She received Retacrit 10,000 units.  The patient received Retacrit 10,000 units on 08/01/2019 and 08/15/2019, 08/29/2019, and 09/12/2019, 09/26/2019.  Labs followed: 08/01/2019: Hematocrit 31.4. Hemoglobin 9.8. 08/15/2019: Hematocrit 29.1. Hemoglobin 9.1. 08/29/2019: Hematocrit 29.8. Hemoglobin 9.5. 09/12/2019: Hematocrit 29.6. Hemoglobin 9.5. 09/26/2019: Hematocrit 30.1. Hemoglobin 9.3.  During the interim, she has been okay. She still has shortness of breath on exertion. Her leg swelling is stable and they are still wrapped. Her daughter re-wraps her legs everyday and she goes to [facility] twice weekly for this. Her diabetes is "pretty good." Her diet is good. Denies bleeding of any kind. She had gout last week.  She is able to get around with a cane and sometimes uses a walker.   Past Medical History:  Diagnosis Date  . Adenoma of colon   . Adenomatous colon polyp   . Anemia    IDA  . Cataract   . Cervical stenosis of spinal canal   . Chronic kidney disease    stage 3  . Diabetes mellitus without complication ([place])   . GERD (gastroesophageal reflux disease)   . Glaucoma   . Heme positive stool   . History of UTI   . Hx of gallstones   . Hyperkalemia   . Hyperlipidemia   . Hyperlipidemia   .  Hypertension   . Lumbar stenosis   . Lymphedema   . Neuromuscular disorder ([place])   . Neuropathy associated with endocrine disorder ([place])   . Obesity   . OSA on CPAP    on CPAP  . Osteoarthritis   . Osteoarthritis   . Port-A-Cath in place   . Retinopathy due to secondary diabetes ([place])   . Secondary hyperparathyroidism of renal origin [facility])     Past Surgical History:  Procedure Laterality Date  . ACDF X2    . BACK SURGERY    . Cataract extraction right    . catarect extraction left    . CHOLECYSTECTOMY    . COLONOSCOPY WITH PROPOFOL N/A 12/02/2016   Procedure: COLONOSCOPY WITH PROPOFOL;  Surgeon: Manya Silvas, MD;  Location: [facility] ENDOSCOPY;  Service: Endoscopy;  Laterality: N/A;  . COLONOSCOPY WITH PROPOFOL N/A 11/10/2017   Procedure: COLONOSCOPY WITH PROPOFOL;  Surgeon: Toledo, Benay Pike, MD;  Location: [facility] ENDOSCOPY;  Service: Gastroenterology;  Laterality: N/A;  . colonoscopy with removal lesions by snare    . Endoscoic carpal tunnel release    . ESOPHAGOGASTRODUODENOSCOPY N/A 11/10/2017   Procedure: ESOPHAGOGASTRODUODENOSCOPY (EGD);  Surgeon: Toledo, Benay Pike, MD;  Location: [facility] ENDOSCOPY;  Service: Gastroenterology;  Laterality: N/A;  . ESOPHAGOGASTRODUODENOSCOPY (EGD) WITH PROPOFOL N/A 12/02/2016   Procedure: ESOPHAGOGASTRODUODENOSCOPY (EGD) WITH PROPOFOL;  Surgeon: Manya Silvas, MD;  [facility]COPY;  Service: Endoscopy;  Laterality: N/A;  . IR IMAGING GUIDED PORT INSERTION  10/01/2017  . Laminectomy posterior  lumbar facetectomy and formaninotomy w/Decomp    . Laminectony posterior cervicle decomp w/Facectomy and foraminotomy    . VEIN LIGATION AND STRIPPING Left     Family History  Problem Relation Age of Onset  . Coronary artery disease Mother   . Diabetes type II Mother   . Hypertension Mother   . Tuberculosis Father   . Stroke Father   . Diabetes type II Sister   . Migraines Sister   . Alcohol abuse Brother   . Coronary artery disease Brother     . Diabetes type II Brother   . Kidney cancer Brother   . Kidney disease Brother   . Heart attack Brother   . Hypertension Brother     Social History:  reports that she has never smoked. She has never used smokeless tobacco. She reports that she does not drink alcohol and does not use drugs. She lives in [place]. The patient is alone today.  Allergies:  Allergies  Allergen Reactions  . Aspirin     Other reaction(s): Other (see comments)  . Hydrocodone-Acetaminophen Other (See Comments)    Sedation and GI upset, pt is not allergic to acetaminophen  . Statins Other (See Comments)  . Tramadol-Acetaminophen Nausea And Vomiting    GI upset, pt is not allergic to acetaminophen    Current Medications: Current Outpatient Medications  Medication Sig Dispense Refill  . acetaminophen (TYLENOL) 500 MG tablet Take 500 mg by mouth every 6 (six) hours as needed.     Marland Kitchen albuterol (PROVENTIL HFA;VENTOLIN HFA) 108 (90 Base) MCG/ACT inhaler Inhale 2 puffs into the lungs every 6 (six) hours as needed for wheezing or shortness of breath.    Marland Kitchen amLODipine (NORVASC) 10 MG tablet Take 10 mg by mouth daily.    Marland Kitchen aspirin EC 81 MG tablet Take 81 mg by mouth daily.    . ASSURE COMFORT LANCETS 30G MISC     . calcitRIOL (ROCALTROL) 0.25 MCG capsule Take 0.5 mcg by mouth 3 (three) times a week.     . carvedilol (COREG) 6.25 MG tablet Take 6.25 mg by mouth 2 (two) times daily.  12  . ferrous sulfate 325 (65 FE) MG tablet Take 325 mg by mouth daily.    . furosemide (LASIX) 40 MG tablet Take 40 mg by mouth daily.     Marland Kitchen gabapentin (NEURONTIN) 100 MG capsule Take 100 mg by mouth 2 (two) times daily.     Marland Kitchen gemfibrozil (LOPID) 600 MG tablet Take 600 mg by mouth daily.     Marland Kitchen gemfibrozil (LOPID) 600 MG tablet TAKE 1 TABLET BY MOUTH EVERY DAY    . glimepiride (AMARYL) 1 MG tablet Take 0.5 mg by mouth daily with breakfast.     . hydrALAZINE (APRESOLINE) 50 MG tablet Take 25 mg by mouth 3 (three) times daily.   12  .  Lancet Devices (ADJUSTABLE LANCING DEVICE) MISC     . latanoprost (XALATAN) 0.005 % ophthalmic solution Place 1 drop into both eyes.     Marland Kitchen losartan (COZAAR) 50 MG tablet Take 100 mg by mouth daily.     . Multiple Vitamins-Minerals (CENTRUM SILVER) tablet Take 1 tablet by mouth daily.     . Omega-3 Fatty Acids (FISH OIL) 1000 MG CAPS Take 1,000 mg by mouth daily.     Marland Kitchen omeprazole (PRILOSEC) 20 MG capsule TAKE 1 CAPSULE BY MOUTH EVERY DAY    . ONE TOUCH ULTRA TEST test strip     . pioglitazone (ACTOS) 30  MG tablet Take 30 mg by mouth daily.     Marland Kitchen doxycycline (VIBRAMYCIN) 100 MG capsule Take 1 capsule (100 mg total) by mouth 2 (two) times daily. (Patient not taking: Reported on 07/18/2019) 20 capsule 0  . lidocaine-prilocaine (EMLA) cream Apply 1 application topically as needed. (Patient not taking: Reported on 10/10/2019) 30 g 3  . omeprazole (PRILOSEC) 20 MG capsule Take 20 mg by mouth daily.      No current facility-administered medications for this visit.   Facility-Administered Medications Ordered in Other Visits  Medication Dose Route Frequency Provider Last Rate Last Admin  . epoetin alfa-epbx (RETACRIT) injection 10,000 Units  10,000 Units Subcutaneous Once Lequita Asal, MD        Review of Systems  Constitutional: Negative for chills, diaphoresis, fever, malaise/fatigue and weight loss (up 1 lb).       Doing fine  HENT: Negative.  Negative for congestion, ear discharge, ear pain, hearing loss, nosebleeds, sinus pain, sore throat and tinnitus.   Eyes: Negative.  Negative for blurred vision and double vision.  Respiratory: Positive for shortness of breath (on exertion). Negative for cough, hemoptysis, sputum production and wheezing (uses inhaler).   Cardiovascular: Positive for leg swelling (left>right; legs are wrapped up). Negative for chest pain and palpitations.  Gastrointestinal: Negative.  Negative for abdominal pain, blood in stool, constipation, diarrhea, heartburn, melena,  nausea and vomiting.       Eating well.  Genitourinary: Negative.  Negative for dysuria, frequency, hematuria and urgency.  Musculoskeletal: Negative for back pain, myalgias and neck pain.       Patient had gout last week.  Skin: Negative.  Negative for itching and rash.  Neurological: Negative.  Negative for dizziness, tingling, sensory change, speech change, focal weakness, weakness and headaches.  Endo/Heme/Allergies: Does not bruise/bleed easily.       Diabetes, under good control.  No thyroid issues.  Psychiatric/Behavioral: Negative.  Negative for depression and memory loss. The patient is not nervous/anxious and does not have insomnia.   All other systems reviewed and are negative.  Performance status (ECOG):  1  Vitals Blood pressure (!) 148/88, pulse 74, temperature 97.7 F (36.5 C), temperature source Tympanic, resp. rate 18, height 5\' 4"  (1.626 m), weight 264 lb 6.4 oz (119.9 kg), SpO2 100 %.   Physical Exam Vitals and nursing note reviewed.  Constitutional:      General: She is not in acute distress.    Appearance: She is well-developed. She is not diaphoretic.     Comments: She has a cane by her side.  She needs some assistance onto exam room table.  HENT:     Head: Normocephalic and atraumatic.     Mouth/Throat:     Mouth: Mucous membranes are moist.     Pharynx: Oropharynx is clear. No oropharyngeal exudate.  Eyes:     General: No scleral icterus.    Extraocular Movements: Extraocular movements intact.     Conjunctiva/sclera: Conjunctivae normal.     Pupils: Pupils are equal, round, and reactive to light.     Comments: Glasses.  Brown eyes.  Neck:     Vascular: No JVD.  Cardiovascular:     Rate and Rhythm: Normal rate and regular rhythm.     Heart sounds: Normal heart sounds. No murmur heard.  No gallop.   Pulmonary:     Effort: Pulmonary effort is normal. No respiratory distress.     Breath sounds: Normal breath sounds. No wheezing or rales.  Chest:  Chest wall: No tenderness.  Abdominal:     General: Bowel sounds are normal. There is no distension.     Palpations: Abdomen is soft. There is no mass.     Tenderness: There is no abdominal tenderness. There is no guarding or rebound.  Musculoskeletal:        General: No tenderness. Normal range of motion.     Cervical back: Normal range of motion and neck supple.     Comments: Legs are wrapped up.  Lymphadenopathy:     Head:     Right side of head: No preauricular, posterior auricular or occipital adenopathy.     Left side of head: No preauricular, posterior auricular or occipital adenopathy.     Cervical: No cervical adenopathy.     Upper Body:     Right upper body: No supraclavicular or axillary adenopathy.     Left upper body: No supraclavicular or axillary adenopathy.     Lower Body: No right inguinal adenopathy. No left inguinal adenopathy.  Skin:    General: Skin is warm and dry.     Coloration: Skin is not pale.     Findings: No erythema or rash.  Neurological:     Mental Status: She is alert and oriented to person, place, and time.     Deep Tendon Reflexes: Reflexes are normal and symmetric.  Psychiatric:        Behavior: Behavior normal.        Thought Content: Thought content normal.        Judgment: Judgment normal.    Infusion on 10/10/2019  Component Date Value Ref Range Status  . Sodium 10/10/2019 139  135 - 145 mmol/L Final  . Potassium 10/10/2019 4.7  3.5 - 5.1 mmol/L Final  . Chloride 10/10/2019 112* 98 - 111 mmol/L Final  . CO2 10/10/2019 22  22 - 32 mmol/L Final  . Glucose, Bld 10/10/2019 79  70 - 99 mg/dL Final   Glucose reference range applies only to samples taken after fasting for at least 8 hours.  . BUN 10/10/2019 49* 8 - 23 mg/dL Final  . Creatinine, Ser 10/10/2019 1.54* 0.44 - 1.00 mg/dL Final  . Calcium 10/10/2019 9.8  8.9 - 10.3 mg/dL Final  . GFR calc non Af Amer 10/10/2019 32* >60 mL/min Final  . GFR calc Af Amer 10/10/2019 37* >60 mL/min  Final  . Anion gap 10/10/2019 5  5 - 15 Final   Performed at [facility], [address], [place], [place] [address]  . WBC 10/10/2019 4.9  4.0 - 10.5 K/uL Final  . RBC 10/10/2019 2.93* 3.87 - 5.11 MIL/uL Final  . Hemoglobin 10/10/2019 9.3* 12.0 - 15.0 g/dL Final  . HCT 10/10/2019 29.9* 36 - 46 % Final  . MCV 10/10/2019 102.0* 80.0 - 100.0 fL Final  . MCH 10/10/2019 31.7  26.0 - 34.0 pg Final  . MCHC 10/10/2019 31.1  30.0 - 36.0 g/dL Final  . RDW 10/10/2019 14.7  11.5 - 15.5 % Final  . Platelets 10/10/2019 236  150 - 400 K/uL Final  . nRBC 10/10/2019 0.0  0.0 - 0.2 % Final  . Neutrophils Relative % 10/10/2019 42  % Final  . Neutro Abs 10/10/2019 2.1  1.7 - 7.7 K/uL Final  . Lymphocytes Relative 10/10/2019 42  % Final  . Lymphs Abs 10/10/2019 2.1  0.7 - 4.0 K/uL Final  . Monocytes Relative 10/10/2019 11  % Final  . Monocytes Absolute 10/10/2019 0.5  0 -  1 K/uL Final  . Eosinophils Relative 10/10/2019 3  % Final  . Eosinophils Absolute 10/10/2019 0.1  0 - 0 K/uL Final  . Basophils Relative 10/10/2019 1  % Final  . Basophils Absolute 10/10/2019 0.1  0 - 0 K/uL Final  . Immature Granulocytes 10/10/2019 1  % Final  . Abs Immature Granulocytes 10/10/2019 0.05  0.00 - 0.07 K/uL Final   Performed at [facility], [address], [place],  [address]    Assessment:  Sara Bright is a 79 y.o. female withanemia of chronic kidney disease.She has stage III chronic kidney disease. SPEP and free light chain ratio was normal on 11/12/2015.  She receivedAranesp 200 mcg on 09/02/2016, 12/22/2016, 03/23/2017, 07/20/2017, 08/17/2017, and 09/15/2017. She received Aranesp 300 mcgon 10/06/2017, 10/19/2017, 01/04/2018, 03/08/2018,and03/05/2018.She began Retacrit10,000 units on 07/04/2018 (last 01/17/2019).  She received 1 unit of PRBCson 09/22/2017. She received Venoferx 4 (10/22/2015 - 11/05/2015), x4 (01/21/2016 - 02/11/2016), 11/24/2016, 06/22/2017,  09/14/2017, 10/01/2017, 10/12/2017, 10/26/2017, 11/16/2017, and 03/01/2018.  Ferritinhas been followed: 63 on 10/08/2015, 290 on 01/14/2016, 651 on 05/12/2016, 467 on 09/01/2016, 413 on 11/19/2016, 633 on 12/22/2016, 374 on 01/19/2017, 480 on 06/22/2017, 135 on 09/22/2017, 159 on 10/06/2017, 373 on 03/01/2018, 289 on 03/29/2018, 257 on 07/04/2018, 217 on 08/30/2018, 178 on 11/08/2018, 191 on 01/31/2019, and 145 on 04/25/2019.   EGDon 09/04/2019revealed an irregularZ-line irregular, at the gastroesophageal junction, normal stomach, and normal duodenum.Colonoscopyon 11/10/2017 revealed diverticulosis in the sigmoid colonand non-bleeding internal hemorrhoids.  She has macrocytic RBC indices. B12, folate, and TSH were normal on 05/09/2019.  She received her first COVID-19 vaccine on 04/19/2019, second dose was on 05/17/2019.   Symptomatically, she feels "ok".  She notes some shortness of breath on exertion.  She denies any bleeding.  Exam is stable.  Plan: 1.   Labs today: CBC with diff, BMP, ferritin 2.Anemia of chronic renal disease Hematocrit29.9. Hemoglobin9.3.  MCV 102.0 Ferritin32.   Maintain ferritin >100. Retacrit today. 3.Macrocytosis MCV 102.0. She has had macrocytic RBC indices since07/26/2019. She has no known liver disease. B12 was727,folate25,andTSH 5.026 on 05/09/2019. Continue to monitor. 4.Renal insufficiency Creatinine1.54. She is followed by Dr. Holley Raring. 5.   Retacrit today. 6.   RTC every 2 weeks x 6 for labs (HCT/Hgb) and +/- Retacrit.  7.   RTC in 12 weeks for MD assessment, labs (CBC with diff, BMP, ferritin) and +/- Retacrit.  I discussed the assessment and treatment plan with the patient.  The patient was provided an opportunity to ask questions and all were answered.  The patient agreed with the plan and  demonstrated an understanding of the instructions.  The patient was advised to call back if the symptoms worsen or if the condition fails to improve as anticipated.   Lequita Asal, MD, PhD    10/10/2019, 10:28 AM  I, Mirian Mo Tufford, am acting as Education administrator for [employer]. Mike Gip, MD, PhD.  I, Baeleigh Devincent C. Mike Gip, MD, have reviewed the above documentation for accuracy and completeness, and I agree with the above.

## 2019-10-10 ENCOUNTER — Inpatient Hospital Stay: Payer: Medicare HMO | Attending: Hematology and Oncology

## 2019-10-10 ENCOUNTER — Other Ambulatory Visit: Payer: Self-pay

## 2019-10-10 ENCOUNTER — Inpatient Hospital Stay (HOSPITAL_BASED_OUTPATIENT_CLINIC_OR_DEPARTMENT_OTHER): Payer: Medicare HMO | Admitting: Hematology and Oncology

## 2019-10-10 ENCOUNTER — Encounter: Payer: Self-pay | Admitting: Hematology and Oncology

## 2019-10-10 ENCOUNTER — Inpatient Hospital Stay: Payer: Medicare HMO

## 2019-10-10 VITALS — BP 148/88 | HR 74 | Temp 97.7°F | Resp 18 | Ht 64.0 in | Wt 264.4 lb

## 2019-10-10 DIAGNOSIS — N1832 Chronic kidney disease, stage 3b: Secondary | ICD-10-CM

## 2019-10-10 DIAGNOSIS — Z811 Family history of alcohol abuse and dependence: Secondary | ICD-10-CM | POA: Insufficient documentation

## 2019-10-10 DIAGNOSIS — D631 Anemia in chronic kidney disease: Secondary | ICD-10-CM | POA: Diagnosis present

## 2019-10-10 DIAGNOSIS — M109 Gout, unspecified: Secondary | ICD-10-CM | POA: Insufficient documentation

## 2019-10-10 DIAGNOSIS — D7589 Other specified diseases of blood and blood-forming organs: Secondary | ICD-10-CM

## 2019-10-10 DIAGNOSIS — R0602 Shortness of breath: Secondary | ICD-10-CM | POA: Diagnosis not present

## 2019-10-10 DIAGNOSIS — N183 Chronic kidney disease, stage 3 unspecified: Secondary | ICD-10-CM

## 2019-10-10 DIAGNOSIS — E1122 Type 2 diabetes mellitus with diabetic chronic kidney disease: Secondary | ICD-10-CM | POA: Diagnosis not present

## 2019-10-10 DIAGNOSIS — Z823 Family history of stroke: Secondary | ICD-10-CM | POA: Insufficient documentation

## 2019-10-10 DIAGNOSIS — Z841 Family history of disorders of kidney and ureter: Secondary | ICD-10-CM | POA: Insufficient documentation

## 2019-10-10 DIAGNOSIS — Z833 Family history of diabetes mellitus: Secondary | ICD-10-CM | POA: Insufficient documentation

## 2019-10-10 DIAGNOSIS — Z79899 Other long term (current) drug therapy: Secondary | ICD-10-CM | POA: Diagnosis not present

## 2019-10-10 DIAGNOSIS — Z836 Family history of other diseases of the respiratory system: Secondary | ICD-10-CM | POA: Diagnosis not present

## 2019-10-10 DIAGNOSIS — Z8249 Family history of ischemic heart disease and other diseases of the circulatory system: Secondary | ICD-10-CM | POA: Diagnosis not present

## 2019-10-10 DIAGNOSIS — M7989 Other specified soft tissue disorders: Secondary | ICD-10-CM | POA: Diagnosis not present

## 2019-10-10 DIAGNOSIS — I129 Hypertensive chronic kidney disease with stage 1 through stage 4 chronic kidney disease, or unspecified chronic kidney disease: Secondary | ICD-10-CM | POA: Insufficient documentation

## 2019-10-10 DIAGNOSIS — Z8051 Family history of malignant neoplasm of kidney: Secondary | ICD-10-CM | POA: Insufficient documentation

## 2019-10-10 LAB — CBC WITH DIFFERENTIAL/PLATELET
Abs Immature Granulocytes: 0.05 10*3/uL (ref 0.00–0.07)
Basophils Absolute: 0.1 10*3/uL (ref 0.0–0.1)
Basophils Relative: 1 %
Eosinophils Absolute: 0.1 10*3/uL (ref 0.0–0.5)
Eosinophils Relative: 3 %
HCT: 29.9 % — ABNORMAL LOW (ref 36.0–46.0)
Hemoglobin: 9.3 g/dL — ABNORMAL LOW (ref 12.0–15.0)
Immature Granulocytes: 1 %
Lymphocytes Relative: 42 %
Lymphs Abs: 2.1 10*3/uL (ref 0.7–4.0)
MCH: 31.7 pg (ref 26.0–34.0)
MCHC: 31.1 g/dL (ref 30.0–36.0)
MCV: 102 fL — ABNORMAL HIGH (ref 80.0–100.0)
Monocytes Absolute: 0.5 10*3/uL (ref 0.1–1.0)
Monocytes Relative: 11 %
Neutro Abs: 2.1 10*3/uL (ref 1.7–7.7)
Neutrophils Relative %: 42 %
Platelets: 236 10*3/uL (ref 150–400)
RBC: 2.93 MIL/uL — ABNORMAL LOW (ref 3.87–5.11)
RDW: 14.7 % (ref 11.5–15.5)
WBC: 4.9 10*3/uL (ref 4.0–10.5)
nRBC: 0 % (ref 0.0–0.2)

## 2019-10-10 LAB — BASIC METABOLIC PANEL
Anion gap: 5 (ref 5–15)
BUN: 49 mg/dL — ABNORMAL HIGH (ref 8–23)
CO2: 22 mmol/L (ref 22–32)
Calcium: 9.8 mg/dL (ref 8.9–10.3)
Chloride: 112 mmol/L — ABNORMAL HIGH (ref 98–111)
Creatinine, Ser: 1.54 mg/dL — ABNORMAL HIGH (ref 0.44–1.00)
GFR calc Af Amer: 37 mL/min — ABNORMAL LOW (ref >60)
GFR calc non Af Amer: 32 mL/min — ABNORMAL LOW (ref >60)
Glucose, Bld: 79 mg/dL (ref 70–99)
Potassium: 4.7 mmol/L (ref 3.5–5.1)
Sodium: 139 mmol/L (ref 135–145)

## 2019-10-10 LAB — FERRITIN: Ferritin: 132 ng/mL (ref 11–307)

## 2019-10-10 MED ORDER — EPOETIN ALFA-EPBX 10000 UNIT/ML IJ SOLN
10000.0000 [IU] | Freq: Once | INTRAMUSCULAR | Status: AC
  Administered 2019-10-10: 10000 [IU] via SUBCUTANEOUS
  Filled 2019-10-10: qty 1

## 2019-10-10 NOTE — Progress Notes (Signed)
No new changes noted today 

## 2019-10-12 ENCOUNTER — Other Ambulatory Visit: Payer: Self-pay

## 2019-10-12 ENCOUNTER — Ambulatory Visit: Payer: Medicare HMO | Admitting: Occupational Therapy

## 2019-10-12 DIAGNOSIS — I890 Lymphedema, not elsewhere classified: Secondary | ICD-10-CM

## 2019-10-12 NOTE — Therapy (Signed)
[place] MAIN [facility] SERVICES [address] [place], [place], [address] Phone: [phone]   Fax:  [phone]  Occupational Therapy Treatment  Patient Details  Name: Sara Bright MRN: [id number] Date of Birth: 1940/03/16 Referring Provider (OT): Eulogio Ditch, [place]   Encounter Date: 10/12/2019   OT End of Session - 10/12/19 1355    Visit Number 28    Number of Visits 36    Date for OT Re-Evaluation 12/12/19    OT Start Time 1115    OT Stop Time 1215    OT Time Calculation (min) 60 min    Equipment Utilized During Treatment friction gloves, Tyvek sock, Medi Lg donning butler frame    Activity Tolerance Patient tolerated treatment well;No increased pain    Behavior During Therapy WFL for tasks assessed/performed           Past Medical History:  Diagnosis Date  . Adenoma of colon   . Adenomatous colon polyp   . Anemia    IDA  . Cataract   . Cervical stenosis of spinal canal   . Chronic kidney disease    stage 3  . Diabetes mellitus without complication ([place])   . GERD (gastroesophageal reflux disease)   . Glaucoma   . Heme positive stool   . History of UTI   . Hx of gallstones   . Hyperkalemia   . Hyperlipidemia   . Hyperlipidemia   . Hypertension   . Lumbar stenosis   . Lymphedema   . Neuromuscular disorder ([place])   . Neuropathy associated with endocrine disorder ([place])   . Obesity   . OSA on CPAP    on CPAP  . Osteoarthritis   . Osteoarthritis   . Port-A-Cath in place   . Retinopathy due to secondary diabetes ([place])   . Secondary hyperparathyroidism of renal origin [facility])     Past Surgical History:  Procedure Laterality Date  . ACDF X2    . BACK SURGERY    . Cataract extraction right    . catarect extraction left    . CHOLECYSTECTOMY    . COLONOSCOPY WITH PROPOFOL N/A 12/02/2016   Procedure: COLONOSCOPY WITH PROPOFOL;  Surgeon: Manya Silvas, MD;  Location: [facility] ENDOSCOPY;  Service: Endoscopy;  Laterality: N/A;   . COLONOSCOPY WITH PROPOFOL N/A 11/10/2017   Procedure: COLONOSCOPY WITH PROPOFOL;  Surgeon: Toledo, Benay Pike, MD;  Location: [facility] ENDOSCOPY;  Service: Gastroenterology;  Laterality: N/A;  . colonoscopy with removal lesions by snare    . Endoscoic carpal tunnel release    . ESOPHAGOGASTRODUODENOSCOPY N/A 11/10/2017   Procedure: ESOPHAGOGASTRODUODENOSCOPY (EGD);  Surgeon: Toledo, Benay Pike, MD;  Location: [facility] ENDOSCOPY;  Service: Gastroenterology;  Laterality: N/A;  . ESOPHAGOGASTRODUODENOSCOPY (EGD) WITH PROPOFOL N/A 12/02/2016   Procedure: ESOPHAGOGASTRODUODENOSCOPY (EGD) WITH PROPOFOL;  Surgeon: Manya Silvas, MD;  Location: [facility] ENDOSCOPY;  Service: Endoscopy;  Laterality: N/A;  . IR IMAGING GUIDED PORT INSERTION  10/01/2017  . Laminectomy posterior lumbar facetectomy and formaninotomy w/Decomp    . Laminectony posterior cervicle decomp w/Facectomy and foraminotomy    . VEIN LIGATION AND STRIPPING Left     There were no vitals filed for this visit.   Subjective Assessment - 10/12/19 1120    Subjective  Sara Bright presents for OT visit 28/36 to address BLE lymphedema. Pt denies leg pain this morning. Gout flare has resolved. Pt brings remade initial comression garments to clinic for fitting.    Pertinent History Chronic leg swelling and associated  pain > 20 years; , Hx recurrent  ulcers, hx of lymphorrhea, Contributing factors include CKD, stage 3, HTN, Obesity, OSA (has cpap), OA. Other pertinent medical hx include B neuropathy hands and feet, hx vein ligation and stripping s/p 5-6 yrs, cervicle and lumbar laminectomies, glaucoma and diabetic retinopathy    Limitations BLE weakness, impaired balance, impaired sensation hands and feet, impaired vision, increased falls risk, decreased hip AROM, claudication    Repetition Increases Symptoms    Special Tests +Stemmer base of toes bilaterally    Patient Stated Goals Want to stop these leg ulcers from returning, Get leg swelling better under  control and keep symptoms from getting worse.    Pain Onset Other (comment)   >20 YEARS                       OT Treatments/Exercises (OP) - 10/12/19 0001      ADLs   ADL Education Given Yes (P)                   OT Education - 10/12/19 1353    Education Details Pt edu for donning and doffing custom knee length compression garments using various assistive devices, including friction gloves, friction mat, Medi OFF Butler, ripstop nylon slippie sock.,    Person(s) Educated Patient    Methods Explanation;Demonstration;Handout;Tactile cues;Verbal cues    Comprehension Verbalized understanding;Returned demonstration;Need further instruction;Verbal cues required;Tactile cues required;Other (comment)   loaned equipment for practive over visit interval              OT Long Term Goals - 09/13/19 1259      OT LONG TERM GOAL #1   Title Pt will be able to apply BLE, knee length, multi-layer, short stretch compression wraps daily to one leg at a time using correct gradient techniques with MAXIMUM CAREGIVER ASSISTANCE   to achieve optimal limb volume reduction, to return affected limb/s, as closely as possible, to premorbid size and shape, to limit infection risk, and to improve safe functional ambulation and mobility.    Baseline Dependent    Time 4    Period Days    Status Achieved      OT LONG TERM GOAL #2   Title Pt will be able to verbalize signs and symptoms of cellulitis infection and identify at least 4 common lymphedema precautions using printed resource for reference to limit LE progression over time to limit risk of infection and LE exacerbation.    Baseline Max A    Time 4    Period Days    Status Achieved      OT LONG TERM GOAL #3   Title Pt will tolerate knee length,  multilayer short stretch compression wrap to single leg up to 23/ 7 to achieve optimal swelling reduction to arrest lymphedema progression and in preparation for fitting appropriate  compression garments/ devices.    Baseline Max A    Time 6    Period Days    Status Achieved      OT LONG TERM GOAL #4   Title With max CG support daily for compression wrapping  Pt will achieve and sustain at least  85%  compliance with daily LE self-care home program (skin care, lymphatic pumping therex, compression and simple self-MLD) to reduce and control limb swelling, reduce infection risk and limit LE progression.    Baseline Dependent    Time 12    Period Weeks    Status Achieved  OT LONG TERM GOAL #5   Title Pt will achieve at least 10% limb volume reduction in RLE and 5% reduction in LLE belowthe knees to reduce recurrent leg ulcers, reduce infection risk and limit LE progression.   Good progress. 5.53% reduction measured 07/17/19   Baseline dependent    Time 12    Period Weeks    Status Partially Met                 Plan - 10/12/19 1355    Clinical Impression Statement Remade custom compression knee highs fit better than initial pair. Majority of session dediucated to teaching Pt to don and doff garments using various assistive devices and positioning techniques. Biggest obstacle is limited hip and knee AROM and decreased hand strenth. Pt borrowed equipment over the weekend to practice with her daughter. Cnt as per POC.    OT Occupational Profile and History Comprehensive Assessment- Review of records and extensive additional review of physical, cognitive, psychosocial history related to current functional performance    Occupational performance deficits (Please refer to evaluation for details): ADL's;Leisure;Social Participation;IADL's;Work    Body Structure / Function / Physical Skills ADL;Decreased knowledge of precautions;Flexibility;ROM;Mobility;Decreased knowledge of use of DME;Balance;Skin integrity;Wound;Edema;Pain;IADL    Rehab Potential Good    Clinical Decision Making Several treatment options, min-mod task modification necessary    Comorbidities  Affecting Occupational Performance: Presence of comorbidities impacting occupational performance    Comorbidities impacting occupational performance description: see SUBJECTIVE for contibuting comorbidities    Modification or Assistance to Complete Evaluation  Max significant modification of tasks or assist is necessary to complete    OT Frequency 2x / week   2-3 x weekly x 12 weeks and prn   OT Duration 12 weeks   and PRN   OT Treatment/Interventions Self-care/ADL training;Therapeutic exercise;Manual lymph drainage;Compression bandaging;Patient/family education;Other (comment);Therapeutic activities;Manual Therapy;DME and/or AE instruction   SKIN CARE   Plan CDT: MLD, gradient wraps, ther ex, skin care,    OT Home Exercise Plan Caregiver to apply compression wraps daily during visit intervals and assist with donning / doffing compression consistently    Recommended Other Services Consider Velcro-style, adjustable , knee length Jobst, CircAid cmpression wraps as an alternative to traditional elastic stockings for ease of donning    Consulted and Agree with Plan of Care Patient           Patient will benefit from skilled therapeutic intervention in order to improve the following deficits and impairments:   Body Structure / Function / Physical Skills: ADL, Decreased knowledge of precautions, Flexibility, ROM, Mobility, Decreased knowledge of use of DME, Balance, Skin integrity, Wound, Edema, Pain, IADL       Visit Diagnosis: Lymphedema, not elsewhere classified    Problem List Patient Active Problem List   Diagnosis Date Noted  . GERD (gastroesophageal reflux disease) 01/20/2019  . Hyperlipidemia 01/20/2019  . Claudication ([place]) 01/07/2019  . Venous ulcer of left leg ([place]) 08/13/2018  . Diabetic polyneuropathy associated with type 2 diabetes mellitus ([place]) 05/30/2018  . Macrocytosis 04/26/2018  . Fecal occult blood test positive 10/08/2017  . IDA (iron deficiency anemia) 10/08/2017    . Chronic venous insufficiency 12/25/2016  . Lymphedema 12/25/2016  . Diabetes ([place]) 12/25/2016  . Essential hypertension 12/25/2016  . Hx of adenomatous colonic polyps 10/22/2016  . DOE (dyspnea on exertion) 07/22/2016  . CKD (chronic kidney disease) stage 3, GFR 30-59 ml/min 06/23/2016  . Swelling of limb 12/31/2015  . Severe obesity (BMI >= 40) ([place]) 12/31/2015  .  Varicose veins of both legs with edema 12/31/2015  . VV (varicose veins) 11/27/2015  . Other iron deficiency anemia 10/08/2015  . Anemia due to stage 3 chronic kidney disease 10/08/2015  . BMI 45.0-49.9, adult ([place]) 10/05/2015  . OSA on CPAP 03/10/2015  . Lumbar stenosis with neurogenic claudication 09/30/2011    Andrey Spearman, MS, OTR/L, [facility] 10/12/19 1:59 PM  [place] MAIN [facility] SERVICES [address] [place], [place], [address] Phone: [phone]   Fax:  [phone]  Name: Sara Bright MRN: [id number] Date of Birth: [birth year]

## 2019-10-16 ENCOUNTER — Encounter: Payer: Medicare HMO | Admitting: Occupational Therapy

## 2019-10-19 ENCOUNTER — Ambulatory Visit: Payer: Medicare HMO | Admitting: Occupational Therapy

## 2019-10-19 ENCOUNTER — Other Ambulatory Visit: Payer: Self-pay

## 2019-10-19 DIAGNOSIS — I890 Lymphedema, not elsewhere classified: Secondary | ICD-10-CM

## 2019-10-19 NOTE — Therapy (Signed)
[place] MAIN [facility] SERVICES [address] [place], [place], [address] Phone: [phone]   Fax:  [phone]  Occupational Therapy Treatment  Patient Details  Name: ZAMIYA DILLARD MRN: [id number] Date of Birth: March 31, 1940 Referring Provider (OT): Eulogio Ditch, [place]   Encounter Date: 10/19/2019   OT End of Session - 10/19/19 1242    Visit Number 29    Number of Visits 36    Date for OT Re-Evaluation 12/12/19    OT Start Time 1105    OT Stop Time 1215    OT Time Calculation (min) 70 min    Equipment Utilized During Treatment friction gloves, Tyvek sock, Medi Lg donning butler frame    Activity Tolerance Patient tolerated treatment well;No increased pain    Behavior During Therapy WFL for tasks assessed/performed           Past Medical History:  Diagnosis Date  . Adenoma of colon   . Adenomatous colon polyp   . Anemia    IDA  . Cataract   . Cervical stenosis of spinal canal   . Chronic kidney disease    stage 3  . Diabetes mellitus without complication ([place])   . GERD (gastroesophageal reflux disease)   . Glaucoma   . Heme positive stool   . History of UTI   . Hx of gallstones   . Hyperkalemia   . Hyperlipidemia   . Hyperlipidemia   . Hypertension   . Lumbar stenosis   . Lymphedema   . Neuromuscular disorder ([place])   . Neuropathy associated with endocrine disorder ([place])   . Obesity   . OSA on CPAP    on CPAP  . Osteoarthritis   . Osteoarthritis   . Port-A-Cath in place   . Retinopathy due to secondary diabetes ([place])   . Secondary hyperparathyroidism of renal origin [facility])     Past Surgical History:  Procedure Laterality Date  . ACDF X2    . BACK SURGERY    . Cataract extraction right    . catarect extraction left    . CHOLECYSTECTOMY    . COLONOSCOPY WITH PROPOFOL N/A 12/02/2016   Procedure: COLONOSCOPY WITH PROPOFOL;  Surgeon: Manya Silvas, MD;  Location: [facility] ENDOSCOPY;  Service: Endoscopy;  Laterality:  N/A;  . COLONOSCOPY WITH PROPOFOL N/A 11/10/2017   Procedure: COLONOSCOPY WITH PROPOFOL;  Surgeon: Toledo, Benay Pike, MD;  Location: [facility] ENDOSCOPY;  Service: Gastroenterology;  Laterality: N/A;  . colonoscopy with removal lesions by snare    . Endoscoic carpal tunnel release    . ESOPHAGOGASTRODUODENOSCOPY N/A 11/10/2017   Procedure: ESOPHAGOGASTRODUODENOSCOPY (EGD);  Surgeon: Toledo, Benay Pike, MD;  Location: [facility] ENDOSCOPY;  Service: Gastroenterology;  Laterality: N/A;  . ESOPHAGOGASTRODUODENOSCOPY (EGD) WITH PROPOFOL N/A 12/02/2016   Procedure: ESOPHAGOGASTRODUODENOSCOPY (EGD) WITH PROPOFOL;  Surgeon: Manya Silvas, MD;  Location: [facility] ENDOSCOPY;  Service: Endoscopy;  Laterality: N/A;  . IR IMAGING GUIDED PORT INSERTION  10/01/2017  . Laminectomy posterior lumbar facetectomy and formaninotomy w/Decomp    . Laminectony posterior cervicle decomp w/Facectomy and foraminotomy    . VEIN LIGATION AND STRIPPING Left     There were no vitals filed for this visit.                         OT Education - 10/19/19 1240    Education Details Pt edu re doffing custom compression garments using long handled shoe horn. Pt has mastered. Pt edu for garment  replacement. Discussed transition to Self- Management Phase CDT and importance of establishing LE self-care routine.    Person(s) Educated Patient    Methods Explanation;Demonstration;Handout;Tactile cues;Verbal cues    Comprehension Verbalized understanding;Returned demonstration;Need further instruction;Verbal cues required;Tactile cues required;Other (comment)   loaned equipment for practive over visit interval              OT Long Term Goals - 10/19/19 1250      OT LONG TERM GOAL #1   Title Pt will be able to apply BLE, knee length, multi-layer, short stretch compression wraps daily to one leg at a time using correct gradient techniques with MAXIMUM CAREGIVER ASSISTANCE   to achieve optimal limb volume reduction, to return  affected limb/s, as closely as possible, to premorbid size and shape, to limit infection risk, and to improve safe functional ambulation and mobility.    Baseline Dependent    Time 4    Period Days    Status Achieved      OT LONG TERM GOAL #2   Title Pt will be able to verbalize signs and symptoms of cellulitis infection and identify at least 4 common lymphedema precautions using printed resource for reference to limit LE progression over time to limit risk of infection and LE exacerbation.    Baseline Max A    Time 4    Period Days    Status Achieved      OT LONG TERM GOAL #3   Title Pt will tolerate knee length,  multilayer short stretch compression wrap to single leg up to 23/ 7 to achieve optimal swelling reduction to arrest lymphedema progression and in preparation for fitting appropriate compression garments/ devices.    Baseline Max A    Time 6    Period Days    Status Achieved      OT LONG TERM GOAL #4   Title With max CG support daily for compression wrapping  Pt will achieve and sustain at least  85%  compliance with daily LE self-care home program (skin care, lymphatic pumping therex, compression and simple self-MLD) to reduce and control limb swelling, reduce infection risk and limit LE progression.    Baseline Dependent    Time 12    Period Weeks    Status Achieved      OT LONG TERM GOAL #5   Title Pt will achieve at least 10% limb volume reduction in RLE and 5% reduction in LLE belowthe knees to reduce recurrent leg ulcers, reduce infection risk and limit LE progression.   Good progress. 5.53% reduction measured 07/17/19   Baseline dependent    Time 12    Period Weeks    Status Partially Met                 Plan - 10/19/19 1243    Clinical Impression Statement Pt has mastered donning and doffing custom BLE knee length compression garments using assistive devices (friction gloves, slippie sock and long handled shoe horn) with extra time (modified independence.  She is 100% compliant with skin care and daily compression and continues to doe ther ex a few times weekly. Pt admits she has not been using Flexitouch device "as often as I should." OT encouraged Pt to establish a do-able routine with Flexi at least 3-4 x weekly as she is transitioning away from clinical care to self management. Pt tolerated MLD and skin care today without increased pain. Max A to don LLE compression garment    due to  time constraints. PT IN AGREEMENT WITH PLAN TO DECREASE ot FREQUENCY FROM 2 X WEEKLY TO 1 TIME WEEKLY .    OT Occupational Profile and History Comprehensive Assessment- Review of records and extensive additional review of physical, cognitive, psychosocial history related to current functional performance    Occupational performance deficits (Please refer to evaluation for details): ADL's;Leisure;Social Participation;IADL's;Work    Body Structure / Function / Physical Skills ADL;Decreased knowledge of precautions;Flexibility;ROM;Mobility;Decreased knowledge of use of DME;Balance;Skin integrity;Wound;Edema;Pain;IADL    Rehab Potential Good    Clinical Decision Making Several treatment options, min-mod task modification necessary    Comorbidities Affecting Occupational Performance: Presence of comorbidities impacting occupational performance    Comorbidities impacting occupational performance description: see SUBJECTIVE for contibuting comorbidities    Modification or Assistance to Complete Evaluation  Max significant modification of tasks or assist is necessary to complete    OT Frequency 2x / week   2-3 x weekly x 12 weeks and prn   OT Duration 12 weeks   and PRN   OT Treatment/Interventions Self-care/ADL training;Therapeutic exercise;Manual lymph drainage;Compression bandaging;Patient/family education;Other (comment);Therapeutic activities;Manual Therapy;DME and/or AE instruction   SKIN CARE   Plan CDT: MLD, gradient wraps, ther ex, skin care,    OT Home Exercise Plan  Caregiver to apply compression wraps daily during visit intervals and assist with donning / doffing compression consistently    Recommended Other Services Consider Velcro-style, adjustable , knee length Jobst, CircAid cmpression wraps as an alternative to traditional elastic stockings for ease of donning    Consulted and Agree with Plan of Care Patient           Patient will benefit from skilled therapeutic intervention in order to improve the following deficits and impairments:   Body Structure / Function / Physical Skills: ADL, Decreased knowledge of precautions, Flexibility, ROM, Mobility, Decreased knowledge of use of DME, Balance, Skin integrity, Wound, Edema, Pain, IADL       Visit Diagnosis: Lymphedema, not elsewhere classified    Problem List Patient Active Problem List   Diagnosis Date Noted  . GERD (gastroesophageal reflux disease) 01/20/2019  . Hyperlipidemia 01/20/2019  . Claudication ([place]) 01/07/2019  . Venous ulcer of left leg ([place]) 08/13/2018  . Diabetic polyneuropathy associated with type 2 diabetes mellitus ([place]) 05/30/2018  . Macrocytosis 04/26/2018  . Fecal occult blood test positive 10/08/2017  . IDA (iron deficiency anemia) 10/08/2017  . Chronic venous insufficiency 12/25/2016  . Lymphedema 12/25/2016  . Diabetes ([place]) 12/25/2016  . Essential hypertension 12/25/2016  . Hx of adenomatous colonic polyps 10/22/2016  . DOE (dyspnea on exertion) 07/22/2016  . CKD (chronic kidney disease) stage 3, GFR 30-59 ml/min 06/23/2016  . Swelling of limb 12/31/2015  . Severe obesity (BMI >= 40) ([place]) 12/31/2015  . Varicose veins of both legs with edema 12/31/2015  . VV (varicose veins) 11/27/2015  . Other iron deficiency anemia 10/08/2015  . Anemia due to stage 3 chronic kidney disease 10/08/2015  . BMI 45.0-49.9, adult ([place]) 10/05/2015  . OSA on CPAP 03/10/2015  . Lumbar stenosis with neurogenic claudication 09/30/2011    Andrey Spearman, MS, OTR/L,  [facility] 10/19/19 12:52 PM   [place] MAIN [facility] SERVICES [address] [place], [place], [address] Phone: [phone]   Fax:  [phone]  Name: JIRAH RIDER MRN: [id number] Date of Birth: January 31, 1941

## 2019-10-23 ENCOUNTER — Ambulatory Visit: Payer: Medicare HMO | Admitting: Occupational Therapy

## 2019-10-23 ENCOUNTER — Other Ambulatory Visit: Payer: Self-pay

## 2019-10-23 DIAGNOSIS — I890 Lymphedema, not elsewhere classified: Secondary | ICD-10-CM | POA: Diagnosis not present

## 2019-10-23 NOTE — Therapy (Signed)
[place] MAIN [facility] SERVICES [address] [place], [place], [address] Phone: [phone]   Fax:  [phone]  Occupational Therapy Treatment Note & Progress Report: Lymphedema Care  Patient Details  Name: Sara Bright MRN: [id number] Date of Birth: Sep 19, 1940 Referring Provider (OT): Eulogio Ditch, [place]   Encounter Date: 10/23/2019   OT End of Session - 10/23/19 1246    Visit Number 30    Number of Visits 36    Date for OT Re-Evaluation 12/12/19    OT Start Time 1055    OT Stop Time 1205    OT Time Calculation (min) 70 min    Equipment Utilized During Treatment friction gloves, Tyvek sock    Activity Tolerance Patient tolerated treatment well;No increased pain    Behavior During Therapy WFL for tasks assessed/performed           Past Medical History:  Diagnosis Date  . Adenoma of colon   . Adenomatous colon polyp   . Anemia    IDA  . Cataract   . Cervical stenosis of spinal canal   . Chronic kidney disease    stage 3  . Diabetes mellitus without complication ([place])   . GERD (gastroesophageal reflux disease)   . Glaucoma   . Heme positive stool   . History of UTI   . Hx of gallstones   . Hyperkalemia   . Hyperlipidemia   . Hyperlipidemia   . Hypertension   . Lumbar stenosis   . Lymphedema   . Neuromuscular disorder ([place])   . Neuropathy associated with endocrine disorder ([place])   . Obesity   . OSA on CPAP    on CPAP  . Osteoarthritis   . Osteoarthritis   . Port-A-Cath in place   . Retinopathy due to secondary diabetes ([place])   . Secondary hyperparathyroidism of renal origin [facility])     Past Surgical History:  Procedure Laterality Date  . ACDF X2    . BACK SURGERY    . Cataract extraction right    . catarect extraction left    . CHOLECYSTECTOMY    . COLONOSCOPY WITH PROPOFOL N/A 12/02/2016   Procedure: COLONOSCOPY WITH PROPOFOL;  Surgeon: Manya Silvas, MD;  Location: [facility] ENDOSCOPY;  Service: Endoscopy;   Laterality: N/A;  . COLONOSCOPY WITH PROPOFOL N/A 11/10/2017   Procedure: COLONOSCOPY WITH PROPOFOL;  Surgeon: Toledo, Benay Pike, MD;  Location: [facility] ENDOSCOPY;  Service: Gastroenterology;  Laterality: N/A;  . colonoscopy with removal lesions by snare    . Endoscoic carpal tunnel release    . ESOPHAGOGASTRODUODENOSCOPY N/A 11/10/2017   Procedure: ESOPHAGOGASTRODUODENOSCOPY (EGD);  Surgeon: Toledo, Benay Pike, MD;  Location: [facility] ENDOSCOPY;  Service: Gastroenterology;  Laterality: N/A;  . ESOPHAGOGASTRODUODENOSCOPY (EGD) WITH PROPOFOL N/A 12/02/2016   Procedure: ESOPHAGOGASTRODUODENOSCOPY (EGD) WITH PROPOFOL;  Surgeon: Manya Silvas, MD;  Location: [facility] ENDOSCOPY;  Service: Endoscopy;  Laterality: N/A;  . IR IMAGING GUIDED PORT INSERTION  10/01/2017  . Laminectomy posterior lumbar facetectomy and formaninotomy w/Decomp    . Laminectony posterior cervicle decomp w/Facectomy and foraminotomy    . VEIN LIGATION AND STRIPPING Left     There were no vitals filed for this visit.   Subjective Assessment - 10/23/19 1058    Subjective  Sara Bright presents for OT visit  30/36 to address BLE lymphedema. Pt denies leg pain this morning. Pt reports she is able to don knee highs with a bit less effort and in less time.    Pertinent  History Chronic leg swelling and associated pain > 20 years; , Hx recurrent  ulcers, hx of lymphorrhea, Contributing factors include CKD, stage 3, HTN, Obesity, OSA (has cpap), OA. Other pertinent medical hx include B neuropathy hands and feet, hx vein ligation and stripping s/p 5-6 yrs, cervicle and lumbar laminectomies, glaucoma and diabetic retinopathy    Limitations BLE weakness, impaired balance, impaired sensation hands and feet, impaired vision, increased falls risk, decreased hip AROM, claudication    Repetition Increases Symptoms    Special Tests +Stemmer base of toes bilaterally    Patient Stated Goals Want to stop these leg ulcers from returning, Get leg swelling better  under control and keep symptoms from getting worse.    Pain Onset Other (comment)   >20 YEARS              LYMPHEDEMA/ONCOLOGY QUESTIONNAIRE - 10/23/19 0001      Right Lower Extremity Lymphedema   Other LLE A-D ( ankle to knee) limb volume measures 4271.1 ml.    Other LLE limb volume is DEcreased by  9.18% since we commenced CDT on 06/13/19.                   OT Treatments/Exercises (OP) - 10/23/19 0001      ADLs   ADL Education Given Yes      Manual Therapy   Manual Therapy Edema management;Manual Lymphatic Drainage (MLD);Compression Bandaging    Manual Lymphatic Drainage (MLD) MLD to RLE as established. Fibrosis techniques to dense fibrosis at L leg b and ankle.     Compression Bandaging Max A to don R compression stocking  to optimize manual Rx time                  OT Education - 10/23/19 1245    Education Details Pt edu re progress towqards all ot GOALS. Pt edu re transition from Intensive Phase to self-management phase CDT. F/U plan and typical compression garment replacement schedule. Demonstrated ALPS fitting lotion and Jobst It Stays    Person(s) Educated Patient    Methods Explanation;Demonstration;Handout;Tactile cues;Verbal cues    Comprehension Verbalized understanding;Returned demonstration;Need further instruction;Verbal cues required;Tactile cues required;Other (comment)   loaned equipment for practive over visit interval              OT Long Term Goals - 10/23/19 1250      OT LONG TERM GOAL #1   Title Pt will be able to apply BLE, knee length, multi-layer, short stretch compression wraps daily to one leg at a time using correct gradient techniques with MAXIMUM CAREGIVER ASSISTANCE   to achieve optimal limb volume reduction, to return affected limb/s, as closely as possible, to premorbid size and shape, to limit infection risk, and to improve safe functional ambulation and mobility.    Baseline Dependent    Time 4    Period Days     Status Achieved      OT LONG TERM GOAL #2   Title Pt will be able to verbalize signs and symptoms of cellulitis infection and identify at least 4 common lymphedema precautions using printed resource for reference to limit LE progression over time to limit risk of infection and LE exacerbation.    Baseline Max A    Time 4    Period Days    Status Achieved      OT LONG TERM GOAL #3   Title Pt will tolerate knee length,  multilayer short stretch compression wrap to single leg  up to 23/ 7 to achieve optimal swelling reduction to arrest lymphedema progression and in preparation for fitting appropriate compression garments/ devices.    Baseline Max A    Time 6    Period Days    Status Achieved      OT LONG TERM GOAL #4   Title With max CG support daily for compression wrapping  Pt will achieve and sustain at least  85%  compliance with daily LE self-care home program (skin care, lymphatic pumping therex, compression and simple self-MLD) to reduce and control limb swelling, reduce infection risk and limit LE progression.    Baseline Dependent    Time 12    Period Weeks    Status Achieved      OT LONG TERM GOAL #5   Title Pt will achieve at least 10% limb volume reduction in RLE and 5% reduction in LLE belowthe knees to reduce recurrent leg ulcers, reduce infection risk and limit LE progression.   Good progress. 5.53% reduction measured 07/17/19   Baseline dependent    Time 12    Period Weeks    Status Partially Met                 Plan - 10/23/19 1253    Clinical Impression Statement sEE rX NOTE 10/19/19 FOR dETAILED PROGRESS TOWARDS GOALS. Pt Pts skills for donning and doffing custom compression garments are improving based on her report that she's able to take off and put on garments using assistive devices in less time with a litle less effort. Now that she is sure that she is able to be independent with this activity she is accepting assistance from family members more readily.  Limb swelling appears well managed bilaterally    with decreased OT frequency Garments fot well and contain swelling at current configuration. Cont 1 x weekly for next 2 weeks, and if limb volumes atable we'll shift to follow-up frequency and transition to self management CDT.    OT Occupational Profile and History Comprehensive Assessment- Review of records and extensive additional review of physical, cognitive, psychosocial history related to current functional performance    Occupational performance deficits (Please refer to evaluation for details): ADL's;Leisure;Social Participation;IADL's;Work    Body Structure / Function / Physical Skills ADL;Decreased knowledge of precautions;Flexibility;ROM;Mobility;Decreased knowledge of use of DME;Balance;Skin integrity;Wound;Edema;Pain;IADL    Rehab Potential Good    Clinical Decision Making Several treatment options, min-mod task modification necessary    Comorbidities Affecting Occupational Performance: Presence of comorbidities impacting occupational performance    Comorbidities impacting occupational performance description: see SUBJECTIVE for contibuting comorbidities    Modification or Assistance to Complete Evaluation  Max significant modification of tasks or assist is necessary to complete    OT Frequency 2x / week   2-3 x weekly x 12 weeks and prn   OT Duration 12 weeks   and PRN   OT Treatment/Interventions Self-care/ADL training;Therapeutic exercise;Manual lymph drainage;Compression bandaging;Patient/family education;Other (comment);Therapeutic activities;Manual Therapy;DME and/or AE instruction   SKIN CARE   Plan CDT: MLD, gradient wraps, ther ex, skin care,    OT Home Exercise Plan Caregiver to apply compression wraps daily during visit intervals and assist with donning / doffing compression consistently    Recommended Other Services Consider Velcro-style, adjustable , knee length Jobst, CircAid cmpression wraps as an alternative to traditional  elastic stockings for ease of donning    Consulted and Agree with Plan of Care Patient           Patient will  benefit from skilled therapeutic intervention in order to improve the following deficits and impairments:   Body Structure / Function / Physical Skills: ADL, Decreased knowledge of precautions, Flexibility, ROM, Mobility, Decreased knowledge of use of DME, Balance, Skin integrity, Wound, Edema, Pain, IADL       Visit Diagnosis: Lymphedema, not elsewhere classified    Problem List Patient Active Problem List   Diagnosis Date Noted  . GERD (gastroesophageal reflux disease) 01/20/2019  . Hyperlipidemia 01/20/2019  . Claudication ([place]) 01/07/2019  . Venous ulcer of left leg ([place]) 08/13/2018  . Diabetic polyneuropathy associated with type 2 diabetes mellitus ([place]) 05/30/2018  . Macrocytosis 04/26/2018  . Fecal occult blood test positive 10/08/2017  . IDA (iron deficiency anemia) 10/08/2017  . Chronic venous insufficiency 12/25/2016  . Lymphedema 12/25/2016  . Diabetes ([place]) 12/25/2016  . Essential hypertension 12/25/2016  . Hx of adenomatous colonic polyps 10/22/2016  . DOE (dyspnea on exertion) 07/22/2016  . CKD (chronic kidney disease) stage 3, GFR 30-59 ml/min 06/23/2016  . Swelling of limb 12/31/2015  . Severe obesity (BMI >= 40) ([place]) 12/31/2015  . Varicose veins of both legs with edema 12/31/2015  . VV (varicose veins) 11/27/2015  . Other iron deficiency anemia 10/08/2015  . Anemia due to stage 3 chronic kidney disease 10/08/2015  . BMI 45.0-49.9, adult ([place]) 10/05/2015  . OSA on CPAP 03/10/2015  . Lumbar stenosis with neurogenic claudication 09/30/2011    Andrey Spearman, MS, OTR/L, [facility] 10/23/19 1:00 PM   [place] MAIN [facility] SERVICES [address] [place], [place], [address] Phone: [phone]   Fax:  [phone]  Name: ALEXYA MCDARIS MRN: [id number] Date of Birth: 12/25/1940

## 2019-10-24 ENCOUNTER — Other Ambulatory Visit: Payer: Self-pay

## 2019-10-24 ENCOUNTER — Inpatient Hospital Stay: Payer: Medicare HMO

## 2019-10-24 VITALS — BP 144/54 | HR 71 | Temp 98.0°F | Resp 18

## 2019-10-24 DIAGNOSIS — D631 Anemia in chronic kidney disease: Secondary | ICD-10-CM

## 2019-10-24 DIAGNOSIS — N183 Anemia in chronic kidney disease: Secondary | ICD-10-CM

## 2019-10-24 DIAGNOSIS — I129 Hypertensive chronic kidney disease with stage 1 through stage 4 chronic kidney disease, or unspecified chronic kidney disease: Secondary | ICD-10-CM | POA: Diagnosis not present

## 2019-10-24 DIAGNOSIS — D7589 Other specified diseases of blood and blood-forming organs: Secondary | ICD-10-CM

## 2019-10-24 DIAGNOSIS — N1832 Chronic kidney disease, stage 3b: Secondary | ICD-10-CM

## 2019-10-24 LAB — HEMATOCRIT: HCT: 29.5 % — ABNORMAL LOW (ref 36.0–46.0)

## 2019-10-24 LAB — HEMOGLOBIN: Hemoglobin: 9.2 g/dL — ABNORMAL LOW (ref 12.0–15.0)

## 2019-10-24 MED ORDER — EPOETIN ALFA-EPBX 10000 UNIT/ML IJ SOLN
10000.0000 [IU] | Freq: Once | INTRAMUSCULAR | Status: AC
  Administered 2019-10-24: 10000 [IU] via SUBCUTANEOUS

## 2019-10-26 ENCOUNTER — Encounter: Payer: Medicare HMO | Admitting: Occupational Therapy

## 2019-10-30 ENCOUNTER — Ambulatory Visit: Payer: Medicare HMO | Admitting: Occupational Therapy

## 2019-10-30 ENCOUNTER — Other Ambulatory Visit: Payer: Self-pay

## 2019-10-30 DIAGNOSIS — I890 Lymphedema, not elsewhere classified: Secondary | ICD-10-CM | POA: Diagnosis not present

## 2019-10-30 NOTE — Therapy (Signed)
[place] MAIN [facility] SERVICES [address] [place], [place], [address] Phone: [phone]   Fax:  [phone]  Occupational Therapy Treatment  Patient Details  Name: Sara Bright MRN: [id number] Date of Birth: Jan 13, 1941 Referring Provider (OT): Eulogio Ditch, [place]   Encounter Date: 10/30/2019   OT End of Session - 10/30/19 1301    Visit Number 31    Number of Visits 36    Date for OT Re-Evaluation 12/12/19    OT Start Time 1110    OT Stop Time 1221    OT Time Calculation (min) 71 min    Equipment Utilized During Treatment friction gloves, Tyvek sock    Activity Tolerance Patient tolerated treatment well;No increased pain    Behavior During Therapy WFL for tasks assessed/performed           Past Medical History:  Diagnosis Date  . Adenoma of colon   . Adenomatous colon polyp   . Anemia    IDA  . Cataract   . Cervical stenosis of spinal canal   . Chronic kidney disease    stage 3  . Diabetes mellitus without complication ([place])   . GERD (gastroesophageal reflux disease)   . Glaucoma   . Heme positive stool   . History of UTI   . Hx of gallstones   . Hyperkalemia   . Hyperlipidemia   . Hyperlipidemia   . Hypertension   . Lumbar stenosis   . Lymphedema   . Neuromuscular disorder ([place])   . Neuropathy associated with endocrine disorder ([place])   . Obesity   . OSA on CPAP    on CPAP  . Osteoarthritis   . Osteoarthritis   . Port-A-Cath in place   . Retinopathy due to secondary diabetes ([place])   . Secondary hyperparathyroidism of renal origin [facility])     Past Surgical History:  Procedure Laterality Date  . ACDF X2    . BACK SURGERY    . Cataract extraction right    . catarect extraction left    . CHOLECYSTECTOMY    . COLONOSCOPY WITH PROPOFOL N/A 12/02/2016   Procedure: COLONOSCOPY WITH PROPOFOL;  Surgeon: Manya Silvas, MD;  Location: [facility] ENDOSCOPY;  Service: Endoscopy;  Laterality: N/A;  . COLONOSCOPY WITH  PROPOFOL N/A 11/10/2017   Procedure: COLONOSCOPY WITH PROPOFOL;  Surgeon: Toledo, Benay Pike, MD;  Location: [facility] ENDOSCOPY;  Service: Gastroenterology;  Laterality: N/A;  . colonoscopy with removal lesions by snare    . Endoscoic carpal tunnel release    . ESOPHAGOGASTRODUODENOSCOPY N/A 11/10/2017   Procedure: ESOPHAGOGASTRODUODENOSCOPY (EGD);  Surgeon: Toledo, Benay Pike, MD;  Location: [facility] ENDOSCOPY;  Service: Gastroenterology;  Laterality: N/A;  . ESOPHAGOGASTRODUODENOSCOPY (EGD) WITH PROPOFOL N/A 12/02/2016   Procedure: ESOPHAGOGASTRODUODENOSCOPY (EGD) WITH PROPOFOL;  Surgeon: Manya Silvas, MD;  Location: [facility] ENDOSCOPY;  Service: Endoscopy;  Laterality: N/A;  . IR IMAGING GUIDED PORT INSERTION  10/01/2017  . Laminectomy posterior lumbar facetectomy and formaninotomy w/Decomp    . Laminectony posterior cervicle decomp w/Facectomy and foraminotomy    . VEIN LIGATION AND STRIPPING Left     There were no vitals filed for this visit.   Subjective Assessment - 10/30/19 1300    Subjective  Sara Bright presents for OT visit  31/36 to address BLE lymphedema. Pt presents with BLE custom compression stockings in place. denies leg pain this morning.    Pertinent History Chronic leg swelling and associated pain > 20 years; , Hx recurrent  ulcers, hx  of lymphorrhea, Contributing factors include CKD, stage 3, HTN, Obesity, OSA (has cpap), OA. Other pertinent medical hx include B neuropathy hands and feet, hx vein ligation and stripping s/p 5-6 yrs, cervicle and lumbar laminectomies, glaucoma and diabetic retinopathy    Limitations BLE weakness, impaired balance, impaired sensation hands and feet, impaired vision, increased falls risk, decreased hip AROM, claudication    Repetition Increases Symptoms    Special Tests +Stemmer base of toes bilaterally    Patient Stated Goals Want to stop these leg ulcers from returning, Get leg swelling better under control and keep symptoms from getting worse.    Pain  Onset Other (comment)   >20 YEARS                               OT Education - 10/30/19 1301    Education Details Continued skilled Pt/caregiver education  And LE ADL training throughout visit for lymphedema self care/ home program, including compression wrapping, compression garment and device wear/care, lymphatic pumping ther ex, simple self-MLD, and skin care. Discussed progress towards goals.    Person(s) Educated Patient    Methods Explanation;Demonstration;Handout;Tactile cues;Verbal cues    Comprehension Verbalized understanding;Returned demonstration;Need further instruction;Verbal cues required;Tactile cues required;Other (comment)   loaned equipment for practive over visit interval              OT Long Term Goals - 10/23/19 1250      OT LONG TERM GOAL #1   Title Pt will be able to apply BLE, knee length, multi-layer, short stretch compression wraps daily to one leg at a time using correct gradient techniques with MAXIMUM CAREGIVER ASSISTANCE   to achieve optimal limb volume reduction, to return affected limb/s, as closely as possible, to premorbid size and shape, to limit infection risk, and to improve safe functional ambulation and mobility.    Baseline Dependent    Time 4    Period Days    Status Achieved      OT LONG TERM GOAL #2   Title Pt will be able to verbalize signs and symptoms of cellulitis infection and identify at least 4 common lymphedema precautions using printed resource for reference to limit LE progression over time to limit risk of infection and LE exacerbation.    Baseline Max A    Time 4    Period Days    Status Achieved      OT LONG TERM GOAL #3   Title Pt will tolerate knee length,  multilayer short stretch compression wrap to single leg up to 23/ 7 to achieve optimal swelling reduction to arrest lymphedema progression and in preparation for fitting appropriate compression garments/ devices.    Baseline Max A    Time 6     Period Days    Status Achieved      OT LONG TERM GOAL #4   Title With max CG support daily for compression wrapping  Pt will achieve and sustain at least  85%  compliance with daily LE self-care home program (skin care, lymphatic pumping therex, compression and simple self-MLD) to reduce and control limb swelling, reduce infection risk and limit LE progression.    Baseline Dependent    Time 12    Period Weeks    Status Achieved      OT LONG TERM GOAL #5   Title Pt will achieve at least 10% limb volume reduction in RLE and 5% reduction in LLE belowthe  knees to reduce recurrent leg ulcers, reduce infection risk and limit LE progression.   Good progress. 5.53% reduction measured 07/17/19   Baseline dependent    Time 12    Period Weeks    Status Partially Met                 Plan - 10/30/19 1302    Clinical Impression Statement BLE swelling well managed. Skin in excellent condition below the knees bilaterally. Dense fibrosis reduced greatly but persists at lesser degfree at distal legs and malleolii. Pt tollerated MLD to LLE today without increased pain. Max A to dont stockking to conserve Rx time. Last vivit of Intensive Phase CDT next session. Cont at F/U frequency for 6 months and then PRN.    OT Occupational Profile and History Comprehensive Assessment- Review of records and extensive additional review of physical, cognitive, psychosocial history related to current functional performance    Occupational performance deficits (Please refer to evaluation for details): ADL's;Leisure;Social Participation;IADL's;Work    Body Structure / Function / Physical Skills ADL;Decreased knowledge of precautions;Flexibility;ROM;Mobility;Decreased knowledge of use of DME;Balance;Skin integrity;Wound;Edema;Pain;IADL    Rehab Potential Good    Clinical Decision Making Several treatment options, min-mod task modification necessary    Comorbidities Affecting Occupational Performance: Presence of  comorbidities impacting occupational performance    Comorbidities impacting occupational performance description: see SUBJECTIVE for contibuting comorbidities    Modification or Assistance to Complete Evaluation  Max significant modification of tasks or assist is necessary to complete    OT Frequency 2x / week   2-3 x weekly x 12 weeks and prn   OT Duration 12 weeks   and PRN   OT Treatment/Interventions Self-care/ADL training;Therapeutic exercise;Manual lymph drainage;Compression bandaging;Patient/family education;Other (comment);Therapeutic activities;Manual Therapy;DME and/or AE instruction   SKIN CARE   Plan CDT: MLD, gradient wraps, ther ex, skin care,    OT Home Exercise Plan Caregiver to apply compression wraps daily during visit intervals and assist with donning / doffing compression consistently    Recommended Other Services Consider Velcro-style, adjustable , knee length Jobst, CircAid cmpression wraps as an alternative to traditional elastic stockings for ease of donning    Consulted and Agree with Plan of Care Patient           Patient will benefit from skilled therapeutic intervention in order to improve the following deficits and impairments:   Body Structure / Function / Physical Skills: ADL, Decreased knowledge of precautions, Flexibility, ROM, Mobility, Decreased knowledge of use of DME, Balance, Skin integrity, Wound, Edema, Pain, IADL       Visit Diagnosis: Lymphedema, not elsewhere classified    Problem List Patient Active Problem List   Diagnosis Date Noted  . GERD (gastroesophageal reflux disease) 01/20/2019  . Hyperlipidemia 01/20/2019  . Claudication ([place]) 01/07/2019  . Venous ulcer of left leg ([place]) 08/13/2018  . Diabetic polyneuropathy associated with type 2 diabetes mellitus ([place]) 05/30/2018  . Macrocytosis 04/26/2018  . Fecal occult blood test positive 10/08/2017  . IDA (iron deficiency anemia) 10/08/2017  . Chronic venous insufficiency 12/25/2016  .  Lymphedema 12/25/2016  . Diabetes ([place]) 12/25/2016  . Essential hypertension 12/25/2016  . Hx of adenomatous colonic polyps 10/22/2016  . DOE (dyspnea on exertion) 07/22/2016  . CKD (chronic kidney disease) stage 3, GFR 30-59 ml/min 06/23/2016  . Swelling of limb 12/31/2015  . Severe obesity (BMI >= 40) ([place]) 12/31/2015  . Varicose veins of both legs with edema 12/31/2015  . VV (varicose veins) 11/27/2015  . Other iron  deficiency anemia 10/08/2015  . Anemia due to stage 3 chronic kidney disease 10/08/2015  . BMI 45.0-49.9, adult ([place]) 10/05/2015  . OSA on CPAP 03/10/2015  . Lumbar stenosis with neurogenic claudication 09/30/2011    Andrey Spearman, MS, OTR/L, [facility] 10/30/19 3:12 PM  [place] MAIN [facility] SERVICES [address] [place], [place], [address] Phone: [phone]   Fax:  [phone]  Name: Sara Bright MRN: [id number] Date of Birth: [birth year]

## 2019-11-02 ENCOUNTER — Encounter: Payer: Medicare HMO | Admitting: Occupational Therapy

## 2019-11-06 ENCOUNTER — Other Ambulatory Visit: Payer: Self-pay

## 2019-11-06 ENCOUNTER — Ambulatory Visit: Payer: Medicare HMO | Admitting: Occupational Therapy

## 2019-11-06 DIAGNOSIS — I890 Lymphedema, not elsewhere classified: Secondary | ICD-10-CM | POA: Diagnosis not present

## 2019-11-06 NOTE — Therapy (Signed)
[place] MAIN [facility] SERVICES [address] [place], [place], [address] Phone: [phone]   Fax:  [phone]  Occupational Therapy Treatment  Patient Details  Name: Sara Bright MRN: [id number] Date of Birth: Jul 17, 1940 Referring Provider (OT): Eulogio Ditch, [place]   Encounter Date: 11/06/2019   OT End of Session - 11/06/19 1240    Visit Number 32    Number of Visits 36    Date for OT Re-Evaluation 12/12/19    OT Start Time 1105    OT Stop Time 1210    OT Time Calculation (min) 65 min    Equipment Utilized During Treatment friction gloves, Tyvek sock    Activity Tolerance Patient tolerated treatment well;No increased pain    Behavior During Therapy WFL for tasks assessed/performed           Past Medical History:  Diagnosis Date  . Adenoma of colon   . Adenomatous colon polyp   . Anemia    IDA  . Cataract   . Cervical stenosis of spinal canal   . Chronic kidney disease    stage 3  . Diabetes mellitus without complication ([place])   . GERD (gastroesophageal reflux disease)   . Glaucoma   . Heme positive stool   . History of UTI   . Hx of gallstones   . Hyperkalemia   . Hyperlipidemia   . Hyperlipidemia   . Hypertension   . Lumbar stenosis   . Lymphedema   . Neuromuscular disorder ([place])   . Neuropathy associated with endocrine disorder ([place])   . Obesity   . OSA on CPAP    on CPAP  . Osteoarthritis   . Osteoarthritis   . Port-A-Cath in place   . Retinopathy due to secondary diabetes ([place])   . Secondary hyperparathyroidism of renal origin [facility])     Past Surgical History:  Procedure Laterality Date  . ACDF X2    . BACK SURGERY    . Cataract extraction right    . catarect extraction left    . CHOLECYSTECTOMY    . COLONOSCOPY WITH PROPOFOL N/A 12/02/2016   Procedure: COLONOSCOPY WITH PROPOFOL;  Surgeon: Manya Silvas, MD;  Location: [facility] ENDOSCOPY;  Service: Endoscopy;  Laterality: N/A;  . COLONOSCOPY WITH  PROPOFOL N/A 11/10/2017   Procedure: COLONOSCOPY WITH PROPOFOL;  Surgeon: Toledo, Benay Pike, MD;  Location: [facility] ENDOSCOPY;  Service: Gastroenterology;  Laterality: N/A;  . colonoscopy with removal lesions by snare    . Endoscoic carpal tunnel release    . ESOPHAGOGASTRODUODENOSCOPY N/A 11/10/2017   Procedure: ESOPHAGOGASTRODUODENOSCOPY (EGD);  Surgeon: Toledo, Benay Pike, MD;  Location: [facility] ENDOSCOPY;  Service: Gastroenterology;  Laterality: N/A;  . ESOPHAGOGASTRODUODENOSCOPY (EGD) WITH PROPOFOL N/A 12/02/2016   Procedure: ESOPHAGOGASTRODUODENOSCOPY (EGD) WITH PROPOFOL;  Surgeon: Manya Silvas, MD;  Location: [facility] ENDOSCOPY;  Service: Endoscopy;  Laterality: N/A;  . IR IMAGING GUIDED PORT INSERTION  10/01/2017  . Laminectomy posterior lumbar facetectomy and formaninotomy w/Decomp    . Laminectony posterior cervicle decomp w/Facectomy and foraminotomy    . VEIN LIGATION AND STRIPPING Left     There were no vitals filed for this visit.   Subjective Assessment - 11/06/19 1233    Subjective  Sara Bright presents for OT visit  32/36, her last scheduled vivit,  to address BLE lymphedema. Pt presents with BLE custom compression stockings in place. De denies leg pain this morning.    Pertinent History Chronic leg swelling and associated pain > 20 years; ,  Hx recurrent  ulcers, hx of lymphorrhea, Contributing factors include CKD, stage 3, HTN, Obesity, OSA (has cpap), OA. Other pertinent medical hx include B neuropathy hands and feet, hx vein ligation and stripping s/p 5-6 yrs, cervicle and lumbar laminectomies, glaucoma and diabetic retinopathy    Limitations BLE weakness, impaired balance, impaired sensation hands and feet, impaired vision, increased falls risk, decreased hip AROM, claudication    Repetition Increases Symptoms    Special Tests +Stemmer base of toes bilaterally    Patient Stated Goals Want to stop these leg ulcers from returning, Get leg swelling better under control and keep symptoms  from getting worse.    Pain Onset Other (comment)   >20 YEARS                       OT Treatments/Exercises (OP) - 11/06/19 0001      ADLs   ADL Education Given Yes      Manual Therapy   Manual Therapy Edema management;Manual Lymphatic Drainage (MLD);Compression Bandaging    Manual therapy comments completed fitting and assessment of 2nd pair of ccl 2 custom Mediven MONDI compression stockings.    Manual Lymphatic Drainage (MLD) MLD to LLE as established. Fibrosis techniques to dense fibrosis at L leg b and ankle.     Compression Bandaging Max A to don R compression stocking  to optimize manual Rx time                  OT Education - 11/06/19 1236    Education Details Reviewed LE precautions briefly, including signs and symptoms of cellulitis. Reviewed garment replacement schedule. Reviewed home program components and importance of LE self care daily for sustaining clinical and functional gains. Agreed on 1 month f/u.    Person(s) Educated Patient    Methods Explanation;Demonstration;Handout    Comprehension Verbalized understanding;Returned demonstration               OT Long Term Goals - 11/06/19 1240      OT LONG TERM GOAL #1   Title Pt will be able to apply BLE, knee length, multi-layer, short stretch compression wraps daily to one leg at a time using correct gradient techniques with MAXIMUM CAREGIVER ASSISTANCE   to achieve optimal limb volume reduction, to return affected limb/s, as closely as possible, to premorbid size and shape, to limit infection risk, and to improve safe functional ambulation and mobility.    Baseline Dependent    Time 4    Period Days    Status Achieved      OT LONG TERM GOAL #2   Title Pt will be able to verbalize signs and symptoms of cellulitis infection and identify at least 4 common lymphedema precautions using printed resource for reference to limit LE progression over time to limit risk of infection and LE  exacerbation.    Baseline Max A    Time 4    Period Days    Status Achieved      OT LONG TERM GOAL #3   Title Pt will tolerate knee length,  multilayer short stretch compression wrap to single leg up to 23/ 7 to achieve optimal swelling reduction to arrest lymphedema progression and in preparation for fitting appropriate compression garments/ devices.    Baseline Max A    Time 6    Period Days    Status Achieved      OT LONG TERM GOAL #4   Title With max CG support daily  for compression wrapping  Pt will achieve and sustain at least  85%  compliance with daily LE self-care home program (skin care, lymphatic pumping therex, compression and simple self-MLD) to reduce and control limb swelling, reduce infection risk and limit LE progression.    Baseline Dependent    Time 12    Period Weeks    Status Achieved      OT LONG TERM GOAL #5   Title Pt will achieve at least 10% limb volume reduction in RLE and 5% reduction in LLE belowthe knees to reduce recurrent leg ulcers, reduce infection risk and limit LE progression.   Good progress. 5.53% reduction measured 07/17/19   Baseline dependent    Time 12    Period Weeks    Status Partially Met                 Plan - 11/06/19 1241    Clinical Impression Statement Second pair , ("re-order of initial pair already fitted) of custom Mediven MONDI ccl 2 knee highs fit well and are comfortable by Pt report. Pt is able to don and doff with extra time using assistive devices with modified independence. Skin is exceedingly well managed. Hydration is WNL, skin is mobile and scar tisue is less pronounced. Pt denies leg pain. Pt tells me she is working on increasing frequency of use of Flexitouch on single leg  ( at least 3 x weekly) as recommended. She has achieved most OT goals for LE care and transitions today from Intensive Phase CDT to self-management phase with follow-along support as LE is a chronic, progressive condition. Pt agrees with plan to  follow up with visit in 4 weeks. She will call during interval PRN.    OT Occupational Profile and History Comprehensive Assessment- Review of records and extensive additional review of physical, cognitive, psychosocial history related to current functional performance    Occupational performance deficits (Please refer to evaluation for details): ADL's;Leisure;Social Participation;IADL's;Work    Body Structure / Function / Physical Skills ADL;Decreased knowledge of precautions;Flexibility;ROM;Mobility;Decreased knowledge of use of DME;Balance;Skin integrity;Wound;Edema;Pain;IADL    Rehab Potential Good    Clinical Decision Making Several treatment options, min-mod task modification necessary    Comorbidities Affecting Occupational Performance: Presence of comorbidities impacting occupational performance    Comorbidities impacting occupational performance description: see SUBJECTIVE for contibuting comorbidities    Modification or Assistance to Complete Evaluation  Max significant modification of tasks or assist is necessary to complete    OT Frequency 2x / week   2-3 x weekly x 12 weeks and prn   OT Duration 12 weeks   and PRN   OT Treatment/Interventions Self-care/ADL training;Therapeutic exercise;Manual lymph drainage;Compression bandaging;Patient/family education;Other (comment);Therapeutic activities;Manual Therapy;DME and/or AE instruction   SKIN CARE   Plan CDT: MLD, gradient wraps, ther ex, skin care,    OT Home Exercise Plan Caregiver to apply compression wraps daily during visit intervals and assist with donning / doffing compression consistently    Recommended Other Services Consider Velcro-style, adjustable , knee length Jobst, CircAid cmpression wraps as an alternative to traditional elastic stockings for ease of donning    Consulted and Agree with Plan of Care Patient           Patient will benefit from skilled therapeutic intervention in order to improve the following deficits and  impairments:   Body Structure / Function / Physical Skills: ADL, Decreased knowledge of precautions, Flexibility, ROM, Mobility, Decreased knowledge of use of DME, Balance, Skin integrity, Wound, Edema, Pain,  IADL       Visit Diagnosis: Lymphedema, not elsewhere classified    Problem List Patient Active Problem List   Diagnosis Date Noted  . GERD (gastroesophageal reflux disease) 01/20/2019  . Hyperlipidemia 01/20/2019  . Claudication ([place]) 01/07/2019  . Venous ulcer of left leg ([place]) 08/13/2018  . Diabetic polyneuropathy associated with type 2 diabetes mellitus ([place]) 05/30/2018  . Macrocytosis 04/26/2018  . Fecal occult blood test positive 10/08/2017  . IDA (iron deficiency anemia) 10/08/2017  . Chronic venous insufficiency 12/25/2016  . Lymphedema 12/25/2016  . Diabetes ([place]) 12/25/2016  . Essential hypertension 12/25/2016  . Hx of adenomatous colonic polyps 10/22/2016  . DOE (dyspnea on exertion) 07/22/2016  . CKD (chronic kidney disease) stage 3, GFR 30-59 ml/min 06/23/2016  . Swelling of limb 12/31/2015  . Severe obesity (BMI >= 40) ([place]) 12/31/2015  . Varicose veins of both legs with edema 12/31/2015  . VV (varicose veins) 11/27/2015  . Other iron deficiency anemia 10/08/2015  . Anemia due to stage 3 chronic kidney disease 10/08/2015  . BMI 45.0-49.9, adult ([place]) 10/05/2015  . OSA on CPAP 03/10/2015  . Lumbar stenosis with neurogenic claudication 09/30/2011    Andrey Spearman, MS, OTR/L, [facility] 11/06/19 12:49 PM  [place] MAIN [facility] SERVICES [address] [place], [place], [address] Phone: [phone]   Fax:  [phone]  Name: Sara Bright MRN: [id number] Date of Birth: 07/08/1940

## 2019-11-07 ENCOUNTER — Inpatient Hospital Stay: Payer: Medicare HMO

## 2019-11-07 ENCOUNTER — Other Ambulatory Visit: Payer: Self-pay

## 2019-11-07 VITALS — BP 129/59 | HR 70 | Temp 95.8°F | Resp 18

## 2019-11-07 DIAGNOSIS — D631 Anemia in chronic kidney disease: Secondary | ICD-10-CM

## 2019-11-07 DIAGNOSIS — I129 Hypertensive chronic kidney disease with stage 1 through stage 4 chronic kidney disease, or unspecified chronic kidney disease: Secondary | ICD-10-CM | POA: Diagnosis not present

## 2019-11-07 DIAGNOSIS — N183 Chronic kidney disease, stage 3 unspecified: Secondary | ICD-10-CM

## 2019-11-07 DIAGNOSIS — N1832 Chronic kidney disease, stage 3b: Secondary | ICD-10-CM

## 2019-11-07 DIAGNOSIS — D7589 Other specified diseases of blood and blood-forming organs: Secondary | ICD-10-CM

## 2019-11-07 LAB — HEMATOCRIT: HCT: 28.8 % — ABNORMAL LOW (ref 36.0–46.0)

## 2019-11-07 LAB — HEMOGLOBIN: Hemoglobin: 9 g/dL — ABNORMAL LOW (ref 12.0–15.0)

## 2019-11-07 MED ORDER — EPOETIN ALFA-EPBX 10000 UNIT/ML IJ SOLN
10000.0000 [IU] | Freq: Once | INTRAMUSCULAR | Status: AC
  Administered 2019-11-07: 10000 [IU] via SUBCUTANEOUS

## 2019-11-09 ENCOUNTER — Encounter: Payer: Medicare HMO | Admitting: Occupational Therapy

## 2019-11-20 ENCOUNTER — Other Ambulatory Visit: Payer: Self-pay

## 2019-11-20 DIAGNOSIS — N183 Chronic kidney disease, stage 3 unspecified: Secondary | ICD-10-CM

## 2019-11-20 DIAGNOSIS — D631 Anemia in chronic kidney disease: Secondary | ICD-10-CM

## 2019-11-20 IMAGING — XA IR FLUORO GUIDE CV LINE*L*
1 series · 1 of 1 positions shown · non-contrast
Comparison: None.

INDICATION: 76-year-old with chronic kidney disease and anemia. Patient has poor
venous access and requires frequent blood draws and infusions.

EXAM:
FLUOROSCOPIC AND ULTRASOUND GUIDED PLACEMENT OF A SUBCUTANEOUS PORT

[Series 1: single · 1 of 1 slices shown]
[im 1/1]
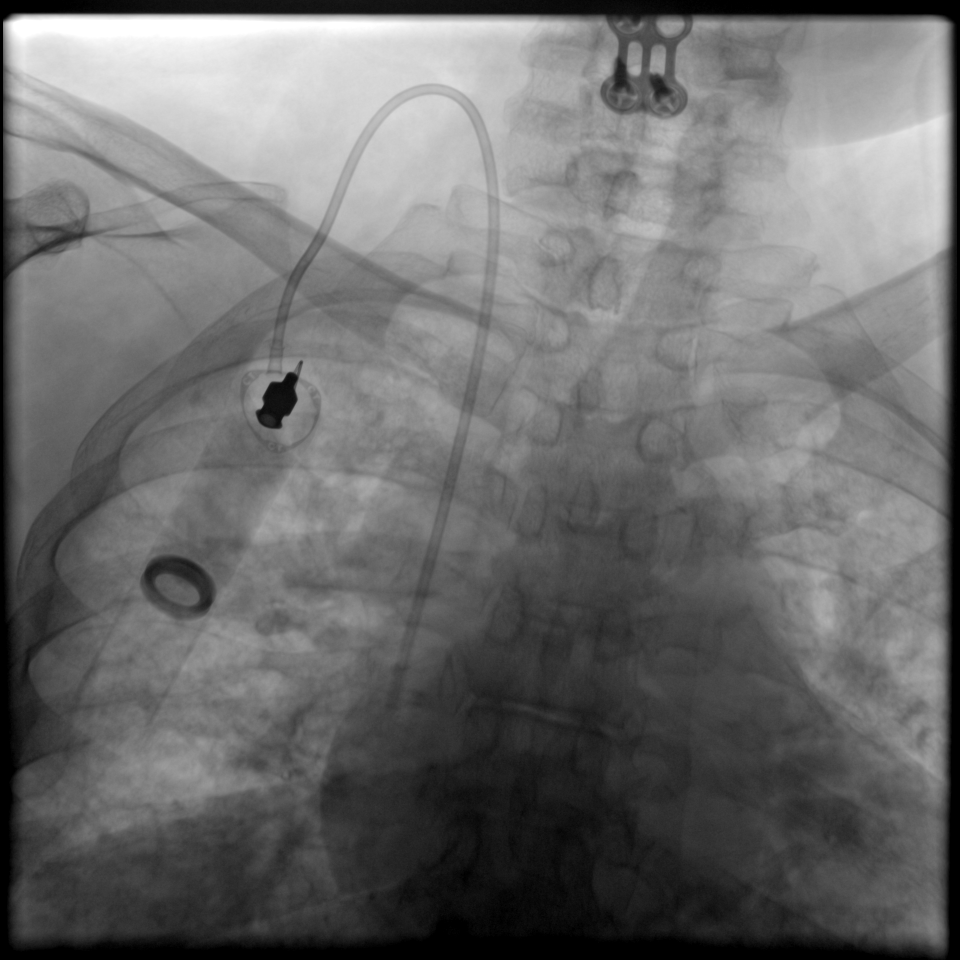

[1 of 1 positions shown; findings below may reference images not displayed]

MEDICATIONS:
Ancef 2 g; The antibiotic was administered within an appropriate
time interval prior to skin puncture.

ANESTHESIA/SEDATION:
Versed 1.5 mg IV; Fentanyl 75 mcg IV;

Moderate Sedation Time:  36 minutes

The patient was continuously monitored during the procedure by the
interventional radiology nurse under my direct supervision.

FLUOROSCOPY TIME:  30 seconds, 11 mGy

COMPLICATIONS:
None immediate.

PROCEDURE:
The procedure, risks, benefits, and alternatives were explained to
the patient. Questions regarding the procedure were encouraged and
answered. The patient understands and consents to the procedure.

Patient was placed supine on the interventional table. Ultrasound
confirmed a patent right internal jugular vein and ultrasound image
was saved for documentation. The right chest and neck were cleaned
with a skin antiseptic and a sterile drape was placed. Maximal
barrier sterile technique was utilized including caps, mask, sterile
gowns, sterile gloves, sterile drape, hand hygiene and skin
antiseptic. The right neck was anesthetized with 1% lidocaine with
epinephrine. Small incision was made in the right neck with a blade.
Micropuncture set was placed in the right internal jugular vein with
ultrasound guidance. The micropuncture wire was used for measurement
purposes. The right chest was anesthetized with 1% lidocaine with
epinephrine. #15 blade was used to make an incision and a
subcutaneous port pocket was formed. 8 french Power Port was
assembled. Subcutaneous tunnel was formed with a stiff tunneling
device. The port catheter was brought through the subcutaneous
tunnel. The port was placed in the subcutaneous pocket and sutured
in place. The micropuncture set was exchanged for a peel-away
sheath. The catheter was placed through the peel-away sheath and the
tip was positioned at the superior cavoatrial junction. Catheter
placement was confirmed with fluoroscopy. The port was accessed and
flushed with saline. The port pocket was closed using two layers of
absorbable sutures and Dermabond. The vein skin site was closed
using a single layer of absorbable suture and Dermabond. The
Port-A-Cath was accessed at the end of the procedure because the
patient was going to get an infusion later in the day. Sterile
dressings were applied. Patient tolerated the procedure well without
an immediate complication.

Ultrasound and fluoroscopic images were taken and saved for this
procedure.
FINDINGS: Catheter tip at the superior cavoatrial junction.
IMPRESSION: Placement of a subcutaneous port device.

## 2019-11-21 ENCOUNTER — Inpatient Hospital Stay: Payer: Medicare HMO | Attending: Hematology and Oncology

## 2019-11-21 ENCOUNTER — Other Ambulatory Visit: Payer: Self-pay

## 2019-11-21 ENCOUNTER — Inpatient Hospital Stay: Payer: Medicare HMO

## 2019-11-21 VITALS — BP 151/69 | HR 72 | Temp 95.9°F | Resp 18

## 2019-11-21 DIAGNOSIS — Z79899 Other long term (current) drug therapy: Secondary | ICD-10-CM | POA: Diagnosis not present

## 2019-11-21 DIAGNOSIS — Z836 Family history of other diseases of the respiratory system: Secondary | ICD-10-CM | POA: Insufficient documentation

## 2019-11-21 DIAGNOSIS — N183 Chronic kidney disease, stage 3 unspecified: Secondary | ICD-10-CM

## 2019-11-21 DIAGNOSIS — R600 Localized edema: Secondary | ICD-10-CM | POA: Diagnosis not present

## 2019-11-21 DIAGNOSIS — Z8249 Family history of ischemic heart disease and other diseases of the circulatory system: Secondary | ICD-10-CM | POA: Insufficient documentation

## 2019-11-21 DIAGNOSIS — Z841 Family history of disorders of kidney and ureter: Secondary | ICD-10-CM | POA: Insufficient documentation

## 2019-11-21 DIAGNOSIS — E1122 Type 2 diabetes mellitus with diabetic chronic kidney disease: Secondary | ICD-10-CM | POA: Insufficient documentation

## 2019-11-21 DIAGNOSIS — D7589 Other specified diseases of blood and blood-forming organs: Secondary | ICD-10-CM | POA: Diagnosis not present

## 2019-11-21 DIAGNOSIS — N1832 Chronic kidney disease, stage 3b: Secondary | ICD-10-CM | POA: Insufficient documentation

## 2019-11-21 DIAGNOSIS — R0602 Shortness of breath: Secondary | ICD-10-CM | POA: Diagnosis not present

## 2019-11-21 DIAGNOSIS — D631 Anemia in chronic kidney disease: Secondary | ICD-10-CM

## 2019-11-21 DIAGNOSIS — Z823 Family history of stroke: Secondary | ICD-10-CM | POA: Insufficient documentation

## 2019-11-21 DIAGNOSIS — Z8051 Family history of malignant neoplasm of kidney: Secondary | ICD-10-CM | POA: Insufficient documentation

## 2019-11-21 DIAGNOSIS — Z833 Family history of diabetes mellitus: Secondary | ICD-10-CM | POA: Insufficient documentation

## 2019-11-21 DIAGNOSIS — Z811 Family history of alcohol abuse and dependence: Secondary | ICD-10-CM | POA: Diagnosis not present

## 2019-11-21 DIAGNOSIS — I129 Hypertensive chronic kidney disease with stage 1 through stage 4 chronic kidney disease, or unspecified chronic kidney disease: Secondary | ICD-10-CM | POA: Diagnosis not present

## 2019-11-21 LAB — HEMOGLOBIN AND HEMATOCRIT, BLOOD
HCT: 30.1 % — ABNORMAL LOW (ref 36.0–46.0)
Hemoglobin: 9.4 g/dL — ABNORMAL LOW (ref 12.0–15.0)

## 2019-11-21 MED ORDER — EPOETIN ALFA-EPBX 10000 UNIT/ML IJ SOLN
10000.0000 [IU] | Freq: Once | INTRAMUSCULAR | Status: AC
  Administered 2019-11-21: 10000 [IU] via SUBCUTANEOUS

## 2019-12-04 ENCOUNTER — Other Ambulatory Visit: Payer: Self-pay

## 2019-12-04 ENCOUNTER — Ambulatory Visit: Payer: Medicare HMO | Attending: Nurse Practitioner | Admitting: Occupational Therapy

## 2019-12-04 DIAGNOSIS — I890 Lymphedema, not elsewhere classified: Secondary | ICD-10-CM | POA: Diagnosis present

## 2019-12-04 DIAGNOSIS — D631 Anemia in chronic kidney disease: Secondary | ICD-10-CM

## 2019-12-04 DIAGNOSIS — N183 Chronic kidney disease, stage 3 unspecified: Secondary | ICD-10-CM

## 2019-12-04 NOTE — Therapy (Signed)
[place] MAIN [facility] SERVICES [address] [place], [place], [address] Phone: [phone]   Fax:  [phone]  Occupational Therapy Treatment Note and Discharge Summary:  Lymphedema Care  Patient Details  Name: Sara Bright MRN: [id number] Date of Birth: 07-10-1940 Referring Provider (OT): Eulogio Ditch, [place]   Encounter Date: 12/04/2019   OT End of Session - 12/04/19 1654    Visit Number 33    Number of Visits 36    Date for OT Re-Evaluation 12/12/19    OT Start Time 0100    OT Stop Time 0205    OT Time Calculation (min) 65 min    Equipment Utilized During Treatment friction gloves, Tyvek sock    Activity Tolerance Patient tolerated treatment well;No increased pain    Behavior During Therapy WFL for tasks assessed/performed           Past Medical History:  Diagnosis Date  . Adenoma of colon   . Adenomatous colon polyp   . Anemia    IDA  . Cataract   . Cervical stenosis of spinal canal   . Chronic kidney disease    stage 3  . Diabetes mellitus without complication ([place])   . GERD (gastroesophageal reflux disease)   . Glaucoma   . Heme positive stool   . History of UTI   . Hx of gallstones   . Hyperkalemia   . Hyperlipidemia   . Hyperlipidemia   . Hypertension   . Lumbar stenosis   . Lymphedema   . Neuromuscular disorder ([place])   . Neuropathy associated with endocrine disorder ([place])   . Obesity   . OSA on CPAP    on CPAP  . Osteoarthritis   . Osteoarthritis   . Port-A-Cath in place   . Retinopathy due to secondary diabetes ([place])   . Secondary hyperparathyroidism of renal origin [facility])     Past Surgical History:  Procedure Laterality Date  . ACDF X2    . BACK SURGERY    . Cataract extraction right    . catarect extraction left    . CHOLECYSTECTOMY    . COLONOSCOPY WITH PROPOFOL N/A 12/02/2016   Procedure: COLONOSCOPY WITH PROPOFOL;  Surgeon: Manya Silvas, MD;  Location: [facility] ENDOSCOPY;  Service: Endoscopy;   Laterality: N/A;  . COLONOSCOPY WITH PROPOFOL N/A 11/10/2017   Procedure: COLONOSCOPY WITH PROPOFOL;  Surgeon: Toledo, Benay Pike, MD;  Location: [facility] ENDOSCOPY;  Service: Gastroenterology;  Laterality: N/A;  . colonoscopy with removal lesions by snare    . Endoscoic carpal tunnel release    . ESOPHAGOGASTRODUODENOSCOPY N/A 11/10/2017   Procedure: ESOPHAGOGASTRODUODENOSCOPY (EGD);  Surgeon: Toledo, Benay Pike, MD;  Location: [facility] ENDOSCOPY;  Service: Gastroenterology;  Laterality: N/A;  . ESOPHAGOGASTRODUODENOSCOPY (EGD) WITH PROPOFOL N/A 12/02/2016   Procedure: ESOPHAGOGASTRODUODENOSCOPY (EGD) WITH PROPOFOL;  Surgeon: Manya Silvas, MD;  Location[facility] ENDOSCOPY;  Service: Endoscopy;  Laterality: N/A;  . IR IMAGING GUIDED PORT INSERTION  10/01/2017  . Laminectomy posterior lumbar facetectomy and formaninotomy w/Decomp    . Laminectony posterior cervicle decomp w/Facectomy and foraminotomy    . VEIN LIGATION AND STRIPPING Left     There were no vitals filed for this visit.   Subjective Assessment - 12/04/19 1649    Subjective  Mrs Lemme presents for OT visit  33/36, her last scheduled vivit,  to address BLE lymphedema. This visit is Pt's 30 day follow along . Pt presents with BLE custom compression stockings in place. She  denies leg  pain this morning.Pt reports she feels she is doing well. Her leg pain is much reduced. She "wears stockings daily and takes good care of her skin."    Pertinent History Chronic leg swelling and associated pain > 20 years; , Hx recurrent  ulcers, hx of lymphorrhea, Contributing factors include CKD, stage 3, HTN, Obesity, OSA (has cpap), OA. Other pertinent medical hx include B neuropathy hands and feet, hx vein ligation and stripping s/p 5-6 yrs, cervicle and lumbar laminectomies, glaucoma and diabetic retinopathy    Limitations BLE weakness, impaired balance, impaired sensation hands and feet, impaired vision, increased falls risk, decreased hip AROM, claudication     Repetition Increases Symptoms    Special Tests +Stemmer base of toes bilaterally    Patient Stated Goals Want to stop these leg ulcers from returning, Get leg swelling better under control and keep symptoms from getting worse.    Pain Onset Other (comment)   >20 YEARS                       OT Treatments/Exercises (OP) - 12/04/19 0001      ADLs   ADL Education Given Yes      Manual Therapy   Manual Therapy Edema management;Manual Lymphatic Drainage (MLD);Other (comment)   Max A to don RLE compression garment to assess fit after to    Manual Lymphatic Drainage (MLD) MLD to RLE as established. Fibrosis techniques to dense fibrosis at L leg b and ankle.     Other Manual Therapy skin care with low ph castor oil during MLD to increase hydration and ensure correct correct skin ph.                  OT Education - 12/04/19 1653    Education Details Continued skilled Pt/caregiver education  And LE ADL training throughout visit for lymphedema self care/ home program, including compression wrapping, compression garment and device wear/care, lymphatic pumping ther ex, simple self-MLD, and skin care. Discussed progress towards goals.    Person(s) Educated Patient    Methods Explanation;Demonstration;Handout;Tactile cues;Verbal cues    Comprehension Verbalized understanding;Returned demonstration;Need further instruction;Verbal cues required;Tactile cues required;Other (comment)   loaned equipment for practive over visit interval              OT Long Term Goals - 11/06/19 1240      OT LONG TERM GOAL #1   Title Pt will be able to apply BLE, knee length, multi-layer, short stretch compression wraps daily to one leg at a time using correct gradient techniques with MAXIMUM CAREGIVER ASSISTANCE   to achieve optimal limb volume reduction, to return affected limb/s, as closely as possible, to premorbid size and shape, to limit infection risk, and to improve safe functional  ambulation and mobility.    Baseline Dependent    Time 4    Period Days    Status Achieved      OT LONG TERM GOAL #2   Title Pt will be able to verbalize signs and symptoms of cellulitis infection and identify at least 4 common lymphedema precautions using printed resource for reference to limit LE progression over time to limit risk of infection and LE exacerbation.    Baseline Max A    Time 4    Period Days    Status Achieved      OT LONG TERM GOAL #3   Title Pt will tolerate knee length,  multilayer short stretch compression wrap to single leg up  to 23/ 7 to achieve optimal swelling reduction to arrest lymphedema progression and in preparation for fitting appropriate compression garments/ devices.    Baseline Max A    Time 6    Period Days    Status Achieved      OT LONG TERM GOAL #4   Title With max CG support daily for compression wrapping  Pt will achieve and sustain at least  85%  compliance with daily LE self-care home program (skin care, lymphatic pumping therex, compression and simple self-MLD) to reduce and control limb swelling, reduce infection risk and limit LE progression.    Baseline Dependent    Time 12    Period Weeks    Status Achieved      OT LONG TERM GOAL #5   Title Pt will achieve at least 10% limb volume reduction in RLE and 5% reduction in LLE belowthe knees to reduce recurrent leg ulcers, reduce infection risk and limit LE progression.   Good progress. 5.53% reduction measured 07/17/19   Baseline dependent    Time 12    Period Weeks    Status Partially Met                 Plan - 12/04/19 1655    Clinical Impression Statement Mrs. Castellanos has met all OT goals for lymphedema care. Leg swelling is decreased and well controlled with custom compression garments, which she is able to don and doff with assistive friction gloves. She is 100% compliant with all LE self-care home program components. Pt has appropriate compression garments that control  swelling and are comfortable. Mrs. Chavira is discharged from OT for lymphedema care. It has been a pleasure to work with her and her family on her goals for lymphedema management. When she requires replacement compression garments, or if she needs additional care, I am happy to assist. A new referral is needed to resume OT.    OT Occupational Profile and History Comprehensive Assessment- Review of records and extensive additional review of physical, cognitive, psychosocial history related to current functional performance    Occupational performance deficits (Please refer to evaluation for details): ADL's;Leisure;Social Participation;IADL's;Work    Body Structure / Function / Physical Skills ADL;Decreased knowledge of precautions;Flexibility;ROM;Mobility;Decreased knowledge of use of DME;Balance;Skin integrity;Wound;Edema;Pain;IADL    Rehab Potential Good    Clinical Decision Making Several treatment options, min-mod task modification necessary    Comorbidities Affecting Occupational Performance: Presence of comorbidities impacting occupational performance    Comorbidities impacting occupational performance description: see SUBJECTIVE for contibuting comorbidities    Modification or Assistance to Complete Evaluation  Max significant modification of tasks or assist is necessary to complete    OT Frequency 2x / week   2-3 x weekly x 12 weeks and prn   OT Duration 12 weeks   and PRN   OT Treatment/Interventions Self-care/ADL training;Therapeutic exercise;Manual lymph drainage;Compression bandaging;Patient/family education;Other (comment);Therapeutic activities;Manual Therapy;DME and/or AE instruction   SKIN CARE   Plan CDT: MLD, gradient wraps, ther ex, skin care,    OT Home Exercise Plan Caregiver to apply compression wraps daily during visit intervals and assist with donning / doffing compression consistently    Recommended Other Services Consider Velcro-style, adjustable , knee length Jobst, CircAid  cmpression wraps as an alternative to traditional elastic stockings for ease of donning    Consulted and Agree with Plan of Care Patient           Patient will benefit from skilled therapeutic intervention in order to improve the  following deficits and impairments:   Body Structure / Function / Physical Skills: ADL, Decreased knowledge of precautions, Flexibility, ROM, Mobility, Decreased knowledge of use of DME, Balance, Skin integrity, Wound, Edema, Pain, IADL       Visit Diagnosis: Lymphedema, not elsewhere classified    Problem List Patient Active Problem List   Diagnosis Date Noted  . GERD (gastroesophageal reflux disease) 01/20/2019  . Hyperlipidemia 01/20/2019  . Claudication ([place]) 01/07/2019  . Venous ulcer of left leg ([place]) 08/13/2018  . Diabetic polyneuropathy associated with type 2 diabetes mellitus ([place]) 05/30/2018  . Macrocytosis 04/26/2018  . Fecal occult blood test positive 10/08/2017  . IDA (iron deficiency anemia) 10/08/2017  . Chronic venous insufficiency 12/25/2016  . Lymphedema 12/25/2016  . Diabetes ([place]) 12/25/2016  . Essential hypertension 12/25/2016  . Hx of adenomatous colonic polyps 10/22/2016  . DOE (dyspnea on exertion) 07/22/2016  . CKD (chronic kidney disease) stage 3, GFR 30-59 ml/min 06/23/2016  . Swelling of limb 12/31/2015  . Severe obesity (BMI >= 40) ([place]) 12/31/2015  . Varicose veins of both legs with edema 12/31/2015  . VV (varicose veins) 11/27/2015  . Other iron deficiency anemia 10/08/2015  . Anemia due to stage 3 chronic kidney disease 10/08/2015  . BMI 45.0-49.9, adult ([place]) 10/05/2015  . OSA on CPAP 03/10/2015  . Lumbar stenosis with neurogenic claudication 09/30/2011    Andrey Spearman, MS, OTR/L, [facility] 12/04/19 5:00 PM  [place] [address] [place], [place], [address] Phone: [phone]   Fax:  [phone]  Name: Sara Bright MRN: [id number] Date of  Birth: December 31, 1940

## 2019-12-05 ENCOUNTER — Other Ambulatory Visit: Payer: Self-pay

## 2019-12-05 ENCOUNTER — Inpatient Hospital Stay: Payer: Medicare HMO

## 2019-12-05 VITALS — BP 151/84 | HR 71 | Temp 96.9°F | Resp 16 | Wt 259.5 lb

## 2019-12-05 DIAGNOSIS — I129 Hypertensive chronic kidney disease with stage 1 through stage 4 chronic kidney disease, or unspecified chronic kidney disease: Secondary | ICD-10-CM | POA: Diagnosis not present

## 2019-12-05 DIAGNOSIS — D631 Anemia in chronic kidney disease: Secondary | ICD-10-CM

## 2019-12-05 DIAGNOSIS — N183 Chronic kidney disease, stage 3 unspecified: Secondary | ICD-10-CM

## 2019-12-05 LAB — HEMOGLOBIN AND HEMATOCRIT, BLOOD
HCT: 28.7 % — ABNORMAL LOW (ref 36.0–46.0)
Hemoglobin: 8.9 g/dL — ABNORMAL LOW (ref 12.0–15.0)

## 2019-12-05 MED ORDER — EPOETIN ALFA-EPBX 10000 UNIT/ML IJ SOLN
10000.0000 [IU] | Freq: Once | INTRAMUSCULAR | Status: AC
  Administered 2019-12-05: 10000 [IU] via SUBCUTANEOUS

## 2019-12-13 ENCOUNTER — Other Ambulatory Visit: Payer: Self-pay

## 2019-12-13 DIAGNOSIS — N183 Chronic kidney disease, stage 3 unspecified: Secondary | ICD-10-CM

## 2019-12-13 DIAGNOSIS — D631 Anemia in chronic kidney disease: Secondary | ICD-10-CM

## 2019-12-19 ENCOUNTER — Other Ambulatory Visit: Payer: Self-pay

## 2019-12-19 ENCOUNTER — Inpatient Hospital Stay: Payer: Medicare HMO | Attending: Hematology and Oncology

## 2019-12-19 ENCOUNTER — Inpatient Hospital Stay: Payer: Medicare HMO

## 2019-12-19 VITALS — BP 127/60 | HR 72 | Resp 18

## 2019-12-19 DIAGNOSIS — D7589 Other specified diseases of blood and blood-forming organs: Secondary | ICD-10-CM | POA: Diagnosis not present

## 2019-12-19 DIAGNOSIS — Z8249 Family history of ischemic heart disease and other diseases of the circulatory system: Secondary | ICD-10-CM | POA: Insufficient documentation

## 2019-12-19 DIAGNOSIS — N183 Chronic kidney disease, stage 3 unspecified: Secondary | ICD-10-CM

## 2019-12-19 DIAGNOSIS — Z79899 Other long term (current) drug therapy: Secondary | ICD-10-CM | POA: Diagnosis not present

## 2019-12-19 DIAGNOSIS — Z811 Family history of alcohol abuse and dependence: Secondary | ICD-10-CM | POA: Diagnosis not present

## 2019-12-19 DIAGNOSIS — D631 Anemia in chronic kidney disease: Secondary | ICD-10-CM | POA: Diagnosis present

## 2019-12-19 DIAGNOSIS — Z841 Family history of disorders of kidney and ureter: Secondary | ICD-10-CM | POA: Insufficient documentation

## 2019-12-19 DIAGNOSIS — I129 Hypertensive chronic kidney disease with stage 1 through stage 4 chronic kidney disease, or unspecified chronic kidney disease: Secondary | ICD-10-CM | POA: Insufficient documentation

## 2019-12-19 DIAGNOSIS — Z833 Family history of diabetes mellitus: Secondary | ICD-10-CM | POA: Insufficient documentation

## 2019-12-19 DIAGNOSIS — Z8051 Family history of malignant neoplasm of kidney: Secondary | ICD-10-CM | POA: Insufficient documentation

## 2019-12-19 DIAGNOSIS — Z836 Family history of other diseases of the respiratory system: Secondary | ICD-10-CM | POA: Insufficient documentation

## 2019-12-19 DIAGNOSIS — Z823 Family history of stroke: Secondary | ICD-10-CM | POA: Insufficient documentation

## 2019-12-19 DIAGNOSIS — N1832 Chronic kidney disease, stage 3b: Secondary | ICD-10-CM | POA: Insufficient documentation

## 2019-12-19 LAB — HEMOGLOBIN AND HEMATOCRIT, BLOOD
HCT: 29.7 % — ABNORMAL LOW (ref 36.0–46.0)
Hemoglobin: 9.2 g/dL — ABNORMAL LOW (ref 12.0–15.0)

## 2019-12-19 MED ORDER — EPOETIN ALFA-EPBX 10000 UNIT/ML IJ SOLN
10000.0000 [IU] | Freq: Once | INTRAMUSCULAR | Status: AC
  Administered 2019-12-19: 10000 [IU] via SUBCUTANEOUS

## 2020-01-01 NOTE — Progress Notes (Signed)
[facility]  [address], [address] [place], [place] [address] Phone: [phone]  Fax: [phone]   Clinic Day:  01/02/2020  Referring physician: Sallee Lange, *  Chief Complaint: Sara Bright is a 79 y.o. female with anemia of chronic kidney disease who is seen for 12 week assessment.   HPI: The patient was last seen in the hematology clinic on 10/10/2019. At that time, she felt "ok".  She noted some shortness of breath on exertion.  She denied any bleeding. Exam was stable. Hematocrit was 29.9, hemoglobin 9.3, MCV 102.0, platelets 236,000, WBC 4,900. Chloride was 1122. BUN was 49. Creatinine was 1.54 (CrCl 37 ml/min). Ferritin was 132. She received Retacrit 10,000 units.  The patient saw Dr. Holley Raring on 10/16/2019. Her chronic kidney disease appeared to be improved with a GFR of 36 ml/min. Follow up was planned for 4 months.  The patient tested positive for COVID-19 on 11/09/2019.  Labs followed: 10/24/2019: Hematocrit 29.5. Hemoglobin 9.2. 11/07/2019: Hematocrit 28.8. Hemoglobin 9.0. 11/21/2019: Hematocrit 30.1. Hemoglobin 9.4. 12/05/2019: Hematocrit 28.7. Hemoglobin 8.9. 12/19/2019: Hematocrit 29.7. Hemoglobin 9.2.  The patient received Retacrit 10,000 units biweekly x 5 (10/24/2019 - 12/19/2019).  During the interim, she has been "just fine." Her breathing is stable. She denies bleeding of any kind. Her appetite is up and down. She has been trying to eat more fruits and vegetables. Her leg swelling has improved with compression socks, though she notes that one pair cost her $500. She tries to prop her legs up as much as she can.    Past Medical History:  Diagnosis Date  . Adenoma of colon   . Adenomatous colon polyp   . Anemia    IDA  . Cataract   . Cervical stenosis of spinal canal   . Chronic kidney disease    stage 3  . Diabetes mellitus without complication ([place])   . GERD (gastroesophageal reflux disease)   . Glaucoma   .  Heme positive stool   . History of UTI   . Hx of gallstones   . Hyperkalemia   . Hyperlipidemia   . Hyperlipidemia   . Hypertension   . Lumbar stenosis   . Lymphedema   . Neuromuscular disorder ([place])   . Neuropathy associated with endocrine disorder ([place])   . Obesity   . OSA on CPAP    on CPAP  . Osteoarthritis   . Osteoarthritis   . Port-A-Cath in place   . Retinopathy due to secondary diabetes ([place])   . Secondary hyperparathyroidism of renal origin [facility])     Past Surgical History:  Procedure Laterality Date  . ACDF X2    . BACK SURGERY    . Cataract extraction right    . catarect extraction left    . CHOLECYSTECTOMY    . COLONOSCOPY WITH PROPOFOL N/A 12/02/2016   Procedure: COLONOSCOPY WITH PROPOFOL;  Surgeon: Manya Silvas, MD;  Location: [facility] ENDOSCOPY;  Service: Endoscopy;  Laterality: N/A;  . COLONOSCOPY WITH PROPOFOL N/A 11/10/2017   Procedure: COLONOSCOPY WITH PROPOFOL;  Surgeon: Toledo, Benay Pike, MD;  Location: [facility] ENDOSCOPY;  Service: Gastroenterology;  Laterality: N/A;  . colonoscopy with removal lesions by snare    . Endoscoic carpal tunnel release    . ESOPHAGOGASTRODUODENOSCOPY N/A 11/10/2017   Procedure: ESOPHAGOGASTRODUODENOSCOPY (EGD);  Surgeon: Toledo, Benay Pike, MD;  Location: [facility] ENDOSCOPY;  Service: Gastroenterology;  Laterality: N/A;  . ESOPHAGOGASTRODUODENOSCOPY (EGD) WITH PROPOFOL N/A 12/02/2016   Procedure: ESOPHAGOGASTRODUODENOSCOPY (EGD) WITH PROPOFOL;  Surgeon: Manya Silvas, MD;  Location: [facility] ENDOSCOPY;  Service: Endoscopy;  Laterality: N/A;  . IR IMAGING GUIDED PORT INSERTION  10/01/2017  . Laminectomy posterior lumbar facetectomy and formaninotomy w/Decomp    . Laminectony posterior cervicle decomp w/Facectomy and foraminotomy    . VEIN LIGATION AND STRIPPING Left     Family History  Problem Relation Age of Onset  . Coronary artery disease Mother   . Diabetes type II Mother   . Hypertension Mother   . Tuberculosis Father   .  Stroke Father   . Diabetes type II Sister   . Migraines Sister   . Alcohol abuse Brother   . Coronary artery disease Brother   . Diabetes type II Brother   . Kidney cancer Brother   . Kidney disease Brother   . Heart attack Brother   . Hypertension Brother     Social History:  reports that she has never smoked. She has never used smokeless tobacco. She reports that she does not drink alcohol and does not use drugs. She lives in [place]. The patient is alone today.  Allergies:  Allergies  Allergen Reactions  . Aspirin     Other reaction(s): Other (see comments)  . Hydrocodone-Acetaminophen Other (See Comments)    Sedation and GI upset, pt is not allergic to acetaminophen  . Statins Other (See Comments)  . Tramadol-Acetaminophen Nausea And Vomiting    GI upset, pt is not allergic to acetaminophen    Current Medications: Current Outpatient Medications  Medication Sig Dispense Refill  . acetaminophen (TYLENOL) 500 MG tablet Take 500 mg by mouth every 6 (six) hours as needed.     Marland Kitchen albuterol (PROVENTIL HFA;VENTOLIN HFA) 108 (90 Base) MCG/ACT inhaler Inhale 2 puffs into the lungs every 6 (six) hours as needed for wheezing or shortness of breath.    Marland Kitchen amLODipine (NORVASC) 10 MG tablet Take 10 mg by mouth daily.    Marland Kitchen aspirin EC 81 MG tablet Take 81 mg by mouth daily.    . ASSURE COMFORT LANCETS 30G MISC     . calcitRIOL (ROCALTROL) 0.25 MCG capsule Take 0.5 mcg by mouth 3 (three) times a week.     . carvedilol (COREG) 6.25 MG tablet Take 6.25 mg by mouth 2 (two) times daily.  12  . doxycycline (VIBRAMYCIN) 100 MG capsule Take 1 capsule (100 mg total) by mouth 2 (two) times daily. (Patient not taking: Reported on 07/18/2019) 20 capsule 0  . ferrous sulfate 325 (65 FE) MG tablet Take 325 mg by mouth daily.    . furosemide (LASIX) 40 MG tablet Take 40 mg by mouth daily.     Marland Kitchen gabapentin (NEURONTIN) 100 MG capsule Take 100 mg by mouth 2 (two) times daily.     Marland Kitchen gemfibrozil (LOPID) 600 MG  tablet Take 600 mg by mouth daily.     Marland Kitchen gemfibrozil (LOPID) 600 MG tablet TAKE 1 TABLET BY MOUTH EVERY DAY    . glimepiride (AMARYL) 1 MG tablet Take 0.5 mg by mouth daily with breakfast.     . hydrALAZINE (APRESOLINE) 50 MG tablet Take 25 mg by mouth 3 (three) times daily.   12  . Lancet Devices (ADJUSTABLE LANCING DEVICE) MISC     . latanoprost (XALATAN) 0.005 % ophthalmic solution Place 1 drop into both eyes.     Marland Kitchen lidocaine-prilocaine (EMLA) cream Apply 1 application topically as needed. (Patient not taking: Reported on 10/10/2019) 30 g 3  . losartan (COZAAR) 50 MG tablet Take  100 mg by mouth daily.     . Multiple Vitamins-Minerals (CENTRUM SILVER) tablet Take 1 tablet by mouth daily.     . Omega-3 Fatty Acids (FISH OIL) 1000 MG CAPS Take 1,000 mg by mouth daily.     Marland Kitchen omeprazole (PRILOSEC) 20 MG capsule Take 20 mg by mouth daily.     Marland Kitchen omeprazole (PRILOSEC) 20 MG capsule TAKE 1 CAPSULE BY MOUTH EVERY DAY    . ONE TOUCH ULTRA TEST test strip     . pioglitazone (ACTOS) 30 MG tablet Take 30 mg by mouth daily.      No current facility-administered medications for this visit.    Review of Systems  Constitutional: Positive for weight loss (7 lbs). Negative for chills, diaphoresis, fever and malaise/fatigue.       Feels "just fine."  HENT: Negative.  Negative for congestion, ear discharge, ear pain, hearing loss, nosebleeds, sinus pain, sore throat and tinnitus.   Eyes: Negative.  Negative for blurred vision and double vision.  Respiratory: Positive for shortness of breath (on exertion). Negative for cough, hemoptysis, sputum production and wheezing (uses inhaler).   Cardiovascular: Positive for leg swelling (improved). Negative for chest pain and palpitations.  Gastrointestinal: Negative.  Negative for abdominal pain, blood in stool, constipation, diarrhea, heartburn, melena, nausea and vomiting.       Appetite is up and down. Eating more fruits and vegetables.  Genitourinary: Negative.   Negative for dysuria, frequency, hematuria and urgency.  Musculoskeletal: Negative.  Negative for back pain, myalgias and neck pain.  Skin: Negative.  Negative for itching and rash.  Neurological: Negative.  Negative for dizziness, tingling, sensory change, speech change, focal weakness, weakness and headaches.  Endo/Heme/Allergies: Does not bruise/bleed easily.       Diabetes, under good control.  No thyroid issues.  Psychiatric/Behavioral: Negative.  Negative for depression and memory loss. The patient is not nervous/anxious and does not have insomnia.   All other systems reviewed and are negative.  Performance status (ECOG):  1  Vitals There were no vitals taken for this visit.   Physical Exam Vitals and nursing note reviewed.  Constitutional:      General: She is not in acute distress.    Appearance: She is well-developed. She is not diaphoretic.     Comments: She has a rolling walker by her side.  She needs assistance onto exam room table.  HENT:     Head: Normocephalic and atraumatic.     Mouth/Throat:     Mouth: Mucous membranes are moist.     Pharynx: Oropharynx is clear. No oropharyngeal exudate.  Eyes:     General: No scleral icterus.    Extraocular Movements: Extraocular movements intact.     Conjunctiva/sclera: Conjunctivae normal.     Pupils: Pupils are equal, round, and reactive to light.     Comments: Glasses.  Brown eyes.  Neck:     Vascular: No JVD.  Cardiovascular:     Rate and Rhythm: Normal rate and regular rhythm.     Heart sounds: Normal heart sounds. No murmur heard.  No gallop.   Pulmonary:     Effort: Pulmonary effort is normal. No respiratory distress.     Breath sounds: Normal breath sounds. No wheezing or rales.  Chest:     Chest wall: No tenderness.  Abdominal:     General: Bowel sounds are normal. There is no distension.     Palpations: Abdomen is soft. There is no mass.     Tenderness:  There is no abdominal tenderness. There is no guarding  or rebound.  Musculoskeletal:        General: No tenderness. Normal range of motion.     Cervical back: Normal range of motion and neck supple.     Right lower leg: Edema (R>L) present.     Left lower leg: Edema present.     Comments: Wearing compression socks  Lymphadenopathy:     Head:     Right side of head: No preauricular, posterior auricular or occipital adenopathy.     Left side of head: No preauricular, posterior auricular or occipital adenopathy.     Cervical: No cervical adenopathy.     Upper Body:     Right upper body: No supraclavicular or axillary adenopathy.     Left upper body: No supraclavicular or axillary adenopathy.     Lower Body: No right inguinal adenopathy. No left inguinal adenopathy.  Skin:    General: Skin is warm and dry.     Coloration: Skin is not pale.     Findings: No erythema or rash.  Neurological:     Mental Status: She is alert and oriented to person, place, and time.     Deep Tendon Reflexes: Reflexes are normal and symmetric.  Psychiatric:        Behavior: Behavior normal.        Thought Content: Thought content normal.        Judgment: Judgment normal.    No visits with results within 3 Day(s) from this visit.  Latest known visit with results is:  Appointment on 12/19/2019  Component Date Value Ref Range Status  . Hemoglobin 12/19/2019 9.2* 12.0 - 15.0 g/dL Final  . HCT 12/19/2019 29.7* 36 - 46 % Final   Performed at [facility], [address], [place], [place] [address]    Assessment:  Sara Bright is a 79 y.o. female withanemia of chronic kidney disease.She has stage III chronic kidney disease. SPEP and free light chain ratio was normal on 11/12/2015.  She receivedAranesp 200 mcg on 09/02/2016, 12/22/2016, 03/23/2017, 07/20/2017, 08/17/2017, and 09/15/2017. She received Aranesp 300 mcgon 10/06/2017, 10/19/2017, 01/04/2018, 03/08/2018,and03/05/2018.She began Retacrit10,000 units on 07/04/2018 (last  01/17/2019).  She received 1 unit of PRBCson 09/22/2017. She received Venoferx 4 (10/22/2015 - 11/05/2015), x4 (01/21/2016 - 02/11/2016), 11/24/2016, 06/22/2017, 09/14/2017, 10/01/2017, 10/12/2017, 10/26/2017, 11/16/2017, and 03/01/2018.  Ferritinhas been followed: 63 on 10/08/2015, 290 on 01/14/2016, 651 on 05/12/2016, 467 on 09/01/2016, 413 on 11/19/2016, 633 on 12/22/2016, 374 on 01/19/2017, 480 on 06/22/2017, 135 on 09/22/2017, 159 on 10/06/2017, 373 on 03/01/2018, 289 on 03/29/2018, 257 on 07/04/2018, 217 on 08/30/2018, 178 on 11/08/2018, 191 on 01/31/2019, 145 on 04/25/2019, 153 on 07/18/2019, and 132 on 10/10/2019.   EGDon 09/04/2019revealed an irregularZ-line irregular, at the gastroesophageal junction, normal stomach, and normal duodenum.Colonoscopyon 11/10/2017 revealed diverticulosis in the sigmoid colonand non-bleeding internal hemorrhoids.  She has macrocytic RBC indices. B12, folate, and TSH were normal on 05/09/2019.  She received the Moderna COVID-19 vaccine on 04/19/2019 and 05/17/2019.   Symptomatically, she has been "just fine."  She denies bleeding of any kind. Her appetite is up and down. She has been trying to eat more fruits and vegetables. Her leg swelling has improved with compression socks.  Plan: 1.   Labs today: CBC with diff, BMP, ferritin. 2.Anemia of chronic renal disease Hematocrit30.1. Hemoglobin9.4.  MCV 102.0 Ferritin122.   Maintain ferritin >100. Retacrit today. 3.Macrocytosis MCV 102.0. She has had macrocytic RBC indices since07/26/2019.  She has no known liver disease. B12 was727,folate25,andTSH 5.026 on 05/09/2019. 10 you to monitor. 4.Renal insufficiency Creatinine1.89. She is followed by Dr. Holley Raring (note sent). 5.   Retacrit today. 6.   RTC every 2 weeks x 6 for labs (HCT/Hgb) and  +/- Retacrit.  7.   RTC in 12 weeks for MD assessment, labs (CBC with diff, BMP, ferritin) and +/- Retacrit.  I discussed the assessment and treatment plan with the patient.  The patient was provided an opportunity to ask questions and all were answered.  The patient agreed with the plan and demonstrated an understanding of the instructions.  The patient was advised to call back if the symptoms worsen or if the condition fails to improve as anticipated.   Lequita Asal, MD, PhD    01/02/2020, 1:42 PM  I, Mirian Mo Tufford, am acting as Education administrator for [employer]. Mike Gip, MD, PhD.  I, Willine Schwalbe C. Mike Gip, MD, have reviewed the above documentation for accuracy and completeness, and I agree with the above.

## 2020-01-02 ENCOUNTER — Inpatient Hospital Stay: Payer: Medicare HMO

## 2020-01-02 ENCOUNTER — Telehealth: Payer: Self-pay

## 2020-01-02 ENCOUNTER — Encounter: Payer: Self-pay | Admitting: Hematology and Oncology

## 2020-01-02 ENCOUNTER — Other Ambulatory Visit: Payer: Self-pay

## 2020-01-02 ENCOUNTER — Inpatient Hospital Stay (HOSPITAL_BASED_OUTPATIENT_CLINIC_OR_DEPARTMENT_OTHER): Payer: Medicare HMO | Admitting: Hematology and Oncology

## 2020-01-02 VITALS — BP 137/52 | HR 62 | Temp 96.6°F | Resp 18 | Wt 257.0 lb

## 2020-01-02 DIAGNOSIS — Z95828 Presence of other vascular implants and grafts: Secondary | ICD-10-CM

## 2020-01-02 DIAGNOSIS — D631 Anemia in chronic kidney disease: Secondary | ICD-10-CM | POA: Diagnosis not present

## 2020-01-02 DIAGNOSIS — D7589 Other specified diseases of blood and blood-forming organs: Secondary | ICD-10-CM | POA: Diagnosis not present

## 2020-01-02 DIAGNOSIS — N1832 Chronic kidney disease, stage 3b: Secondary | ICD-10-CM | POA: Diagnosis not present

## 2020-01-02 DIAGNOSIS — N183 Chronic kidney disease, stage 3 unspecified: Secondary | ICD-10-CM

## 2020-01-02 DIAGNOSIS — I129 Hypertensive chronic kidney disease with stage 1 through stage 4 chronic kidney disease, or unspecified chronic kidney disease: Secondary | ICD-10-CM | POA: Diagnosis not present

## 2020-01-02 LAB — CBC WITH DIFFERENTIAL/PLATELET
Abs Immature Granulocytes: 0.02 10*3/uL (ref 0.00–0.07)
Basophils Absolute: 0.1 10*3/uL (ref 0.0–0.1)
Basophils Relative: 1 %
Eosinophils Absolute: 0.2 10*3/uL (ref 0.0–0.5)
Eosinophils Relative: 4 %
HCT: 30.1 % — ABNORMAL LOW (ref 36.0–46.0)
Hemoglobin: 9.4 g/dL — ABNORMAL LOW (ref 12.0–15.0)
Immature Granulocytes: 1 %
Lymphocytes Relative: 25 %
Lymphs Abs: 1 10*3/uL (ref 0.7–4.0)
MCH: 31.9 pg (ref 26.0–34.0)
MCHC: 31.2 g/dL (ref 30.0–36.0)
MCV: 102 fL — ABNORMAL HIGH (ref 80.0–100.0)
Monocytes Absolute: 0.6 10*3/uL (ref 0.1–1.0)
Monocytes Relative: 14 %
Neutro Abs: 2.2 10*3/uL (ref 1.7–7.7)
Neutrophils Relative %: 55 %
Platelets: 162 10*3/uL (ref 150–400)
RBC: 2.95 MIL/uL — ABNORMAL LOW (ref 3.87–5.11)
RDW: 14.6 % (ref 11.5–15.5)
WBC: 3.9 10*3/uL — ABNORMAL LOW (ref 4.0–10.5)
nRBC: 0 % (ref 0.0–0.2)

## 2020-01-02 LAB — BASIC METABOLIC PANEL
Anion gap: 8 (ref 5–15)
BUN: 58 mg/dL — ABNORMAL HIGH (ref 8–23)
CO2: 23 mmol/L (ref 22–32)
Calcium: 9.7 mg/dL (ref 8.9–10.3)
Chloride: 108 mmol/L (ref 98–111)
Creatinine, Ser: 1.89 mg/dL — ABNORMAL HIGH (ref 0.44–1.00)
GFR, Estimated: 26 mL/min — ABNORMAL LOW (ref >60)
Glucose, Bld: 79 mg/dL (ref 70–99)
Potassium: 4.8 mmol/L (ref 3.5–5.1)
Sodium: 139 mmol/L (ref 135–145)

## 2020-01-02 LAB — FERRITIN: Ferritin: 122 ng/mL (ref 11–307)

## 2020-01-02 MED ORDER — SODIUM CHLORIDE 0.9% FLUSH
10.0000 mL | Freq: Once | INTRAVENOUS | Status: AC
  Administered 2020-01-02: 10 mL via INTRAVENOUS
  Filled 2020-01-02: qty 10

## 2020-01-02 MED ORDER — EPOETIN ALFA-EPBX 10000 UNIT/ML IJ SOLN
10000.0000 [IU] | Freq: Once | INTRAMUSCULAR | Status: AC
  Administered 2020-01-02: 10000 [IU] via SUBCUTANEOUS
  Filled 2020-01-02: qty 1

## 2020-01-02 MED ORDER — HEPARIN SOD (PORK) LOCK FLUSH 100 UNIT/ML IV SOLN
500.0000 [IU] | Freq: Once | INTRAVENOUS | Status: AC
  Administered 2020-01-02: 500 [IU] via INTRAVENOUS
  Filled 2020-01-02: qty 5

## 2020-01-02 NOTE — Telephone Encounter (Signed)
Labs have been routed to Dr Holley Raring office today.

## 2020-01-16 ENCOUNTER — Inpatient Hospital Stay: Payer: Medicare HMO

## 2020-01-16 ENCOUNTER — Other Ambulatory Visit: Payer: Self-pay

## 2020-01-16 ENCOUNTER — Inpatient Hospital Stay: Payer: Medicare HMO | Attending: Hematology and Oncology

## 2020-01-16 VITALS — BP 137/62 | HR 60 | Temp 96.4°F | Resp 18

## 2020-01-16 DIAGNOSIS — M7989 Other specified soft tissue disorders: Secondary | ICD-10-CM | POA: Diagnosis not present

## 2020-01-16 DIAGNOSIS — Z836 Family history of other diseases of the respiratory system: Secondary | ICD-10-CM | POA: Insufficient documentation

## 2020-01-16 DIAGNOSIS — R0602 Shortness of breath: Secondary | ICD-10-CM | POA: Diagnosis not present

## 2020-01-16 DIAGNOSIS — Z823 Family history of stroke: Secondary | ICD-10-CM | POA: Insufficient documentation

## 2020-01-16 DIAGNOSIS — R634 Abnormal weight loss: Secondary | ICD-10-CM | POA: Insufficient documentation

## 2020-01-16 DIAGNOSIS — N183 Chronic kidney disease, stage 3 unspecified: Secondary | ICD-10-CM

## 2020-01-16 DIAGNOSIS — Z79899 Other long term (current) drug therapy: Secondary | ICD-10-CM | POA: Diagnosis not present

## 2020-01-16 DIAGNOSIS — D631 Anemia in chronic kidney disease: Secondary | ICD-10-CM

## 2020-01-16 DIAGNOSIS — Z811 Family history of alcohol abuse and dependence: Secondary | ICD-10-CM | POA: Insufficient documentation

## 2020-01-16 DIAGNOSIS — D7589 Other specified diseases of blood and blood-forming organs: Secondary | ICD-10-CM | POA: Insufficient documentation

## 2020-01-16 DIAGNOSIS — Z8051 Family history of malignant neoplasm of kidney: Secondary | ICD-10-CM | POA: Diagnosis not present

## 2020-01-16 DIAGNOSIS — Z841 Family history of disorders of kidney and ureter: Secondary | ICD-10-CM | POA: Insufficient documentation

## 2020-01-16 DIAGNOSIS — Z8249 Family history of ischemic heart disease and other diseases of the circulatory system: Secondary | ICD-10-CM | POA: Diagnosis not present

## 2020-01-16 DIAGNOSIS — Z833 Family history of diabetes mellitus: Secondary | ICD-10-CM | POA: Insufficient documentation

## 2020-01-16 DIAGNOSIS — N1832 Chronic kidney disease, stage 3b: Secondary | ICD-10-CM | POA: Insufficient documentation

## 2020-01-16 LAB — HEMATOCRIT: HCT: 28.6 % — ABNORMAL LOW (ref 36.0–46.0)

## 2020-01-16 LAB — HEMOGLOBIN: Hemoglobin: 8.8 g/dL — ABNORMAL LOW (ref 12.0–15.0)

## 2020-01-16 MED ORDER — EPOETIN ALFA-EPBX 10000 UNIT/ML IJ SOLN
10000.0000 [IU] | Freq: Once | INTRAMUSCULAR | Status: AC
  Administered 2020-01-16: 10000 [IU] via SUBCUTANEOUS

## 2020-01-30 ENCOUNTER — Inpatient Hospital Stay: Payer: Medicare HMO

## 2020-01-30 ENCOUNTER — Other Ambulatory Visit: Payer: Self-pay

## 2020-01-30 VITALS — BP 121/71 | HR 60 | Temp 96.5°F | Resp 18

## 2020-01-30 DIAGNOSIS — D631 Anemia in chronic kidney disease: Secondary | ICD-10-CM

## 2020-01-30 DIAGNOSIS — N1832 Chronic kidney disease, stage 3b: Secondary | ICD-10-CM | POA: Diagnosis not present

## 2020-01-30 DIAGNOSIS — D7589 Other specified diseases of blood and blood-forming organs: Secondary | ICD-10-CM

## 2020-01-30 DIAGNOSIS — N183 Anemia in chronic kidney disease: Secondary | ICD-10-CM

## 2020-01-30 LAB — HEMATOCRIT: HCT: 29.4 % — ABNORMAL LOW (ref 36.0–46.0)

## 2020-01-30 LAB — HEMOGLOBIN: Hemoglobin: 9.1 g/dL — ABNORMAL LOW (ref 12.0–15.0)

## 2020-01-30 MED ORDER — EPOETIN ALFA-EPBX 10000 UNIT/ML IJ SOLN
10000.0000 [IU] | Freq: Once | INTRAMUSCULAR | Status: AC
  Administered 2020-01-30: 10000 [IU] via SUBCUTANEOUS

## 2020-01-30 MED ORDER — EPOETIN ALFA-EPBX 10000 UNIT/ML IJ SOLN
INTRAMUSCULAR | Status: AC
  Filled 2020-01-30: qty 1

## 2020-02-13 ENCOUNTER — Other Ambulatory Visit: Payer: Self-pay

## 2020-02-13 ENCOUNTER — Inpatient Hospital Stay: Payer: Medicare HMO | Attending: Hematology and Oncology

## 2020-02-13 ENCOUNTER — Inpatient Hospital Stay: Payer: Medicare HMO

## 2020-02-13 VITALS — BP 131/67 | HR 73 | Temp 97.9°F

## 2020-02-13 DIAGNOSIS — Z823 Family history of stroke: Secondary | ICD-10-CM | POA: Insufficient documentation

## 2020-02-13 DIAGNOSIS — Z836 Family history of other diseases of the respiratory system: Secondary | ICD-10-CM | POA: Insufficient documentation

## 2020-02-13 DIAGNOSIS — Z811 Family history of alcohol abuse and dependence: Secondary | ICD-10-CM | POA: Diagnosis not present

## 2020-02-13 DIAGNOSIS — D7589 Other specified diseases of blood and blood-forming organs: Secondary | ICD-10-CM | POA: Diagnosis not present

## 2020-02-13 DIAGNOSIS — N183 Chronic kidney disease, stage 3 unspecified: Secondary | ICD-10-CM

## 2020-02-13 DIAGNOSIS — Z79899 Other long term (current) drug therapy: Secondary | ICD-10-CM | POA: Insufficient documentation

## 2020-02-13 DIAGNOSIS — N1832 Chronic kidney disease, stage 3b: Secondary | ICD-10-CM | POA: Diagnosis not present

## 2020-02-13 DIAGNOSIS — D631 Anemia in chronic kidney disease: Secondary | ICD-10-CM

## 2020-02-13 DIAGNOSIS — Z841 Family history of disorders of kidney and ureter: Secondary | ICD-10-CM | POA: Insufficient documentation

## 2020-02-13 DIAGNOSIS — R0602 Shortness of breath: Secondary | ICD-10-CM | POA: Insufficient documentation

## 2020-02-13 DIAGNOSIS — Z8249 Family history of ischemic heart disease and other diseases of the circulatory system: Secondary | ICD-10-CM | POA: Diagnosis not present

## 2020-02-13 DIAGNOSIS — Z8051 Family history of malignant neoplasm of kidney: Secondary | ICD-10-CM | POA: Insufficient documentation

## 2020-02-13 DIAGNOSIS — R609 Edema, unspecified: Secondary | ICD-10-CM | POA: Insufficient documentation

## 2020-02-13 DIAGNOSIS — Z833 Family history of diabetes mellitus: Secondary | ICD-10-CM | POA: Diagnosis not present

## 2020-02-13 LAB — HEMATOCRIT: HCT: 29.1 % — ABNORMAL LOW (ref 36.0–46.0)

## 2020-02-13 LAB — HEMOGLOBIN: Hemoglobin: 9.1 g/dL — ABNORMAL LOW (ref 12.0–15.0)

## 2020-02-13 MED ORDER — EPOETIN ALFA-EPBX 10000 UNIT/ML IJ SOLN
10000.0000 [IU] | Freq: Once | INTRAMUSCULAR | Status: AC
  Administered 2020-02-13: 10000 [IU] via SUBCUTANEOUS
  Filled 2020-02-13: qty 1

## 2020-02-27 ENCOUNTER — Other Ambulatory Visit: Payer: Self-pay

## 2020-02-27 ENCOUNTER — Inpatient Hospital Stay: Payer: Medicare HMO

## 2020-02-27 VITALS — BP 164/66 | HR 68 | Temp 97.7°F

## 2020-02-27 DIAGNOSIS — N183 Chronic kidney disease, stage 3 unspecified: Secondary | ICD-10-CM

## 2020-02-27 DIAGNOSIS — D7589 Other specified diseases of blood and blood-forming organs: Secondary | ICD-10-CM

## 2020-02-27 DIAGNOSIS — N1832 Chronic kidney disease, stage 3b: Secondary | ICD-10-CM | POA: Diagnosis not present

## 2020-02-27 DIAGNOSIS — D631 Anemia in chronic kidney disease: Secondary | ICD-10-CM

## 2020-02-27 LAB — HEMATOCRIT: HCT: 29.9 % — ABNORMAL LOW (ref 36.0–46.0)

## 2020-02-27 LAB — HEMOGLOBIN: Hemoglobin: 9.3 g/dL — ABNORMAL LOW (ref 12.0–15.0)

## 2020-02-27 MED ORDER — EPOETIN ALFA-EPBX 10000 UNIT/ML IJ SOLN
10000.0000 [IU] | Freq: Once | INTRAMUSCULAR | Status: AC
  Administered 2020-02-27: 10000 [IU] via SUBCUTANEOUS
  Filled 2020-02-27: qty 1

## 2020-03-12 ENCOUNTER — Inpatient Hospital Stay: Payer: Medicare HMO

## 2020-03-12 ENCOUNTER — Inpatient Hospital Stay: Payer: Medicare HMO | Attending: Hematology and Oncology

## 2020-03-12 ENCOUNTER — Other Ambulatory Visit: Payer: Self-pay

## 2020-03-12 VITALS — BP 201/72 | HR 76 | Resp 18

## 2020-03-12 VITALS — BP 155/77 | HR 69

## 2020-03-12 DIAGNOSIS — D7589 Other specified diseases of blood and blood-forming organs: Secondary | ICD-10-CM | POA: Diagnosis not present

## 2020-03-12 DIAGNOSIS — D631 Anemia in chronic kidney disease: Secondary | ICD-10-CM

## 2020-03-12 DIAGNOSIS — Z79899 Other long term (current) drug therapy: Secondary | ICD-10-CM | POA: Diagnosis not present

## 2020-03-12 DIAGNOSIS — Z95828 Presence of other vascular implants and grafts: Secondary | ICD-10-CM

## 2020-03-12 DIAGNOSIS — N183 Chronic kidney disease, stage 3 unspecified: Secondary | ICD-10-CM

## 2020-03-12 DIAGNOSIS — N1832 Chronic kidney disease, stage 3b: Secondary | ICD-10-CM | POA: Diagnosis not present

## 2020-03-12 LAB — HEMOGLOBIN: Hemoglobin: 8.8 g/dL — ABNORMAL LOW (ref 12.0–15.0)

## 2020-03-12 LAB — HEMATOCRIT: HCT: 28.3 % — ABNORMAL LOW (ref 36.0–46.0)

## 2020-03-12 MED ORDER — HEPARIN SOD (PORK) LOCK FLUSH 100 UNIT/ML IV SOLN
INTRAVENOUS | Status: AC
  Filled 2020-03-12: qty 5

## 2020-03-12 MED ORDER — SODIUM CHLORIDE 0.9% FLUSH
10.0000 mL | INTRAVENOUS | Status: DC | PRN
  Administered 2020-03-12: 10 mL via INTRAVENOUS
  Filled 2020-03-12: qty 10

## 2020-03-12 MED ORDER — EPOETIN ALFA-EPBX 10000 UNIT/ML IJ SOLN
10000.0000 [IU] | Freq: Once | INTRAMUSCULAR | Status: AC
  Administered 2020-03-12: 10000 [IU] via SUBCUTANEOUS

## 2020-03-12 MED ORDER — EPOETIN ALFA-EPBX 10000 UNIT/ML IJ SOLN
INTRAMUSCULAR | Status: AC
  Filled 2020-03-12: qty 1

## 2020-03-12 MED ORDER — HEPARIN SOD (PORK) LOCK FLUSH 100 UNIT/ML IV SOLN
500.0000 [IU] | Freq: Once | INTRAVENOUS | Status: AC
  Administered 2020-03-12: 500 [IU] via INTRAVENOUS
  Filled 2020-03-12: qty 5

## 2020-03-19 DIAGNOSIS — I890 Lymphedema, not elsewhere classified: Secondary | ICD-10-CM | POA: Diagnosis not present

## 2020-03-22 DIAGNOSIS — G4733 Obstructive sleep apnea (adult) (pediatric): Secondary | ICD-10-CM | POA: Diagnosis not present

## 2020-03-25 ENCOUNTER — Telehealth: Payer: Self-pay | Admitting: Hematology and Oncology

## 2020-03-25 NOTE — Telephone Encounter (Signed)
03/25/2020 Called pt and informed her that appts on 03/26/20 have been moved to 03/28/20 due to inclement weather. Pt confirmed these changes SRW

## 2020-03-26 ENCOUNTER — Inpatient Hospital Stay: Payer: Medicare HMO

## 2020-03-26 ENCOUNTER — Inpatient Hospital Stay: Payer: Medicare HMO | Admitting: Hematology and Oncology

## 2020-03-27 NOTE — Progress Notes (Signed)
[facility]  [address], [address] [place], [place] [address] Phone: [phone]  Fax: [phone]   Clinic Day:  03/28/2020  Referring physician: Anthonette Legato, MD  Chief Complaint: Sara Bright is a 80 y.o. female with anemia of chronic kidney disease who is seen for 12 week assessment.   HPI: The patient was last seen in the hematology clinic on 01/02/2020. At that time, she had been "just fine."  She denied bleeding of any kind. Her appetite was up and down. She had been trying to eat more fruits and vegetables. Her leg swelling had improved with compression socks. Hematocrit was 30.1, hemoglobin 9.4, MCV 102.0, platelets 162,000, WBC 3,900 (ANC 2,200. BUN was 58. Creatinine was 1.89 (CrCl 26 ml/min). Ferritin was 122. She received Retacrit 10,000 units.  The patient saw Dr. Holley Raring on 02/19/2020. Her chronic kidney disease appeared stable. Her glycemic control had improved significantly; most recent hemoglobin A1c was down to 6.1. Follow up was planned for 4 months.  Labs followed: 01/16/2020: Hematocrit 28.6. Hemoglobin 8.8. 01/30/2020: Hematocrit 29.4. Hemoglobin 9.1. 02/13/2020: Hematocrit 29.1. Hemoglobin 9.1. 02/27/2020: Hematocrit 29.9. Hemoglobin 9.3. 03/13/2019: Hematocrit 28.3. Hemoglobin 8.8.  She received Retacrit 10,000 units biweekly x 5 (01/16/2020 - 03/12/2020).  During the interim, she has been "good." Her energy level fluctuates. She is eating well and is trying to eat more fruits and vegetables. Her leg swelling is doing well with compression socks. Her shortness of breath is stable. The patient agrees to increase her Retacrit dose today.   Past Medical History:  Diagnosis Date  . Adenoma of colon   . Adenomatous colon polyp   . Anemia    IDA  . Cataract   . Cervical stenosis of spinal canal   . Chronic kidney disease    stage 3  . Diabetes mellitus without complication ([place])   . GERD (gastroesophageal reflux disease)   .  Glaucoma   . Heme positive stool   . History of UTI   . Hx of gallstones   . Hyperkalemia   . Hyperlipidemia   . Hyperlipidemia   . Hypertension   . Lumbar stenosis   . Lymphedema   . Neuromuscular disorder ([place])   . Neuropathy associated with endocrine disorder ([place])   . Obesity   . OSA on CPAP    on CPAP  . Osteoarthritis   . Osteoarthritis   . Port-A-Cath in place   . Retinopathy due to secondary diabetes ([place])   . Secondary hyperparathyroidism of renal origin [facility])     Past Surgical History:  Procedure Laterality Date  . ACDF X2    . BACK SURGERY    . Cataract extraction right    . catarect extraction left    . CHOLECYSTECTOMY    . COLONOSCOPY WITH PROPOFOL N/A 12/02/2016   Procedure: COLONOSCOPY WITH PROPOFOL;  Surgeon: Manya Silvas, MD;  Location: [facility] ENDOSCOPY;  Service: Endoscopy;  Laterality: N/A;  . COLONOSCOPY WITH PROPOFOL N/A 11/10/2017   Procedure: COLONOSCOPY WITH PROPOFOL;  Surgeon: Toledo, Benay Pike, MD;  Location: [facility] ENDOSCOPY;  Service: Gastroenterology;  Laterality: N/A;  . colonoscopy with removal lesions by snare    . Endoscoic carpal tunnel release    . ESOPHAGOGASTRODUODENOSCOPY N/A 11/10/2017   Procedure: ESOPHAGOGASTRODUODENOSCOPY (EGD);  Surgeon: Toledo, Benay Pike, MD;  Location: [facility] ENDOSCOPY;  Service: Gastroenterology;  Laterality: N/A;  . ESOPHAGOGASTRODUODENOSCOPY (EGD) WITH PROPOFOL N/A 12/02/2016   Procedure: ESOPHAGOGASTRODUODENOSCOPY (EGD) WITH PROPOFOL;  Surgeon: Manya Silvas, MD;  Location: [facility] ENDOSCOPY;  Service: Endoscopy;  Laterality: N/A;  . IR IMAGING GUIDED PORT INSERTION  10/01/2017  . Laminectomy posterior lumbar facetectomy and formaninotomy w/Decomp    . Laminectony posterior cervicle decomp w/Facectomy and foraminotomy    . VEIN LIGATION AND STRIPPING Left     Family History  Problem Relation Age of Onset  . Coronary artery disease Mother   . Diabetes type II Mother   . Hypertension Mother   . Tuberculosis  Father   . Stroke Father   . Diabetes type II Sister   . Migraines Sister   . Alcohol abuse Brother   . Coronary artery disease Brother   . Diabetes type II Brother   . Kidney cancer Brother   . Kidney disease Brother   . Heart attack Brother   . Hypertension Brother     Social History:  reports that she has never smoked. She has never used smokeless tobacco. She reports that she does not drink alcohol and does not use drugs. She lives in [place]. The patient is alone today.  Allergies:  Allergies  Allergen Reactions  . Aspirin     Other reaction(s): Other (see comments)  . Hydrocodone-Acetaminophen Other (See Comments)    Sedation and GI upset, pt is not allergic to acetaminophen  . Other Other (See Comments)  . Statins Other (See Comments)  . Tramadol-Acetaminophen Nausea And Vomiting    GI upset, pt is not allergic to acetaminophen    Current Medications: Current Outpatient Medications  Medication Sig Dispense Refill  . acetaminophen (TYLENOL) 500 MG tablet Take 500 mg by mouth every 6 (six) hours as needed.     Marland Kitchen albuterol (PROVENTIL HFA;VENTOLIN HFA) 108 (90 Base) MCG/ACT inhaler Inhale 2 puffs into the lungs every 6 (six) hours as needed for wheezing or shortness of breath.    Marland Kitchen amLODipine (NORVASC) 10 MG tablet Take 10 mg by mouth daily.    Marland Kitchen aspirin EC 81 MG tablet Take 81 mg by mouth daily.    . ASSURE COMFORT LANCETS 30G MISC     . ferrous sulfate 325 (65 FE) MG tablet Take 325 mg by mouth daily.    . furosemide (LASIX) 40 MG tablet Take 40 mg by mouth daily.     Marland Kitchen gabapentin (NEURONTIN) 100 MG capsule Take 100 mg by mouth 2 (two) times daily.     Marland Kitchen gemfibrozil (LOPID) 600 MG tablet Take 600 mg by mouth daily.     Marland Kitchen gemfibrozil (LOPID) 600 MG tablet TAKE 1 TABLET BY MOUTH EVERY DAY    . glimepiride (AMARYL) 1 MG tablet Take 0.5 mg by mouth daily with breakfast.     . hydrALAZINE (APRESOLINE) 50 MG tablet Take 25 mg by mouth 3 (three) times daily.   12  . Lancet  Devices (ADJUSTABLE LANCING DEVICE) MISC     . latanoprost (XALATAN) 0.005 % ophthalmic solution Place 1 drop into both eyes.     Marland Kitchen lidocaine-prilocaine (EMLA) cream Apply 1 application topically as needed. 30 g 3  . losartan (COZAAR) 50 MG tablet Take 100 mg by mouth daily.    . Multiple Vitamins-Minerals (CENTRUM SILVER) tablet Take 1 tablet by mouth daily.     . Omega-3 Fatty Acids (FISH OIL) 1000 MG CAPS Take 1,000 mg by mouth daily.     Marland Kitchen omeprazole (PRILOSEC) 20 MG capsule Take 20 mg by mouth daily.     Marland Kitchen omeprazole (PRILOSEC) 20 MG capsule TAKE 1 CAPSULE BY MOUTH EVERY DAY    .  ONE TOUCH ULTRA TEST test strip     . pioglitazone (ACTOS) 30 MG tablet Take 30 mg by mouth daily.     . calcitRIOL (ROCALTROL) 0.25 MCG capsule Take 0.5 mcg by mouth 3 (three) times a week.  (Patient not taking: No sig reported)    . carvedilol (COREG) 6.25 MG tablet Take 6.25 mg by mouth 2 (two) times daily. (Patient not taking: Reported on 03/28/2020)  12  . doxycycline (VIBRAMYCIN) 100 MG capsule Take 1 capsule (100 mg total) by mouth 2 (two) times daily. (Patient not taking: No sig reported) 20 capsule 0  . losartan (COZAAR) 100 MG tablet Take 100 mg by mouth daily.     No current facility-administered medications for this visit.    Review of Systems  Constitutional: Positive for malaise/fatigue (energy level fluctuates). Negative for chills, diaphoresis, fever and weight loss (up 1 lb).       Feels "good."  HENT: Negative.  Negative for congestion, ear discharge, ear pain, hearing loss, nosebleeds, sinus pain, sore throat and tinnitus.   Eyes: Negative.  Negative for blurred vision and double vision.  Respiratory: Positive for shortness of breath (on exertion). Negative for cough, hemoptysis, sputum production and wheezing (uses inhaler).   Cardiovascular: Negative for chest pain, palpitations and leg swelling (wears compression socks).  Gastrointestinal: Negative.  Negative for abdominal pain, blood in  stool, constipation, diarrhea, heartburn, melena, nausea and vomiting.       Eating well. Eating more fruits and vegetables.  Genitourinary: Negative.  Negative for dysuria, frequency, hematuria and urgency.  Musculoskeletal: Negative.  Negative for back pain, myalgias and neck pain.  Skin: Negative.  Negative for itching and rash.  Neurological: Negative.  Negative for dizziness, tingling, sensory change, speech change, focal weakness, weakness and headaches.  Endo/Heme/Allergies: Does not bruise/bleed easily.       Diabetes, under good control.  No thyroid issues.  Psychiatric/Behavioral: Negative.  Negative for depression and memory loss. The patient is not nervous/anxious and does not have insomnia.   All other systems reviewed and are negative.  Performance status (ECOG):  1  Vitals Blood pressure (!) 144/56, pulse 73, temperature (!) 95.8 F (35.4 C), temperature source Tympanic, resp. rate 20, weight 258 lb 9.6 oz (117.3 kg), SpO2 100 %.   Physical Exam Vitals and nursing note reviewed.  Constitutional:      General: She is not in acute distress.    Appearance: She is well-developed. She is not diaphoretic.     Comments: She has a rolling walker by her side.  She needs assistance onto exam room table.  HENT:     Head: Normocephalic and atraumatic.     Mouth/Throat:     Mouth: Mucous membranes are moist.     Pharynx: Oropharynx is clear. No oropharyngeal exudate.  Eyes:     General: No scleral icterus.    Extraocular Movements: Extraocular movements intact.     Conjunctiva/sclera: Conjunctivae normal.     Pupils: Pupils are equal, round, and reactive to light.     Comments: Glasses.  Brown eyes.  Neck:     Vascular: No JVD.  Cardiovascular:     Rate and Rhythm: Normal rate and regular rhythm.     Heart sounds: Normal heart sounds. No murmur heard. No gallop.   Pulmonary:     Effort: Pulmonary effort is normal. No respiratory distress.     Breath sounds: Normal breath  sounds. No wheezing or rales.  Chest:  Chest wall: No tenderness.  Breasts:     Right: No axillary adenopathy or supraclavicular adenopathy.     Left: No axillary adenopathy or supraclavicular adenopathy.    Abdominal:     General: Bowel sounds are normal. There is no distension.     Palpations: Abdomen is soft. There is no mass.     Tenderness: There is no abdominal tenderness. There is no guarding or rebound.  Musculoskeletal:        General: No tenderness. Normal range of motion.     Cervical back: Normal range of motion and neck supple.     Comments: Wearing support stockings  Lymphadenopathy:     Head:     Right side of head: No preauricular, posterior auricular or occipital adenopathy.     Left side of head: No preauricular, posterior auricular or occipital adenopathy.     Cervical: No cervical adenopathy.     Upper Body:     Right upper body: No supraclavicular or axillary adenopathy.     Left upper body: No supraclavicular or axillary adenopathy.     Lower Body: No right inguinal adenopathy. No left inguinal adenopathy.  Skin:    General: Skin is warm and dry.     Coloration: Skin is not pale.     Findings: No erythema or rash.  Neurological:     Mental Status: She is alert and oriented to person, place, and time.     Deep Tendon Reflexes: Reflexes are normal and symmetric.  Psychiatric:        Behavior: Behavior normal.        Thought Content: Thought content normal.        Judgment: Judgment normal.    Appointment on 03/28/2020  Component Date Value Ref Range Status  . Sodium 03/28/2020 138  135 - 145 mmol/L Final  . Potassium 03/28/2020 4.8  3.5 - 5.1 mmol/L Final  . Chloride 03/28/2020 105  98 - 111 mmol/L Final  . CO2 03/28/2020 22  22 - 32 mmol/L Final  . Glucose, Bld 03/28/2020 110* 70 - 99 mg/dL Final   Glucose reference range applies only to samples taken after fasting for at least 8 hours.  . BUN 03/28/2020 58* 8 - 23 mg/dL Final  . Creatinine, Ser  03/28/2020 1.89* 0.44 - 1.00 mg/dL Final  . Calcium 03/28/2020 9.9  8.9 - 10.3 mg/dL Final  . GFR, Estimated 03/28/2020 26* >60 mL/min Final   Comment: (NOTE) Calculated using the CKD-EPI Creatinine Equation (2021)   . Anion gap 03/28/2020 11  5 - 15 Final   Performed at [facility], [address], [place], [place] [address]  . WBC 03/28/2020 3.6* 4.0 - 10.5 K/uL Final  . RBC 03/28/2020 2.87* 3.87 - 5.11 MIL/uL Final  . Hemoglobin 03/28/2020 9.2* 12.0 - 15.0 g/dL Final  . HCT 03/28/2020 29.6* 36.0 - 46.0 % Final  . MCV 03/28/2020 103.1* 80.0 - 100.0 fL Final  . MCH 03/28/2020 32.1  26.0 - 34.0 pg Final  . MCHC 03/28/2020 31.1  30.0 - 36.0 g/dL Final  . RDW 03/28/2020 14.6  11.5 - 15.5 % Final  . Platelets 03/28/2020 153  150 - 400 K/uL Final  . nRBC 03/28/2020 0.0  0.0 - 0.2 % Final  . Neutrophils Relative % 03/28/2020 52  % Final  . Neutro Abs 03/28/2020 1.8  1.7 - 7.7 K/uL Final  . Lymphocytes Relative 03/28/2020 29  % Final  . Lymphs Abs 03/28/2020 1.1  0.7 - 4.0 K/uL Final  . Monocytes Relative 03/28/2020 12  % Final  . Monocytes Absolute 03/28/2020 0.4  0.1 - 1.0 K/uL Final  . Eosinophils Relative 03/28/2020 5  % Final  . Eosinophils Absolute 03/28/2020 0.2  0.0 - 0.5 K/uL Final  . Basophils Relative 03/28/2020 2  % Final  . Basophils Absolute 03/28/2020 0.1  0.0 - 0.1 K/uL Final  . Immature Granulocytes 03/28/2020 0  % Final  . Abs Immature Granulocytes 03/28/2020 0.00  0.00 - 0.07 K/uL Final   Performed at [facility], [address], [place], [place] [address]    Assessment:  Sara Bright is a 80 y.o. female withanemia of chronic kidney disease.She has stage III chronic kidney disease. SPEP and free light chain ratio was normal on 11/12/2015.  She receivedAranesp 200 mcg on 09/02/2016, 12/22/2016, 03/23/2017, 07/20/2017, 08/17/2017, and 09/15/2017. She received Aranesp 300 mcgon 10/06/2017, 10/19/2017, 01/04/2018,  03/08/2018,and03/05/2018.She began Retacrit10,000 units on 07/04/2018 (last 03/13/2019).  She received 1 unit of PRBCson 09/22/2017. She received Venoferx 4 (10/22/2015 - 11/05/2015), x4 (01/21/2016 - 02/11/2016), 11/24/2016, 06/22/2017, 09/14/2017, 10/01/2017, 10/12/2017, 10/26/2017, 11/16/2017, and 03/01/2018.  Ferritinhas been followed: 63 on 10/08/2015, 290 on 01/14/2016, 651 on 05/12/2016, 467 on 09/01/2016, 413 on 11/19/2016, 633 on 12/22/2016, 374 on 01/19/2017, 480 on 06/22/2017, 135 on 09/22/2017, 159 on 10/06/2017, 373 on 03/01/2018, 289 on 03/29/2018, 257 on 07/04/2018, 217 on 08/30/2018, 178 on 11/08/2018, 191 on 01/31/2019, 145 on 04/25/2019, 153 on 07/18/2019, 132 on 10/10/2019, and 122 on 01/02/2020.   EGDon 09/04/2019revealed an irregularZ-line irregular, at the gastroesophageal junction, normal stomach, and normal duodenum.Colonoscopyon 11/10/2017 revealed diverticulosis in the sigmoid colonand non-bleeding internal hemorrhoids.  She has macrocytic RBC indices. B12, folate, and TSH were normal on 05/09/2019.  She received the Moderna COVID-19 vaccine on 04/19/2019 and 05/17/2019.   Symptomatically, she feels "good." Her energy level fluctuates.  Exam is stable.  Hemoglobin is 9.2.  Plan: 1.   Labs today: CBC with diff, BMP, ferritin, iron studies.  2.Anemia of chronic renal disease Hematocrit29.6. Hemoglobin9.2.  MCV 103.1 Ferritin89 with an iron saturation of 18% and a TIBC of 391.   Maintain ferritin >100. Discuss hemoglobin range 8.8 - 9.3 since last visit.  Discuss increasing Retacrit to 20,000 units a week.    Patient in agreement.  Retacrit 20,000 units today. 3.Macrocytosis MCV 103.1. She has had macrocytic RBC indices since07/26/2019. She has no known liver disease.  She may have an underlying myelodysplastic syndrome. B12  was727,folate25,andTSH 5.026 on 05/09/2019. Continue to monitor. 4.Renal insufficiency Creatinine1.89. She is followed by Dr. Holley Raring. 5.   Retacrit 20,000 units today. 6.   RTC every 2 weeks x 6 for labs (HCT/Hgb) and +/- Retacrit.  7.   RTC in 12 weeks for MD assessment, labs (CBC with diff, BMP, ferritin, iron studies, B12, folate, TSH) and +/- Retacrit.  I discussed the assessment and treatment plan with the patient.  The patient was provided an opportunity to ask questions and all were answered.  The patient agreed with the plan and demonstrated an understanding of the instructions.  The patient was advised to call back if the symptoms worsen or if the condition fails to improve as anticipated.  I provided 17 minutes of face-to-face time during this this encounter and > 50% was spent counseling as documented under my assessment and plan.  An additional 5-10 minutes were spent reviewing her chart (Epic and Care Everywhere) including notes, labs, and imaging studies.    Gervase Colberg  Elmarie Mainland, MD, PhD    03/28/2020, 11:31 AM  I, Mirian Mo Tufford, am acting as Education administrator for [employer]. Mike Gip, MD, PhD.  I, Peta Peachey C. Mike Gip, MD, have reviewed the above documentation for accuracy and completeness, and I agree with the above.

## 2020-03-28 ENCOUNTER — Encounter: Payer: Self-pay | Admitting: Hematology and Oncology

## 2020-03-28 ENCOUNTER — Inpatient Hospital Stay: Payer: Medicare HMO | Admitting: Hematology and Oncology

## 2020-03-28 ENCOUNTER — Other Ambulatory Visit: Payer: Self-pay | Admitting: Hematology and Oncology

## 2020-03-28 ENCOUNTER — Inpatient Hospital Stay: Payer: Medicare HMO

## 2020-03-28 ENCOUNTER — Other Ambulatory Visit: Payer: Self-pay

## 2020-03-28 VITALS — BP 144/56 | HR 73 | Temp 95.8°F | Resp 20 | Wt 258.6 lb

## 2020-03-28 DIAGNOSIS — N1832 Chronic kidney disease, stage 3b: Secondary | ICD-10-CM

## 2020-03-28 DIAGNOSIS — D7589 Other specified diseases of blood and blood-forming organs: Secondary | ICD-10-CM

## 2020-03-28 DIAGNOSIS — D631 Anemia in chronic kidney disease: Secondary | ICD-10-CM

## 2020-03-28 DIAGNOSIS — Z79899 Other long term (current) drug therapy: Secondary | ICD-10-CM | POA: Diagnosis not present

## 2020-03-28 DIAGNOSIS — N183 Chronic kidney disease, stage 3 unspecified: Secondary | ICD-10-CM

## 2020-03-28 LAB — CBC WITH DIFFERENTIAL/PLATELET
Abs Immature Granulocytes: 0 10*3/uL (ref 0.00–0.07)
Basophils Absolute: 0.1 10*3/uL (ref 0.0–0.1)
Basophils Relative: 2 %
Eosinophils Absolute: 0.2 10*3/uL (ref 0.0–0.5)
Eosinophils Relative: 5 %
HCT: 29.6 % — ABNORMAL LOW (ref 36.0–46.0)
Hemoglobin: 9.2 g/dL — ABNORMAL LOW (ref 12.0–15.0)
Immature Granulocytes: 0 %
Lymphocytes Relative: 29 %
Lymphs Abs: 1.1 10*3/uL (ref 0.7–4.0)
MCH: 32.1 pg (ref 26.0–34.0)
MCHC: 31.1 g/dL (ref 30.0–36.0)
MCV: 103.1 fL — ABNORMAL HIGH (ref 80.0–100.0)
Monocytes Absolute: 0.4 10*3/uL (ref 0.1–1.0)
Monocytes Relative: 12 %
Neutro Abs: 1.8 10*3/uL (ref 1.7–7.7)
Neutrophils Relative %: 52 %
Platelets: 153 10*3/uL (ref 150–400)
RBC: 2.87 MIL/uL — ABNORMAL LOW (ref 3.87–5.11)
RDW: 14.6 % (ref 11.5–15.5)
WBC: 3.6 10*3/uL — ABNORMAL LOW (ref 4.0–10.5)
nRBC: 0 % (ref 0.0–0.2)

## 2020-03-28 LAB — FERRITIN: Ferritin: 89 ng/mL (ref 11–307)

## 2020-03-28 LAB — IRON AND TIBC
Iron: 70 ug/dL (ref 28–170)
Saturation Ratios: 18 % (ref 10.4–31.8)
TIBC: 391 ug/dL (ref 250–450)
UIBC: 321 ug/dL

## 2020-03-28 LAB — BASIC METABOLIC PANEL
Anion gap: 11 (ref 5–15)
BUN: 58 mg/dL — ABNORMAL HIGH (ref 8–23)
CO2: 22 mmol/L (ref 22–32)
Calcium: 9.9 mg/dL (ref 8.9–10.3)
Chloride: 105 mmol/L (ref 98–111)
Creatinine, Ser: 1.89 mg/dL — ABNORMAL HIGH (ref 0.44–1.00)
GFR, Estimated: 26 mL/min — ABNORMAL LOW (ref >60)
Glucose, Bld: 110 mg/dL — ABNORMAL HIGH (ref 70–99)
Potassium: 4.8 mmol/L (ref 3.5–5.1)
Sodium: 138 mmol/L (ref 135–145)

## 2020-03-28 MED ORDER — EPOETIN ALFA-EPBX 10000 UNIT/ML IJ SOLN
20000.0000 [IU] | Freq: Once | INTRAMUSCULAR | Status: AC
  Administered 2020-03-28: 20000 [IU] via SUBCUTANEOUS

## 2020-03-28 NOTE — Progress Notes (Signed)
Patient here for oncology follow-up appointment, expresses concerns of shortness of breath and dizziness.

## 2020-04-08 ENCOUNTER — Other Ambulatory Visit: Payer: Self-pay

## 2020-04-08 DIAGNOSIS — N1832 Chronic kidney disease, stage 3b: Secondary | ICD-10-CM

## 2020-04-08 DIAGNOSIS — D631 Anemia in chronic kidney disease: Secondary | ICD-10-CM

## 2020-04-11 ENCOUNTER — Inpatient Hospital Stay: Payer: Medicare HMO | Attending: Hematology and Oncology

## 2020-04-11 ENCOUNTER — Other Ambulatory Visit: Payer: Self-pay

## 2020-04-11 ENCOUNTER — Inpatient Hospital Stay: Payer: Medicare HMO

## 2020-04-11 VITALS — BP 148/69 | HR 72 | Resp 20

## 2020-04-11 DIAGNOSIS — R5383 Other fatigue: Secondary | ICD-10-CM | POA: Insufficient documentation

## 2020-04-11 DIAGNOSIS — D631 Anemia in chronic kidney disease: Secondary | ICD-10-CM | POA: Diagnosis present

## 2020-04-11 DIAGNOSIS — Z811 Family history of alcohol abuse and dependence: Secondary | ICD-10-CM | POA: Diagnosis not present

## 2020-04-11 DIAGNOSIS — Z836 Family history of other diseases of the respiratory system: Secondary | ICD-10-CM | POA: Diagnosis not present

## 2020-04-11 DIAGNOSIS — Z8051 Family history of malignant neoplasm of kidney: Secondary | ICD-10-CM | POA: Diagnosis not present

## 2020-04-11 DIAGNOSIS — N1832 Chronic kidney disease, stage 3b: Secondary | ICD-10-CM | POA: Insufficient documentation

## 2020-04-11 DIAGNOSIS — I129 Hypertensive chronic kidney disease with stage 1 through stage 4 chronic kidney disease, or unspecified chronic kidney disease: Secondary | ICD-10-CM | POA: Insufficient documentation

## 2020-04-11 DIAGNOSIS — Z833 Family history of diabetes mellitus: Secondary | ICD-10-CM | POA: Diagnosis not present

## 2020-04-11 DIAGNOSIS — Z841 Family history of disorders of kidney and ureter: Secondary | ICD-10-CM | POA: Diagnosis not present

## 2020-04-11 DIAGNOSIS — D7589 Other specified diseases of blood and blood-forming organs: Secondary | ICD-10-CM | POA: Diagnosis not present

## 2020-04-11 DIAGNOSIS — Z8249 Family history of ischemic heart disease and other diseases of the circulatory system: Secondary | ICD-10-CM | POA: Insufficient documentation

## 2020-04-11 DIAGNOSIS — R0602 Shortness of breath: Secondary | ICD-10-CM | POA: Diagnosis not present

## 2020-04-11 DIAGNOSIS — Z823 Family history of stroke: Secondary | ICD-10-CM | POA: Insufficient documentation

## 2020-04-11 DIAGNOSIS — Z79899 Other long term (current) drug therapy: Secondary | ICD-10-CM | POA: Insufficient documentation

## 2020-04-11 DIAGNOSIS — N183 Chronic kidney disease, stage 3 unspecified: Secondary | ICD-10-CM

## 2020-04-11 LAB — HEMATOCRIT: HCT: 30 % — ABNORMAL LOW (ref 36.0–46.0)

## 2020-04-11 LAB — HEMOGLOBIN: Hemoglobin: 9.3 g/dL — ABNORMAL LOW (ref 12.0–15.0)

## 2020-04-11 MED ORDER — EPOETIN ALFA-EPBX 10000 UNIT/ML IJ SOLN
20000.0000 [IU] | Freq: Once | INTRAMUSCULAR | Status: AC
  Administered 2020-04-11: 20000 [IU] via SUBCUTANEOUS

## 2020-04-11 MED ORDER — EPOETIN ALFA-EPBX 40000 UNIT/ML IJ SOLN: 20000.0000 [IU] | Freq: Once | INTRAMUSCULAR | Status: DC

## 2020-04-16 DIAGNOSIS — G4733 Obstructive sleep apnea (adult) (pediatric): Secondary | ICD-10-CM | POA: Diagnosis not present

## 2020-04-19 DIAGNOSIS — I890 Lymphedema, not elsewhere classified: Secondary | ICD-10-CM | POA: Diagnosis not present

## 2020-04-22 DIAGNOSIS — G4733 Obstructive sleep apnea (adult) (pediatric): Secondary | ICD-10-CM | POA: Diagnosis not present

## 2020-04-24 DIAGNOSIS — H409 Unspecified glaucoma: Secondary | ICD-10-CM | POA: Diagnosis not present

## 2020-04-24 DIAGNOSIS — Z6841 Body Mass Index (BMI) 40.0 and over, adult: Secondary | ICD-10-CM | POA: Diagnosis not present

## 2020-04-24 DIAGNOSIS — E1142 Type 2 diabetes mellitus with diabetic polyneuropathy: Secondary | ICD-10-CM | POA: Diagnosis not present

## 2020-04-24 DIAGNOSIS — E785 Hyperlipidemia, unspecified: Secondary | ICD-10-CM | POA: Diagnosis not present

## 2020-04-24 DIAGNOSIS — G8929 Other chronic pain: Secondary | ICD-10-CM | POA: Diagnosis not present

## 2020-04-24 DIAGNOSIS — D649 Anemia, unspecified: Secondary | ICD-10-CM | POA: Diagnosis not present

## 2020-04-24 DIAGNOSIS — J449 Chronic obstructive pulmonary disease, unspecified: Secondary | ICD-10-CM | POA: Diagnosis not present

## 2020-04-24 DIAGNOSIS — E1151 Type 2 diabetes mellitus with diabetic peripheral angiopathy without gangrene: Secondary | ICD-10-CM | POA: Diagnosis not present

## 2020-04-24 DIAGNOSIS — Z008 Encounter for other general examination: Secondary | ICD-10-CM | POA: Diagnosis not present

## 2020-04-24 DIAGNOSIS — G4733 Obstructive sleep apnea (adult) (pediatric): Secondary | ICD-10-CM | POA: Diagnosis not present

## 2020-04-25 ENCOUNTER — Inpatient Hospital Stay: Payer: Medicare HMO

## 2020-04-25 ENCOUNTER — Other Ambulatory Visit: Payer: Self-pay

## 2020-04-25 VITALS — BP 138/52 | HR 67

## 2020-04-25 DIAGNOSIS — Z833 Family history of diabetes mellitus: Secondary | ICD-10-CM | POA: Diagnosis not present

## 2020-04-25 DIAGNOSIS — D631 Anemia in chronic kidney disease: Secondary | ICD-10-CM

## 2020-04-25 DIAGNOSIS — D7589 Other specified diseases of blood and blood-forming organs: Secondary | ICD-10-CM | POA: Diagnosis not present

## 2020-04-25 DIAGNOSIS — N1832 Chronic kidney disease, stage 3b: Secondary | ICD-10-CM

## 2020-04-25 DIAGNOSIS — N183 Chronic kidney disease, stage 3 unspecified: Secondary | ICD-10-CM

## 2020-04-25 DIAGNOSIS — R0602 Shortness of breath: Secondary | ICD-10-CM | POA: Diagnosis not present

## 2020-04-25 DIAGNOSIS — Z8249 Family history of ischemic heart disease and other diseases of the circulatory system: Secondary | ICD-10-CM | POA: Diagnosis not present

## 2020-04-25 DIAGNOSIS — Z836 Family history of other diseases of the respiratory system: Secondary | ICD-10-CM | POA: Diagnosis not present

## 2020-04-25 DIAGNOSIS — I129 Hypertensive chronic kidney disease with stage 1 through stage 4 chronic kidney disease, or unspecified chronic kidney disease: Secondary | ICD-10-CM | POA: Diagnosis not present

## 2020-04-25 DIAGNOSIS — R5383 Other fatigue: Secondary | ICD-10-CM | POA: Diagnosis not present

## 2020-04-25 DIAGNOSIS — Z79899 Other long term (current) drug therapy: Secondary | ICD-10-CM | POA: Diagnosis not present

## 2020-04-25 LAB — HEMATOCRIT: HCT: 30.4 % — ABNORMAL LOW (ref 36.0–46.0)

## 2020-04-25 LAB — HEMOGLOBIN: Hemoglobin: 9.4 g/dL — ABNORMAL LOW (ref 12.0–15.0)

## 2020-04-25 MED ORDER — EPOETIN ALFA-EPBX 10000 UNIT/ML IJ SOLN
INTRAMUSCULAR | Status: AC
  Filled 2020-04-25: qty 2

## 2020-04-25 MED ORDER — EPOETIN ALFA-EPBX 10000 UNIT/ML IJ SOLN
20000.0000 [IU] | Freq: Once | INTRAMUSCULAR | Status: AC
  Administered 2020-04-25: 20000 [IU] via SUBCUTANEOUS

## 2020-05-02 ENCOUNTER — Other Ambulatory Visit: Payer: Self-pay

## 2020-05-09 ENCOUNTER — Other Ambulatory Visit: Payer: Self-pay

## 2020-05-09 ENCOUNTER — Inpatient Hospital Stay: Payer: Medicare HMO

## 2020-05-09 ENCOUNTER — Inpatient Hospital Stay: Payer: Medicare HMO | Attending: Hematology and Oncology

## 2020-05-09 VITALS — BP 149/66 | HR 74 | Resp 20

## 2020-05-09 DIAGNOSIS — Z79899 Other long term (current) drug therapy: Secondary | ICD-10-CM | POA: Diagnosis not present

## 2020-05-09 DIAGNOSIS — D631 Anemia in chronic kidney disease: Secondary | ICD-10-CM

## 2020-05-09 DIAGNOSIS — Z823 Family history of stroke: Secondary | ICD-10-CM | POA: Diagnosis not present

## 2020-05-09 DIAGNOSIS — N1832 Chronic kidney disease, stage 3b: Secondary | ICD-10-CM | POA: Insufficient documentation

## 2020-05-09 DIAGNOSIS — N183 Chronic kidney disease, stage 3 unspecified: Secondary | ICD-10-CM

## 2020-05-09 DIAGNOSIS — Z836 Family history of other diseases of the respiratory system: Secondary | ICD-10-CM | POA: Insufficient documentation

## 2020-05-09 DIAGNOSIS — Z8249 Family history of ischemic heart disease and other diseases of the circulatory system: Secondary | ICD-10-CM | POA: Diagnosis not present

## 2020-05-09 DIAGNOSIS — Z833 Family history of diabetes mellitus: Secondary | ICD-10-CM | POA: Diagnosis not present

## 2020-05-09 DIAGNOSIS — R0602 Shortness of breath: Secondary | ICD-10-CM | POA: Insufficient documentation

## 2020-05-09 DIAGNOSIS — Z8051 Family history of malignant neoplasm of kidney: Secondary | ICD-10-CM | POA: Insufficient documentation

## 2020-05-09 DIAGNOSIS — Z811 Family history of alcohol abuse and dependence: Secondary | ICD-10-CM | POA: Diagnosis not present

## 2020-05-09 DIAGNOSIS — R5383 Other fatigue: Secondary | ICD-10-CM | POA: Insufficient documentation

## 2020-05-09 DIAGNOSIS — D7589 Other specified diseases of blood and blood-forming organs: Secondary | ICD-10-CM | POA: Insufficient documentation

## 2020-05-09 DIAGNOSIS — Z841 Family history of disorders of kidney and ureter: Secondary | ICD-10-CM | POA: Diagnosis not present

## 2020-05-09 LAB — HEMOGLOBIN: Hemoglobin: 9.6 g/dL — ABNORMAL LOW (ref 12.0–15.0)

## 2020-05-09 LAB — HEMATOCRIT: HCT: 30.6 % — ABNORMAL LOW (ref 36.0–46.0)

## 2020-05-09 MED ORDER — EPOETIN ALFA-EPBX 10000 UNIT/ML IJ SOLN
20000.0000 [IU] | Freq: Once | INTRAMUSCULAR | Status: AC
  Administered 2020-05-09: 20000 [IU] via SUBCUTANEOUS

## 2020-05-20 DIAGNOSIS — G4733 Obstructive sleep apnea (adult) (pediatric): Secondary | ICD-10-CM | POA: Diagnosis not present

## 2020-05-23 ENCOUNTER — Inpatient Hospital Stay: Payer: Medicare HMO

## 2020-05-23 ENCOUNTER — Other Ambulatory Visit: Payer: Self-pay

## 2020-05-23 VITALS — BP 153/67 | HR 69 | Resp 20

## 2020-05-23 DIAGNOSIS — N183 Chronic kidney disease, stage 3 unspecified: Secondary | ICD-10-CM

## 2020-05-23 DIAGNOSIS — Z79899 Other long term (current) drug therapy: Secondary | ICD-10-CM | POA: Diagnosis not present

## 2020-05-23 DIAGNOSIS — Z8249 Family history of ischemic heart disease and other diseases of the circulatory system: Secondary | ICD-10-CM | POA: Diagnosis not present

## 2020-05-23 DIAGNOSIS — R0602 Shortness of breath: Secondary | ICD-10-CM | POA: Diagnosis not present

## 2020-05-23 DIAGNOSIS — N1832 Chronic kidney disease, stage 3b: Secondary | ICD-10-CM | POA: Diagnosis not present

## 2020-05-23 DIAGNOSIS — Z833 Family history of diabetes mellitus: Secondary | ICD-10-CM | POA: Diagnosis not present

## 2020-05-23 DIAGNOSIS — R5383 Other fatigue: Secondary | ICD-10-CM | POA: Diagnosis not present

## 2020-05-23 DIAGNOSIS — D631 Anemia in chronic kidney disease: Secondary | ICD-10-CM | POA: Diagnosis not present

## 2020-05-23 DIAGNOSIS — Z836 Family history of other diseases of the respiratory system: Secondary | ICD-10-CM | POA: Diagnosis not present

## 2020-05-23 DIAGNOSIS — D7589 Other specified diseases of blood and blood-forming organs: Secondary | ICD-10-CM

## 2020-05-23 DIAGNOSIS — Z823 Family history of stroke: Secondary | ICD-10-CM | POA: Diagnosis not present

## 2020-05-23 LAB — HEMATOCRIT: HCT: 31.6 % — ABNORMAL LOW (ref 36.0–46.0)

## 2020-05-23 LAB — HEMOGLOBIN: Hemoglobin: 9.8 g/dL — ABNORMAL LOW (ref 12.0–15.0)

## 2020-05-23 MED ORDER — EPOETIN ALFA-EPBX 10000 UNIT/ML IJ SOLN
20000.0000 [IU] | Freq: Once | INTRAMUSCULAR | Status: AC
  Administered 2020-05-23: 20000 [IU] via SUBCUTANEOUS
  Filled 2020-05-23: qty 2

## 2020-05-23 MED ORDER — EPOETIN ALFA-EPBX 40000 UNIT/ML IJ SOLN: 20000.0000 [IU] | Freq: Once | INTRAMUSCULAR | Status: DC

## 2020-05-23 MED ORDER — EPOETIN ALFA-EPBX 20000 UNIT/ML IJ SOLN
20000.0000 [IU] | Freq: Once | INTRAMUSCULAR | Status: DC
  Filled 2020-05-23: qty 1

## 2020-06-03 DIAGNOSIS — H401111 Primary open-angle glaucoma, right eye, mild stage: Secondary | ICD-10-CM | POA: Diagnosis not present

## 2020-06-06 ENCOUNTER — Inpatient Hospital Stay: Payer: Medicare HMO

## 2020-06-06 ENCOUNTER — Other Ambulatory Visit: Payer: Self-pay

## 2020-06-06 VITALS — BP 142/64 | HR 67 | Temp 96.2°F | Resp 20

## 2020-06-06 DIAGNOSIS — Z79899 Other long term (current) drug therapy: Secondary | ICD-10-CM | POA: Diagnosis not present

## 2020-06-06 DIAGNOSIS — N1832 Chronic kidney disease, stage 3b: Secondary | ICD-10-CM

## 2020-06-06 DIAGNOSIS — Z836 Family history of other diseases of the respiratory system: Secondary | ICD-10-CM | POA: Diagnosis not present

## 2020-06-06 DIAGNOSIS — D631 Anemia in chronic kidney disease: Secondary | ICD-10-CM

## 2020-06-06 DIAGNOSIS — R0602 Shortness of breath: Secondary | ICD-10-CM | POA: Diagnosis not present

## 2020-06-06 DIAGNOSIS — N183 Chronic kidney disease, stage 3 unspecified: Secondary | ICD-10-CM

## 2020-06-06 DIAGNOSIS — Z823 Family history of stroke: Secondary | ICD-10-CM | POA: Diagnosis not present

## 2020-06-06 DIAGNOSIS — Z833 Family history of diabetes mellitus: Secondary | ICD-10-CM | POA: Diagnosis not present

## 2020-06-06 DIAGNOSIS — R5383 Other fatigue: Secondary | ICD-10-CM | POA: Diagnosis not present

## 2020-06-06 DIAGNOSIS — Z8249 Family history of ischemic heart disease and other diseases of the circulatory system: Secondary | ICD-10-CM | POA: Diagnosis not present

## 2020-06-06 DIAGNOSIS — D7589 Other specified diseases of blood and blood-forming organs: Secondary | ICD-10-CM

## 2020-06-06 LAB — HEMATOCRIT: HCT: 31.2 % — ABNORMAL LOW (ref 36.0–46.0)

## 2020-06-06 LAB — HEMOGLOBIN: Hemoglobin: 9.8 g/dL — ABNORMAL LOW (ref 12.0–15.0)

## 2020-06-06 MED ORDER — EPOETIN ALFA-EPBX 10000 UNIT/ML IJ SOLN
20000.0000 [IU] | Freq: Once | INTRAMUSCULAR | Status: AC
  Administered 2020-06-06: 20000 [IU] via SUBCUTANEOUS

## 2020-06-06 MED ORDER — EPOETIN ALFA-EPBX 40000 UNIT/ML IJ SOLN: 20000.0000 [IU] | Freq: Once | INTRAMUSCULAR | Status: DC

## 2020-06-10 DIAGNOSIS — H401111 Primary open-angle glaucoma, right eye, mild stage: Secondary | ICD-10-CM | POA: Diagnosis not present

## 2020-06-18 ENCOUNTER — Other Ambulatory Visit: Payer: Self-pay

## 2020-06-18 DIAGNOSIS — N183 Chronic kidney disease, stage 3 unspecified: Secondary | ICD-10-CM

## 2020-06-18 DIAGNOSIS — D631 Anemia in chronic kidney disease: Secondary | ICD-10-CM

## 2020-06-18 DIAGNOSIS — N1832 Chronic kidney disease, stage 3b: Secondary | ICD-10-CM

## 2020-06-19 NOTE — Progress Notes (Signed)
[facility]  [address] [place], [place] [address] Phone: [phone]  Fax: [phone]   Clinic Day:  06/20/2020  Referring physician: Anthonette Legato, MD  Chief Complaint: Sara Bright is a 80 y.o. female with anemia of chronic kidney disease who is seen for 12 week assessment.   HPI: The patient was last seen in the hematology clinic on 03/28/2020. At that time, she felt "good." Her energy level fluctuated.  Exam was stable. Hematocrit was 29.6, hemoglobin 9.2, MCV 153,000 platelets 153,000, WBC 3,600. BUN was 58. Creatinine was 1.89 (CrCl 26 ml/min). Ferritin was 89 with an iron saturation of 18% and a TIBC of 391. She received Retacrit 20,000 units.  Labs followed: 04/11/2020: Hematocrit 30.0. Hemoglobin 9.3. 04/25/2020: Hematocrit 30.4. Hemoglobin 9.4. 05/09/2020: Hematocrit 30.6. Hemoglobin 9.6. 05/23/2020: Hematocrit 31.6. Hemoglobin 9.8. 06/06/2020: Hematocrit 31.2. Hemoglobin 9.8.  She received Retacrit 20,000 units biweekly x 5 (04/11/2020 - 06/06/2020).  During the interim, she has been "wonderful." Her energy level fluctuates. She notices that her energy improves for a few days after receiving Retacrit. Her shortness of breath on exertion is stable. She has not been using her inhaler.   She is wearing compression socks. She has decreased strength on the right side. She was having some discomfort around her port because it had not been flushed since 03/12/2020.  Her diabetes is under control.   Past Medical History:  Diagnosis Date  . Adenoma of colon   . Adenomatous colon polyp   . Anemia    IDA  . Cataract   . Cervical stenosis of spinal canal   . Chronic kidney disease    stage 3  . Diabetes mellitus without complication ([place])   . GERD (gastroesophageal reflux disease)   . Glaucoma   . Heme positive stool   . History of UTI   . Hx of gallstones   . Hyperkalemia   . Hyperlipidemia   . Hyperlipidemia   .  Hypertension   . Lumbar stenosis   . Lymphedema   . Neuromuscular disorder ([place])   . Neuropathy associated with endocrine disorder ([place])   . Obesity   . OSA on CPAP    on CPAP  . Osteoarthritis   . Osteoarthritis   . Port-A-Cath in place   . Retinopathy due to secondary diabetes ([place])   . Secondary hyperparathyroidism of renal origin [facility])     Past Surgical History:  Procedure Laterality Date  . ACDF X2    . BACK SURGERY    . Cataract extraction right    . catarect extraction left    . CHOLECYSTECTOMY    . COLONOSCOPY WITH PROPOFOL N/A 12/02/2016   Procedure: COLONOSCOPY WITH PROPOFOL;  Surgeon: Manya Silvas, MD;  Location: [facility] ENDOSCOPY;  Service: Endoscopy;  Laterality: N/A;  . COLONOSCOPY WITH PROPOFOL N/A 11/10/2017   Procedure: COLONOSCOPY WITH PROPOFOL;  Surgeon: Toledo, Benay Pike, MD;  Location: [facility] ENDOSCOPY;  Service: Gastroenterology;  Laterality: N/A;  . colonoscopy with removal lesions by snare    . Endoscoic carpal tunnel release    . ESOPHAGOGASTRODUODENOSCOPY N/A 11/10/2017   Procedure: ESOPHAGOGASTRODUODENOSCOPY (EGD);  Surgeon: Toledo, Benay Pike, MD;  Location: [facility] ENDOSCOPY;  Service: Gastroenterology;  Laterality: N/A;  . ESOPHAGOGASTRODUODENOSCOPY (EGD) WITH PROPOFOL N/A 12/02/2016   Procedure: ESOPHAGOGASTRODUODENOSCOPY (EGD) WITH PROPOFOL;  Surgeon: Manya Silvas, MD;  Location: [facility] ENDOSCOPY;  Service: Endoscopy;  Laterality: N/A;  . IR IMAGING GUIDED PORT INSERTION  10/01/2017  . Laminectomy posterior lumbar  facetectomy and formaninotomy w/Decomp    . Laminectony posterior cervicle decomp w/Facectomy and foraminotomy    . VEIN LIGATION AND STRIPPING Left     Family History  Problem Relation Age of Onset  . Coronary artery disease Mother   . Diabetes type II Mother   . Hypertension Mother   . Tuberculosis Father   . Stroke Father   . Diabetes type II Sister   . Migraines Sister   . Alcohol abuse Brother   . Coronary artery disease Brother    . Diabetes type II Brother   . Kidney cancer Brother   . Kidney disease Brother   . Heart attack Brother   . Hypertension Brother     Social History:  reports that she has never smoked. She has never used smokeless tobacco. She reports that she does not drink alcohol and does not use drugs. She lives in [place]. The patient is alone today.  Allergies:  Allergies  Allergen Reactions  . Aspirin     Other reaction(s): Other (see comments)  . Hydrocodone-Acetaminophen Other (See Comments)    Sedation and GI upset, pt is not allergic to acetaminophen  . Other Other (See Comments)  . Statins Other (See Comments)  . Tramadol-Acetaminophen Nausea And Vomiting    GI upset, pt is not allergic to acetaminophen    Current Medications: Current Outpatient Medications  Medication Sig Dispense Refill  . acetaminophen (TYLENOL) 500 MG tablet Take 500 mg by mouth every 6 (six) hours as needed.     Marland Kitchen amLODipine (NORVASC) 10 MG tablet Take 10 mg by mouth daily.    Marland Kitchen aspirin EC 81 MG tablet Take 81 mg by mouth daily.    . ASSURE COMFORT LANCETS 30G MISC     . calcitRIOL (ROCALTROL) 0.25 MCG capsule Take 0.5 mcg by mouth 3 (three) times a week.    . carvedilol (COREG) 6.25 MG tablet Take 6.25 mg by mouth 2 (two) times daily.  12  . ferrous sulfate 325 (65 FE) MG tablet Take 325 mg by mouth daily.    . furosemide (LASIX) 40 MG tablet Take 40 mg by mouth daily.     Marland Kitchen gabapentin (NEURONTIN) 100 MG capsule Take 100 mg by mouth 2 (two) times daily.     Marland Kitchen gemfibrozil (LOPID) 600 MG tablet Take 600 mg by mouth daily.     Marland Kitchen glimepiride (AMARYL) 1 MG tablet Take 0.5 mg by mouth daily with breakfast.     . hydrALAZINE (APRESOLINE) 50 MG tablet Take 25 mg by mouth 3 (three) times daily.   12  . Lancet Devices (ADJUSTABLE LANCING DEVICE) MISC     . latanoprost (XALATAN) 0.005 % ophthalmic solution Place 1 drop into both eyes.     Marland Kitchen lidocaine-prilocaine (EMLA) cream Apply 1 application topically as needed. 30 g  3  . losartan (COZAAR) 50 MG tablet Take 100 mg by mouth daily.    . Multiple Vitamins-Minerals (CENTRUM SILVER) tablet Take 1 tablet by mouth daily.     . Omega-3 Fatty Acids (FISH OIL) 1000 MG CAPS Take 1,000 mg by mouth daily.     Marland Kitchen omeprazole (PRILOSEC) 20 MG capsule Take 20 mg by mouth daily.     . ONE TOUCH ULTRA TEST test strip     . pioglitazone (ACTOS) 30 MG tablet Take 30 mg by mouth daily.     Marland Kitchen albuterol (PROVENTIL HFA;VENTOLIN HFA) 108 (90 Base) MCG/ACT inhaler Inhale 2 puffs into the lungs every 6 (six)  hours as needed for wheezing or shortness of breath. (Patient not taking: Reported on 06/20/2020)     No current facility-administered medications for this visit.    Review of Systems  Constitutional: Positive for malaise/fatigue (energy level fluctuates). Negative for chills, diaphoresis, fever and weight loss (up 1 lb).       Feels "wonderful."  HENT: Negative.  Negative for congestion, ear discharge, ear pain, hearing loss, nosebleeds, sinus pain, sore throat and tinnitus.   Eyes: Negative.  Negative for blurred vision and double vision.  Respiratory: Positive for shortness of breath (on exertion, stable). Negative for cough, hemoptysis and sputum production.   Cardiovascular: Negative for chest pain, palpitations and leg swelling.  Gastrointestinal: Negative.  Negative for abdominal pain, blood in stool, constipation, diarrhea, heartburn, melena, nausea and vomiting.       Eating well.  Genitourinary: Negative.  Negative for dysuria, frequency, hematuria and urgency.  Musculoskeletal: Negative.  Negative for back pain, myalgias and neck pain.  Skin: Negative.  Negative for itching and rash.  Neurological: Positive for weakness (right side). Negative for dizziness, tingling, sensory change, speech change, focal weakness and headaches.  Endo/Heme/Allergies: Does not bruise/bleed easily.       Diabetes, under good control.  Psychiatric/Behavioral: Negative.  Negative for  depression and memory loss. The patient is not nervous/anxious and does not have insomnia.   All other systems reviewed and are negative.  Performance status (ECOG):  1  Vitals Blood pressure (!) 149/55, pulse 73, temperature (!) 96.5 F (35.8 C), temperature source Tympanic, resp. rate 20, weight 259 lb 6.4 oz (117.7 kg), SpO2 100 %.   Physical Exam Vitals and nursing note reviewed.  Constitutional:      General: She is not in acute distress.    Appearance: She is well-developed. She is not diaphoretic.     Comments: She has a rolling walker by her side.  She needs assistance onto exam room table.  HENT:     Head: Normocephalic and atraumatic.     Mouth/Throat:     Mouth: Mucous membranes are moist.     Pharynx: Oropharynx is clear. No oropharyngeal exudate.  Eyes:     General: No scleral icterus.    Extraocular Movements: Extraocular movements intact.     Conjunctiva/sclera: Conjunctivae normal.     Pupils: Pupils are equal, round, and reactive to light.     Comments: Glasses.  Brown eyes.  Neck:     Vascular: No JVD.  Cardiovascular:     Rate and Rhythm: Normal rate and regular rhythm.     Heart sounds: Normal heart sounds. No murmur heard. No gallop.   Pulmonary:     Effort: Pulmonary effort is normal. No respiratory distress.     Breath sounds: Normal breath sounds. No wheezing or rales.  Chest:     Chest wall: No tenderness.  Breasts:     Right: No axillary adenopathy or supraclavicular adenopathy.     Left: No axillary adenopathy or supraclavicular adenopathy.    Abdominal:     General: Bowel sounds are normal. There is no distension.     Palpations: Abdomen is soft. There is no mass.     Tenderness: There is no abdominal tenderness. There is no guarding or rebound.  Musculoskeletal:        General: No tenderness. Normal range of motion.     Cervical back: Normal range of motion and neck supple.     Right lower leg: Edema (chronic) present.  Left lower leg:  Edema (chronic) present.  Lymphadenopathy:     Head:     Right side of head: No preauricular, posterior auricular or occipital adenopathy.     Left side of head: No preauricular, posterior auricular or occipital adenopathy.     Cervical: No cervical adenopathy.     Upper Body:     Right upper body: No supraclavicular or axillary adenopathy.     Left upper body: No supraclavicular or axillary adenopathy.     Lower Body: No right inguinal adenopathy. No left inguinal adenopathy.  Skin:    General: Skin is warm and dry.     Coloration: Skin is not pale.     Findings: No erythema or rash.  Neurological:     Mental Status: She is alert and oriented to person, place, and time.     Deep Tendon Reflexes: Reflexes are normal and symmetric.  Psychiatric:        Behavior: Behavior normal.        Thought Content: Thought content normal.        Judgment: Judgment normal.    Appointment on 06/20/2020  Component Date Value Ref Range Status  . Sodium 06/20/2020 138  135 - 145 mmol/L Final  . Potassium 06/20/2020 5.0  3.5 - 5.1 mmol/L Final  . Chloride 06/20/2020 109  98 - 111 mmol/L Final  . CO2 06/20/2020 22  22 - 32 mmol/L Final  . Glucose, Bld 06/20/2020 123* 70 - 99 mg/dL Final   Glucose reference range applies only to samples taken after fasting for at least 8 hours.  . BUN 06/20/2020 69* 8 - 23 mg/dL Final  . Creatinine, Ser 06/20/2020 2.18* 0.44 - 1.00 mg/dL Final  . Calcium 06/20/2020 9.7  8.9 - 10.3 mg/dL Final  . GFR, Estimated 06/20/2020 22* >60 mL/min Final   Comment: (NOTE) Calculated using the CKD-EPI Creatinine Equation (2021)   . Anion gap 06/20/2020 7  5 - 15 Final   Performed at [facility], [address] [place],  [address]  . WBC 06/20/2020 4.0  4.0 - 10.5 K/uL Final  . RBC 06/20/2020 2.86* 3.87 - 5.11 MIL/uL Final  . Hemoglobin 06/20/2020 9.1* 12.0 - 15.0 g/dL Final  . HCT 06/20/2020 29.3* 36.0 - 46.0 % Final  . MCV 06/20/2020 102.4* 80.0 -  100.0 fL Final  . MCH 06/20/2020 31.8  26.0 - 34.0 pg Final  . MCHC 06/20/2020 31.1  30.0 - 36.0 g/dL Final  . RDW 06/20/2020 14.9  11.5 - 15.5 % Final  . Platelets 06/20/2020 161  150 - 400 K/uL Final  . nRBC 06/20/2020 0.0  0.0 - 0.2 % Final  . Neutrophils Relative % 06/20/2020 57  % Final  . Neutro Abs 06/20/2020 2.3  1.7 - 7.7 K/uL Final  . Lymphocytes Relative 06/20/2020 24  % Final  . Lymphs Abs 06/20/2020 0.9  0.7 - 4.0 K/uL Final  . Monocytes Relative 06/20/2020 12  % Final  . Monocytes Absolute 06/20/2020 0.5  0.1 - 1.0 K/uL Final  . Eosinophils Relative 06/20/2020 5  % Final  . Eosinophils Absolute 06/20/2020 0.2  0.0 - 0.5 K/uL Final  . Basophils Relative 06/20/2020 2  % Final  . Basophils Absolute 06/20/2020 0.1  0.0 - 0.1 K/uL Final  . Immature Granulocytes 06/20/2020 0  % Final  . Abs Immature Granulocytes 06/20/2020 0.01  0.00 - 0.07 K/uL Final   Performed at [facility], [address]  [place], [place] [address]    Assessment:  SAANVIKA VAZQUES is a 80 y.o. female withanemia of chronic kidney disease.She has stage III chronic kidney disease. SPEP and free light chain ratio was normal on 11/12/2015.  She receivedAranesp 200 mcg on 09/02/2016, 12/22/2016, 03/23/2017, 07/20/2017, 08/17/2017, and 09/15/2017. She received Aranesp 300 mcgon 10/06/2017, 10/19/2017, 01/04/2018, 03/08/2018,and03/05/2018.She began Retacrit10,000 units on 07/04/2018 (last 03/12/2020) then 20,000 units (last 06/06/2020).  She received 1 unit of PRBCson 09/22/2017. She received Venoferx 4 (10/22/2015 - 11/05/2015), x4 (01/21/2016 - 02/11/2016), 11/24/2016, 06/22/2017, 09/14/2017, 10/01/2017, 10/12/2017, 10/26/2017, 11/16/2017, and 03/01/2018.  Ferritinhas been followed: 63 on 10/08/2015, 290 on 01/14/2016, 651 on 05/12/2016, 467 on 09/01/2016, 413 on 11/19/2016, 633 on 12/22/2016, 374 on 01/19/2017, 480 on 06/22/2017, 135 on 09/22/2017, 159 on 10/06/2017, 373 on  03/01/2018, 289 on 03/29/2018, 257 on 07/04/2018, 217 on 08/30/2018, 178 on 11/08/2018, 191 on 01/31/2019, 145 on 04/25/2019, 153 on 07/18/2019, 132 on 10/10/2019, 122 on 01/02/2020, 89 on 03/28/2020, and 72 on 06/20/2020.   EGDon 09/04/2019revealed an irregularZ-line irregular, at the gastroesophageal junction, normal stomach, and normal duodenum.Colonoscopyon 11/10/2017 revealed diverticulosis in the sigmoid colonand non-bleeding internal hemorrhoids.  She has macrocytic RBC indices. B12, folate, and TSH were normal on 05/09/2019.  She received the Moderna COVID-19 vaccine on 04/19/2019 and 05/17/2019.   Symptomatically, she feels "wonderful." Her energy level fluctuates; energy improves for a few days after receiving Retacrit. Shortness of breath on exertion is stable. Exam is stable.  Plan: 1.   Labs today: CBC with diff, BMP, ferritin, iron studies, B12, folate, TSH. 2.Anemia of chronic renal disease Hematocrit29.3. Hemoglobin9.1.  MCV 102.4. Ferritin72 with an iron saturation of 19% and a TIBC of 382.   Maintain ferritin >100. Discuss hemoglobin range 9.3-9.8 since last visit.  Continue Retacrit 20,000 units every other week.   Retacrit 20,000 units today. 3.Macrocytosis MCV 102.4. She has had macrocytic RBC indices since07/26/2019. She has no known liver disease.  She may have an underlying myelodysplastic syndrome. B12 was595,folate24,andTSH 6.388 (high) today.  She will need a follow-up free T4 given rising TSH.   Follow-up with PCP.  Continue to monitor. 4.Renal insufficiency Creatinine2.18. She is followed by Dr. Holley Raring (send follow-up labs). 5.   RN:  Send BMP to nephrologist. 6.   Port flush every 6-12 weeks. 7.   Retacrit 20,000 units today. 8.   RTC every 2 weeks x 6 for labs (HCT/Hgb) and +/- Retacrit.  9.    RTC in 12 weeks for MD assessment, labs (CBC with diff, Cr, ferritin, iron studies) and +/- Retacrit.  I discussed the assessment and treatment plan with the patient.  The patient was provided an opportunity to ask questions and all were answered.  The patient agreed with the plan and demonstrated an understanding of the instructions.  The patient was advised to call back if the symptoms worsen or if the condition fails to improve as anticipated.   Lequita Asal, MD, PhD    06/20/2020, 10:39 AM  I, Mirian Mo Tufford, am acting as Education administrator for [employer]. Mike Gip, MD, PhD.  I, Blessyn [place] C. Mike Gip, MD, have reviewed the above documentation for accuracy and completeness, and I agree with the above.

## 2020-06-20 ENCOUNTER — Encounter: Payer: Self-pay | Admitting: Hematology and Oncology

## 2020-06-20 ENCOUNTER — Other Ambulatory Visit: Payer: Self-pay

## 2020-06-20 ENCOUNTER — Inpatient Hospital Stay: Payer: Medicare HMO | Admitting: Hematology and Oncology

## 2020-06-20 ENCOUNTER — Inpatient Hospital Stay: Payer: Medicare HMO

## 2020-06-20 ENCOUNTER — Inpatient Hospital Stay: Payer: Medicare HMO | Attending: Hematology and Oncology

## 2020-06-20 VITALS — BP 149/55 | HR 73 | Temp 96.5°F | Resp 20 | Wt 259.4 lb

## 2020-06-20 DIAGNOSIS — N1832 Chronic kidney disease, stage 3b: Secondary | ICD-10-CM | POA: Insufficient documentation

## 2020-06-20 DIAGNOSIS — Z833 Family history of diabetes mellitus: Secondary | ICD-10-CM | POA: Diagnosis not present

## 2020-06-20 DIAGNOSIS — Z823 Family history of stroke: Secondary | ICD-10-CM | POA: Diagnosis not present

## 2020-06-20 DIAGNOSIS — R0602 Shortness of breath: Secondary | ICD-10-CM | POA: Insufficient documentation

## 2020-06-20 DIAGNOSIS — D631 Anemia in chronic kidney disease: Secondary | ICD-10-CM

## 2020-06-20 DIAGNOSIS — Z79899 Other long term (current) drug therapy: Secondary | ICD-10-CM | POA: Insufficient documentation

## 2020-06-20 DIAGNOSIS — Z8249 Family history of ischemic heart disease and other diseases of the circulatory system: Secondary | ICD-10-CM | POA: Insufficient documentation

## 2020-06-20 DIAGNOSIS — Z841 Family history of disorders of kidney and ureter: Secondary | ICD-10-CM | POA: Insufficient documentation

## 2020-06-20 DIAGNOSIS — D7589 Other specified diseases of blood and blood-forming organs: Secondary | ICD-10-CM

## 2020-06-20 DIAGNOSIS — N183 Chronic kidney disease, stage 3 unspecified: Secondary | ICD-10-CM | POA: Diagnosis not present

## 2020-06-20 DIAGNOSIS — G4733 Obstructive sleep apnea (adult) (pediatric): Secondary | ICD-10-CM | POA: Diagnosis not present

## 2020-06-20 DIAGNOSIS — Z836 Family history of other diseases of the respiratory system: Secondary | ICD-10-CM | POA: Diagnosis not present

## 2020-06-20 DIAGNOSIS — R69 Illness, unspecified: Secondary | ICD-10-CM | POA: Diagnosis not present

## 2020-06-20 DIAGNOSIS — Z8051 Family history of malignant neoplasm of kidney: Secondary | ICD-10-CM | POA: Diagnosis not present

## 2020-06-20 DIAGNOSIS — Z811 Family history of alcohol abuse and dependence: Secondary | ICD-10-CM | POA: Diagnosis not present

## 2020-06-20 LAB — BASIC METABOLIC PANEL
Anion gap: 7 (ref 5–15)
BUN: 69 mg/dL — ABNORMAL HIGH (ref 8–23)
CO2: 22 mmol/L (ref 22–32)
Calcium: 9.7 mg/dL (ref 8.9–10.3)
Chloride: 109 mmol/L (ref 98–111)
Creatinine, Ser: 2.18 mg/dL — ABNORMAL HIGH (ref 0.44–1.00)
GFR, Estimated: 22 mL/min — ABNORMAL LOW (ref >60)
Glucose, Bld: 123 mg/dL — ABNORMAL HIGH (ref 70–99)
Potassium: 5 mmol/L (ref 3.5–5.1)
Sodium: 138 mmol/L (ref 135–145)

## 2020-06-20 LAB — CBC WITH DIFFERENTIAL/PLATELET
Abs Immature Granulocytes: 0.01 10*3/uL (ref 0.00–0.07)
Basophils Absolute: 0.1 10*3/uL (ref 0.0–0.1)
Basophils Relative: 2 %
Eosinophils Absolute: 0.2 10*3/uL (ref 0.0–0.5)
Eosinophils Relative: 5 %
HCT: 29.3 % — ABNORMAL LOW (ref 36.0–46.0)
Hemoglobin: 9.1 g/dL — ABNORMAL LOW (ref 12.0–15.0)
Immature Granulocytes: 0 %
Lymphocytes Relative: 24 %
Lymphs Abs: 0.9 10*3/uL (ref 0.7–4.0)
MCH: 31.8 pg (ref 26.0–34.0)
MCHC: 31.1 g/dL (ref 30.0–36.0)
MCV: 102.4 fL — ABNORMAL HIGH (ref 80.0–100.0)
Monocytes Absolute: 0.5 10*3/uL (ref 0.1–1.0)
Monocytes Relative: 12 %
Neutro Abs: 2.3 10*3/uL (ref 1.7–7.7)
Neutrophils Relative %: 57 %
Platelets: 161 10*3/uL (ref 150–400)
RBC: 2.86 MIL/uL — ABNORMAL LOW (ref 3.87–5.11)
RDW: 14.9 % (ref 11.5–15.5)
WBC: 4 10*3/uL (ref 4.0–10.5)
nRBC: 0 % (ref 0.0–0.2)

## 2020-06-20 LAB — FERRITIN: Ferritin: 72 ng/mL (ref 11–307)

## 2020-06-20 LAB — TSH: TSH: 6.388 u[IU]/mL — ABNORMAL HIGH (ref 0.350–4.500)

## 2020-06-20 LAB — FOLATE: Folate: 24 ng/mL (ref >5.9)

## 2020-06-20 LAB — IRON AND TIBC
Iron: 74 ug/dL (ref 28–170)
Saturation Ratios: 19 % (ref 10.4–31.8)
TIBC: 382 ug/dL (ref 250–450)
UIBC: 308 ug/dL

## 2020-06-20 LAB — VITAMIN B12: Vitamin B-12: 595 pg/mL (ref 180–914)

## 2020-06-20 MED ORDER — EPOETIN ALFA-EPBX 10000 UNIT/ML IJ SOLN
20000.0000 [IU] | Freq: Once | INTRAMUSCULAR | Status: AC
  Administered 2020-06-20: 20000 [IU] via SUBCUTANEOUS

## 2020-06-20 MED ORDER — EPOETIN ALFA-EPBX 40000 UNIT/ML IJ SOLN: 20000.0000 [IU] | Freq: Once | INTRAMUSCULAR | Status: DC

## 2020-06-28 DIAGNOSIS — I700 Atherosclerosis of aorta: Secondary | ICD-10-CM | POA: Diagnosis not present

## 2020-06-28 DIAGNOSIS — E782 Mixed hyperlipidemia: Secondary | ICD-10-CM | POA: Diagnosis not present

## 2020-06-28 DIAGNOSIS — H524 Presbyopia: Secondary | ICD-10-CM | POA: Diagnosis not present

## 2020-06-28 DIAGNOSIS — Z6841 Body Mass Index (BMI) 40.0 and over, adult: Secondary | ICD-10-CM | POA: Diagnosis not present

## 2020-06-28 DIAGNOSIS — J449 Chronic obstructive pulmonary disease, unspecified: Secondary | ICD-10-CM | POA: Diagnosis not present

## 2020-06-28 DIAGNOSIS — I129 Hypertensive chronic kidney disease with stage 1 through stage 4 chronic kidney disease, or unspecified chronic kidney disease: Secondary | ICD-10-CM | POA: Diagnosis not present

## 2020-06-28 DIAGNOSIS — E1122 Type 2 diabetes mellitus with diabetic chronic kidney disease: Secondary | ICD-10-CM | POA: Diagnosis not present

## 2020-06-28 DIAGNOSIS — N184 Chronic kidney disease, stage 4 (severe): Secondary | ICD-10-CM | POA: Diagnosis not present

## 2020-06-28 DIAGNOSIS — Z79899 Other long term (current) drug therapy: Secondary | ICD-10-CM | POA: Diagnosis not present

## 2020-06-28 DIAGNOSIS — K219 Gastro-esophageal reflux disease without esophagitis: Secondary | ICD-10-CM | POA: Diagnosis not present

## 2020-06-28 DIAGNOSIS — E1142 Type 2 diabetes mellitus with diabetic polyneuropathy: Secondary | ICD-10-CM | POA: Diagnosis not present

## 2020-07-01 DIAGNOSIS — N1832 Chronic kidney disease, stage 3b: Secondary | ICD-10-CM | POA: Diagnosis not present

## 2020-07-01 DIAGNOSIS — D631 Anemia in chronic kidney disease: Secondary | ICD-10-CM | POA: Diagnosis not present

## 2020-07-01 DIAGNOSIS — E1122 Type 2 diabetes mellitus with diabetic chronic kidney disease: Secondary | ICD-10-CM | POA: Diagnosis not present

## 2020-07-01 DIAGNOSIS — I10 Essential (primary) hypertension: Secondary | ICD-10-CM | POA: Diagnosis not present

## 2020-07-01 DIAGNOSIS — N2581 Secondary hyperparathyroidism of renal origin: Secondary | ICD-10-CM | POA: Diagnosis not present

## 2020-07-03 ENCOUNTER — Other Ambulatory Visit: Payer: Self-pay

## 2020-07-03 DIAGNOSIS — N1832 Chronic kidney disease, stage 3b: Secondary | ICD-10-CM

## 2020-07-03 DIAGNOSIS — D7589 Other specified diseases of blood and blood-forming organs: Secondary | ICD-10-CM

## 2020-07-03 DIAGNOSIS — D631 Anemia in chronic kidney disease: Secondary | ICD-10-CM

## 2020-07-04 ENCOUNTER — Inpatient Hospital Stay: Payer: Medicare HMO

## 2020-07-04 ENCOUNTER — Other Ambulatory Visit: Payer: Self-pay

## 2020-07-04 VITALS — BP 151/57 | HR 57 | Temp 96.0°F | Resp 20

## 2020-07-04 DIAGNOSIS — Z8249 Family history of ischemic heart disease and other diseases of the circulatory system: Secondary | ICD-10-CM | POA: Diagnosis not present

## 2020-07-04 DIAGNOSIS — Z79899 Other long term (current) drug therapy: Secondary | ICD-10-CM | POA: Diagnosis not present

## 2020-07-04 DIAGNOSIS — Z823 Family history of stroke: Secondary | ICD-10-CM | POA: Diagnosis not present

## 2020-07-04 DIAGNOSIS — D631 Anemia in chronic kidney disease: Secondary | ICD-10-CM

## 2020-07-04 DIAGNOSIS — D7589 Other specified diseases of blood and blood-forming organs: Secondary | ICD-10-CM | POA: Diagnosis not present

## 2020-07-04 DIAGNOSIS — N183 Chronic kidney disease, stage 3 unspecified: Secondary | ICD-10-CM

## 2020-07-04 DIAGNOSIS — Z833 Family history of diabetes mellitus: Secondary | ICD-10-CM | POA: Diagnosis not present

## 2020-07-04 DIAGNOSIS — N1832 Chronic kidney disease, stage 3b: Secondary | ICD-10-CM

## 2020-07-04 DIAGNOSIS — R0602 Shortness of breath: Secondary | ICD-10-CM | POA: Diagnosis not present

## 2020-07-04 DIAGNOSIS — Z836 Family history of other diseases of the respiratory system: Secondary | ICD-10-CM | POA: Diagnosis not present

## 2020-07-04 DIAGNOSIS — R69 Illness, unspecified: Secondary | ICD-10-CM | POA: Diagnosis not present

## 2020-07-04 LAB — HEMOGLOBIN AND HEMATOCRIT, BLOOD
HCT: 30 % — ABNORMAL LOW (ref 36.0–46.0)
Hemoglobin: 9.2 g/dL — ABNORMAL LOW (ref 12.0–15.0)

## 2020-07-04 MED ORDER — EPOETIN ALFA-EPBX 10000 UNIT/ML IJ SOLN
20000.0000 [IU] | Freq: Once | INTRAMUSCULAR | Status: AC
  Administered 2020-07-04: 20000 [IU] via SUBCUTANEOUS

## 2020-07-04 MED ORDER — HEPARIN SOD (PORK) LOCK FLUSH 100 UNIT/ML IV SOLN
500.0000 [IU] | Freq: Once | INTRAVENOUS | Status: AC
  Administered 2020-07-04: 500 [IU] via INTRAVENOUS
  Filled 2020-07-04: qty 5

## 2020-07-04 MED ORDER — SODIUM CHLORIDE 0.9% FLUSH
10.0000 mL | Freq: Once | INTRAVENOUS | Status: AC
Start: 2020-07-04 — End: 2020-07-04
  Administered 2020-07-04: 10 mL via INTRAVENOUS
  Filled 2020-07-04: qty 10

## 2020-07-16 DIAGNOSIS — G4733 Obstructive sleep apnea (adult) (pediatric): Secondary | ICD-10-CM | POA: Diagnosis not present

## 2020-07-18 ENCOUNTER — Inpatient Hospital Stay: Payer: Medicare HMO | Attending: Hematology and Oncology

## 2020-07-18 ENCOUNTER — Inpatient Hospital Stay: Payer: Medicare HMO

## 2020-07-18 ENCOUNTER — Other Ambulatory Visit: Payer: Self-pay

## 2020-07-18 VITALS — BP 140/69

## 2020-07-18 DIAGNOSIS — D631 Anemia in chronic kidney disease: Secondary | ICD-10-CM | POA: Insufficient documentation

## 2020-07-18 DIAGNOSIS — N1832 Chronic kidney disease, stage 3b: Secondary | ICD-10-CM | POA: Insufficient documentation

## 2020-07-18 DIAGNOSIS — R531 Weakness: Secondary | ICD-10-CM | POA: Insufficient documentation

## 2020-07-18 DIAGNOSIS — Z8249 Family history of ischemic heart disease and other diseases of the circulatory system: Secondary | ICD-10-CM | POA: Insufficient documentation

## 2020-07-18 DIAGNOSIS — Z841 Family history of disorders of kidney and ureter: Secondary | ICD-10-CM | POA: Insufficient documentation

## 2020-07-18 DIAGNOSIS — Z833 Family history of diabetes mellitus: Secondary | ICD-10-CM | POA: Diagnosis not present

## 2020-07-18 DIAGNOSIS — R5383 Other fatigue: Secondary | ICD-10-CM | POA: Insufficient documentation

## 2020-07-18 DIAGNOSIS — N183 Chronic kidney disease, stage 3 unspecified: Secondary | ICD-10-CM

## 2020-07-18 DIAGNOSIS — Z823 Family history of stroke: Secondary | ICD-10-CM | POA: Insufficient documentation

## 2020-07-18 DIAGNOSIS — D7589 Other specified diseases of blood and blood-forming organs: Secondary | ICD-10-CM | POA: Diagnosis not present

## 2020-07-18 DIAGNOSIS — R0602 Shortness of breath: Secondary | ICD-10-CM | POA: Insufficient documentation

## 2020-07-18 DIAGNOSIS — Z811 Family history of alcohol abuse and dependence: Secondary | ICD-10-CM | POA: Insufficient documentation

## 2020-07-18 DIAGNOSIS — Z836 Family history of other diseases of the respiratory system: Secondary | ICD-10-CM | POA: Insufficient documentation

## 2020-07-18 DIAGNOSIS — Z8051 Family history of malignant neoplasm of kidney: Secondary | ICD-10-CM | POA: Diagnosis not present

## 2020-07-18 DIAGNOSIS — Z79899 Other long term (current) drug therapy: Secondary | ICD-10-CM | POA: Insufficient documentation

## 2020-07-18 LAB — HEMOGLOBIN AND HEMATOCRIT, BLOOD
HCT: 30.8 % — ABNORMAL LOW (ref 36.0–46.0)
Hemoglobin: 9.7 g/dL — ABNORMAL LOW (ref 12.0–15.0)

## 2020-07-18 MED ORDER — EPOETIN ALFA-EPBX 10000 UNIT/ML IJ SOLN
20000.0000 [IU] | Freq: Once | INTRAMUSCULAR | Status: AC
  Administered 2020-07-18: 20000 [IU] via SUBCUTANEOUS

## 2020-07-20 DIAGNOSIS — G4733 Obstructive sleep apnea (adult) (pediatric): Secondary | ICD-10-CM | POA: Diagnosis not present

## 2020-07-23 DIAGNOSIS — J449 Chronic obstructive pulmonary disease, unspecified: Secondary | ICD-10-CM | POA: Diagnosis not present

## 2020-07-23 DIAGNOSIS — R0600 Dyspnea, unspecified: Secondary | ICD-10-CM | POA: Diagnosis not present

## 2020-07-23 DIAGNOSIS — J9811 Atelectasis: Secondary | ICD-10-CM | POA: Diagnosis not present

## 2020-07-23 DIAGNOSIS — R918 Other nonspecific abnormal finding of lung field: Secondary | ICD-10-CM | POA: Diagnosis not present

## 2020-07-23 DIAGNOSIS — J811 Chronic pulmonary edema: Secondary | ICD-10-CM | POA: Diagnosis not present

## 2020-07-23 DIAGNOSIS — R911 Solitary pulmonary nodule: Secondary | ICD-10-CM | POA: Diagnosis not present

## 2020-07-23 DIAGNOSIS — G4733 Obstructive sleep apnea (adult) (pediatric): Secondary | ICD-10-CM | POA: Diagnosis not present

## 2020-07-23 DIAGNOSIS — Z9989 Dependence on other enabling machines and devices: Secondary | ICD-10-CM | POA: Diagnosis not present

## 2020-08-01 ENCOUNTER — Telehealth: Payer: Self-pay

## 2020-08-01 ENCOUNTER — Other Ambulatory Visit: Payer: Self-pay

## 2020-08-01 ENCOUNTER — Inpatient Hospital Stay: Payer: Medicare HMO | Admitting: Oncology

## 2020-08-01 ENCOUNTER — Inpatient Hospital Stay: Payer: Medicare HMO

## 2020-08-01 VITALS — BP 154/65 | HR 74 | Temp 96.1°F | Resp 20

## 2020-08-01 DIAGNOSIS — N1832 Chronic kidney disease, stage 3b: Secondary | ICD-10-CM

## 2020-08-01 DIAGNOSIS — R531 Weakness: Secondary | ICD-10-CM | POA: Diagnosis not present

## 2020-08-01 DIAGNOSIS — Z833 Family history of diabetes mellitus: Secondary | ICD-10-CM | POA: Diagnosis not present

## 2020-08-01 DIAGNOSIS — Z836 Family history of other diseases of the respiratory system: Secondary | ICD-10-CM | POA: Diagnosis not present

## 2020-08-01 DIAGNOSIS — D631 Anemia in chronic kidney disease: Secondary | ICD-10-CM

## 2020-08-01 DIAGNOSIS — Z8249 Family history of ischemic heart disease and other diseases of the circulatory system: Secondary | ICD-10-CM | POA: Diagnosis not present

## 2020-08-01 DIAGNOSIS — N183 Chronic kidney disease, stage 3 unspecified: Secondary | ICD-10-CM

## 2020-08-01 DIAGNOSIS — Z79899 Other long term (current) drug therapy: Secondary | ICD-10-CM | POA: Diagnosis not present

## 2020-08-01 DIAGNOSIS — R0602 Shortness of breath: Secondary | ICD-10-CM | POA: Diagnosis not present

## 2020-08-01 DIAGNOSIS — D7589 Other specified diseases of blood and blood-forming organs: Secondary | ICD-10-CM

## 2020-08-01 DIAGNOSIS — R5383 Other fatigue: Secondary | ICD-10-CM | POA: Diagnosis not present

## 2020-08-01 LAB — HEMOGLOBIN AND HEMATOCRIT, BLOOD
HCT: 28.9 % — ABNORMAL LOW (ref 36.0–46.0)
Hemoglobin: 8.9 g/dL — ABNORMAL LOW (ref 12.0–15.0)

## 2020-08-01 MED ORDER — EPOETIN ALFA-EPBX 10000 UNIT/ML IJ SOLN
20000.0000 [IU] | Freq: Once | INTRAMUSCULAR | Status: AC
  Administered 2020-08-01: 20000 [IU] via SUBCUTANEOUS

## 2020-08-01 NOTE — Telephone Encounter (Signed)
Per Sonia Baller pt receives Retacrit every two weeks.Hgb seems to keep dropping so she will like pt to f/u with NP/MD to adjust Retacrit doseage. Spoke to pt and made her aware of results along with future appt with Dr. Tasia Catchings on 6/2 at 9:45am. Pt agreed to appt and is aware of results.

## 2020-08-01 NOTE — Progress Notes (Signed)
She got retacrit today and has been getting it every 2 weeks. It dropped some today. Does she have follow-up with an MD/NP in the near future. She may need an adjustment of her retacrit b/c her hemoglobin is dropping.   Faythe Casa, NP 08/01/2020 9:06 AM

## 2020-08-01 NOTE — Progress Notes (Signed)
TY so much Faythe Casa, NP 08/01/2020 10:33 AM

## 2020-08-02 ENCOUNTER — Encounter: Payer: Self-pay | Admitting: Internal Medicine

## 2020-08-08 ENCOUNTER — Encounter: Payer: Self-pay | Admitting: Oncology

## 2020-08-08 ENCOUNTER — Inpatient Hospital Stay: Payer: Medicare HMO

## 2020-08-08 ENCOUNTER — Other Ambulatory Visit: Payer: Self-pay

## 2020-08-08 ENCOUNTER — Inpatient Hospital Stay: Payer: Medicare HMO | Attending: Hematology and Oncology | Admitting: Oncology

## 2020-08-08 VITALS — BP 144/57 | HR 68 | Temp 96.7°F | Resp 20 | Wt 261.2 lb

## 2020-08-08 DIAGNOSIS — R5382 Chronic fatigue, unspecified: Secondary | ICD-10-CM | POA: Diagnosis not present

## 2020-08-08 DIAGNOSIS — D7589 Other specified diseases of blood and blood-forming organs: Secondary | ICD-10-CM | POA: Diagnosis not present

## 2020-08-08 DIAGNOSIS — Z8249 Family history of ischemic heart disease and other diseases of the circulatory system: Secondary | ICD-10-CM | POA: Diagnosis not present

## 2020-08-08 DIAGNOSIS — Z841 Family history of disorders of kidney and ureter: Secondary | ICD-10-CM | POA: Insufficient documentation

## 2020-08-08 DIAGNOSIS — Z833 Family history of diabetes mellitus: Secondary | ICD-10-CM | POA: Insufficient documentation

## 2020-08-08 DIAGNOSIS — D631 Anemia in chronic kidney disease: Secondary | ICD-10-CM | POA: Diagnosis not present

## 2020-08-08 DIAGNOSIS — Z836 Family history of other diseases of the respiratory system: Secondary | ICD-10-CM | POA: Diagnosis not present

## 2020-08-08 DIAGNOSIS — M2550 Pain in unspecified joint: Secondary | ICD-10-CM | POA: Insufficient documentation

## 2020-08-08 DIAGNOSIS — R0602 Shortness of breath: Secondary | ICD-10-CM | POA: Insufficient documentation

## 2020-08-08 DIAGNOSIS — N1832 Chronic kidney disease, stage 3b: Secondary | ICD-10-CM | POA: Insufficient documentation

## 2020-08-08 DIAGNOSIS — Z8051 Family history of malignant neoplasm of kidney: Secondary | ICD-10-CM | POA: Diagnosis not present

## 2020-08-08 DIAGNOSIS — R5383 Other fatigue: Secondary | ICD-10-CM | POA: Diagnosis not present

## 2020-08-08 DIAGNOSIS — Z79899 Other long term (current) drug therapy: Secondary | ICD-10-CM | POA: Diagnosis not present

## 2020-08-08 DIAGNOSIS — Z823 Family history of stroke: Secondary | ICD-10-CM | POA: Insufficient documentation

## 2020-08-08 DIAGNOSIS — Z95828 Presence of other vascular implants and grafts: Secondary | ICD-10-CM

## 2020-08-08 DIAGNOSIS — Z811 Family history of alcohol abuse and dependence: Secondary | ICD-10-CM | POA: Diagnosis not present

## 2020-08-08 LAB — CBC WITH DIFFERENTIAL/PLATELET
Abs Immature Granulocytes: 0.02 10*3/uL (ref 0.00–0.07)
Basophils Absolute: 0.1 10*3/uL (ref 0.0–0.1)
Basophils Relative: 2 %
Eosinophils Absolute: 0.2 10*3/uL (ref 0.0–0.5)
Eosinophils Relative: 5 %
HCT: 29.3 % — ABNORMAL LOW (ref 36.0–46.0)
Hemoglobin: 9.1 g/dL — ABNORMAL LOW (ref 12.0–15.0)
Immature Granulocytes: 1 %
Lymphocytes Relative: 24 %
Lymphs Abs: 0.9 10*3/uL (ref 0.7–4.0)
MCH: 32 pg (ref 26.0–34.0)
MCHC: 31.1 g/dL (ref 30.0–36.0)
MCV: 103.2 fL — ABNORMAL HIGH (ref 80.0–100.0)
Monocytes Absolute: 0.5 10*3/uL (ref 0.1–1.0)
Monocytes Relative: 13 %
Neutro Abs: 2.3 10*3/uL (ref 1.7–7.7)
Neutrophils Relative %: 55 %
Platelets: 187 10*3/uL (ref 150–400)
RBC: 2.84 MIL/uL — ABNORMAL LOW (ref 3.87–5.11)
RDW: 15.5 % (ref 11.5–15.5)
WBC: 4 10*3/uL (ref 4.0–10.5)
nRBC: 0 % (ref 0.0–0.2)

## 2020-08-08 LAB — IRON AND TIBC
Iron: 55 ug/dL (ref 28–170)
Saturation Ratios: 13 % (ref 10.4–31.8)
TIBC: 416 ug/dL (ref 250–450)
UIBC: 361 ug/dL

## 2020-08-08 LAB — T4, FREE: Free T4: 0.75 ng/dL (ref 0.61–1.12)

## 2020-08-08 LAB — TSH: TSH: 4.719 u[IU]/mL — ABNORMAL HIGH (ref 0.350–4.500)

## 2020-08-08 LAB — RETIC PANEL
Immature Retic Fract: 31.2 % — ABNORMAL HIGH (ref 2.3–15.9)
RBC.: 2.88 MIL/uL — ABNORMAL LOW (ref 3.87–5.11)
Retic Count, Absolute: 74.9 10*3/uL (ref 19.0–186.0)
Retic Ct Pct: 2.6 % (ref 0.4–3.1)
Reticulocyte Hemoglobin: 31.5 pg (ref >27.9)

## 2020-08-08 LAB — FERRITIN: Ferritin: 43 ng/mL (ref 11–307)

## 2020-08-08 NOTE — Progress Notes (Signed)
[facility]  [address], [address] [place], [place] [address] Phone: [phone]  Fax: [phone]   Clinic Day:  08/08/2020  Referring physician: Anthonette Legato, MD  Chief Complaint: Sara Bright is a 80 y.o. female resents for follow-up of anemia of chronic kidney disease   INTERVAL HISTORY STEPHAN DRAUGHN is a 80 y.o. female who has above history reviewed by me today presents for follow up visit for anemia and CKD Patient previously followed up by Dr.Corcoran, patient switched care to me on 08/08/20 Extensive medical record review was performed by me  anemia of chronic kidney disease.She has stage III chronic kidney disease. SPEP and free light chain ratio was normal on 11/12/2015.  She receivedAranesp 200 mcg on 09/02/2016, 12/22/2016, 03/23/2017, 07/20/2017, 08/17/2017, and 09/15/2017. She received Aranesp 300 mcgon 10/06/2017, 10/19/2017, 01/04/2018, 03/08/2018,and03/05/2018.She began Retacrit10,000 units on 07/04/2018 (last 03/12/2020) then 20,000 units - present.  She received 1 unit of PRBCson 09/22/2017. She received Venoferx 4 (10/22/2015 - 11/05/2015), x4 (01/21/2016 - 02/11/2016), 11/24/2016, 06/22/2017, 09/14/2017, 10/01/2017, 10/12/2017, 10/26/2017, 11/16/2017, and 03/01/2018.  EGDon 09/04/2019revealed an irregularZ-line irregular, at the gastroesophageal junction, normal stomach, and normal duodenum.Colonoscopyon 11/10/2017 revealed diverticulosis in the sigmoid colonand non-bleeding internal hemorrhoids.  Today patient reports chronic fatigue knees.  Unchanged.  Chronic shortness with exertion  Review of Systems  Constitutional: Positive for fatigue. Negative for appetite change, chills and fever.  HENT:   Negative for hearing loss and voice change.   Eyes: Negative for eye problems.  Respiratory: Positive for shortness of breath. Negative for chest tightness and cough.   Cardiovascular: Negative for chest  pain.  Gastrointestinal: Negative for abdominal distention, abdominal pain and blood in stool.  Endocrine: Negative for hot flashes.  Genitourinary: Negative for difficulty urinating and frequency.   Musculoskeletal: Positive for arthralgias.  Skin: Negative for itching and rash.  Neurological: Negative for extremity weakness.  Hematological: Negative for adenopathy.  Psychiatric/Behavioral: Negative for confusion.    Past Medical History:  Diagnosis Date  . Adenoma of colon   . Adenomatous colon polyp   . Anemia    IDA  . Cataract   . Cervical stenosis of spinal canal   . Chronic kidney disease    stage 3  . Diabetes mellitus without complication ([place])   . GERD (gastroesophageal reflux disease)   . Glaucoma   . Heme positive stool   . History of UTI   . Hx of gallstones   . Hyperkalemia   . Hyperlipidemia   . Hyperlipidemia   . Hypertension   . Lumbar stenosis   . Lymphedema   . Neuromuscular disorder ([place])   . Neuropathy associated with endocrine disorder ([place])   . Obesity   . OSA on CPAP    on CPAP  . Osteoarthritis   . Osteoarthritis   . Port-A-Cath in place   . Retinopathy due to secondary diabetes ([place])   . Secondary hyperparathyroidism of renal origin [facility])     Past Surgical History:  Procedure Laterality Date  . ACDF X2    . BACK SURGERY    . Cataract extraction right    . catarect extraction left    . CHOLECYSTECTOMY    . COLONOSCOPY WITH PROPOFOL N/A 12/02/2016   Procedure: COLONOSCOPY WITH PROPOFOL;  Surgeon: Manya Silvas, MD;  Location: [facility] ENDOSCOPY;  Service: Endoscopy;  Laterality: N/A;  . COLONOSCOPY WITH PROPOFOL N/A 11/10/2017   Procedure: COLONOSCOPY WITH PROPOFOL;  Surgeon: Toledo, Benay Pike, MD;  Location: [facility] ENDOSCOPY;  Service: Gastroenterology;  Laterality: N/A;  . colonoscopy with removal lesions by snare    . Endoscoic carpal tunnel release    . ESOPHAGOGASTRODUODENOSCOPY N/A 11/10/2017   Procedure: ESOPHAGOGASTRODUODENOSCOPY  (EGD);  Surgeon: Toledo, Benay Pike, MD;  Location: [facility] ENDOSCOPY;  Service: Gastroenterology;  Laterality: N/A;  . ESOPHAGOGASTRODUODENOSCOPY (EGD) WITH PROPOFOL N/A 12/02/2016   Procedure: ESOPHAGOGASTRODUODENOSCOPY (EGD) WITH PROPOFOL;  Surgeon: Manya Silvas, MD;  Location[facility] ENDOSCOPY;  Service: Endoscopy;  Laterality: N/A;  . IR IMAGING GUIDED PORT INSERTION  10/01/2017  . Laminectomy posterior lumbar facetectomy and formaninotomy w/Decomp    . Laminectony posterior cervicle decomp w/Facectomy and foraminotomy    . VEIN LIGATION AND STRIPPING Left     Family History  Problem Relation Age of Onset  . Coronary artery disease Mother   . Diabetes type II Mother   . Hypertension Mother   . Tuberculosis Father   . Stroke Father   . Diabetes type II Sister   . Migraines Sister   . Alcohol abuse Brother   . Coronary artery disease Brother   . Diabetes type II Brother   . Kidney cancer Brother   . Kidney disease Brother   . Heart attack Brother   . Hypertension Brother     Social History:  reports that she has never smoked. She has never used smokeless tobacco. She reports that she does not drink alcohol and does not use drugs. She lives in [place]. The patient is alone today.  Allergies:  Allergies  Allergen Reactions  . Aspirin     Other reaction(s): Other (see comments)  . Hydrocodone-Acetaminophen Other (See Comments)    Sedation and GI upset, pt is not allergic to acetaminophen  . Other Other (See Comments)  . Statins Other (See Comments)  . Tramadol-Acetaminophen Nausea And Vomiting    GI upset, pt is not allergic to acetaminophen    Current Medications: Current Outpatient Medications  Medication Sig Dispense Refill  . acetaminophen (TYLENOL) 500 MG tablet Take 500 mg by mouth every 6 (six) hours as needed.     Marland Kitchen amLODipine (NORVASC) 10 MG tablet Take 10 mg by mouth daily.    Marland Kitchen aspirin EC 81 MG tablet Take 81 mg by mouth daily.    . ASSURE COMFORT LANCETS 30G  MISC     . calcitRIOL (ROCALTROL) 0.25 MCG capsule Take 0.5 mcg by mouth 3 (three) times a week.    . carvedilol (COREG) 6.25 MG tablet Take 6.25 mg by mouth 2 (two) times daily.  12  . furosemide (LASIX) 40 MG tablet Take 40 mg by mouth daily.     Marland Kitchen gabapentin (NEURONTIN) 100 MG capsule Take 100 mg by mouth 2 (two) times daily.     Marland Kitchen gemfibrozil (LOPID) 600 MG tablet Take 600 mg by mouth daily.     . hydrALAZINE (APRESOLINE) 50 MG tablet Take 25 mg by mouth 3 (three) times daily.   12  . Lancet Devices (ADJUSTABLE LANCING DEVICE) MISC     . latanoprost (XALATAN) 0.005 % ophthalmic solution Place 1 drop into both eyes.     Marland Kitchen lidocaine-prilocaine (EMLA) cream Apply 1 application topically as needed. 30 g 3  . losartan (COZAAR) 50 MG tablet Take 100 mg by mouth daily.    . Multiple Vitamins-Minerals (CENTRUM SILVER) tablet Take 1 tablet by mouth daily.     . Omega-3 Fatty Acids (FISH OIL) 1000 MG CAPS Take 1,000 mg by mouth daily.     Marland Kitchen omeprazole (PRILOSEC)  20 MG capsule Take 20 mg by mouth daily.     . ONE TOUCH ULTRA TEST test strip     . pioglitazone (ACTOS) 30 MG tablet Take 30 mg by mouth daily.     Marland Kitchen albuterol (PROVENTIL HFA;VENTOLIN HFA) 108 (90 Base) MCG/ACT inhaler Inhale 2 puffs into the lungs every 6 (six) hours as needed for wheezing or shortness of breath. (Patient not taking: No sig reported)    . ferrous sulfate 325 (65 FE) MG tablet Take 325 mg by mouth daily. (Patient not taking: Reported on 08/08/2020)    . glimepiride (AMARYL) 1 MG tablet Take 0.5 mg by mouth daily with breakfast.  (Patient not taking: Reported on 08/08/2020)     No current facility-administered medications for this visit.     Performance status (ECOG):  1  Vitals Blood pressure (!) 144/57, pulse 68, temperature (!) 96.7 F (35.9 C), resp. rate 20, weight 261 lb 3.9 oz (118.5 kg), SpO2 100 %.   Physical Exam Vitals and nursing note reviewed.  Constitutional:      General: She is not in acute distress.     Appearance: She is well-developed. She is not diaphoretic.     Comments: She has a rolling walker by her side.  She needs assistance onto exam room table.  HENT:     Head: Normocephalic and atraumatic.     Mouth/Throat:     Mouth: Mucous membranes are moist.     Pharynx: Oropharynx is clear. No oropharyngeal exudate.  Eyes:     General: No scleral icterus.    Extraocular Movements: Extraocular movements intact.     Conjunctiva/sclera: Conjunctivae normal.     Pupils: Pupils are equal, round, and reactive to light.     Comments: Glasses.  Brown eyes.  Neck:     Vascular: No JVD.  Cardiovascular:     Rate and Rhythm: Normal rate and regular rhythm.     Heart sounds: Normal heart sounds. No murmur heard. No gallop.   Pulmonary:     Effort: Pulmonary effort is normal. No respiratory distress.     Breath sounds: Normal breath sounds. No wheezing or rales.  Chest:     Chest wall: No tenderness.  Breasts:     Right: No axillary adenopathy or supraclavicular adenopathy.     Left: No axillary adenopathy or supraclavicular adenopathy.    Abdominal:     General: Bowel sounds are normal. There is no distension.     Palpations: Abdomen is soft. There is no mass.     Tenderness: There is no abdominal tenderness. There is no guarding or rebound.  Musculoskeletal:        General: No tenderness. Normal range of motion.     Cervical back: Normal range of motion and neck supple.     Right lower leg: Edema (chronic) present.     Left lower leg: Edema (chronic) present.  Lymphadenopathy:     Head:     Right side of head: No preauricular, posterior auricular or occipital adenopathy.     Left side of head: No preauricular, posterior auricular or occipital adenopathy.     Cervical: No cervical adenopathy.     Upper Body:     Right upper body: No supraclavicular or axillary adenopathy.     Left upper body: No supraclavicular or axillary adenopathy.     Lower Body: No right inguinal adenopathy.  No left inguinal adenopathy.  Skin:    General: Skin is warm and dry.  Coloration: Skin is not pale.     Findings: No erythema or rash.  Neurological:     Mental Status: She is alert and oriented to person, place, and time.     Deep Tendon Reflexes: Reflexes are normal and symmetric.  Psychiatric:        Mood and Affect: Mood normal.    Office Visit on 08/08/2020  Component Date Value Ref Range Status  . TSH 08/08/2020 4.719* 0.350 - 4.500 uIU/mL Final   Comment: Performed by a 3rd Generation assay with a functional sensitivity of <=0.01 uIU/mL. Performed at [facility], [address] [place], [place] [address]   . Free T4 08/08/2020 0.75  0.61 - 1.12 ng/dL Final   Comment: (NOTE) Biotin ingestion may interfere with free T4 tests. If the results are inconsistent with the TSH level, previous test results, or the clinical presentation, then consider biotin interference. If needed, order repeat testing after stopping biotin. Performed at [facility], [address] [place], [place] [address]   . Retic Ct Pct 08/08/2020 2.6  0.4 - 3.1 % Final  . RBC. 08/08/2020 2.88* 3.87 - 5.11 MIL/uL Final  . Retic Count, Absolute 08/08/2020 74.9  19.0 - 186.0 K/uL Final  . Immature Retic Fract 08/08/2020 31.2* 2.3 - 15.9 % Final  . Reticulocyte Hemoglobin 08/08/2020 31.5  >27.9 pg Final   Performed at [facility], [address] [place], [place] [address]  . Iron 08/08/2020 55  28 - 170 ug/dL Final  . TIBC 08/08/2020 416  250 - 450 ug/dL Final  . Saturation Ratios 08/08/2020 13  10.4 - 31.8 % Final  . UIBC 08/08/2020 361  ug/dL Final   Performed at [facility], [address], [place], [place] [address]  . Ferritin 08/08/2020 43  11 - 307 ng/mL Final   Performed at [facility], [address] [place], [place] [address]  . WBC 08/08/2020 4.0  4.0 - 10.5 K/uL Final  . RBC 08/08/2020 2.84* 3.87 - 5.11 MIL/uL Final  . Hemoglobin  08/08/2020 9.1* 12.0 - 15.0 g/dL Final  . HCT 08/08/2020 29.3* 36.0 - 46.0 % Final  . MCV 08/08/2020 103.2* 80.0 - 100.0 fL Final  . MCH 08/08/2020 32.0  26.0 - 34.0 pg Final  . MCHC 08/08/2020 31.1  30.0 - 36.0 g/dL Final  . RDW 08/08/2020 15.5  11.5 - 15.5 % Final  . Platelets 08/08/2020 187  150 - 400 K/uL Final  . nRBC 08/08/2020 0.0  0.0 - 0.2 % Final  . Neutrophils Relative % 08/08/2020 55  % Final  . Neutro Abs 08/08/2020 2.3  1.7 - 7.7 K/uL Final  . Lymphocytes Relative 08/08/2020 24  % Final  . Lymphs Abs 08/08/2020 0.9  0.7 - 4.0 K/uL Final  . Monocytes Relative 08/08/2020 13  % Final  . Monocytes Absolute 08/08/2020 0.5  0.1 - 1.0 K/uL Final  . Eosinophils Relative 08/08/2020 5  % Final  . Eosinophils Absolute 08/08/2020 0.2  0.0 - 0.5 K/uL Final  . Basophils Relative 08/08/2020 2  % Final  . Basophils Absolute 08/08/2020 0.1  0.0 - 0.1 K/uL Final  . Immature Granulocytes 08/08/2020 1  % Final  . Abs Immature Granulocytes 08/08/2020 0.02  0.00 - 0.07 K/uL Final   Performed at [facility], [address] [place], [place] [address]    Assessment and plan 1. Anemia due to stage 3b chronic kidney disease ([place])   2. Macrocytosis  3. Port-A-Cath in place     # Anemia of chronic renal disease Recent labs were reviewed and discussed with patient We recommend to check CBC, iron TIBC ferritin.  Multiple myeloma panel, light chain ratio, TSH and free T4, reticulocyte panel. Hemoglobin is 9.1 Iron panel showed ferritin of 43, borderline iron saturation 13, increased TIBC 416. I recommend patient to proceed with 1 dose of IV Venofer 200 mg, patient agrees with the plan.  She has tolerated IV Venofer treatment in the past except  fatigue for day or 2 afterwards.  #Chronic macrocytosis, She has had macrocytic RBC indices since07/26/2019. 06/20/2020, B12 595, folate 24  #Chronic kidney disease avoid nephrotoxin.  Encourage hydration.  Follow-up with  nephrology. #Port-A-Cath in place due to no IV access.  Recommend port flush every 8 weeks if port is not used.  RTC, 2 week H&H +/- Retacrit RTC  4 weeks H&H/MD/+/- Retacrit  I discussed the assessment and treatment plan with the patient.  The patient was provided an opportunity to ask questions and all were answered.  The patient agreed with the plan and demonstrated an understanding of the instructions.  The patient was advised to call back if the symptoms worsen or if the condition fails to improve as anticipated.  Earlie Server, MD, PhD Hematology Oncology [place] at [facility] 08/08/2020

## 2020-08-09 ENCOUNTER — Inpatient Hospital Stay: Payer: Medicare HMO

## 2020-08-09 VITALS — BP 149/62 | HR 63 | Temp 97.4°F | Resp 20

## 2020-08-09 DIAGNOSIS — R5383 Other fatigue: Secondary | ICD-10-CM | POA: Diagnosis not present

## 2020-08-09 DIAGNOSIS — Z8249 Family history of ischemic heart disease and other diseases of the circulatory system: Secondary | ICD-10-CM | POA: Diagnosis not present

## 2020-08-09 DIAGNOSIS — D631 Anemia in chronic kidney disease: Secondary | ICD-10-CM

## 2020-08-09 DIAGNOSIS — R5382 Chronic fatigue, unspecified: Secondary | ICD-10-CM | POA: Diagnosis not present

## 2020-08-09 DIAGNOSIS — D7589 Other specified diseases of blood and blood-forming organs: Secondary | ICD-10-CM | POA: Diagnosis not present

## 2020-08-09 DIAGNOSIS — Z833 Family history of diabetes mellitus: Secondary | ICD-10-CM | POA: Diagnosis not present

## 2020-08-09 DIAGNOSIS — N183 Anemia in chronic kidney disease: Secondary | ICD-10-CM

## 2020-08-09 DIAGNOSIS — M2550 Pain in unspecified joint: Secondary | ICD-10-CM | POA: Diagnosis not present

## 2020-08-09 DIAGNOSIS — R0602 Shortness of breath: Secondary | ICD-10-CM | POA: Diagnosis not present

## 2020-08-09 DIAGNOSIS — Z79899 Other long term (current) drug therapy: Secondary | ICD-10-CM | POA: Diagnosis not present

## 2020-08-09 DIAGNOSIS — N1832 Chronic kidney disease, stage 3b: Secondary | ICD-10-CM | POA: Diagnosis not present

## 2020-08-09 LAB — KAPPA/LAMBDA LIGHT CHAINS
Kappa free light chain: 42.7 mg/L — ABNORMAL HIGH (ref 3.3–19.4)
Kappa, lambda light chain ratio: 1.56 (ref 0.26–1.65)
Lambda free light chains: 27.4 mg/L — ABNORMAL HIGH (ref 5.7–26.3)

## 2020-08-09 MED ORDER — HEPARIN SOD (PORK) LOCK FLUSH 100 UNIT/ML IV SOLN
500.0000 [IU] | Freq: Once | INTRAVENOUS | Status: AC
Start: 2020-08-09 — End: 2020-08-09
  Administered 2020-08-09: 500 [IU] via INTRAVENOUS
  Filled 2020-08-09: qty 5

## 2020-08-09 MED ORDER — SODIUM CHLORIDE 0.9 % IV SOLN
Freq: Once | INTRAVENOUS | Status: AC
Start: 2020-08-09 — End: 2020-08-09
  Filled 2020-08-09: qty 250

## 2020-08-09 MED ORDER — SODIUM CHLORIDE 0.9 % IV SOLN: 200.0000 mg | Freq: Once | INTRAVENOUS | Status: DC

## 2020-08-09 MED ORDER — IRON SUCROSE 20 MG/ML IV SOLN
200.0000 mg | Freq: Once | INTRAVENOUS | Status: AC
  Administered 2020-08-09: 200 mg via INTRAVENOUS
  Filled 2020-08-09: qty 10

## 2020-08-11 LAB — MULTIPLE MYELOMA PANEL, SERUM
Albumin SerPl Elph-Mcnc: 3.7 g/dL (ref 2.9–4.4)
Albumin/Glob SerPl: 1.5 (ref 0.7–1.7)
Alpha 1: 0.3 g/dL (ref 0.0–0.4)
Alpha2 Glob SerPl Elph-Mcnc: 0.7 g/dL (ref 0.4–1.0)
B-Globulin SerPl Elph-Mcnc: 0.9 g/dL (ref 0.7–1.3)
Gamma Glob SerPl Elph-Mcnc: 0.6 g/dL (ref 0.4–1.8)
Globulin, Total: 2.6 g/dL (ref 2.2–3.9)
IgA: 134 mg/dL (ref 64–422)
IgG (Immunoglobin G), Serum: 717 mg/dL (ref 586–1602)
IgM (Immunoglobulin M), Srm: 65 mg/dL (ref 26–217)
Total Protein ELP: 6.3 g/dL (ref 6.0–8.5)

## 2020-08-19 ENCOUNTER — Inpatient Hospital Stay: Payer: Medicare HMO

## 2020-08-20 ENCOUNTER — Other Ambulatory Visit: Payer: Self-pay

## 2020-08-20 ENCOUNTER — Inpatient Hospital Stay: Payer: Medicare HMO

## 2020-08-20 VITALS — BP 148/69 | HR 65 | Temp 97.0°F | Resp 18

## 2020-08-20 DIAGNOSIS — R5383 Other fatigue: Secondary | ICD-10-CM | POA: Diagnosis not present

## 2020-08-20 DIAGNOSIS — Z79899 Other long term (current) drug therapy: Secondary | ICD-10-CM | POA: Diagnosis not present

## 2020-08-20 DIAGNOSIS — D631 Anemia in chronic kidney disease: Secondary | ICD-10-CM | POA: Diagnosis not present

## 2020-08-20 DIAGNOSIS — N183 Chronic kidney disease, stage 3 unspecified: Secondary | ICD-10-CM

## 2020-08-20 DIAGNOSIS — G4733 Obstructive sleep apnea (adult) (pediatric): Secondary | ICD-10-CM | POA: Diagnosis not present

## 2020-08-20 DIAGNOSIS — N1832 Chronic kidney disease, stage 3b: Secondary | ICD-10-CM | POA: Diagnosis not present

## 2020-08-20 DIAGNOSIS — R5382 Chronic fatigue, unspecified: Secondary | ICD-10-CM | POA: Diagnosis not present

## 2020-08-20 DIAGNOSIS — R0602 Shortness of breath: Secondary | ICD-10-CM | POA: Diagnosis not present

## 2020-08-20 DIAGNOSIS — Z833 Family history of diabetes mellitus: Secondary | ICD-10-CM | POA: Diagnosis not present

## 2020-08-20 DIAGNOSIS — Z8249 Family history of ischemic heart disease and other diseases of the circulatory system: Secondary | ICD-10-CM | POA: Diagnosis not present

## 2020-08-20 DIAGNOSIS — D7589 Other specified diseases of blood and blood-forming organs: Secondary | ICD-10-CM | POA: Diagnosis not present

## 2020-08-20 DIAGNOSIS — M2550 Pain in unspecified joint: Secondary | ICD-10-CM | POA: Diagnosis not present

## 2020-08-20 LAB — HEMOGLOBIN AND HEMATOCRIT, BLOOD
HCT: 27.3 % — ABNORMAL LOW (ref 36.0–46.0)
Hemoglobin: 8.7 g/dL — ABNORMAL LOW (ref 12.0–15.0)

## 2020-08-20 MED ORDER — EPOETIN ALFA-EPBX 10000 UNIT/ML IJ SOLN
20000.0000 [IU] | Freq: Once | INTRAMUSCULAR | Status: AC
  Administered 2020-08-20: 20000 [IU] via SUBCUTANEOUS

## 2020-08-22 ENCOUNTER — Ambulatory Visit: Payer: Medicare HMO

## 2020-08-22 ENCOUNTER — Other Ambulatory Visit: Payer: Medicare HMO

## 2020-09-01 ENCOUNTER — Other Ambulatory Visit: Payer: Self-pay

## 2020-09-02 ENCOUNTER — Inpatient Hospital Stay: Payer: Medicare HMO

## 2020-09-05 ENCOUNTER — Encounter: Payer: Self-pay | Admitting: Oncology

## 2020-09-05 ENCOUNTER — Inpatient Hospital Stay: Payer: Medicare HMO

## 2020-09-05 ENCOUNTER — Other Ambulatory Visit: Payer: Self-pay

## 2020-09-05 ENCOUNTER — Inpatient Hospital Stay (HOSPITAL_BASED_OUTPATIENT_CLINIC_OR_DEPARTMENT_OTHER): Payer: Medicare HMO | Admitting: Oncology

## 2020-09-05 VITALS — BP 136/49 | HR 73 | Temp 96.7°F | Resp 18 | Wt 264.3 lb

## 2020-09-05 DIAGNOSIS — R5382 Chronic fatigue, unspecified: Secondary | ICD-10-CM | POA: Diagnosis not present

## 2020-09-05 DIAGNOSIS — Z8249 Family history of ischemic heart disease and other diseases of the circulatory system: Secondary | ICD-10-CM | POA: Diagnosis not present

## 2020-09-05 DIAGNOSIS — M2550 Pain in unspecified joint: Secondary | ICD-10-CM | POA: Diagnosis not present

## 2020-09-05 DIAGNOSIS — D631 Anemia in chronic kidney disease: Secondary | ICD-10-CM

## 2020-09-05 DIAGNOSIS — Z95828 Presence of other vascular implants and grafts: Secondary | ICD-10-CM | POA: Diagnosis not present

## 2020-09-05 DIAGNOSIS — D7589 Other specified diseases of blood and blood-forming organs: Secondary | ICD-10-CM | POA: Diagnosis not present

## 2020-09-05 DIAGNOSIS — N1832 Chronic kidney disease, stage 3b: Secondary | ICD-10-CM

## 2020-09-05 DIAGNOSIS — Z79899 Other long term (current) drug therapy: Secondary | ICD-10-CM | POA: Diagnosis not present

## 2020-09-05 DIAGNOSIS — N184 Chronic kidney disease, stage 4 (severe): Secondary | ICD-10-CM

## 2020-09-05 DIAGNOSIS — R0602 Shortness of breath: Secondary | ICD-10-CM | POA: Diagnosis not present

## 2020-09-05 DIAGNOSIS — R5383 Other fatigue: Secondary | ICD-10-CM | POA: Diagnosis not present

## 2020-09-05 DIAGNOSIS — N183 Chronic kidney disease, stage 3 unspecified: Secondary | ICD-10-CM

## 2020-09-05 DIAGNOSIS — Z833 Family history of diabetes mellitus: Secondary | ICD-10-CM | POA: Diagnosis not present

## 2020-09-05 LAB — RETIC PANEL
Immature Retic Fract: 19.4 % — ABNORMAL HIGH (ref 2.3–15.9)
RBC.: 2.64 MIL/uL — ABNORMAL LOW (ref 3.87–5.11)
Retic Count, Absolute: 39.1 10*3/uL (ref 19.0–186.0)
Retic Ct Pct: 1.5 % (ref 0.4–3.1)
Reticulocyte Hemoglobin: 33.7 pg (ref >27.9)

## 2020-09-05 LAB — HEMOGLOBIN AND HEMATOCRIT, BLOOD
HCT: 26.2 % — ABNORMAL LOW (ref 36.0–46.0)
Hemoglobin: 8.4 g/dL — ABNORMAL LOW (ref 12.0–15.0)

## 2020-09-05 MED ORDER — EPOETIN ALFA-EPBX 10000 UNIT/ML IJ SOLN
30000.0000 [IU] | Freq: Once | INTRAMUSCULAR | Status: AC
  Administered 2020-09-05: 30000 [IU] via SUBCUTANEOUS

## 2020-09-05 NOTE — Progress Notes (Signed)
rt knee pain has been going on for a month; for the past two weeks seems like it's been worse , mainly painful when she is walking around.

## 2020-09-05 NOTE — Progress Notes (Signed)
[facility]  [address], [address] [place], [place] [address] Phone: [phone]  Fax: [phone]   Clinic Day:  09/05/2020  Referring physician: Anthonette Legato, MD  Chief Complaint: Sara Bright is a 80 y.o. female resents for follow-up of anemia of chronic kidney disease   INTERVAL HISTORY Sara Bright is a 80 y.o. female who has above history reviewed by me today presents for follow up visit for anemia and CKD Patient previously followed up by Dr.Corcoran, patient switched care to me on 08/08/20 Extensive medical record review was performed by me  # Anemia of chronic kidney disease. She has stage III chronic kidney disease.  SPEP and free light chain ratio was normal on 11/12/2015.     She received Aranesp 200 mcg on 09/02/2016, 12/22/2016, 03/23/2017, 07/20/2017, 08/17/2017, and 09/15/2017.  She received Aranesp 300 mcg on 10/06/2017, 10/19/2017, 01/04/2018, 03/08/2018, and 05/10/2018.  She began Retacrit 10,000 units on 07/04/2018 (last 03/12/2020) then 20,000 units - present.   She received 1 unit of PRBCs on 09/22/2017.  She received Venofer x 4 (10/22/2015 - 11/05/2015), x4 (01/21/2016 - 02/11/2016), 11/24/2016, 06/22/2017, 09/14/2017, 10/01/2017, 10/12/2017, 10/26/2017, 11/16/2017, and 03/01/2018.    EGD on 11/10/2017 revealed an irregular Z-line irregular, at the gastroesophageal junction, normal stomach, and normal duodenum.  Colonoscopy on 11/10/2017 revealed diverticulosis in the sigmoid colon and non-bleeding internal hemorrhoids.    INTERVAL HISTORY Sara Bright is a 80 y.o. female who has above history reviewed by me today presents for follow up visit for management of anemia Problems and complaints are listed below: 08/09/2020, received Venofer 200 mg x 1. Continues to feel tired and fatigued.  Chronic knee pain, unchanged.  Chronic shortness of breath. She noticed the chronic lower extremity edema is slightly worse.   Review of  Systems  Constitutional:  Positive for fatigue. Negative for appetite change, chills and fever.  HENT:   Negative for hearing loss and voice change.   Eyes:  Negative for eye problems.  Respiratory:  Positive for shortness of breath. Negative for chest tightness and cough.   Cardiovascular:  Negative for chest pain.  Gastrointestinal:  Negative for abdominal distention, abdominal pain and blood in stool.  Endocrine: Negative for hot flashes.  Genitourinary:  Negative for difficulty urinating and frequency.   Musculoskeletal:  Positive for arthralgias.  Skin:  Negative for itching and rash.  Neurological:  Negative for extremity weakness.  Hematological:  Negative for adenopathy.  Psychiatric/Behavioral:  Negative for confusion.    Past Medical History:  Diagnosis Date   Adenoma of colon    Adenomatous colon polyp    Anemia    IDA   Cataract    Cervical stenosis of spinal canal    Chronic kidney disease    stage 3   Diabetes mellitus without complication (HCC)    GERD (gastroesophageal reflux disease)    Glaucoma    Heme positive stool    History of UTI    Hx of gallstones    Hyperkalemia    Hyperlipidemia    Hyperlipidemia    Hypertension    Lumbar stenosis    Lymphedema    Neuromuscular disorder (HCC)    Neuropathy associated with endocrine disorder (HCC)    Obesity    OSA on CPAP    on CPAP   Osteoarthritis    Osteoarthritis    Port-A-Cath in place    Retinopathy due to secondary diabetes ([place])    Secondary hyperparathyroidism of renal origin ([place])  Past Surgical History:  Procedure Laterality Date   ACDF X2     BACK SURGERY     Cataract extraction right     catarect extraction left     CHOLECYSTECTOMY     COLONOSCOPY WITH PROPOFOL N/A 12/02/2016   Procedure: COLONOSCOPY WITH PROPOFOL;  Surgeon: Manya Silvas, MD;  Location: [facility] ENDOSCOPY;  Service: Endoscopy;  Laterality: N/A;   COLONOSCOPY WITH PROPOFOL N/A 11/10/2017   Procedure: COLONOSCOPY WITH  PROPOFOL;  Surgeon: Toledo, Benay Pike, MD;  Location: [facility] ENDOSCOPY;  Service: Gastroenterology;  Laterality: N/A;   colonoscopy with removal lesions by snare     Endoscoic carpal tunnel release     ESOPHAGOGASTRODUODENOSCOPY N/A 11/10/2017   Procedure: ESOPHAGOGASTRODUODENOSCOPY (EGD);  Surgeon: Toledo, Benay Pike, MD;  Location: [facility] ENDOSCOPY;  Service: Gastroenterology;  Laterality: N/A;   ESOPHAGOGASTRODUODENOSCOPY (EGD) WITH PROPOFOL N/A 12/02/2016   Procedure: ESOPHAGOGASTRODUODENOSCOPY (EGD) WITH PROPOFOL;  Surgeon: Manya Silvas, MD;  Location: [facility] ENDOSCOPY;  Service: Endoscopy;  Laterality: N/A;   IR IMAGING GUIDED PORT INSERTION  10/01/2017   Laminectomy posterior lumbar facetectomy and formaninotomy w/Decomp     Laminectony posterior cervicle decomp w/Facectomy and foraminotomy     VEIN LIGATION AND STRIPPING Left     Family History  Problem Relation Age of Onset   Coronary artery disease Mother    Diabetes type II Mother    Hypertension Mother    Tuberculosis Father    Stroke Father    Diabetes type II Sister    Migraines Sister    Alcohol abuse Brother    Coronary artery disease Brother    Diabetes type II Brother    Kidney cancer Brother    Kidney disease Brother    Heart attack Brother    Hypertension Brother     Social History:  reports that she has never smoked. She has never used smokeless tobacco. She reports that she does not drink alcohol and does not use drugs. She lives in [place]. The patient is alone today.  Allergies:  Allergies  Allergen Reactions   Aspirin     Other reaction(s): Other (see comments)   Hydrocodone-Acetaminophen Other (See Comments)    Sedation and GI upset, pt is not allergic to acetaminophen   Other Other (See Comments)   Statins Other (See Comments)   Tramadol-Acetaminophen Nausea And Vomiting    GI upset, pt is not allergic to acetaminophen    Current Medications: Current Outpatient Medications  Medication Sig Dispense  Refill   acetaminophen (TYLENOL) 500 MG tablet Take 500 mg by mouth every 6 (six) hours as needed.      amLODipine (NORVASC) 10 MG tablet Take 10 mg by mouth daily.     aspirin EC 81 MG tablet Take 81 mg by mouth daily.     ASSURE COMFORT LANCETS 30G MISC      calcitRIOL (ROCALTROL) 0.25 MCG capsule Take 0.5 mcg by mouth 3 (three) times a week.     carvedilol (COREG) 6.25 MG tablet Take 6.25 mg by mouth 2 (two) times daily.  12   ferrous sulfate 325 (65 FE) MG tablet Take 325 mg by mouth daily.     furosemide (LASIX) 40 MG tablet Take 40 mg by mouth daily.      gabapentin (NEURONTIN) 100 MG capsule Take 100 mg by mouth 2 (two) times daily.      gemfibrozil (LOPID) 600 MG tablet Take 600 mg by mouth daily.      glimepiride (AMARYL) 1 MG tablet  Take 0.5 mg by mouth daily with breakfast.     hydrALAZINE (APRESOLINE) 50 MG tablet Take 25 mg by mouth 3 (three) times daily.   12   Lancet Devices (ADJUSTABLE LANCING DEVICE) MISC      latanoprost (XALATAN) 0.005 % ophthalmic solution Place 1 drop into both eyes.      lidocaine-prilocaine (EMLA) cream Apply 1 application topically as needed. 30 g 3   losartan (COZAAR) 100 MG tablet Take 100 mg by mouth daily.     losartan (COZAAR) 50 MG tablet Take 100 mg by mouth daily.     Multiple Vitamins-Minerals (CENTRUM SILVER) tablet Take 1 tablet by mouth daily.      Omega-3 Fatty Acids (FISH OIL) 1000 MG CAPS Take 1,000 mg by mouth daily.      omeprazole (PRILOSEC) 20 MG capsule Take 20 mg by mouth daily.      ONE TOUCH ULTRA TEST test strip      pioglitazone (ACTOS) 30 MG tablet Take 30 mg by mouth daily.      albuterol (PROVENTIL HFA;VENTOLIN HFA) 108 (90 Base) MCG/ACT inhaler Inhale 2 puffs into the lungs every 6 (six) hours as needed for wheezing or shortness of breath. (Patient not taking: No sig reported)     No current facility-administered medications for this visit.     Performance status (ECOG):  1  Vitals Blood pressure (!) 136/49, pulse  73, temperature (!) 96.7 F (35.9 C), resp. rate 18, weight 264 lb 5.3 oz (119.9 kg), SpO2 100 %.   Physical Exam Vitals and nursing note reviewed.  Constitutional:      General: She is not in acute distress.    Appearance: She is well-developed. She is not diaphoretic.     Comments: Patient walks with a rolling walker.  HENT:     Head: Normocephalic and atraumatic.     Mouth/Throat:     Mouth: Mucous membranes are moist.     Pharynx: Oropharynx is clear. No oropharyngeal exudate.  Eyes:     General: No scleral icterus.    Extraocular Movements: Extraocular movements intact.     Conjunctiva/sclera: Conjunctivae normal.     Pupils: Pupils are equal, round, and reactive to light.     Comments: Glasses.  Brown eyes.  Neck:     Vascular: No JVD.  Cardiovascular:     Rate and Rhythm: Normal rate and regular rhythm.     Heart sounds: Normal heart sounds. No murmur heard.   No gallop.  Pulmonary:     Effort: Pulmonary effort is normal. No respiratory distress.     Breath sounds: Normal breath sounds. No wheezing or rales.  Chest:     Chest wall: No tenderness.  Breasts:    Right: No axillary adenopathy or supraclavicular adenopathy.     Left: No axillary adenopathy or supraclavicular adenopathy.  Abdominal:     General: Bowel sounds are normal. There is no distension.     Palpations: Abdomen is soft. There is no mass.     Tenderness: There is no abdominal tenderness. There is no guarding or rebound.  Musculoskeletal:        General: No tenderness. Normal range of motion.     Cervical back: Normal range of motion and neck supple.     Right lower leg: Edema (chronic) present.     Left lower leg: Edema (chronic) present.  Lymphadenopathy:     Head:     Right side of head: No preauricular, posterior auricular or  occipital adenopathy.     Left side of head: No preauricular, posterior auricular or occipital adenopathy.     Cervical: No cervical adenopathy.     Upper Body:      Right upper body: No supraclavicular or axillary adenopathy.     Left upper body: No supraclavicular or axillary adenopathy.     Lower Body: No right inguinal adenopathy. No left inguinal adenopathy.  Skin:    General: Skin is warm and dry.     Coloration: Skin is not pale.     Findings: No erythema or rash.  Neurological:     Mental Status: She is alert and oriented to person, place, and time.     Deep Tendon Reflexes: Reflexes are normal and symmetric.  Psychiatric:        Mood and Affect: Mood normal.   Appointment on 09/05/2020  Component Date Value Ref Range Status   Hemoglobin 09/05/2020 8.4 (A) 12.0 - 15.0 g/dL Final   HCT 09/05/2020 26.2 (A) 36.0 - 46.0 % Final   Performed at [facility], [address] [place], [place] [address]    Assessment and plan 1. CKD (chronic kidney disease) stage 4, GFR 15-29 ml/min (HCC)   2. Anemia in stage 4 chronic kidney disease ([place])   3. Port-A-Cath in place     #  Anemia of chronic renal disease Labs are reviewed and discussed with patient. Hemoglobin is 8.4 Proceed with Retacrit, increase to 30,000 units.  Recommend additional IV venofer 243m weekly x3.  H&H Q 2 weeks +/- Retacrit.  Previous GI work up did not reveal GI bleeding source.   #Chronic macrocytosis, She has had macrocytic RBC indices since 10/01/2017. 06/20/2020, B12 595, folate 24 ? Underlying MDS? If hemoglobin continues to drop despite good iron store and retacrit. Will do bone marrow biopsy         #Chronic kidney disease avoid nephrotoxin.  Encourage hydration.  Follow-up with nephrology. #Port-A-Cath in place due to no IV access.  Recommend port flush every 8 weeks if port is not used.  RTC, 2 week, 4 week  H&H +/- Retacrit RTC  6 weeks cbc iron tibc ferritin /MD/+/- Retacrit  I discussed the assessment and treatment plan with the patient.  The patient was provided an opportunity to ask questions and all were answered.  The patient agreed with  the plan and demonstrated an understanding of the instructions.  The patient was advised to call back if the symptoms worsen or if the condition fails to improve as anticipated.  ZEarlie Server MD, PhD Hematology Oncology [facility] [facility]6/30/2022

## 2020-09-06 ENCOUNTER — Inpatient Hospital Stay: Payer: Medicare HMO | Attending: Internal Medicine

## 2020-09-06 VITALS — BP 121/63 | HR 66 | Temp 97.2°F | Resp 18

## 2020-09-06 DIAGNOSIS — D631 Anemia in chronic kidney disease: Secondary | ICD-10-CM

## 2020-09-06 DIAGNOSIS — N183 Chronic kidney disease, stage 3 unspecified: Secondary | ICD-10-CM

## 2020-09-06 DIAGNOSIS — N184 Chronic kidney disease, stage 4 (severe): Secondary | ICD-10-CM | POA: Diagnosis not present

## 2020-09-06 MED ORDER — SODIUM CHLORIDE 0.9 % IV SOLN
Freq: Once | INTRAVENOUS | Status: AC
  Filled 2020-09-06: qty 250

## 2020-09-06 MED ORDER — HEPARIN SOD (PORK) LOCK FLUSH 100 UNIT/ML IV SOLN
500.0000 [IU] | Freq: Once | INTRAVENOUS | Status: AC
  Administered 2020-09-06: 500 [IU] via INTRAVENOUS
  Filled 2020-09-06: qty 5

## 2020-09-06 MED ORDER — SODIUM CHLORIDE 0.9 % IV SOLN: 200.0000 mg | Freq: Once | INTRAVENOUS | Status: DC

## 2020-09-06 MED ORDER — IRON SUCROSE 20 MG/ML IV SOLN
200.0000 mg | Freq: Once | INTRAVENOUS | Status: AC
Start: 2020-09-06 — End: 2020-09-06
  Administered 2020-09-06: 200 mg via INTRAVENOUS
  Filled 2020-09-06: qty 10

## 2020-09-06 NOTE — Patient Instructions (Signed)
C[facility]ER [facility]ENTER [place] [facility]  Discharge Instructions: Thank you for choosing [facility] to provide your oncology and hematology care.  If you have a lab appointment with the Cancer Center, please go directly to the Cancer Center and check in at the registration area.  Wear comfortable clothing and clothing appropriate for easy access to any Portacath or PICC line.   We strive to give you quality time with your provider. You may need to reschedule your appointment if you arrive late (15 or more minutes).  Arriving late affects you and other patients whose appointments are after yours.  Also, if you miss three or more appointments without notifying the office, you may be dismissed from the clinic at the provider's discretion.      For prescription refill requests, have your pharmacy contact our office and allow 72 hours for refills to be completed.    Today you received the following chemotherapy and/or immunotherapy agents       To help prevent nausea and vomiting after your treatment, we encourage you to take your nausea medication as directed.  BELOW ARE SYMPTOMS THAT SHOULD BE REPORTED IMMEDIATELY: *FEVER GREATER THAN 100.4 F (38 C) OR HIGHER *CHILLS OR SWEATING *NAUSEA AND VOMITING THAT IS NOT CONTROLLED WITH YOUR NAUSEA MEDICATION *UNUSUAL SHORTNESS OF BREATH *UNUSUAL BRUISING OR BLEEDING *URINARY PROBLEMS (pain or burning when urinating, or frequent urination) *BOWEL PROBLEMS (unusual diarrhea, constipation, pain near the anus) TENDERNESS IN MOUTH AND THROAT WITH OR WITHOUT PRESENCE OF ULCERS (sore throat, sores in mouth, or a toothache) UNUSUAL RASH, SWELLING OR PAIN  UNUSUAL VAGINAL DISCHARGE OR ITCHING   Items with * indicate a potential emergency and should be followed up as soon as possible or go to the Emergency Department if any problems should occur.  Please show the CHEMOTHERAPY ALERT CARD or IMMUNOTHERAPY ALERT CARD at check-in to the Emergency  Department and triage nurse.  Should you have questions after your visit or need to cancel or reschedule your appointment, please contact CANCER CENTER [place] [facility]  [phone] and follow the prompts.  Office hours are 8:00 a.m. to 4:30 p.m. Monday - Friday. Please note that voicemails left after 4:00 p.m. may not be returned until the following business day.  We are closed weekends and major holidays. You have access to a nurse at all times for urgent questions. Please call the main number to the clinic [phone] and follow the prompts.  For any non-urgent questions, you may also contact your provider using MyChart. We now offer e-Visits for anyone 18 and older to request care online for non-urgent symptoms. For details visit [email or web address].   Also download the MyChart app! Go to the app store, search "MyChart", open the app, select [place], and log in with your MyChart username and password.  Due to Covid, a mask is required upon entering the hospital/clinic. If you do not have a mask, one will be given to you upon arrival. For doctor visits, patients may have 1 support person aged 18 or older with them. For treatment visits, patients cannot have anyone with them due to current Covid guidelines and our immunocompromised population.  

## 2020-09-12 ENCOUNTER — Other Ambulatory Visit: Payer: Self-pay

## 2020-09-12 ENCOUNTER — Inpatient Hospital Stay: Payer: Medicare HMO

## 2020-09-12 VITALS — BP 127/68 | HR 64 | Temp 97.8°F | Resp 18

## 2020-09-12 DIAGNOSIS — D631 Anemia in chronic kidney disease: Secondary | ICD-10-CM

## 2020-09-12 DIAGNOSIS — N184 Chronic kidney disease, stage 4 (severe): Secondary | ICD-10-CM | POA: Diagnosis not present

## 2020-09-12 DIAGNOSIS — N183 Chronic kidney disease, stage 3 unspecified: Secondary | ICD-10-CM

## 2020-09-12 MED ORDER — SODIUM CHLORIDE 0.9 % IV SOLN: 200.0000 mg | Freq: Once | INTRAVENOUS | Status: DC

## 2020-09-12 MED ORDER — HEPARIN SOD (PORK) LOCK FLUSH 100 UNIT/ML IV SOLN
500.0000 [IU] | Freq: Once | INTRAVENOUS | Status: AC
  Administered 2020-09-12: 500 [IU] via INTRAVENOUS
  Filled 2020-09-12: qty 5

## 2020-09-12 MED ORDER — SODIUM CHLORIDE 0.9 % IV SOLN
Freq: Once | INTRAVENOUS | Status: AC
  Filled 2020-09-12: qty 250

## 2020-09-12 MED ORDER — IRON SUCROSE 20 MG/ML IV SOLN
200.0000 mg | Freq: Once | INTRAVENOUS | Status: AC
  Administered 2020-09-12: 200 mg via INTRAVENOUS
  Filled 2020-09-12: qty 10

## 2020-09-16 ENCOUNTER — Ambulatory Visit: Payer: Medicare HMO | Admitting: Hematology and Oncology

## 2020-09-16 ENCOUNTER — Other Ambulatory Visit: Payer: Medicare HMO

## 2020-09-16 ENCOUNTER — Ambulatory Visit: Payer: Medicare HMO

## 2020-09-17 DIAGNOSIS — Z6841 Body Mass Index (BMI) 40.0 and over, adult: Secondary | ICD-10-CM | POA: Diagnosis not present

## 2020-09-17 DIAGNOSIS — M25561 Pain in right knee: Secondary | ICD-10-CM | POA: Diagnosis not present

## 2020-09-17 DIAGNOSIS — N183 Chronic kidney disease, stage 3 unspecified: Secondary | ICD-10-CM | POA: Diagnosis not present

## 2020-09-17 DIAGNOSIS — M1711 Unilateral primary osteoarthritis, right knee: Secondary | ICD-10-CM | POA: Diagnosis not present

## 2020-09-17 DIAGNOSIS — G8929 Other chronic pain: Secondary | ICD-10-CM | POA: Diagnosis not present

## 2020-09-17 DIAGNOSIS — E1322 Other specified diabetes mellitus with diabetic chronic kidney disease: Secondary | ICD-10-CM | POA: Diagnosis not present

## 2020-09-19 ENCOUNTER — Inpatient Hospital Stay: Payer: Medicare HMO

## 2020-09-19 ENCOUNTER — Other Ambulatory Visit: Payer: Self-pay

## 2020-09-19 VITALS — BP 154/69 | HR 67 | Temp 97.0°F | Resp 18

## 2020-09-19 DIAGNOSIS — N183 Chronic kidney disease, stage 3 unspecified: Secondary | ICD-10-CM

## 2020-09-19 DIAGNOSIS — N1832 Chronic kidney disease, stage 3b: Secondary | ICD-10-CM

## 2020-09-19 DIAGNOSIS — N184 Chronic kidney disease, stage 4 (severe): Secondary | ICD-10-CM | POA: Diagnosis not present

## 2020-09-19 DIAGNOSIS — D631 Anemia in chronic kidney disease: Secondary | ICD-10-CM | POA: Diagnosis not present

## 2020-09-19 DIAGNOSIS — G4733 Obstructive sleep apnea (adult) (pediatric): Secondary | ICD-10-CM | POA: Diagnosis not present

## 2020-09-19 DIAGNOSIS — D7589 Other specified diseases of blood and blood-forming organs: Secondary | ICD-10-CM

## 2020-09-19 LAB — HEMOGLOBIN AND HEMATOCRIT, BLOOD
HCT: 27.5 % — ABNORMAL LOW (ref 36.0–46.0)
Hemoglobin: 8.7 g/dL — ABNORMAL LOW (ref 12.0–15.0)

## 2020-09-19 LAB — RETIC PANEL
Immature Retic Fract: 18.6 % — ABNORMAL HIGH (ref 2.3–15.9)
RBC.: 2.69 MIL/uL — ABNORMAL LOW (ref 3.87–5.11)
Retic Count, Absolute: 54.3 10*3/uL (ref 19.0–186.0)
Retic Ct Pct: 2 % (ref 0.4–3.1)
Reticulocyte Hemoglobin: 34.6 pg (ref >27.9)

## 2020-09-19 MED ORDER — EPOETIN ALFA-EPBX 10000 UNIT/ML IJ SOLN
30000.0000 [IU] | Freq: Once | INTRAMUSCULAR | Status: AC
  Administered 2020-09-19: 30000 [IU] via SUBCUTANEOUS

## 2020-09-19 NOTE — Progress Notes (Signed)
Pt received retacrit injection in clinic today. No complaints on d/c.

## 2020-09-19 NOTE — Patient Instructions (Signed)
Sara Bright    Discharge Instructions: Thank you for choosing [facility] to provide your oncology and hematology care.  If you have a lab appointment with the [facility], please go directly to the [place] and check in at the registration area.  Wear comfortable clothing and clothing appropriate for easy access to any Portacath or PICC line.   We strive to give you quality time with your provider. You may need to reschedule your appointment if you arrive late (15 or more minutes).  Arriving late affects you and other patients whose appointments are after yours.  Also, if you miss three or more appointments without notifying the office, you may be dismissed from the clinic at the provider's discretion.      For prescription refill requests, have your pharmacy contact our office and allow 72 hours for refills to be completed.    BELOW ARE SYMPTOMS THAT SHOULD BE REPORTED IMMEDIATELY: *FEVER GREATER THAN 100.4 F (38 C) OR HIGHER *CHILLS OR SWEATING *NAUSEA AND VOMITING THAT IS NOT CONTROLLED WITH YOUR NAUSEA MEDICATION *UNUSUAL SHORTNESS OF BREATH *UNUSUAL BRUISING OR BLEEDING *URINARY PROBLEMS (pain or burning when urinating, or frequent urination) *BOWEL PROBLEMS (unusual diarrhea, constipation, pain near the anus) TENDERNESS IN MOUTH AND THROAT WITH OR WITHOUT PRESENCE OF ULCERS (sore throat, sores in mouth, or a toothache) UNUSUAL RASH, SWELLING OR PAIN  UNUSUAL VAGINAL DISCHARGE OR ITCHING   Items with * indicate a potential emergency and should be followed up as soon as possible or go to the Emergency Department if any problems should occur.  Please show the CHEMOTHERAPY ALERT CARD or IMMUNOTHERAPY ALERT CARD at check-in to the Emergency Department and triage nurse.  Should you have questions after your visit or need to cancel or reschedule your appointment, please contact [place]  [phone] and follow  the prompts.  Office hours are 8:00 a.m. to 4:30 p.m. Monday - Friday. Please note that voicemails left after 4:00 p.m. may not be returned until the following business day.  We are closed weekends and major holidays. You have access to a nurse at all times for urgent questions. Please call the main number to the clinic [phone] and follow the prompts.  For any non-urgent questions, you may also contact your provider using MyChart. We now offer e-Visits for anyone 55 and older to request care online for non-urgent symptoms. For details visit [email or web address].   Also download the MyChart app! Go to the app store, search "MyChart", open the app, select [facility], and log in with your MyChart username and password.  Due to Covid, a mask is required upon entering the hospital/clinic. If you do not have a mask, one will be given to you upon arrival. For doctor visits, patients may have 1 support person aged 36 or older with them. For treatment visits, patients cannot have anyone with them due to current Covid guidelines and our immunocompromised population.   Epoetin Alfa injection What is this medication? EPOETIN ALFA (e POE e tin AL fa) helps your body make more red blood cells. This medicine is used to treat anemia caused by chronic kidney disease, cancer chemotherapy, or HIV-therapy. It may also be used before surgery if you haveanemia. This medicine may be used for other purposes; ask your health care provider orpharmacist if you have questions. COMMON BRAND NAME(S): Epogen, Procrit, Retacrit What should I tell my care team before I take this medication? They need to know if  you have any of these conditions: cancer heart disease high blood pressure history of blood clots history of stroke low levels of folate, iron, or vitamin B12 in the blood seizures an unusual or allergic reaction to erythropoietin, albumin, benzyl alcohol, hamster proteins, other medicines, foods, dyes, or  preservatives pregnant or trying to get pregnant breast-feeding How should I use this medication? This medicine is for injection into a vein or under the skin. It is [place] by a health care professional in a hospital or clinic setting. If you get this medicine at home, you will be taught how to prepare and give this medicine. Use exactly as directed. Take your medicine at regularintervals. Do not take your medicine more often than directed. It is important that you put your used needles and syringes in a special sharps container. Do not put them in a trash can. If you do not have a sharpscontainer, call your pharmacist or healthcare provider to get one. A special MedGuide will be given to you by the pharmacist with eachprescription and refill. Be sure to read this information carefully each time. Talk to your pediatrician regarding the use of this medicine in children. Whilethis drug may be prescribed for selected conditions, precautions do apply. Overdosage: If you think you have taken too much of this medicine contact apoison control center or emergency room at once. NOTE: This medicine is only for you. Do not share this medicine with others. What if I miss a dose? If you miss a dose, take it as soon as you can. If it is almost time for yournext dose, take only that dose. Do not take double or extra doses. What may interact with this medication? Interactions have not been studied. This list may not describe all possible interactions. Give your health care provider a list of all the medicines, herbs, non-prescription drugs, or dietary supplements you use. Also tell them if you smoke, drink alcohol, or use illegaldrugs. Some items may interact with your medicine. What should I watch for while using this medication? Your condition will be monitored carefully while you are receiving thismedicine. You may need blood work done while you are taking this medicine. This medicine may cause a  decrease in vitamin B6. You should make sure that you get enough vitamin B6 while you are taking this medicine. Discuss the foods youeat and the vitamins you take with your health care professional. What side effects may I notice from receiving this medication? Side effects that you should report to your doctor or health care professionalas soon as possible: allergic reactions like skin rash, itching or hives, swelling of the face, lips, or tongue seizures signs and symptoms of a blood clot such as breathing problems; changes in vision; chest pain; severe, sudden headache; pain, swelling, warmth in the leg; trouble speaking; sudden numbness or weakness of the face, arm or leg signs and symptoms of a stroke like changes in vision; confusion; trouble speaking or understanding; severe headaches; sudden numbness or weakness of the face, arm or leg; trouble walking; dizziness; loss of balance or coordination Side effects that usually do not require medical attention (report to yourdoctor or health care professional if they continue or are bothersome): chills cough dizziness fever headaches joint pain muscle cramps muscle pain nausea, vomiting pain, redness, or irritation at site where injected This list may not describe all possible side effects. Call your doctor for medical advice about side effects. You may report side effects to FDA at1-800-FDA-1088. Where should I keep my  medication? Keep out of the reach of children. Store in a refrigerator between 2 and 8 degrees C (36 and 46 degrees F). Do not freeze or shake. Throw away any unused portion if using a single-dose vial. Multi-dose vials can be kept in the refrigerator for up to 21 days after theinitial dose. Throw away unused medicine. NOTE: This sheet is a summary. It may not cover all possible information. If you have questions about this medicine, talk to your doctor, pharmacist, orhealth care provider.  2022 Elsevier/Gold Standard  (2016-10-02 08:35:19)

## 2020-09-30 ENCOUNTER — Other Ambulatory Visit: Payer: Self-pay | Admitting: *Deleted

## 2020-09-30 DIAGNOSIS — Z95828 Presence of other vascular implants and grafts: Secondary | ICD-10-CM

## 2020-09-30 MED ORDER — LIDOCAINE-PRILOCAINE 2.5-2.5 % EX CREA: 1.0000 "application " | TOPICAL_CREAM | CUTANEOUS | 3 refills | Status: AC | PRN

## 2020-10-02 DIAGNOSIS — Z6841 Body Mass Index (BMI) 40.0 and over, adult: Secondary | ICD-10-CM | POA: Diagnosis not present

## 2020-10-02 DIAGNOSIS — M10371 Gout due to renal impairment, right ankle and foot: Secondary | ICD-10-CM | POA: Diagnosis not present

## 2020-10-03 ENCOUNTER — Other Ambulatory Visit: Payer: Self-pay

## 2020-10-03 ENCOUNTER — Inpatient Hospital Stay: Payer: Medicare HMO

## 2020-10-03 ENCOUNTER — Telehealth: Payer: Self-pay | Admitting: Oncology

## 2020-10-03 VITALS — BP 149/73 | HR 88 | Temp 98.1°F | Resp 20

## 2020-10-03 DIAGNOSIS — N183 Chronic kidney disease, stage 3 unspecified: Secondary | ICD-10-CM

## 2020-10-03 DIAGNOSIS — D631 Anemia in chronic kidney disease: Secondary | ICD-10-CM

## 2020-10-03 DIAGNOSIS — N184 Chronic kidney disease, stage 4 (severe): Secondary | ICD-10-CM | POA: Diagnosis not present

## 2020-10-03 LAB — CBC WITH DIFFERENTIAL/PLATELET
Abs Immature Granulocytes: 0.02 10*3/uL (ref 0.00–0.07)
Basophils Absolute: 0.1 10*3/uL (ref 0.0–0.1)
Basophils Relative: 1 %
Eosinophils Absolute: 0 10*3/uL (ref 0.0–0.5)
Eosinophils Relative: 0 %
HCT: 27.6 % — ABNORMAL LOW (ref 36.0–46.0)
Hemoglobin: 8.7 g/dL — ABNORMAL LOW (ref 12.0–15.0)
Immature Granulocytes: 1 %
Lymphocytes Relative: 16 %
Lymphs Abs: 0.6 10*3/uL — ABNORMAL LOW (ref 0.7–4.0)
MCH: 32.5 pg (ref 26.0–34.0)
MCHC: 31.5 g/dL (ref 30.0–36.0)
MCV: 103 fL — ABNORMAL HIGH (ref 80.0–100.0)
Monocytes Absolute: 0.6 10*3/uL (ref 0.1–1.0)
Monocytes Relative: 14 %
Neutro Abs: 2.7 10*3/uL (ref 1.7–7.7)
Neutrophils Relative %: 68 %
Platelets: 179 10*3/uL (ref 150–400)
RBC: 2.68 MIL/uL — ABNORMAL LOW (ref 3.87–5.11)
RDW: 15.7 % — ABNORMAL HIGH (ref 11.5–15.5)
WBC: 4 10*3/uL (ref 4.0–10.5)
nRBC: 0 % (ref 0.0–0.2)

## 2020-10-03 LAB — IRON AND TIBC
Iron: 51 ug/dL (ref 28–170)
Saturation Ratios: 14 % (ref 10.4–31.8)
TIBC: 360 ug/dL (ref 250–450)
UIBC: 309 ug/dL

## 2020-10-03 LAB — RETIC PANEL
Immature Retic Fract: 14.5 % (ref 2.3–15.9)
RBC.: 2.66 MIL/uL — ABNORMAL LOW (ref 3.87–5.11)
Retic Count, Absolute: 38.3 10*3/uL (ref 19.0–186.0)
Retic Ct Pct: 1.4 % (ref 0.4–3.1)
Reticulocyte Hemoglobin: 31.5 pg (ref >27.9)

## 2020-10-03 LAB — FERRITIN: Ferritin: 134 ng/mL (ref 11–307)

## 2020-10-03 MED ORDER — EPOETIN ALFA-EPBX 40000 UNIT/ML IJ SOLN: 30000.0000 [IU] | Freq: Once | INTRAMUSCULAR | Status: DC

## 2020-10-03 MED ORDER — EPOETIN ALFA-EPBX 10000 UNIT/ML IJ SOLN
30000.0000 [IU] | Freq: Once | INTRAMUSCULAR | Status: AC
  Administered 2020-10-03: 30000 [IU] via SUBCUTANEOUS

## 2020-10-03 NOTE — Telephone Encounter (Signed)
Left VM with patient to let her know about changes to upcoming schedule per MD. She has labs only on 8/9 and then will see the MD with possible infusion on 8/11. Sending updated appt reminder in the mail.

## 2020-10-15 ENCOUNTER — Inpatient Hospital Stay: Payer: Medicare HMO | Attending: Internal Medicine

## 2020-10-15 ENCOUNTER — Other Ambulatory Visit: Payer: Self-pay

## 2020-10-15 DIAGNOSIS — D631 Anemia in chronic kidney disease: Secondary | ICD-10-CM | POA: Diagnosis not present

## 2020-10-15 DIAGNOSIS — N184 Chronic kidney disease, stage 4 (severe): Secondary | ICD-10-CM | POA: Insufficient documentation

## 2020-10-15 DIAGNOSIS — Z888 Allergy status to other drugs, medicaments and biological substances status: Secondary | ICD-10-CM | POA: Insufficient documentation

## 2020-10-15 DIAGNOSIS — R5383 Other fatigue: Secondary | ICD-10-CM | POA: Insufficient documentation

## 2020-10-15 DIAGNOSIS — Z823 Family history of stroke: Secondary | ICD-10-CM | POA: Diagnosis not present

## 2020-10-15 DIAGNOSIS — N1832 Chronic kidney disease, stage 3b: Secondary | ICD-10-CM

## 2020-10-15 DIAGNOSIS — Z885 Allergy status to narcotic agent status: Secondary | ICD-10-CM | POA: Insufficient documentation

## 2020-10-15 DIAGNOSIS — G8929 Other chronic pain: Secondary | ICD-10-CM | POA: Diagnosis not present

## 2020-10-15 DIAGNOSIS — Z8249 Family history of ischemic heart disease and other diseases of the circulatory system: Secondary | ICD-10-CM | POA: Diagnosis not present

## 2020-10-15 DIAGNOSIS — Z886 Allergy status to analgesic agent status: Secondary | ICD-10-CM | POA: Insufficient documentation

## 2020-10-15 DIAGNOSIS — G4733 Obstructive sleep apnea (adult) (pediatric): Secondary | ICD-10-CM | POA: Diagnosis not present

## 2020-10-15 DIAGNOSIS — Z79899 Other long term (current) drug therapy: Secondary | ICD-10-CM | POA: Insufficient documentation

## 2020-10-15 DIAGNOSIS — Z811 Family history of alcohol abuse and dependence: Secondary | ICD-10-CM | POA: Insufficient documentation

## 2020-10-15 DIAGNOSIS — Z87440 Personal history of urinary (tract) infections: Secondary | ICD-10-CM | POA: Insufficient documentation

## 2020-10-15 DIAGNOSIS — Z86010 Personal history of colonic polyps: Secondary | ICD-10-CM | POA: Diagnosis not present

## 2020-10-15 DIAGNOSIS — R0602 Shortness of breath: Secondary | ICD-10-CM | POA: Insufficient documentation

## 2020-10-15 DIAGNOSIS — Z9049 Acquired absence of other specified parts of digestive tract: Secondary | ICD-10-CM | POA: Diagnosis not present

## 2020-10-15 DIAGNOSIS — M2550 Pain in unspecified joint: Secondary | ICD-10-CM | POA: Diagnosis not present

## 2020-10-15 DIAGNOSIS — Z841 Family history of disorders of kidney and ureter: Secondary | ICD-10-CM | POA: Diagnosis not present

## 2020-10-15 DIAGNOSIS — M25569 Pain in unspecified knee: Secondary | ICD-10-CM | POA: Insufficient documentation

## 2020-10-15 DIAGNOSIS — Z836 Family history of other diseases of the respiratory system: Secondary | ICD-10-CM | POA: Diagnosis not present

## 2020-10-15 DIAGNOSIS — D7589 Other specified diseases of blood and blood-forming organs: Secondary | ICD-10-CM | POA: Diagnosis not present

## 2020-10-15 DIAGNOSIS — Z833 Family history of diabetes mellitus: Secondary | ICD-10-CM | POA: Diagnosis not present

## 2020-10-15 DIAGNOSIS — Z8051 Family history of malignant neoplasm of kidney: Secondary | ICD-10-CM | POA: Diagnosis not present

## 2020-10-15 LAB — HEMOGLOBIN AND HEMATOCRIT, BLOOD
HCT: 30.4 % — ABNORMAL LOW (ref 36.0–46.0)
Hemoglobin: 9.5 g/dL — ABNORMAL LOW (ref 12.0–15.0)

## 2020-10-17 ENCOUNTER — Ambulatory Visit: Payer: Medicare HMO

## 2020-10-17 ENCOUNTER — Inpatient Hospital Stay: Payer: Medicare HMO | Admitting: Oncology

## 2020-10-17 ENCOUNTER — Encounter: Payer: Self-pay | Admitting: Oncology

## 2020-10-17 ENCOUNTER — Other Ambulatory Visit: Payer: Self-pay

## 2020-10-17 ENCOUNTER — Other Ambulatory Visit: Payer: Medicare HMO

## 2020-10-17 ENCOUNTER — Inpatient Hospital Stay: Payer: Medicare HMO

## 2020-10-17 VITALS — BP 154/59 | HR 70 | Temp 97.3°F | Resp 18 | Wt 257.5 lb

## 2020-10-17 DIAGNOSIS — Z95828 Presence of other vascular implants and grafts: Secondary | ICD-10-CM

## 2020-10-17 DIAGNOSIS — R5383 Other fatigue: Secondary | ICD-10-CM | POA: Diagnosis not present

## 2020-10-17 DIAGNOSIS — D631 Anemia in chronic kidney disease: Secondary | ICD-10-CM | POA: Diagnosis not present

## 2020-10-17 DIAGNOSIS — R0602 Shortness of breath: Secondary | ICD-10-CM | POA: Diagnosis not present

## 2020-10-17 DIAGNOSIS — N183 Chronic kidney disease, stage 3 unspecified: Secondary | ICD-10-CM

## 2020-10-17 DIAGNOSIS — D7589 Other specified diseases of blood and blood-forming organs: Secondary | ICD-10-CM | POA: Diagnosis not present

## 2020-10-17 DIAGNOSIS — Z79899 Other long term (current) drug therapy: Secondary | ICD-10-CM | POA: Diagnosis not present

## 2020-10-17 DIAGNOSIS — M25569 Pain in unspecified knee: Secondary | ICD-10-CM | POA: Diagnosis not present

## 2020-10-17 DIAGNOSIS — Z86010 Personal history of colonic polyps: Secondary | ICD-10-CM | POA: Diagnosis not present

## 2020-10-17 DIAGNOSIS — G8929 Other chronic pain: Secondary | ICD-10-CM | POA: Diagnosis not present

## 2020-10-17 DIAGNOSIS — M2550 Pain in unspecified joint: Secondary | ICD-10-CM | POA: Diagnosis not present

## 2020-10-17 DIAGNOSIS — N184 Chronic kidney disease, stage 4 (severe): Secondary | ICD-10-CM | POA: Diagnosis not present

## 2020-10-17 MED ORDER — EPOETIN ALFA-EPBX 10000 UNIT/ML IJ SOLN: 40000.0000 [IU] | Freq: Once | INTRAMUSCULAR | Status: DC

## 2020-10-17 MED ORDER — EPOETIN ALFA-EPBX 40000 UNIT/ML IJ SOLN
40000.0000 [IU] | Freq: Once | INTRAMUSCULAR | Status: AC
  Administered 2020-10-17: 40000 [IU] via SUBCUTANEOUS

## 2020-10-17 NOTE — Progress Notes (Signed)
[facility]  [address], [address] [place], [place] [address] Phone: [phone]  Fax: [phone]   Clinic Day:  10/17/2020  Referring physician: Anthonette Legato, MD  Chief Complaint: Sara Bright is a 80 y.o. female resents for follow-up of anemia of chronic kidney disease   INTERVAL HISTORY Sara Bright is a 80 y.o. female who has above history reviewed by me today presents for follow up visit for anemia and CKD Patient previously followed up by Dr.Corcoran, patient switched care to me on 08/08/20 Extensive medical record review was performed by me  # Anemia of chronic kidney disease. She has stage III chronic kidney disease.  SPEP and free light chain ratio was normal on 11/12/2015.     She received Aranesp 200 mcg on 09/02/2016, 12/22/2016, 03/23/2017, 07/20/2017, 08/17/2017, and 09/15/2017.  She received Aranesp 300 mcg on 10/06/2017, 10/19/2017, 01/04/2018, 03/08/2018, and 05/10/2018.  She began Retacrit 10,000 units on 07/04/2018 (last 03/12/2020) then 20,000 units - present.   She received 1 unit of PRBCs on 09/22/2017.  She received Venofer x 4 (10/22/2015 - 11/05/2015), x4 (01/21/2016 - 02/11/2016), 11/24/2016, 06/22/2017, 09/14/2017, 10/01/2017, 10/12/2017, 10/26/2017, 11/16/2017, and 03/01/2018.    EGD on 11/10/2017 revealed an irregular Z-line irregular, at the gastroesophageal junction, normal stomach, and normal duodenum.  Colonoscopy on 11/10/2017 revealed diverticulosis in the sigmoid colon and non-bleeding internal hemorrhoids.    INTERVAL HISTORY Sara Bright is a 80 y.o. female who has above history reviewed by me today presents for follow up visit for management of anemia Patient reports feeling well.  Chronic knee pain, unchanged.  No new complaints.   Review of Systems  Constitutional:  Positive for fatigue. Negative for appetite change, chills and fever.  HENT:   Negative for hearing loss and voice change.   Eyes:   Negative for eye problems.  Respiratory:  Positive for shortness of breath. Negative for chest tightness and cough.   Cardiovascular:  Negative for chest pain.  Gastrointestinal:  Negative for abdominal distention, abdominal pain and blood in stool.  Endocrine: Negative for hot flashes.  Genitourinary:  Negative for difficulty urinating and frequency.   Musculoskeletal:  Positive for arthralgias.  Skin:  Negative for itching and rash.  Neurological:  Negative for extremity weakness.  Hematological:  Negative for adenopathy.  Psychiatric/Behavioral:  Negative for confusion.    Past Medical History:  Diagnosis Date   Adenoma of colon    Adenomatous colon polyp    Anemia    IDA   Cataract    Cervical stenosis of spinal canal    Chronic kidney disease    stage 3   Diabetes mellitus without complication (HCC)    GERD (gastroesophageal reflux disease)    Glaucoma    Heme positive stool    History of UTI    Hx of gallstones    Hyperkalemia    Hyperlipidemia    Hyperlipidemia    Hypertension    Lumbar stenosis    Lymphedema    Neuromuscular disorder (HCC)    Neuropathy associated with endocrine disorder (HCC)    Obesity    OSA on CPAP    on CPAP   Osteoarthritis    Osteoarthritis    Port-A-Cath in place    Retinopathy due to secondary diabetes ([place])    Secondary hyperparathyroidism of renal origin [facility])     Past Surgical History:  Procedure Laterality Date   ACDF X2     BACK SURGERY     Cataract  extraction right     catarect extraction left     CHOLECYSTECTOMY     COLONOSCOPY WITH PROPOFOL N/A 12/02/2016   Procedure: COLONOSCOPY WITH PROPOFOL;  Surgeon: Manya Silvas, MD;  Location: [facility] ENDOSCOPY;  Service: Endoscopy;  Laterality: N/A;   COLONOSCOPY WITH PROPOFOL N/A 11/10/2017   Procedure: COLONOSCOPY WITH PROPOFOL;  Surgeon: Toledo, Benay Pike, MD;  Location: [facility] ENDOSCOPY;  Service: Gastroenterology;  Laterality: N/A;   colonoscopy with removal lesions by snare      Endoscoic carpal tunnel release     ESOPHAGOGASTRODUODENOSCOPY N/A 11/10/2017   Procedure: ESOPHAGOGASTRODUODENOSCOPY (EGD);  Surgeon: Toledo, Benay Pike, MD;  Location: [facility] ENDOSCOPY;  Service: Gastroenterology;  Laterality: N/A;   ESOPHAGOGASTRODUODENOSCOPY (EGD) WITH PROPOFOL N/A 12/02/2016   Procedure: ESOPHAGOGASTRODUODENOSCOPY (EGD) WITH PROPOFOL;  Surgeon: Manya Silvas, MD;  Location: [facility] ENDOSCOPY;  Service: Endoscopy;  Laterality: N/A;   IR IMAGING GUIDED PORT INSERTION  10/01/2017   Laminectomy posterior lumbar facetectomy and formaninotomy w/Decomp     Laminectony posterior cervicle decomp w/Facectomy and foraminotomy     VEIN LIGATION AND STRIPPING Left     Family History  Problem Relation Age of Onset   Coronary artery disease Mother    Diabetes type II Mother    Hypertension Mother    Tuberculosis Father    Stroke Father    Diabetes type II Sister    Migraines Sister    Alcohol abuse Brother    Coronary artery disease Brother    Diabetes type II Brother    Kidney cancer Brother    Kidney disease Brother    Heart attack Brother    Hypertension Brother     Social History:  reports that she has never smoked. She has never used smokeless tobacco. She reports that she does not drink alcohol and does not use drugs. She lives in [place]. The patient is alone today.  Allergies:  Allergies  Allergen Reactions   Aspirin     Other reaction(s): Other (see comments)   Hydrocodone-Acetaminophen Other (See Comments)    Sedation and GI upset, pt is not allergic to acetaminophen   Other Other (See Comments)   Statins Other (See Comments)   Tramadol-Acetaminophen Nausea And Vomiting    GI upset, pt is not allergic to acetaminophen    Current Medications: Current Outpatient Medications  Medication Sig Dispense Refill   acetaminophen (TYLENOL) 500 MG tablet Take 500 mg by mouth every 6 (six) hours as needed.      amLODipine (NORVASC) 10 MG tablet Take 10 mg by mouth  daily.     aspirin EC 81 MG tablet Take 81 mg by mouth daily.     ASSURE COMFORT LANCETS 30G MISC      calcitRIOL (ROCALTROL) 0.25 MCG capsule Take 0.5 mcg by mouth 3 (three) times a week.     carvedilol (COREG) 6.25 MG tablet Take 6.25 mg by mouth 2 (two) times daily.  12   ferrous sulfate 325 (65 FE) MG tablet Take 325 mg by mouth daily.     furosemide (LASIX) 40 MG tablet Take 40 mg by mouth daily.      gabapentin (NEURONTIN) 100 MG capsule Take 100 mg by mouth 2 (two) times daily.      gemfibrozil (LOPID) 600 MG tablet Take 600 mg by mouth daily.      glimepiride (AMARYL) 1 MG tablet Take 0.5 mg by mouth daily with breakfast.     hydrALAZINE (APRESOLINE) 50 MG tablet Take 25 mg by mouth  3 (three) times daily.   12   Lancet Devices (ADJUSTABLE LANCING DEVICE) MISC      latanoprost (XALATAN) 0.005 % ophthalmic solution Place 1 drop into both eyes.      lidocaine-prilocaine (EMLA) cream Apply 1 application topically as needed. 30 g 3   losartan (COZAAR) 100 MG tablet Take 100 mg by mouth daily.     Multiple Vitamins-Minerals (CENTRUM SILVER) tablet Take 1 tablet by mouth daily.      Omega-3 Fatty Acids (FISH OIL) 1000 MG CAPS Take 1,000 mg by mouth daily.      omeprazole (PRILOSEC) 20 MG capsule Take 20 mg by mouth daily.      ONE TOUCH ULTRA TEST test strip      pioglitazone (ACTOS) 30 MG tablet Take 30 mg by mouth daily.      albuterol (PROVENTIL HFA;VENTOLIN HFA) 108 (90 Base) MCG/ACT inhaler Inhale 2 puffs into the lungs every 6 (six) hours as needed for wheezing or shortness of breath. (Patient not taking: No sig reported)     losartan (COZAAR) 50 MG tablet Take 100 mg by mouth daily. (Patient not taking: Reported on 10/17/2020)     No current facility-administered medications for this visit.     Performance status (ECOG):  1  Vitals Blood pressure (!) 154/59, pulse 70, temperature (!) 97.3 F (36.3 C), resp. rate 18, weight 257 lb 8 oz (116.8 kg), SpO2 100 %.   Physical  Exam Constitutional:      General: She is not in acute distress.    Appearance: She is well-developed. She is not diaphoretic.  HENT:     Head: Normocephalic and atraumatic.     Mouth/Throat:     Mouth: Mucous membranes are moist.     Pharynx: Oropharynx is clear. No oropharyngeal exudate.  Eyes:     General: No scleral icterus.    Pupils: Pupils are equal, round, and reactive to light.  Neck:     Vascular: No JVD.  Cardiovascular:     Rate and Rhythm: Normal rate and regular rhythm.     Heart sounds: Normal heart sounds. No murmur heard.   No gallop.  Pulmonary:     Effort: Pulmonary effort is normal. No respiratory distress.     Breath sounds: Normal breath sounds. No wheezing or rales.  Chest:     Chest wall: No tenderness.  Abdominal:     General: Bowel sounds are normal. There is no distension.     Palpations: Abdomen is soft. There is no mass.     Tenderness: There is no abdominal tenderness. There is no guarding or rebound.  Musculoskeletal:        General: No tenderness. Normal range of motion.     Cervical back: Normal range of motion and neck supple.     Right lower leg: Edema (chronic) present.     Left lower leg: Edema (chronic) present.  Lymphadenopathy:     Head:     Right side of head: No preauricular, posterior auricular or occipital adenopathy.     Left side of head: No preauricular, posterior auricular or occipital adenopathy.     Cervical: No cervical adenopathy.     Upper Body:     Right upper body: No supraclavicular or axillary adenopathy.     Left upper body: No supraclavicular or axillary adenopathy.     Lower Body: No right inguinal adenopathy. No left inguinal adenopathy.  Skin:    General: Skin is warm and dry.  Coloration: Skin is not pale.     Findings: No erythema or rash.  Neurological:     Mental Status: She is alert and oriented to person, place, and time.     Deep Tendon Reflexes: Reflexes are normal and symmetric.  Psychiatric:         Mood and Affect: Mood normal.   Appointment on 10/15/2020  Component Date Value Ref Range Status   Hemoglobin 10/15/2020 9.5 (A) 12.0 - 15.0 g/dL Final   HCT 10/15/2020 30.4 (A) 36.0 - 46.0 % Final   Performed at [facility], [address] [place], [place] [address]    Assessment and plan 1. CKD (chronic kidney disease) stage 4, GFR 15-29 ml/min (HCC)   2. Anemia in stage 4 chronic kidney disease ([place])   3. Port-A-Cath in place   4. Macrocytosis     #  Anemia of chronic renal disease Labs reviewed and discussed with patient.  Hemoglobin has improved to 9.5. Proceed with Retacrit.  Adjust the dose to 40,000 units monthly.  This is approximately her previous Retacrit dose. Hopefully with the improvement of iron stores, Retacrit may work more effectively.    #Chronic macrocytosis, She has had macrocytic RBC indices since 10/01/2017. 06/20/2020, B12 595, folate 24 Monitor.         #Chronic kidney disease avoid nephrotoxin.  Encourage hydration.  Follow-up with nephrology. #Port-A-Cath in place due to no IV access.  Recommend port flush every 8 weeks if port is not used.  RTC,  RTC  4 weeks cbc iron tibc ferritin /MD/+/- Retacrit + Venofer  I discussed the assessment and treatment plan with the patient.  The patient was provided an opportunity to ask questions and all were answered.  The patient agreed with the plan and demonstrated an understanding of the instructions.  The patient was advised to call back if the symptoms worsen or if the condition fails to improve as anticipated.  Earlie Server, MD, PhD Hematology Oncology [place] at [facility] 10/17/2020

## 2020-10-20 DIAGNOSIS — G4733 Obstructive sleep apnea (adult) (pediatric): Secondary | ICD-10-CM | POA: Diagnosis not present

## 2020-10-31 ENCOUNTER — Other Ambulatory Visit: Payer: Medicare HMO

## 2020-10-31 ENCOUNTER — Ambulatory Visit: Payer: Medicare HMO

## 2020-10-31 ENCOUNTER — Ambulatory Visit: Payer: Medicare HMO | Admitting: Oncology

## 2020-11-05 DIAGNOSIS — I10 Essential (primary) hypertension: Secondary | ICD-10-CM | POA: Diagnosis not present

## 2020-11-05 DIAGNOSIS — E1122 Type 2 diabetes mellitus with diabetic chronic kidney disease: Secondary | ICD-10-CM | POA: Diagnosis not present

## 2020-11-05 DIAGNOSIS — N184 Chronic kidney disease, stage 4 (severe): Secondary | ICD-10-CM | POA: Diagnosis not present

## 2020-11-05 DIAGNOSIS — N2581 Secondary hyperparathyroidism of renal origin: Secondary | ICD-10-CM | POA: Diagnosis not present

## 2020-11-05 DIAGNOSIS — D631 Anemia in chronic kidney disease: Secondary | ICD-10-CM | POA: Diagnosis not present

## 2020-11-14 ENCOUNTER — Inpatient Hospital Stay: Payer: Medicare HMO | Attending: Oncology

## 2020-11-14 ENCOUNTER — Other Ambulatory Visit: Payer: Self-pay

## 2020-11-14 ENCOUNTER — Inpatient Hospital Stay: Payer: Medicare HMO

## 2020-11-14 VITALS — BP 148/78 | HR 69 | Temp 98.0°F | Resp 18

## 2020-11-14 DIAGNOSIS — Z8041 Family history of malignant neoplasm of ovary: Secondary | ICD-10-CM | POA: Diagnosis not present

## 2020-11-14 DIAGNOSIS — Z87440 Personal history of urinary (tract) infections: Secondary | ICD-10-CM | POA: Diagnosis not present

## 2020-11-14 DIAGNOSIS — R5383 Other fatigue: Secondary | ICD-10-CM | POA: Insufficient documentation

## 2020-11-14 DIAGNOSIS — Z86010 Personal history of colonic polyps: Secondary | ICD-10-CM | POA: Insufficient documentation

## 2020-11-14 DIAGNOSIS — N183 Chronic kidney disease, stage 3 unspecified: Secondary | ICD-10-CM

## 2020-11-14 DIAGNOSIS — Z823 Family history of stroke: Secondary | ICD-10-CM | POA: Insufficient documentation

## 2020-11-14 DIAGNOSIS — M2550 Pain in unspecified joint: Secondary | ICD-10-CM | POA: Insufficient documentation

## 2020-11-14 DIAGNOSIS — Z886 Allergy status to analgesic agent status: Secondary | ICD-10-CM | POA: Diagnosis not present

## 2020-11-14 DIAGNOSIS — N184 Chronic kidney disease, stage 4 (severe): Secondary | ICD-10-CM | POA: Diagnosis not present

## 2020-11-14 DIAGNOSIS — Z79899 Other long term (current) drug therapy: Secondary | ICD-10-CM | POA: Insufficient documentation

## 2020-11-14 DIAGNOSIS — D7589 Other specified diseases of blood and blood-forming organs: Secondary | ICD-10-CM | POA: Diagnosis not present

## 2020-11-14 DIAGNOSIS — Z833 Family history of diabetes mellitus: Secondary | ICD-10-CM | POA: Diagnosis not present

## 2020-11-14 DIAGNOSIS — D631 Anemia in chronic kidney disease: Secondary | ICD-10-CM | POA: Insufficient documentation

## 2020-11-14 DIAGNOSIS — Z836 Family history of other diseases of the respiratory system: Secondary | ICD-10-CM | POA: Insufficient documentation

## 2020-11-14 DIAGNOSIS — Z8249 Family history of ischemic heart disease and other diseases of the circulatory system: Secondary | ICD-10-CM | POA: Insufficient documentation

## 2020-11-14 DIAGNOSIS — Z888 Allergy status to other drugs, medicaments and biological substances status: Secondary | ICD-10-CM | POA: Diagnosis not present

## 2020-11-14 DIAGNOSIS — Z811 Family history of alcohol abuse and dependence: Secondary | ICD-10-CM | POA: Diagnosis not present

## 2020-11-14 DIAGNOSIS — Z9049 Acquired absence of other specified parts of digestive tract: Secondary | ICD-10-CM | POA: Insufficient documentation

## 2020-11-14 DIAGNOSIS — Z885 Allergy status to narcotic agent status: Secondary | ICD-10-CM | POA: Diagnosis not present

## 2020-11-14 DIAGNOSIS — Z8051 Family history of malignant neoplasm of kidney: Secondary | ICD-10-CM | POA: Insufficient documentation

## 2020-11-14 LAB — RETIC PANEL
Immature Retic Fract: 12.3 % (ref 2.3–15.9)
RBC.: 2.91 MIL/uL — ABNORMAL LOW (ref 3.87–5.11)
Retic Count, Absolute: 23.9 10*3/uL (ref 19.0–186.0)
Retic Ct Pct: 0.8 % (ref 0.4–3.1)
Reticulocyte Hemoglobin: 34.6 pg (ref >27.9)

## 2020-11-14 LAB — HEMOGLOBIN AND HEMATOCRIT, BLOOD
HCT: 29.1 % — ABNORMAL LOW (ref 36.0–46.0)
Hemoglobin: 9.3 g/dL — ABNORMAL LOW (ref 12.0–15.0)

## 2020-11-14 MED ORDER — EPOETIN ALFA-EPBX 20000 UNIT/ML IJ SOLN
40000.0000 [IU] | Freq: Once | INTRAMUSCULAR | Status: AC
  Administered 2020-11-14: 40000 [IU] via SUBCUTANEOUS

## 2020-11-20 DIAGNOSIS — G4733 Obstructive sleep apnea (adult) (pediatric): Secondary | ICD-10-CM | POA: Diagnosis not present

## 2020-11-28 ENCOUNTER — Inpatient Hospital Stay: Payer: Medicare HMO

## 2020-11-28 ENCOUNTER — Encounter: Payer: Self-pay | Admitting: Oncology

## 2020-11-28 ENCOUNTER — Inpatient Hospital Stay (HOSPITAL_BASED_OUTPATIENT_CLINIC_OR_DEPARTMENT_OTHER): Payer: Medicare HMO | Admitting: Oncology

## 2020-11-28 ENCOUNTER — Other Ambulatory Visit: Payer: Self-pay

## 2020-11-28 VITALS — BP 153/61 | HR 67 | Temp 97.9°F | Resp 18 | Wt 261.0 lb

## 2020-11-28 DIAGNOSIS — Z95828 Presence of other vascular implants and grafts: Secondary | ICD-10-CM

## 2020-11-28 DIAGNOSIS — D631 Anemia in chronic kidney disease: Secondary | ICD-10-CM

## 2020-11-28 DIAGNOSIS — Z8249 Family history of ischemic heart disease and other diseases of the circulatory system: Secondary | ICD-10-CM | POA: Diagnosis not present

## 2020-11-28 DIAGNOSIS — Z86010 Personal history of colonic polyps: Secondary | ICD-10-CM | POA: Diagnosis not present

## 2020-11-28 DIAGNOSIS — N184 Chronic kidney disease, stage 4 (severe): Secondary | ICD-10-CM | POA: Diagnosis not present

## 2020-11-28 DIAGNOSIS — Z833 Family history of diabetes mellitus: Secondary | ICD-10-CM | POA: Diagnosis not present

## 2020-11-28 DIAGNOSIS — Z9049 Acquired absence of other specified parts of digestive tract: Secondary | ICD-10-CM | POA: Diagnosis not present

## 2020-11-28 DIAGNOSIS — D7589 Other specified diseases of blood and blood-forming organs: Secondary | ICD-10-CM | POA: Diagnosis not present

## 2020-11-28 DIAGNOSIS — Z8041 Family history of malignant neoplasm of ovary: Secondary | ICD-10-CM | POA: Diagnosis not present

## 2020-11-28 DIAGNOSIS — Z888 Allergy status to other drugs, medicaments and biological substances status: Secondary | ICD-10-CM | POA: Diagnosis not present

## 2020-11-28 DIAGNOSIS — R5383 Other fatigue: Secondary | ICD-10-CM | POA: Diagnosis not present

## 2020-11-28 DIAGNOSIS — Z885 Allergy status to narcotic agent status: Secondary | ICD-10-CM | POA: Diagnosis not present

## 2020-11-28 DIAGNOSIS — M2550 Pain in unspecified joint: Secondary | ICD-10-CM | POA: Diagnosis not present

## 2020-11-28 DIAGNOSIS — Z811 Family history of alcohol abuse and dependence: Secondary | ICD-10-CM | POA: Diagnosis not present

## 2020-11-28 DIAGNOSIS — Z8051 Family history of malignant neoplasm of kidney: Secondary | ICD-10-CM | POA: Diagnosis not present

## 2020-11-28 DIAGNOSIS — Z79899 Other long term (current) drug therapy: Secondary | ICD-10-CM | POA: Diagnosis not present

## 2020-11-28 DIAGNOSIS — Z823 Family history of stroke: Secondary | ICD-10-CM | POA: Diagnosis not present

## 2020-11-28 DIAGNOSIS — Z886 Allergy status to analgesic agent status: Secondary | ICD-10-CM | POA: Diagnosis not present

## 2020-11-28 DIAGNOSIS — Z836 Family history of other diseases of the respiratory system: Secondary | ICD-10-CM | POA: Diagnosis not present

## 2020-11-28 DIAGNOSIS — Z87440 Personal history of urinary (tract) infections: Secondary | ICD-10-CM | POA: Diagnosis not present

## 2020-11-28 MED ORDER — HEPARIN SOD (PORK) LOCK FLUSH 100 UNIT/ML IV SOLN
500.0000 [IU] | Freq: Once | INTRAVENOUS | Status: AC
  Administered 2020-11-28: 500 [IU] via INTRAVENOUS
  Filled 2020-11-28: qty 5

## 2020-11-28 MED ORDER — SODIUM CHLORIDE 0.9% FLUSH
10.0000 mL | Freq: Once | INTRAVENOUS | Status: AC
  Administered 2020-11-28: 10 mL via INTRAVENOUS
  Filled 2020-11-28: qty 10

## 2020-11-28 NOTE — Progress Notes (Signed)
Hematology oncology progress note   Clinic Day:  11/28/2020  Referring physician: Anthonette Legato, MD  Chief Complaint: Sara Bright is a 80 y.o. female resents for follow-up of anemia of chronic kidney disease   INTERVAL HISTORY Sara Bright is a 81 y.o. female who has above history reviewed by me today presents for follow up visit for anemia and CKD Patient previously followed up by Dr.Corcoran, patient switched care to me on 08/08/20 Extensive medical record review was performed by me  # Anemia of chronic kidney disease. She has stage III chronic kidney disease.  SPEP and free light chain ratio was normal on 11/12/2015.     She received Aranesp 200 mcg on 09/02/2016, 12/22/2016, 03/23/2017, 07/20/2017, 08/17/2017, and 09/15/2017.  She received Aranesp 300 mcg on 10/06/2017, 10/19/2017, 01/04/2018, 03/08/2018, and 05/10/2018.  She began Retacrit 10,000 units on 07/04/2018 (last 03/12/2020) then 20,000 units - present.   She received 1 unit of PRBCs on 09/22/2017.  She received Venofer x 4 (10/22/2015 - 11/05/2015), x4 (01/21/2016 - 02/11/2016), 11/24/2016, 06/22/2017, 09/14/2017, 10/01/2017, 10/12/2017, 10/26/2017, 11/16/2017, and 03/01/2018.    EGD on 11/10/2017 revealed an irregular Z-line irregular, at the gastroesophageal junction, normal stomach, and normal duodenum.  Colonoscopy on 11/10/2017 revealed diverticulosis in the sigmoid colon and non-bleeding internal hemorrhoids.    INTERVAL HISTORY Sara Bright is a 80 y.o. female who has above history reviewed by me today presents for follow up visit for management of anemia Patient reports feeling well.  She has been on Retacrit 40,000 units every 4 weeks.  Last received Retacrit 11/14/2020. Fatigue level has improved.  No new complaints.   Review of Systems  Constitutional:  Positive for fatigue. Negative for appetite change, chills and fever.  HENT:   Negative for hearing loss and voice change.   Eyes:  Negative for  eye problems.  Respiratory:  Negative for chest tightness, cough and shortness of breath.   Cardiovascular:  Negative for chest pain.  Gastrointestinal:  Negative for abdominal distention, abdominal pain and blood in stool.  Endocrine: Negative for hot flashes.  Genitourinary:  Negative for difficulty urinating and frequency.   Musculoskeletal:  Positive for arthralgias.  Skin:  Negative for itching and rash.  Neurological:  Negative for extremity weakness.  Hematological:  Negative for adenopathy.  Psychiatric/Behavioral:  Negative for confusion.    Past Medical History:  Diagnosis Date   Adenoma of colon    Adenomatous colon polyp    Anemia    IDA   Cataract    Cervical stenosis of spinal canal    Chronic kidney disease    stage 3   Diabetes mellitus without complication (HCC)    GERD (gastroesophageal reflux disease)    Glaucoma    Heme positive stool    History of UTI    Hx of gallstones    Hyperkalemia    Hyperlipidemia    Hyperlipidemia    Hypertension    Lumbar stenosis    Lymphedema    Neuromuscular disorder (HCC)    Neuropathy associated with endocrine disorder (HCC)    Obesity    OSA on CPAP    on CPAP   Osteoarthritis    Osteoarthritis    Port-A-Cath in place    Retinopathy due to secondary diabetes ([place])    Secondary hyperparathyroidism of renal origin [facility])     Past Surgical History:  Procedure Laterality Date   ACDF X2     BACK SURGERY     Cataract extraction right  catarect extraction left     CHOLECYSTECTOMY     COLONOSCOPY WITH PROPOFOL N/A 12/02/2016   Procedure: COLONOSCOPY WITH PROPOFOL;  Surgeon: Manya Silvas, MD;  Location: [facility] ENDOSCOPY;  Service: Endoscopy;  Laterality: N/A;   COLONOSCOPY WITH PROPOFOL N/A 11/10/2017   Procedure: COLONOSCOPY WITH PROPOFOL;  Surgeon: Toledo, Benay Pike, MD;  Location: [facility] ENDOSCOPY;  Service: Gastroenterology;  Laterality: N/A;   colonoscopy with removal lesions by snare     Endoscoic carpal  tunnel release     ESOPHAGOGASTRODUODENOSCOPY N/A 11/10/2017   Procedure: ESOPHAGOGASTRODUODENOSCOPY (EGD);  Surgeon: Toledo, Benay Pike, MD;  Location: [facility] ENDOSCOPY;  Service: Gastroenterology;  Laterality: N/A;   ESOPHAGOGASTRODUODENOSCOPY (EGD) WITH PROPOFOL N/A 12/02/2016   Procedure: ESOPHAGOGASTRODUODENOSCOPY (EGD) WITH PROPOFOL;  Surgeon: Manya Silvas, MD;  Location: [facility] ENDOSCOPY;  Service: Endoscopy;  Laterality: N/A;   IR IMAGING GUIDED PORT INSERTION  10/01/2017   Laminectomy posterior lumbar facetectomy and formaninotomy w/Decomp     Laminectony posterior cervicle decomp w/Facectomy and foraminotomy     VEIN LIGATION AND STRIPPING Left     Family History  Problem Relation Age of Onset   Coronary artery disease Mother    Diabetes type II Mother    Hypertension Mother    Tuberculosis Father    Stroke Father    Diabetes type II Sister    Migraines Sister    Alcohol abuse Brother    Coronary artery disease Brother    Diabetes type II Brother    Kidney cancer Brother    Kidney disease Brother    Heart attack Brother    Hypertension Brother     Social History:  reports that she has never smoked. She has never used smokeless tobacco. She reports that she does not drink alcohol and does not use drugs. She lives in [place]. The patient is alone today.  Allergies:  Allergies  Allergen Reactions   Aspirin     Other reaction(s): Other (see comments)   Hydrocodone-Acetaminophen Other (See Comments)    Sedation and GI upset, pt is not allergic to acetaminophen   Other Other (See Comments)   Statins Other (See Comments)   Tramadol-Acetaminophen Nausea And Vomiting    GI upset, pt is not allergic to acetaminophen    Current Medications: Current Outpatient Medications  Medication Sig Dispense Refill   acetaminophen (TYLENOL) 500 MG tablet Take 500 mg by mouth every 6 (six) hours as needed.      albuterol (PROVENTIL HFA;VENTOLIN HFA) 108 (90 Base) MCG/ACT inhaler Inhale 2  puffs into the lungs every 6 (six) hours as needed for wheezing or shortness of breath.     amLODipine (NORVASC) 10 MG tablet Take 10 mg by mouth daily.     aspirin EC 81 MG tablet Take 81 mg by mouth daily.     ASSURE COMFORT LANCETS 30G MISC      calcitRIOL (ROCALTROL) 0.25 MCG capsule Take 0.5 mcg by mouth 3 (three) times a week.     carvedilol (COREG) 6.25 MG tablet Take 6.25 mg by mouth 2 (two) times daily.  12   ferrous sulfate 325 (65 FE) MG tablet Take 325 mg by mouth daily.     furosemide (LASIX) 40 MG tablet Take 40 mg by mouth daily.      gabapentin (NEURONTIN) 100 MG capsule Take 100 mg by mouth 2 (two) times daily.      gemfibrozil (LOPID) 600 MG tablet Take 600 mg by mouth daily.      glimepiride (AMARYL)  1 MG tablet Take 0.5 mg by mouth daily with breakfast.     hydrALAZINE (APRESOLINE) 50 MG tablet Take 25 mg by mouth 3 (three) times daily.   12   Lancet Devices (ADJUSTABLE LANCING DEVICE) MISC      latanoprost (XALATAN) 0.005 % ophthalmic solution Place 1 drop into both eyes.      lidocaine-prilocaine (EMLA) cream Apply 1 application topically as needed. 30 g 3   losartan (COZAAR) 100 MG tablet Take 100 mg by mouth daily.     Multiple Vitamins-Minerals (CENTRUM SILVER) tablet Take 1 tablet by mouth daily.      Omega-3 Fatty Acids (FISH OIL) 1000 MG CAPS Take 1,000 mg by mouth daily.      omeprazole (PRILOSEC) 20 MG capsule Take 20 mg by mouth daily.      ONE TOUCH ULTRA TEST test strip      pioglitazone (ACTOS) 30 MG tablet Take 30 mg by mouth daily.      No current facility-administered medications for this visit.     Performance status (ECOG):  1  Vitals Blood pressure (!) 153/61, pulse 67, temperature 97.9 F (36.6 C), resp. rate 18, weight 261 lb 0.4 oz (118.4 kg).   Physical Exam Constitutional:      General: She is not in acute distress.    Appearance: She is well-developed. She is not diaphoretic.  HENT:     Head: Normocephalic and atraumatic.      Mouth/Throat:     Mouth: Mucous membranes are moist.     Pharynx: Oropharynx is clear. No oropharyngeal exudate.  Eyes:     General: No scleral icterus.    Pupils: Pupils are equal, round, and reactive to light.  Neck:     Vascular: No JVD.  Cardiovascular:     Rate and Rhythm: Normal rate and regular rhythm.     Heart sounds: Normal heart sounds. No murmur heard.   No gallop.  Pulmonary:     Effort: Pulmonary effort is normal. No respiratory distress.     Breath sounds: Normal breath sounds. No wheezing or rales.  Chest:     Chest wall: No tenderness.  Abdominal:     General: Bowel sounds are normal. There is no distension.     Palpations: Abdomen is soft. There is no mass.     Tenderness: There is no abdominal tenderness. There is no guarding or rebound.  Musculoskeletal:        General: No tenderness. Normal range of motion.     Cervical back: Normal range of motion and neck supple.     Right lower leg: Edema (chronic) present.     Left lower leg: Edema (chronic) present.  Lymphadenopathy:     Head:     Right side of head: No preauricular, posterior auricular or occipital adenopathy.     Left side of head: No preauricular, posterior auricular or occipital adenopathy.     Cervical: No cervical adenopathy.     Upper Body:     Right upper body: No supraclavicular or axillary adenopathy.     Left upper body: No supraclavicular or axillary adenopathy.     Lower Body: No right inguinal adenopathy. No left inguinal adenopathy.  Skin:    General: Skin is warm and dry.     Coloration: Skin is not pale.     Findings: No erythema or rash.  Neurological:     Mental Status: She is alert and oriented to person, place, and time.  Deep Tendon Reflexes: Reflexes are normal and symmetric.  Psychiatric:        Mood and Affect: Mood normal.   No visits with results within 3 Day(s) from this visit.  Latest known visit with results is:  Appointment on 11/14/2020  Component Date Value  Ref Range Status   Retic Ct Pct 11/14/2020 0.8  0.4 - 3.1 % Final   RBC. 11/14/2020 2.91 (A) 3.87 - 5.11 MIL/uL Final   Retic Count, Absolute 11/14/2020 23.9  19.0 - 186.0 K/uL Final   Immature Retic Fract 11/14/2020 12.3  2.3 - 15.9 % Final   Reticulocyte Hemoglobin 11/14/2020 34.6  >27.9 pg Final   Comment:        Given the high negative predictive value of a RET-He result > 32 pg iron deficiency is essentially excluded. If this patient is anemic other etiologies should be considered. Performed at [facility], [address] [place], [place] [address]    Hemoglobin 11/14/2020 9.3 (A) 12.0 - 15.0 g/dL Final   HCT 11/14/2020 29.1 (A) 36.0 - 46.0 % Final   Performed at [facility], [address], [place], [place] [address]    Assessment and plan 1. CKD (chronic kidney disease) stage 4, GFR 15-29 ml/min (HCC)   2. Anemia in stage 4 chronic kidney disease ([place])   3. Port-A-Cath in place     #  Anemia of chronic renal disease Hemoglobin appears to be stable between 9 and 10. Recommend patient continue Retacrit 40,000 units monthly.   #Chronic macrocytosis, She has had macrocytic RBC indices since 10/01/2017.          #Chronic kidney disease avoid nephrotoxin.  Encourage hydration.  Follow-up with nephrology. #Port-A-Cath in place due to no IV access.  Recommend port flush every 8 weeks if port is not used.  RTC 12/12/2020, H&H iron TIBC ferritin +/- Retacrit. 01/09/2021 H&H +/- Retacrit 02/06/2021 lab-CBC iron TIBC ferritin MD Retacrit.   I discussed the assessment and treatment plan with the patient.  The patient was provided an opportunity to ask questions and all were answered.  The patient agreed with the plan and demonstrated an understanding of the instructions.  The patient was advised to call back if the symptoms worsen or if the condition fails to improve as anticipated.  Earlie Server, MD, PhD Hematology Oncology [place] at  [facility] 11/28/2020

## 2020-12-09 ENCOUNTER — Ambulatory Visit
Admission: EM | Admit: 2020-12-09 | Discharge: 2020-12-09 | Disposition: A | Discharge status: Home / Self Care | Payer: Medicare HMO | Attending: Family Medicine | Admitting: Family Medicine

## 2020-12-09 ENCOUNTER — Other Ambulatory Visit: Payer: Self-pay

## 2020-12-09 ENCOUNTER — Ambulatory Visit (INDEPENDENT_AMBULATORY_CARE_PROVIDER_SITE_OTHER): Payer: Medicare HMO

## 2020-12-09 DIAGNOSIS — M25571 Pain in right ankle and joints of right foot: Secondary | ICD-10-CM | POA: Diagnosis not present

## 2020-12-09 DIAGNOSIS — R609 Edema, unspecified: Secondary | ICD-10-CM | POA: Diagnosis not present

## 2020-12-09 DIAGNOSIS — S92301A Fracture of unspecified metatarsal bone(s), right foot, initial encounter for closed fracture: Secondary | ICD-10-CM

## 2020-12-09 DIAGNOSIS — M7989 Other specified soft tissue disorders: Secondary | ICD-10-CM | POA: Diagnosis not present

## 2020-12-09 MED ORDER — PREDNISONE 10 MG PO TABS: ORAL_TABLET | ORAL | 0 refills | Status: DC

## 2020-12-09 MED ORDER — HYDROCODONE-ACETAMINOPHEN 5-325 MG PO TABS: 1.0000 | ORAL_TABLET | Freq: Three times a day (TID) | ORAL | 0 refills | Status: DC | PRN

## 2020-12-09 MED ORDER — ONDANSETRON HCL 4 MG PO TABS: 4.0000 mg | ORAL_TABLET | Freq: Three times a day (TID) | ORAL | 0 refills | Status: AC | PRN

## 2020-12-09 NOTE — ED Triage Notes (Signed)
Pt presents with pain and swelling in her right foot x 2 days. History of gout.

## 2020-12-09 NOTE — Discharge Instructions (Addendum)
Rest, ice, elevation.  Medication as prescribed.  Please call [facility] [phone]) OR [facility] [phone]) for an appt.  Take care  Dr. Lacinda Axon

## 2020-12-09 NOTE — ED Provider Notes (Signed)
[facility]    CSN: [id number] Arrival date & time: 12/09/20  1659      History   Chief Complaint Chief Complaint  Patient presents with   Foot Swelling    HPI 80 year old female presents with the above complaint.  Patient reports that her symptoms started yesterday.  She states that she has had severe swelling of the right foot and ankle.  She has known chronic venous insufficiency and associated lower extremity edema.  She has other significant medical problems as outlined below.  Patient reports that she suffered a fall due to the pain.  Patient states that she is currently having pain diffusely throughout the foot as well as around the ankle.  Patient is concerned she may have a gout.  She has a history of gout and states that this feels similar to prior gout flares.  Her pain is currently 10/10 in severity.  No relieving factors.  Past Medical History:  Diagnosis Date   Adenoma of colon    Adenomatous colon polyp    Anemia    IDA   Cataract    Cervical stenosis of spinal canal    Chronic kidney disease    stage 3   Diabetes mellitus without complication (HCC)    GERD (gastroesophageal reflux disease)    Glaucoma    Heme positive stool    History of UTI    Hx of gallstones    Hyperkalemia    Hyperlipidemia    Hyperlipidemia    Hypertension    Lumbar stenosis    Lymphedema    Neuromuscular disorder ([place])    Neuropathy associated with endocrine disorder ([place])    Obesity    OSA on CPAP    on CPAP   Osteoarthritis    Osteoarthritis    Port-A-Cath in place    Retinopathy due to secondary diabetes ([place])    Secondary hyperparathyroidism of renal origin [facility])     Patient Active Problem List   Diagnosis Date Noted   Acute idiopathic gout of left foot 06/20/2019   Edema of lower extremity 03/19/2019   Hyperkalemia 03/19/2019   GERD (gastroesophageal reflux disease) 01/20/2019   Hyperlipidemia 01/20/2019   Claudication ([place]) 01/07/2019   Venous  ulcer of left leg ([place]) 08/13/2018   Diabetic polyneuropathy associated with type 2 diabetes mellitus ([place]) 05/30/2018   Macrocytosis 04/26/2018   Fecal occult blood test positive 10/08/2017   IDA (iron deficiency anemia) 10/08/2017   Chronic venous insufficiency 12/25/2016   Lymphedema 12/25/2016   Diabetes ([place]) 12/25/2016   Essential hypertension 12/25/2016   Hx of adenomatous colonic polyps 10/22/2016   DOE (dyspnea on exertion) 07/22/2016   CKD (chronic kidney disease) stage 3, GFR 30-59 ml/min (HCC) 06/23/2016   Aortic atherosclerosis ([place]) 03/09/2016   Asthma with chronic obstructive pulmonary disease (COPD) ([place]) 03/09/2016   Swelling of limb 12/31/2015   Severe obesity (BMI >= 40) ([place]) 12/31/2015   Varicose veins of both legs with edema 12/31/2015   VV (varicose veins) 11/27/2015   Other iron deficiency anemia 10/08/2015   Anemia due to stage 3 chronic kidney disease ([place]) 10/08/2015   BMI 45.0-49.9, adult ([place]) 10/05/2015   OSA on CPAP 03/10/2015   Lumbar stenosis with neurogenic claudication 09/30/2011    Past Surgical History:  Procedure Laterality Date   ACDF X2     BACK SURGERY     Cataract extraction right     catarect extraction left     CHOLECYSTECTOMY     COLONOSCOPY  WITH PROPOFOL N/A 12/02/2016   Procedure: COLONOSCOPY WITH PROPOFOL;  Surgeon: Manya Silvas, MD;  Location: [facility] ENDOSCOPY;  Service: Endoscopy;  Laterality: N/A;   COLONOSCOPY WITH PROPOFOL N/A 11/10/2017   Procedure: COLONOSCOPY WITH PROPOFOL;  Surgeon: Toledo, Benay Pike, MD;  Location: [facility] ENDOSCOPY;  Service: Gastroenterology;  Laterality: N/A;   colonoscopy with removal lesions by snare     Endoscoic carpal tunnel release     ESOPHAGOGASTRODUODENOSCOPY N/A 11/10/2017   Procedure: ESOPHAGOGASTRODUODENOSCOPY (EGD);  Surgeon: Toledo, Benay Pike, MD;  Location: [facility] ENDOSCOPY;  Service: Gastroenterology;  Laterality: N/A;   ESOPHAGOGASTRODUODENOSCOPY (EGD) WITH PROPOFOL N/A 12/02/2016    Procedure: ESOPHAGOGASTRODUODENOSCOPY (EGD) WITH PROPOFOL;  Surgeon: Manya Silvas, MD;  Location: [facility] ENDOSCOPY;  Service: Endoscopy;  Laterality: N/A;   IR IMAGING GUIDED PORT INSERTION  10/01/2017   Laminectomy posterior lumbar facetectomy and formaninotomy w/Decomp     Laminectony posterior cervicle decomp w/Facectomy and foraminotomy     VEIN LIGATION AND STRIPPING Left     OB History   No obstetric history on file.      Home Medications    Prior to Admission medications   Medication Sig Start Date End Date Taking? Authorizing Provider  HYDROcodone-acetaminophen (NORCO/VICODIN) 5-325 MG tablet Take 1 tablet by mouth every 8 (eight) hours as needed. 12/09/20  Yes Cherryl Babin G, DO  ondansetron (ZOFRAN) 4 MG tablet Take 1 tablet (4 mg total) by mouth every 8 (eight) hours as needed for nausea or vomiting. 12/09/20  Yes Coreyon Nicotra G, DO  acetaminophen (TYLENOL) 500 MG tablet Take 500 mg by mouth every 6 (six) hours as needed.     [provider]  albuterol (PROVENTIL HFA;VENTOLIN HFA) 108 (90 Base) MCG/ACT inhaler Inhale 2 puffs into the lungs every 6 (six) hours as needed for wheezing or shortness of breath.    [provider]  amLODipine (NORVASC) 10 MG tablet Take 10 mg by mouth daily. 06/14/19   [provider]  aspirin EC 81 MG tablet Take 81 mg by mouth daily.    [provider]  ASSURE COMFORT LANCETS 30G MISC  07/22/16   [provider]  calcitRIOL (ROCALTROL) 0.25 MCG capsule Take 0.5 mcg by mouth 3 (three) times a week. 09/24/15   [provider]  carvedilol (COREG) 6.25 MG tablet Take 6.25 mg by mouth 2 (two) times daily. 05/06/16   [provider]  ferrous sulfate 325 (65 FE) MG tablet Take 325 mg by mouth daily.    [provider]  furosemide (LASIX) 40 MG tablet Take 40 mg by mouth daily.  08/20/15   [provider]  gabapentin (NEURONTIN) 100 MG capsule Take 100 mg by mouth 2 (two) times daily.   07/15/15   [provider]  gemfibrozil (LOPID) 600 MG tablet Take 600 mg by mouth daily.  08/09/15   [provider]  glimepiride (AMARYL) 1 MG tablet Take 0.5 mg by mouth daily with breakfast. 08/20/15   [provider]  hydrALAZINE (APRESOLINE) 50 MG tablet Take 25 mg by mouth 3 (three) times daily.  11/29/17   [provider]  Lancet Devices (ADJUSTABLE LANCING DEVICE) [place]  07/22/16   [provider]  latanoprost (XALATAN) 0.005 % ophthalmic solution Place 1 drop into both eyes.  08/09/15   [provider]  lidocaine-prilocaine (EMLA) cream Apply 1 application topically as needed. 09/30/20   Earlie Server, MD  losartan (COZAAR) 100 MG tablet Take 100 mg by mouth daily. 08/13/20  [provider]  Multiple Vitamins-Minerals (CENTRUM SILVER) tablet Take 1 tablet by mouth daily.  04/30/10   [provider]  Omega-3 Fatty Acids (FISH OIL) 1000 MG CAPS Take 1,000 mg by mouth daily.  04/30/10   [provider]  omeprazole (PRILOSEC) 20 MG capsule Take 20 mg by mouth daily.  08/19/15   [provider]  ONE TOUCH ULTRA TEST test strip  07/22/16   [provider]  pioglitazone (ACTOS) 30 MG tablet Take 30 mg by mouth daily.  05/21/15 06/22/37  [provider]    Family History Family History  Problem Relation Age of Onset   Coronary artery disease Mother    Diabetes type II Mother    Hypertension Mother    Tuberculosis Father    Stroke Father    Diabetes type II Sister    Migraines Sister    Alcohol abuse Brother    Coronary artery disease Brother    Diabetes type II Brother    Kidney cancer Brother    Kidney disease Brother    Heart attack Brother    Hypertension Brother     Social History Social History   Tobacco Use   Smoking status: Never   Smokeless tobacco: Never  Vaping Use   Vaping Use: Never used  Substance Use Topics   Alcohol use: No   Drug use: No     Allergies   Aspirin,  Hydrocodone-acetaminophen, Other, Statins, and Tramadol-acetaminophen   Review of Systems Review of Systems Per HPI  Physical Exam Triage Vital Signs ED Triage Vitals  Enc Vitals Group     BP 12/09/20 1720 135/67     Pulse Rate 12/09/20 1720 62     Resp 12/09/20 1720 19     Temp 12/09/20 1720 98 F (36.7 C)     Temp src --      SpO2 12/09/20 1720 100 %     Weight --      Height --      Head Circumference --      Peak Flow --      Pain Score 12/09/20 1719 10     Pain Loc --      Pain Edu? --      Excl. in [place]? --    Updated Vital Signs BP 135/67   Pulse 62   Temp 98 F (36.7 C)   Resp 19   SpO2 100%   Visual Acuity Right Eye Distance:   Left Eye Distance:   Bilateral Distance:    Right Eye Near:   Left Eye Near:    Bilateral Near:     Physical Exam Vitals and nursing note reviewed.  Constitutional:      General: She is not in acute distress.    Appearance: She is obese.  HENT:     Head: Normocephalic and atraumatic.  Eyes:     General:        Right eye: No discharge.        Left eye: No discharge.     Conjunctiva/sclera: Conjunctivae normal.  Pulmonary:     Effort: Pulmonary effort is normal. No respiratory distress.  Musculoskeletal:     Comments: Right foot and ankle -severe edema of the right ankle.  Mildly tender to palpation.  Patient also has significant edema of the right foot and is exquisitely tender diffusely.  Neurological:     Mental Status: She is alert.  Psychiatric:        Mood and  Affect: Mood normal.        Behavior: Behavior normal.     UC Treatments / Results  Labs (all labs ordered are listed, but only abnormal results are displayed) Labs Reviewed - No data to display  EKG   Radiology DG Ankle Complete Right  Result Date: 12/09/2020 CLINICAL DATA:  Right ankle pain and swelling.  Fell last night. EXAM: RIGHT ANKLE - COMPLETE 3+ VIEW COMPARISON:  None. FINDINGS: No acute fracture or dislocation. Tiny well corticated  ossific densities near the tip of the medial malleolus are likely degenerative or due to old injury. The ankle mortise is symmetric. The talar dome is intact. Joint spaces are preserved. Severe diffuse soft tissue swelling. IMPRESSION: 1. Severe diffuse soft tissue swelling. No acute osseous abnormality. Electronically Signed   By: Titus Dubin M.D.   On: 12/09/2020 18:42   DG Foot Complete Right  Result Date: 12/09/2020 CLINICAL DATA:  Pain and swelling of the right foot for 2 days. History of gout. EXAM: RIGHT FOOT COMPLETE - 3+ VIEW COMPARISON:  None. FINDINGS: Suspected nondisplaced fractures of the second, third, fourth, and fifth metatarsal heads with overlying substantial subcutaneous edema. No malalignment at the Lisfranc joint. No other fractures are observed. Plantar and Achilles calcaneal spurs. Diffuse subcutaneous edema along the ankle and forefoot especially dorsally in the forefoot. IMPRESSION: 1. Suspected acute nondisplaced fractures in the distal metaphyses of the second, third, fourth, and fifth metatarsals. Overlying subcutaneous edema. 2. Plantar and Achilles calcaneal spurs. Electronically Signed   By: Van Clines M.D.   On: 12/09/2020 18:42    Procedures Procedures (including critical care time)  Medications Ordered in UC Medications - No data to display  Initial Impression / Assessment and Plan / UC Course  I have reviewed the triage vital signs and the nursing notes.  Pertinent labs & imaging results that were available during my care of the patient were reviewed by me and considered in my medical decision making (see chart for details).    80 year old female presents for evaluation of the above.  X-rays were obtained of the right ankle as well as right foot.  X-rays of the right ankle were negative.  X-rays of the right foot revealed nondisplaced distal metatarsal fractures -second, third, fourth, and fifth.  This is an acute complicated injury.  Patient was  placed in a cam walker and advised to avoid weightbearing if possible.  She is to use her walker at home.  I did not feel that she would tolerate a posterior splint as I feel that this would make her at a higher risk for fall.  Patient will follow-up with orthopedics.  Hydrocodone as needed for pain.  Patient does have GI upset and nausea from hydrocodone.  Given the severity of her pain and the fractures, we elected to proceed with hydrocodone while premedicated with Zofran.  Final Clinical Impressions(s) / UC Diagnoses   Final diagnoses:  Acute right ankle pain  Multiple closed fractures of metatarsal bone of right foot, initial encounter     Discharge Instructions      Rest, ice, elevation.  Medication as prescribed.  Please call [facility] [phone]) OR [facility] [phone]) for an appt.  Take care  Dr. Lacinda Axon      ED Prescriptions     Medication Sig Dispense Auth. Provider   predniSONE (DELTASONE) 10 MG tablet  (Status: Discontinued) 50 mg daily x 2 days, then 40 mg daily x 2 days, then 30 mg daily x  2 days, then 20 mg daily x 2 days, then 10 mg daily x 2 days. 30 tablet Izzabella Besse G, DO   HYDROcodone-acetaminophen (NORCO/VICODIN) 5-325 MG tablet Take 1 tablet by mouth every 8 (eight) hours as needed. 10 tablet Markell Schrier G, DO   ondansetron (ZOFRAN) 4 MG tablet Take 1 tablet (4 mg total) by mouth every 8 (eight) hours as needed for nausea or vomiting. 20 tablet Thersa Salt G, DO      I have reviewed the PDMP during this encounter.   [place]oral [place], [place] 12/09/20 2102

## 2020-12-11 DIAGNOSIS — R9431 Abnormal electrocardiogram [ECG] [EKG]: Secondary | ICD-10-CM | POA: Diagnosis not present

## 2020-12-11 DIAGNOSIS — K5900 Constipation, unspecified: Secondary | ICD-10-CM | POA: Diagnosis not present

## 2020-12-11 DIAGNOSIS — M6281 Muscle weakness (generalized): Secondary | ICD-10-CM | POA: Diagnosis not present

## 2020-12-11 DIAGNOSIS — I10 Essential (primary) hypertension: Secondary | ICD-10-CM | POA: Diagnosis not present

## 2020-12-11 DIAGNOSIS — Z736 Limitation of activities due to disability: Secondary | ICD-10-CM | POA: Diagnosis not present

## 2020-12-11 DIAGNOSIS — S92331A Displaced fracture of third metatarsal bone, right foot, initial encounter for closed fracture: Secondary | ICD-10-CM | POA: Diagnosis not present

## 2020-12-11 DIAGNOSIS — B9620 Unspecified Escherichia coli [E. coli] as the cause of diseases classified elsewhere: Secondary | ICD-10-CM | POA: Diagnosis not present

## 2020-12-11 DIAGNOSIS — R279 Unspecified lack of coordination: Secondary | ICD-10-CM | POA: Diagnosis not present

## 2020-12-11 DIAGNOSIS — R29810 Facial weakness: Secondary | ICD-10-CM | POA: Diagnosis not present

## 2020-12-11 DIAGNOSIS — R918 Other nonspecific abnormal finding of lung field: Secondary | ICD-10-CM | POA: Diagnosis not present

## 2020-12-11 DIAGNOSIS — I890 Lymphedema, not elsewhere classified: Secondary | ICD-10-CM | POA: Diagnosis not present

## 2020-12-11 DIAGNOSIS — E785 Hyperlipidemia, unspecified: Secondary | ICD-10-CM | POA: Diagnosis not present

## 2020-12-11 DIAGNOSIS — E162 Hypoglycemia, unspecified: Secondary | ICD-10-CM | POA: Diagnosis not present

## 2020-12-11 DIAGNOSIS — D631 Anemia in chronic kidney disease: Secondary | ICD-10-CM | POA: Diagnosis not present

## 2020-12-11 DIAGNOSIS — E119 Type 2 diabetes mellitus without complications: Secondary | ICD-10-CM | POA: Diagnosis not present

## 2020-12-11 DIAGNOSIS — M109 Gout, unspecified: Secondary | ICD-10-CM | POA: Diagnosis not present

## 2020-12-11 DIAGNOSIS — R4182 Altered mental status, unspecified: Secondary | ICD-10-CM | POA: Diagnosis not present

## 2020-12-11 DIAGNOSIS — R41841 Cognitive communication deficit: Secondary | ICD-10-CM | POA: Diagnosis not present

## 2020-12-11 DIAGNOSIS — E7800 Pure hypercholesterolemia, unspecified: Secondary | ICD-10-CM | POA: Diagnosis not present

## 2020-12-11 DIAGNOSIS — J9601 Acute respiratory failure with hypoxia: Secondary | ICD-10-CM | POA: Diagnosis not present

## 2020-12-11 DIAGNOSIS — J189 Pneumonia, unspecified organism: Secondary | ICD-10-CM | POA: Diagnosis not present

## 2020-12-11 DIAGNOSIS — G4733 Obstructive sleep apnea (adult) (pediatric): Secondary | ICD-10-CM | POA: Diagnosis not present

## 2020-12-11 DIAGNOSIS — W2209XA Striking against other stationary object, initial encounter: Secondary | ICD-10-CM | POA: Diagnosis not present

## 2020-12-11 DIAGNOSIS — R5381 Other malaise: Secondary | ICD-10-CM | POA: Diagnosis not present

## 2020-12-11 DIAGNOSIS — Z20822 Contact with and (suspected) exposure to covid-19: Secondary | ICD-10-CM | POA: Diagnosis not present

## 2020-12-11 DIAGNOSIS — E1122 Type 2 diabetes mellitus with diabetic chronic kidney disease: Secondary | ICD-10-CM | POA: Diagnosis not present

## 2020-12-11 DIAGNOSIS — E875 Hyperkalemia: Secondary | ICD-10-CM | POA: Diagnosis not present

## 2020-12-11 DIAGNOSIS — R600 Localized edema: Secondary | ICD-10-CM | POA: Diagnosis not present

## 2020-12-11 DIAGNOSIS — N179 Acute kidney failure, unspecified: Secondary | ICD-10-CM | POA: Diagnosis not present

## 2020-12-11 DIAGNOSIS — I129 Hypertensive chronic kidney disease with stage 1 through stage 4 chronic kidney disease, or unspecified chronic kidney disease: Secondary | ICD-10-CM | POA: Diagnosis not present

## 2020-12-11 DIAGNOSIS — K219 Gastro-esophageal reflux disease without esophagitis: Secondary | ICD-10-CM | POA: Diagnosis not present

## 2020-12-11 DIAGNOSIS — N189 Chronic kidney disease, unspecified: Secondary | ICD-10-CM | POA: Diagnosis not present

## 2020-12-11 DIAGNOSIS — N390 Urinary tract infection, site not specified: Secondary | ICD-10-CM | POA: Diagnosis not present

## 2020-12-11 DIAGNOSIS — S92351A Displaced fracture of fifth metatarsal bone, right foot, initial encounter for closed fracture: Secondary | ICD-10-CM | POA: Diagnosis not present

## 2020-12-11 DIAGNOSIS — J811 Chronic pulmonary edema: Secondary | ICD-10-CM | POA: Diagnosis not present

## 2020-12-11 DIAGNOSIS — M85872 Other specified disorders of bone density and structure, left ankle and foot: Secondary | ICD-10-CM | POA: Diagnosis not present

## 2020-12-11 DIAGNOSIS — J449 Chronic obstructive pulmonary disease, unspecified: Secondary | ICD-10-CM | POA: Diagnosis not present

## 2020-12-11 DIAGNOSIS — I440 Atrioventricular block, first degree: Secondary | ICD-10-CM | POA: Diagnosis not present

## 2020-12-11 DIAGNOSIS — E1142 Type 2 diabetes mellitus with diabetic polyneuropathy: Secondary | ICD-10-CM | POA: Diagnosis not present

## 2020-12-11 DIAGNOSIS — G9341 Metabolic encephalopathy: Secondary | ICD-10-CM | POA: Diagnosis not present

## 2020-12-11 DIAGNOSIS — J810 Acute pulmonary edema: Secondary | ICD-10-CM | POA: Diagnosis not present

## 2020-12-11 DIAGNOSIS — N184 Chronic kidney disease, stage 4 (severe): Secondary | ICD-10-CM | POA: Diagnosis not present

## 2020-12-11 DIAGNOSIS — D696 Thrombocytopenia, unspecified: Secondary | ICD-10-CM | POA: Diagnosis not present

## 2020-12-11 DIAGNOSIS — S92332A Displaced fracture of third metatarsal bone, left foot, initial encounter for closed fracture: Secondary | ICD-10-CM | POA: Diagnosis not present

## 2020-12-11 DIAGNOSIS — T68XXXA Hypothermia, initial encounter: Secondary | ICD-10-CM | POA: Diagnosis not present

## 2020-12-11 DIAGNOSIS — E11649 Type 2 diabetes mellitus with hypoglycemia without coma: Secondary | ICD-10-CM | POA: Diagnosis not present

## 2020-12-11 DIAGNOSIS — R339 Retention of urine, unspecified: Secondary | ICD-10-CM | POA: Diagnosis not present

## 2020-12-11 DIAGNOSIS — R0600 Dyspnea, unspecified: Secondary | ICD-10-CM | POA: Diagnosis not present

## 2020-12-11 DIAGNOSIS — S92341A Displaced fracture of fourth metatarsal bone, right foot, initial encounter for closed fracture: Secondary | ICD-10-CM | POA: Diagnosis not present

## 2020-12-12 ENCOUNTER — Other Ambulatory Visit: Payer: Medicare HMO

## 2020-12-12 ENCOUNTER — Ambulatory Visit: Payer: Medicare HMO

## 2020-12-12 DIAGNOSIS — R339 Retention of urine, unspecified: Secondary | ICD-10-CM | POA: Diagnosis not present

## 2020-12-12 DIAGNOSIS — N179 Acute kidney failure, unspecified: Secondary | ICD-10-CM | POA: Diagnosis not present

## 2020-12-12 DIAGNOSIS — N189 Chronic kidney disease, unspecified: Secondary | ICD-10-CM | POA: Diagnosis not present

## 2020-12-12 DIAGNOSIS — E11649 Type 2 diabetes mellitus with hypoglycemia without coma: Secondary | ICD-10-CM | POA: Diagnosis not present

## 2020-12-12 DIAGNOSIS — N390 Urinary tract infection, site not specified: Secondary | ICD-10-CM | POA: Diagnosis not present

## 2020-12-13 DIAGNOSIS — E11649 Type 2 diabetes mellitus with hypoglycemia without coma: Secondary | ICD-10-CM | POA: Diagnosis not present

## 2020-12-13 DIAGNOSIS — R339 Retention of urine, unspecified: Secondary | ICD-10-CM | POA: Diagnosis not present

## 2020-12-13 DIAGNOSIS — N390 Urinary tract infection, site not specified: Secondary | ICD-10-CM | POA: Diagnosis not present

## 2020-12-13 DIAGNOSIS — N179 Acute kidney failure, unspecified: Secondary | ICD-10-CM | POA: Diagnosis not present

## 2020-12-13 DIAGNOSIS — N189 Chronic kidney disease, unspecified: Secondary | ICD-10-CM | POA: Diagnosis not present

## 2020-12-14 DIAGNOSIS — S92332A Displaced fracture of third metatarsal bone, left foot, initial encounter for closed fracture: Secondary | ICD-10-CM | POA: Diagnosis not present

## 2020-12-14 DIAGNOSIS — N390 Urinary tract infection, site not specified: Secondary | ICD-10-CM | POA: Diagnosis not present

## 2020-12-14 DIAGNOSIS — R339 Retention of urine, unspecified: Secondary | ICD-10-CM | POA: Diagnosis not present

## 2020-12-14 DIAGNOSIS — E11649 Type 2 diabetes mellitus with hypoglycemia without coma: Secondary | ICD-10-CM | POA: Diagnosis not present

## 2020-12-14 DIAGNOSIS — M85872 Other specified disorders of bone density and structure, left ankle and foot: Secondary | ICD-10-CM | POA: Diagnosis not present

## 2020-12-14 DIAGNOSIS — N179 Acute kidney failure, unspecified: Secondary | ICD-10-CM | POA: Diagnosis not present

## 2020-12-14 DIAGNOSIS — N189 Chronic kidney disease, unspecified: Secondary | ICD-10-CM | POA: Diagnosis not present

## 2020-12-14 DIAGNOSIS — S92331A Displaced fracture of third metatarsal bone, right foot, initial encounter for closed fracture: Secondary | ICD-10-CM | POA: Diagnosis not present

## 2020-12-14 DIAGNOSIS — J189 Pneumonia, unspecified organism: Secondary | ICD-10-CM | POA: Diagnosis not present

## 2020-12-15 DIAGNOSIS — R339 Retention of urine, unspecified: Secondary | ICD-10-CM | POA: Diagnosis not present

## 2020-12-15 DIAGNOSIS — N179 Acute kidney failure, unspecified: Secondary | ICD-10-CM | POA: Diagnosis not present

## 2020-12-15 DIAGNOSIS — N390 Urinary tract infection, site not specified: Secondary | ICD-10-CM | POA: Diagnosis not present

## 2020-12-15 DIAGNOSIS — N189 Chronic kidney disease, unspecified: Secondary | ICD-10-CM | POA: Diagnosis not present

## 2020-12-15 DIAGNOSIS — E11649 Type 2 diabetes mellitus with hypoglycemia without coma: Secondary | ICD-10-CM | POA: Diagnosis not present

## 2020-12-15 DIAGNOSIS — J9601 Acute respiratory failure with hypoxia: Secondary | ICD-10-CM | POA: Diagnosis not present

## 2020-12-16 DIAGNOSIS — R918 Other nonspecific abnormal finding of lung field: Secondary | ICD-10-CM | POA: Diagnosis not present

## 2020-12-16 DIAGNOSIS — N390 Urinary tract infection, site not specified: Secondary | ICD-10-CM | POA: Diagnosis not present

## 2020-12-16 DIAGNOSIS — J811 Chronic pulmonary edema: Secondary | ICD-10-CM | POA: Diagnosis not present

## 2020-12-16 DIAGNOSIS — E11649 Type 2 diabetes mellitus with hypoglycemia without coma: Secondary | ICD-10-CM | POA: Diagnosis not present

## 2020-12-16 DIAGNOSIS — R339 Retention of urine, unspecified: Secondary | ICD-10-CM | POA: Diagnosis not present

## 2020-12-16 DIAGNOSIS — N179 Acute kidney failure, unspecified: Secondary | ICD-10-CM | POA: Diagnosis not present

## 2020-12-16 DIAGNOSIS — R0600 Dyspnea, unspecified: Secondary | ICD-10-CM | POA: Diagnosis not present

## 2020-12-16 DIAGNOSIS — J9601 Acute respiratory failure with hypoxia: Secondary | ICD-10-CM | POA: Diagnosis not present

## 2020-12-17 DIAGNOSIS — N189 Chronic kidney disease, unspecified: Secondary | ICD-10-CM | POA: Diagnosis not present

## 2020-12-17 DIAGNOSIS — N390 Urinary tract infection, site not specified: Secondary | ICD-10-CM | POA: Diagnosis not present

## 2020-12-17 DIAGNOSIS — R600 Localized edema: Secondary | ICD-10-CM | POA: Diagnosis not present

## 2020-12-17 DIAGNOSIS — R339 Retention of urine, unspecified: Secondary | ICD-10-CM | POA: Diagnosis not present

## 2020-12-17 DIAGNOSIS — J9601 Acute respiratory failure with hypoxia: Secondary | ICD-10-CM | POA: Diagnosis not present

## 2020-12-17 DIAGNOSIS — E11649 Type 2 diabetes mellitus with hypoglycemia without coma: Secondary | ICD-10-CM | POA: Diagnosis not present

## 2020-12-17 DIAGNOSIS — N179 Acute kidney failure, unspecified: Secondary | ICD-10-CM | POA: Diagnosis not present

## 2020-12-18 DIAGNOSIS — E11649 Type 2 diabetes mellitus with hypoglycemia without coma: Secondary | ICD-10-CM | POA: Diagnosis not present

## 2020-12-18 DIAGNOSIS — D631 Anemia in chronic kidney disease: Secondary | ICD-10-CM | POA: Diagnosis not present

## 2020-12-18 DIAGNOSIS — R339 Retention of urine, unspecified: Secondary | ICD-10-CM | POA: Diagnosis not present

## 2020-12-18 DIAGNOSIS — K5900 Constipation, unspecified: Secondary | ICD-10-CM | POA: Diagnosis not present

## 2020-12-18 DIAGNOSIS — N189 Chronic kidney disease, unspecified: Secondary | ICD-10-CM | POA: Diagnosis not present

## 2020-12-18 DIAGNOSIS — J9601 Acute respiratory failure with hypoxia: Secondary | ICD-10-CM | POA: Diagnosis not present

## 2020-12-19 DIAGNOSIS — J9601 Acute respiratory failure with hypoxia: Secondary | ICD-10-CM | POA: Diagnosis not present

## 2020-12-19 DIAGNOSIS — D631 Anemia in chronic kidney disease: Secondary | ICD-10-CM | POA: Diagnosis not present

## 2020-12-19 DIAGNOSIS — N189 Chronic kidney disease, unspecified: Secondary | ICD-10-CM | POA: Diagnosis not present

## 2020-12-19 DIAGNOSIS — E11649 Type 2 diabetes mellitus with hypoglycemia without coma: Secondary | ICD-10-CM | POA: Diagnosis not present

## 2020-12-20 DIAGNOSIS — J9601 Acute respiratory failure with hypoxia: Secondary | ICD-10-CM | POA: Diagnosis not present

## 2020-12-20 DIAGNOSIS — D631 Anemia in chronic kidney disease: Secondary | ICD-10-CM | POA: Diagnosis not present

## 2020-12-20 DIAGNOSIS — E11649 Type 2 diabetes mellitus with hypoglycemia without coma: Secondary | ICD-10-CM | POA: Diagnosis not present

## 2020-12-20 DIAGNOSIS — G4733 Obstructive sleep apnea (adult) (pediatric): Secondary | ICD-10-CM | POA: Diagnosis not present

## 2020-12-20 DIAGNOSIS — N189 Chronic kidney disease, unspecified: Secondary | ICD-10-CM | POA: Diagnosis not present

## 2020-12-21 DIAGNOSIS — D631 Anemia in chronic kidney disease: Secondary | ICD-10-CM | POA: Diagnosis not present

## 2020-12-21 DIAGNOSIS — E11649 Type 2 diabetes mellitus with hypoglycemia without coma: Secondary | ICD-10-CM | POA: Diagnosis not present

## 2020-12-21 DIAGNOSIS — J9601 Acute respiratory failure with hypoxia: Secondary | ICD-10-CM | POA: Diagnosis not present

## 2020-12-21 DIAGNOSIS — N189 Chronic kidney disease, unspecified: Secondary | ICD-10-CM | POA: Diagnosis not present

## 2020-12-22 DIAGNOSIS — J811 Chronic pulmonary edema: Secondary | ICD-10-CM | POA: Diagnosis not present

## 2020-12-22 DIAGNOSIS — J9601 Acute respiratory failure with hypoxia: Secondary | ICD-10-CM | POA: Diagnosis not present

## 2020-12-22 DIAGNOSIS — E11649 Type 2 diabetes mellitus with hypoglycemia without coma: Secondary | ICD-10-CM | POA: Diagnosis not present

## 2020-12-22 DIAGNOSIS — D631 Anemia in chronic kidney disease: Secondary | ICD-10-CM | POA: Diagnosis not present

## 2020-12-23 DIAGNOSIS — M85871 Other specified disorders of bone density and structure, right ankle and foot: Secondary | ICD-10-CM | POA: Diagnosis not present

## 2020-12-23 DIAGNOSIS — K5900 Constipation, unspecified: Secondary | ICD-10-CM | POA: Diagnosis not present

## 2020-12-23 DIAGNOSIS — M6281 Muscle weakness (generalized): Secondary | ICD-10-CM | POA: Diagnosis not present

## 2020-12-23 DIAGNOSIS — R109 Unspecified abdominal pain: Secondary | ICD-10-CM | POA: Diagnosis not present

## 2020-12-23 DIAGNOSIS — N390 Urinary tract infection, site not specified: Secondary | ICD-10-CM | POA: Diagnosis not present

## 2020-12-23 DIAGNOSIS — E785 Hyperlipidemia, unspecified: Secondary | ICD-10-CM | POA: Diagnosis not present

## 2020-12-23 DIAGNOSIS — I130 Hypertensive heart and chronic kidney disease with heart failure and stage 1 through stage 4 chronic kidney disease, or unspecified chronic kidney disease: Secondary | ICD-10-CM | POA: Diagnosis not present

## 2020-12-23 DIAGNOSIS — I10 Essential (primary) hypertension: Secondary | ICD-10-CM | POA: Diagnosis not present

## 2020-12-23 DIAGNOSIS — R41841 Cognitive communication deficit: Secondary | ICD-10-CM | POA: Diagnosis not present

## 2020-12-23 DIAGNOSIS — M1000 Idiopathic gout, unspecified site: Secondary | ICD-10-CM | POA: Diagnosis not present

## 2020-12-23 DIAGNOSIS — M25551 Pain in right hip: Secondary | ICD-10-CM | POA: Diagnosis not present

## 2020-12-23 DIAGNOSIS — M545 Low back pain, unspecified: Secondary | ICD-10-CM | POA: Diagnosis not present

## 2020-12-23 DIAGNOSIS — M1611 Unilateral primary osteoarthritis, right hip: Secondary | ICD-10-CM | POA: Diagnosis not present

## 2020-12-23 DIAGNOSIS — J449 Chronic obstructive pulmonary disease, unspecified: Secondary | ICD-10-CM | POA: Diagnosis not present

## 2020-12-23 DIAGNOSIS — E1122 Type 2 diabetes mellitus with diabetic chronic kidney disease: Secondary | ICD-10-CM | POA: Diagnosis not present

## 2020-12-23 DIAGNOSIS — R5381 Other malaise: Secondary | ICD-10-CM | POA: Diagnosis not present

## 2020-12-23 DIAGNOSIS — R339 Retention of urine, unspecified: Secondary | ICD-10-CM | POA: Diagnosis not present

## 2020-12-23 DIAGNOSIS — S92301A Fracture of unspecified metatarsal bone(s), right foot, initial encounter for closed fracture: Secondary | ICD-10-CM | POA: Diagnosis not present

## 2020-12-23 DIAGNOSIS — S92901A Unspecified fracture of right foot, initial encounter for closed fracture: Secondary | ICD-10-CM | POA: Diagnosis not present

## 2020-12-23 DIAGNOSIS — D509 Iron deficiency anemia, unspecified: Secondary | ICD-10-CM | POA: Diagnosis not present

## 2020-12-23 DIAGNOSIS — N189 Chronic kidney disease, unspecified: Secondary | ICD-10-CM | POA: Diagnosis not present

## 2020-12-23 DIAGNOSIS — E119 Type 2 diabetes mellitus without complications: Secondary | ICD-10-CM | POA: Diagnosis not present

## 2020-12-23 DIAGNOSIS — E1140 Type 2 diabetes mellitus with diabetic neuropathy, unspecified: Secondary | ICD-10-CM | POA: Diagnosis not present

## 2020-12-23 DIAGNOSIS — E11649 Type 2 diabetes mellitus with hypoglycemia without coma: Secondary | ICD-10-CM | POA: Diagnosis not present

## 2020-12-23 DIAGNOSIS — R54 Age-related physical debility: Secondary | ICD-10-CM | POA: Diagnosis not present

## 2020-12-23 DIAGNOSIS — R279 Unspecified lack of coordination: Secondary | ICD-10-CM | POA: Diagnosis not present

## 2020-12-23 DIAGNOSIS — N179 Acute kidney failure, unspecified: Secondary | ICD-10-CM | POA: Diagnosis not present

## 2020-12-23 DIAGNOSIS — I2510 Atherosclerotic heart disease of native coronary artery without angina pectoris: Secondary | ICD-10-CM | POA: Diagnosis not present

## 2020-12-23 DIAGNOSIS — Z736 Limitation of activities due to disability: Secondary | ICD-10-CM | POA: Diagnosis not present

## 2020-12-24 DIAGNOSIS — I130 Hypertensive heart and chronic kidney disease with heart failure and stage 1 through stage 4 chronic kidney disease, or unspecified chronic kidney disease: Secondary | ICD-10-CM | POA: Diagnosis not present

## 2020-12-24 DIAGNOSIS — M1000 Idiopathic gout, unspecified site: Secondary | ICD-10-CM | POA: Diagnosis not present

## 2020-12-24 DIAGNOSIS — E1140 Type 2 diabetes mellitus with diabetic neuropathy, unspecified: Secondary | ICD-10-CM | POA: Diagnosis not present

## 2020-12-24 DIAGNOSIS — D509 Iron deficiency anemia, unspecified: Secondary | ICD-10-CM | POA: Diagnosis not present

## 2020-12-24 DIAGNOSIS — I2510 Atherosclerotic heart disease of native coronary artery without angina pectoris: Secondary | ICD-10-CM | POA: Diagnosis not present

## 2020-12-24 DIAGNOSIS — R54 Age-related physical debility: Secondary | ICD-10-CM | POA: Diagnosis not present

## 2020-12-26 DIAGNOSIS — M85871 Other specified disorders of bone density and structure, right ankle and foot: Secondary | ICD-10-CM | POA: Diagnosis not present

## 2020-12-26 DIAGNOSIS — S92301A Fracture of unspecified metatarsal bone(s), right foot, initial encounter for closed fracture: Secondary | ICD-10-CM | POA: Diagnosis not present

## 2020-12-26 DIAGNOSIS — S92901A Unspecified fracture of right foot, initial encounter for closed fracture: Secondary | ICD-10-CM | POA: Diagnosis not present

## 2020-12-27 DIAGNOSIS — I130 Hypertensive heart and chronic kidney disease with heart failure and stage 1 through stage 4 chronic kidney disease, or unspecified chronic kidney disease: Secondary | ICD-10-CM | POA: Diagnosis not present

## 2020-12-27 DIAGNOSIS — M1000 Idiopathic gout, unspecified site: Secondary | ICD-10-CM | POA: Diagnosis not present

## 2020-12-27 DIAGNOSIS — I2510 Atherosclerotic heart disease of native coronary artery without angina pectoris: Secondary | ICD-10-CM | POA: Diagnosis not present

## 2020-12-27 DIAGNOSIS — E1140 Type 2 diabetes mellitus with diabetic neuropathy, unspecified: Secondary | ICD-10-CM | POA: Diagnosis not present

## 2020-12-30 DIAGNOSIS — E1140 Type 2 diabetes mellitus with diabetic neuropathy, unspecified: Secondary | ICD-10-CM | POA: Diagnosis not present

## 2020-12-30 DIAGNOSIS — I130 Hypertensive heart and chronic kidney disease with heart failure and stage 1 through stage 4 chronic kidney disease, or unspecified chronic kidney disease: Secondary | ICD-10-CM | POA: Diagnosis not present

## 2020-12-30 DIAGNOSIS — I2510 Atherosclerotic heart disease of native coronary artery without angina pectoris: Secondary | ICD-10-CM | POA: Diagnosis not present

## 2020-12-30 DIAGNOSIS — M1000 Idiopathic gout, unspecified site: Secondary | ICD-10-CM | POA: Diagnosis not present

## 2020-12-31 DIAGNOSIS — R109 Unspecified abdominal pain: Secondary | ICD-10-CM | POA: Diagnosis not present

## 2020-12-31 DIAGNOSIS — M1611 Unilateral primary osteoarthritis, right hip: Secondary | ICD-10-CM | POA: Diagnosis not present

## 2020-12-31 DIAGNOSIS — K5900 Constipation, unspecified: Secondary | ICD-10-CM | POA: Diagnosis not present

## 2020-12-31 DIAGNOSIS — J449 Chronic obstructive pulmonary disease, unspecified: Secondary | ICD-10-CM | POA: Diagnosis not present

## 2020-12-31 DIAGNOSIS — M25551 Pain in right hip: Secondary | ICD-10-CM | POA: Diagnosis not present

## 2020-12-31 DIAGNOSIS — E1122 Type 2 diabetes mellitus with diabetic chronic kidney disease: Secondary | ICD-10-CM | POA: Diagnosis not present

## 2020-12-31 DIAGNOSIS — E785 Hyperlipidemia, unspecified: Secondary | ICD-10-CM | POA: Diagnosis not present

## 2020-12-31 DIAGNOSIS — I10 Essential (primary) hypertension: Secondary | ICD-10-CM | POA: Diagnosis not present

## 2020-12-31 DIAGNOSIS — Z736 Limitation of activities due to disability: Secondary | ICD-10-CM | POA: Diagnosis not present

## 2020-12-31 DIAGNOSIS — R41841 Cognitive communication deficit: Secondary | ICD-10-CM | POA: Diagnosis not present

## 2020-12-31 DIAGNOSIS — E119 Type 2 diabetes mellitus without complications: Secondary | ICD-10-CM | POA: Diagnosis not present

## 2020-12-31 DIAGNOSIS — M545 Low back pain, unspecified: Secondary | ICD-10-CM | POA: Diagnosis not present

## 2020-12-31 DIAGNOSIS — M6281 Muscle weakness (generalized): Secondary | ICD-10-CM | POA: Diagnosis not present

## 2020-12-31 DIAGNOSIS — N189 Chronic kidney disease, unspecified: Secondary | ICD-10-CM | POA: Diagnosis not present

## 2021-01-01 DIAGNOSIS — K5900 Constipation, unspecified: Secondary | ICD-10-CM | POA: Diagnosis not present

## 2021-01-01 DIAGNOSIS — M545 Low back pain, unspecified: Secondary | ICD-10-CM | POA: Diagnosis not present

## 2021-01-02 ENCOUNTER — Emergency Department: Payer: Medicare HMO

## 2021-01-02 ENCOUNTER — Inpatient Hospital Stay: Payer: Medicare HMO

## 2021-01-02 ENCOUNTER — Inpatient Hospital Stay
Admission: EM | Admit: 2021-01-02 | Discharge: 2021-01-15 | DRG: 177 | Disposition: A | Discharge status: Home Health | Payer: Medicare HMO | Attending: Internal Medicine | Admitting: Internal Medicine

## 2021-01-02 ENCOUNTER — Other Ambulatory Visit: Payer: Self-pay

## 2021-01-02 DIAGNOSIS — Z8673 Personal history of transient ischemic attack (TIA), and cerebral infarction without residual deficits: Secondary | ICD-10-CM

## 2021-01-02 DIAGNOSIS — I130 Hypertensive heart and chronic kidney disease with heart failure and stage 1 through stage 4 chronic kidney disease, or unspecified chronic kidney disease: Secondary | ICD-10-CM | POA: Diagnosis not present

## 2021-01-02 DIAGNOSIS — Z7401 Bed confinement status: Secondary | ICD-10-CM | POA: Diagnosis not present

## 2021-01-02 DIAGNOSIS — E1142 Type 2 diabetes mellitus with diabetic polyneuropathy: Secondary | ICD-10-CM | POA: Diagnosis present

## 2021-01-02 DIAGNOSIS — E1122 Type 2 diabetes mellitus with diabetic chronic kidney disease: Secondary | ICD-10-CM | POA: Diagnosis not present

## 2021-01-02 DIAGNOSIS — N184 Chronic kidney disease, stage 4 (severe): Secondary | ICD-10-CM | POA: Diagnosis present

## 2021-01-02 DIAGNOSIS — R29818 Other symptoms and signs involving the nervous system: Secondary | ICD-10-CM | POA: Diagnosis not present

## 2021-01-02 DIAGNOSIS — Z7985 Long-term (current) use of injectable non-insulin antidiabetic drugs: Secondary | ICD-10-CM

## 2021-01-02 DIAGNOSIS — J9601 Acute respiratory failure with hypoxia: Secondary | ICD-10-CM | POA: Diagnosis not present

## 2021-01-02 DIAGNOSIS — T380X5A Adverse effect of glucocorticoids and synthetic analogues, initial encounter: Secondary | ICD-10-CM | POA: Diagnosis present

## 2021-01-02 DIAGNOSIS — R1310 Dysphagia, unspecified: Secondary | ICD-10-CM | POA: Diagnosis present

## 2021-01-02 DIAGNOSIS — N189 Chronic kidney disease, unspecified: Secondary | ICD-10-CM | POA: Diagnosis not present

## 2021-01-02 DIAGNOSIS — E781 Pure hyperglyceridemia: Secondary | ICD-10-CM | POA: Diagnosis present

## 2021-01-02 DIAGNOSIS — Y92239 Unspecified place in hospital as the place of occurrence of the external cause: Secondary | ICD-10-CM | POA: Diagnosis present

## 2021-01-02 DIAGNOSIS — N390 Urinary tract infection, site not specified: Secondary | ICD-10-CM | POA: Diagnosis not present

## 2021-01-02 DIAGNOSIS — Z888 Allergy status to other drugs, medicaments and biological substances status: Secondary | ICD-10-CM

## 2021-01-02 DIAGNOSIS — E119 Type 2 diabetes mellitus without complications: Secondary | ICD-10-CM

## 2021-01-02 DIAGNOSIS — E1165 Type 2 diabetes mellitus with hyperglycemia: Secondary | ICD-10-CM | POA: Diagnosis present

## 2021-01-02 DIAGNOSIS — R5381 Other malaise: Secondary | ICD-10-CM

## 2021-01-02 DIAGNOSIS — J189 Pneumonia, unspecified organism: Secondary | ICD-10-CM | POA: Diagnosis not present

## 2021-01-02 DIAGNOSIS — Z886 Allergy status to analgesic agent status: Secondary | ICD-10-CM

## 2021-01-02 DIAGNOSIS — E785 Hyperlipidemia, unspecified: Secondary | ICD-10-CM | POA: Diagnosis present

## 2021-01-02 DIAGNOSIS — R0602 Shortness of breath: Secondary | ICD-10-CM | POA: Diagnosis not present

## 2021-01-02 DIAGNOSIS — D631 Anemia in chronic kidney disease: Secondary | ICD-10-CM | POA: Diagnosis present

## 2021-01-02 DIAGNOSIS — J1282 Pneumonia due to coronavirus disease 2019: Secondary | ICD-10-CM | POA: Diagnosis not present

## 2021-01-02 DIAGNOSIS — Z831 Family history of other infectious and parasitic diseases: Secondary | ICD-10-CM

## 2021-01-02 DIAGNOSIS — D638 Anemia in other chronic diseases classified elsewhere: Secondary | ICD-10-CM

## 2021-01-02 DIAGNOSIS — E210 Primary hyperparathyroidism: Secondary | ICD-10-CM | POA: Diagnosis not present

## 2021-01-02 DIAGNOSIS — I129 Hypertensive chronic kidney disease with stage 1 through stage 4 chronic kidney disease, or unspecified chronic kidney disease: Secondary | ICD-10-CM | POA: Diagnosis present

## 2021-01-02 DIAGNOSIS — I6523 Occlusion and stenosis of bilateral carotid arteries: Secondary | ICD-10-CM | POA: Diagnosis not present

## 2021-01-02 DIAGNOSIS — Z6841 Body Mass Index (BMI) 40.0 and over, adult: Secondary | ICD-10-CM | POA: Diagnosis not present

## 2021-01-02 DIAGNOSIS — N179 Acute kidney failure, unspecified: Secondary | ICD-10-CM | POA: Diagnosis present

## 2021-01-02 DIAGNOSIS — E559 Vitamin D deficiency, unspecified: Secondary | ICD-10-CM | POA: Diagnosis not present

## 2021-01-02 DIAGNOSIS — I10 Essential (primary) hypertension: Secondary | ICD-10-CM | POA: Diagnosis not present

## 2021-01-02 DIAGNOSIS — B9689 Other specified bacterial agents as the cause of diseases classified elsewhere: Secondary | ICD-10-CM | POA: Diagnosis not present

## 2021-01-02 DIAGNOSIS — U071 COVID-19: Principal | ICD-10-CM | POA: Diagnosis present

## 2021-01-02 DIAGNOSIS — E039 Hypothyroidism, unspecified: Secondary | ICD-10-CM | POA: Diagnosis present

## 2021-01-02 DIAGNOSIS — Z9842 Cataract extraction status, left eye: Secondary | ICD-10-CM

## 2021-01-02 DIAGNOSIS — D509 Iron deficiency anemia, unspecified: Secondary | ICD-10-CM | POA: Diagnosis not present

## 2021-01-02 DIAGNOSIS — Z9841 Cataract extraction status, right eye: Secondary | ICD-10-CM

## 2021-01-02 DIAGNOSIS — N1832 Chronic kidney disease, stage 3b: Secondary | ICD-10-CM | POA: Diagnosis not present

## 2021-01-02 DIAGNOSIS — Z823 Family history of stroke: Secondary | ICD-10-CM

## 2021-01-02 DIAGNOSIS — R4781 Slurred speech: Secondary | ICD-10-CM | POA: Diagnosis not present

## 2021-01-02 DIAGNOSIS — I629 Nontraumatic intracranial hemorrhage, unspecified: Secondary | ICD-10-CM | POA: Diagnosis not present

## 2021-01-02 DIAGNOSIS — R509 Fever, unspecified: Secondary | ICD-10-CM

## 2021-01-02 DIAGNOSIS — R404 Transient alteration of awareness: Secondary | ICD-10-CM | POA: Diagnosis not present

## 2021-01-02 DIAGNOSIS — G9341 Metabolic encephalopathy: Secondary | ICD-10-CM | POA: Diagnosis not present

## 2021-01-02 DIAGNOSIS — N3001 Acute cystitis with hematuria: Secondary | ICD-10-CM | POA: Diagnosis present

## 2021-01-02 DIAGNOSIS — Z79899 Other long term (current) drug therapy: Secondary | ICD-10-CM

## 2021-01-02 DIAGNOSIS — R531 Weakness: Secondary | ICD-10-CM | POA: Diagnosis not present

## 2021-01-02 DIAGNOSIS — M1000 Idiopathic gout, unspecified site: Secondary | ICD-10-CM | POA: Diagnosis not present

## 2021-01-02 DIAGNOSIS — R5383 Other fatigue: Secondary | ICD-10-CM | POA: Diagnosis not present

## 2021-01-02 DIAGNOSIS — R4182 Altered mental status, unspecified: Secondary | ICD-10-CM | POA: Diagnosis present

## 2021-01-02 DIAGNOSIS — N183 Chronic kidney disease, stage 3 unspecified: Secondary | ICD-10-CM | POA: Diagnosis present

## 2021-01-02 DIAGNOSIS — B961 Klebsiella pneumoniae [K. pneumoniae] as the cause of diseases classified elsewhere: Secondary | ICD-10-CM | POA: Diagnosis present

## 2021-01-02 DIAGNOSIS — Z811 Family history of alcohol abuse and dependence: Secondary | ICD-10-CM

## 2021-01-02 DIAGNOSIS — Z7982 Long term (current) use of aspirin: Secondary | ICD-10-CM

## 2021-01-02 DIAGNOSIS — Z885 Allergy status to narcotic agent status: Secondary | ICD-10-CM

## 2021-01-02 DIAGNOSIS — R739 Hyperglycemia, unspecified: Secondary | ICD-10-CM | POA: Diagnosis not present

## 2021-01-02 DIAGNOSIS — E11649 Type 2 diabetes mellitus with hypoglycemia without coma: Secondary | ICD-10-CM | POA: Diagnosis not present

## 2021-01-02 DIAGNOSIS — Z7984 Long term (current) use of oral hypoglycemic drugs: Secondary | ICD-10-CM

## 2021-01-02 DIAGNOSIS — R42 Dizziness and giddiness: Secondary | ICD-10-CM | POA: Diagnosis not present

## 2021-01-02 DIAGNOSIS — R600 Localized edema: Secondary | ICD-10-CM | POA: Diagnosis present

## 2021-01-02 DIAGNOSIS — K219 Gastro-esophageal reflux disease without esophagitis: Secondary | ICD-10-CM | POA: Diagnosis present

## 2021-01-02 DIAGNOSIS — Z9049 Acquired absence of other specified parts of digestive tract: Secondary | ICD-10-CM

## 2021-01-02 DIAGNOSIS — Z1612 Extended spectrum beta lactamase (ESBL) resistance: Secondary | ICD-10-CM | POA: Diagnosis not present

## 2021-01-02 DIAGNOSIS — E1140 Type 2 diabetes mellitus with diabetic neuropathy, unspecified: Secondary | ICD-10-CM | POA: Diagnosis not present

## 2021-01-02 DIAGNOSIS — I517 Cardiomegaly: Secondary | ICD-10-CM | POA: Diagnosis not present

## 2021-01-02 DIAGNOSIS — R609 Edema, unspecified: Secondary | ICD-10-CM

## 2021-01-02 DIAGNOSIS — Z833 Family history of diabetes mellitus: Secondary | ICD-10-CM

## 2021-01-02 DIAGNOSIS — Z86010 Personal history of colonic polyps: Secondary | ICD-10-CM

## 2021-01-02 DIAGNOSIS — T68XXXA Hypothermia, initial encounter: Secondary | ICD-10-CM | POA: Diagnosis not present

## 2021-01-02 DIAGNOSIS — J90 Pleural effusion, not elsewhere classified: Secondary | ICD-10-CM | POA: Diagnosis not present

## 2021-01-02 DIAGNOSIS — G4733 Obstructive sleep apnea (adult) (pediatric): Secondary | ICD-10-CM | POA: Diagnosis present

## 2021-01-02 DIAGNOSIS — N2581 Secondary hyperparathyroidism of renal origin: Secondary | ICD-10-CM

## 2021-01-02 DIAGNOSIS — I2510 Atherosclerotic heart disease of native coronary artery without angina pectoris: Secondary | ICD-10-CM | POA: Diagnosis not present

## 2021-01-02 DIAGNOSIS — Z9181 History of falling: Secondary | ICD-10-CM

## 2021-01-02 DIAGNOSIS — H409 Unspecified glaucoma: Secondary | ICD-10-CM | POA: Diagnosis present

## 2021-01-02 DIAGNOSIS — Z87440 Personal history of urinary (tract) infections: Secondary | ICD-10-CM

## 2021-01-02 DIAGNOSIS — Z841 Family history of disorders of kidney and ureter: Secondary | ICD-10-CM

## 2021-01-02 DIAGNOSIS — Z743 Need for continuous supervision: Secondary | ICD-10-CM | POA: Diagnosis not present

## 2021-01-02 DIAGNOSIS — R197 Diarrhea, unspecified: Secondary | ICD-10-CM | POA: Diagnosis not present

## 2021-01-02 DIAGNOSIS — I959 Hypotension, unspecified: Secondary | ICD-10-CM | POA: Diagnosis not present

## 2021-01-02 DIAGNOSIS — Z8249 Family history of ischemic heart disease and other diseases of the circulatory system: Secondary | ICD-10-CM

## 2021-01-02 DIAGNOSIS — Z8051 Family history of malignant neoplasm of kidney: Secondary | ICD-10-CM

## 2021-01-02 LAB — CBC
HCT: 25.5 % — ABNORMAL LOW (ref 36.0–46.0)
HCT: 24.7 % — ABNORMAL LOW (ref 36.0–46.0)
Hemoglobin: 8.3 g/dL — ABNORMAL LOW (ref 12.0–15.0)
Hemoglobin: 8.1 g/dL — ABNORMAL LOW (ref 12.0–15.0)
MCH: 33.6 pg (ref 26.0–34.0)
MCH: 34.2 pg — ABNORMAL HIGH (ref 26.0–34.0)
MCHC: 32.5 g/dL (ref 30.0–36.0)
MCHC: 32.8 g/dL (ref 30.0–36.0)
MCV: 103.2 fL — ABNORMAL HIGH (ref 80.0–100.0)
MCV: 104.2 fL — ABNORMAL HIGH (ref 80.0–100.0)
Platelets: 190 10*3/uL (ref 150–400)
Platelets: 173 10*3/uL (ref 150–400)
RBC: 2.47 MIL/uL — ABNORMAL LOW (ref 3.87–5.11)
RBC: 2.37 MIL/uL — ABNORMAL LOW (ref 3.87–5.11)
RDW: 14.6 % (ref 11.5–15.5)
RDW: 14.7 % (ref 11.5–15.5)
WBC: 8.6 10*3/uL (ref 4.0–10.5)
WBC: 8.9 10*3/uL (ref 4.0–10.5)
nRBC: 0 % (ref 0.0–0.2)
nRBC: 0 % (ref 0.0–0.2)

## 2021-01-02 LAB — TROPONIN I (HIGH SENSITIVITY)
Troponin I (High Sensitivity): 49 ng/L — ABNORMAL HIGH (ref <18)
Troponin I (High Sensitivity): 17 ng/L (ref <18)
Troponin I (High Sensitivity): 16 ng/L (ref <18)

## 2021-01-02 LAB — TYPE AND SCREEN
ABO/RH(D): A POS
Antibody Screen: NEGATIVE

## 2021-01-02 LAB — BLOOD GAS, VENOUS
Acid-Base Excess: 5.8 mmol/L — ABNORMAL HIGH (ref 0.0–2.0)
Bicarbonate: 30.6 mmol/L — ABNORMAL HIGH (ref 20.0–28.0)
O2 Saturation: 85.8 %
Patient temperature: 37
pCO2, Ven: 45 mmHg (ref 44.0–60.0)
pH, Ven: 7.44 — ABNORMAL HIGH (ref 7.250–7.430)
pO2, Ven: 49 mmHg — ABNORMAL HIGH (ref 32.0–45.0)

## 2021-01-02 LAB — BASIC METABOLIC PANEL
Anion gap: 14 (ref 5–15)
BUN: 64 mg/dL — ABNORMAL HIGH (ref 8–23)
CO2: 28 mmol/L (ref 22–32)
Calcium: 11.4 mg/dL — ABNORMAL HIGH (ref 8.9–10.3)
Chloride: 94 mmol/L — ABNORMAL LOW (ref 98–111)
Creatinine, Ser: 2.85 mg/dL — ABNORMAL HIGH (ref 0.44–1.00)
GFR, Estimated: 16 mL/min — ABNORMAL LOW (ref >60)
Glucose, Bld: 330 mg/dL — ABNORMAL HIGH (ref 70–99)
Potassium: 3.2 mmol/L — ABNORMAL LOW (ref 3.5–5.1)
Sodium: 136 mmol/L (ref 135–145)

## 2021-01-02 LAB — HEPATIC FUNCTION PANEL
ALT: 15 U/L (ref 0–44)
AST: 20 U/L (ref 15–41)
Albumin: 2.5 g/dL — ABNORMAL LOW (ref 3.5–5.0)
Alkaline Phosphatase: 55 U/L (ref 38–126)
Bilirubin, Direct: <0.1 mg/dL (ref 0.0–0.2)
Total Bilirubin: 0.7 mg/dL (ref 0.3–1.2)
Total Protein: 6.8 g/dL (ref 6.5–8.1)

## 2021-01-02 LAB — CREATININE, SERUM
Creatinine, Ser: 2.83 mg/dL — ABNORMAL HIGH (ref 0.44–1.00)
GFR, Estimated: 16 mL/min — ABNORMAL LOW (ref >60)

## 2021-01-02 LAB — URINALYSIS, ROUTINE W REFLEX MICROSCOPIC
Bacteria, UA: NONE SEEN
Bilirubin Urine: NEGATIVE
Glucose, UA: >=500 mg/dL — AB
Ketones, ur: NEGATIVE mg/dL
Nitrite: NEGATIVE
Protein, ur: 100 mg/dL — AB
Specific Gravity, Urine: 1.014 (ref 1.005–1.030)
WBC, UA: >50 WBC/hpf — ABNORMAL HIGH (ref 0–5)
pH: 5 (ref 5.0–8.0)

## 2021-01-02 LAB — CK: Total CK: 84 U/L (ref 38–234)

## 2021-01-02 LAB — CBG MONITORING, ED
Glucose-Capillary: 159 mg/dL — ABNORMAL HIGH (ref 70–99)
Glucose-Capillary: 321 mg/dL — ABNORMAL HIGH (ref 70–99)
Glucose-Capillary: 35 mg/dL — CL (ref 70–99)
Glucose-Capillary: 83 mg/dL (ref 70–99)
Glucose-Capillary: 96 mg/dL (ref 70–99)

## 2021-01-02 LAB — RESP PANEL BY RT-PCR (FLU A&B, COVID) ARPGX2
Influenza A by PCR: NEGATIVE
Influenza B by PCR: NEGATIVE
SARS Coronavirus 2 by RT PCR: POSITIVE — AB

## 2021-01-02 LAB — BETA-HYDROXYBUTYRIC ACID: Beta-Hydroxybutyric Acid: <0.05 mmol/L — ABNORMAL LOW (ref 0.05–0.27)

## 2021-01-02 MED ORDER — DEXTROSE 50 % IV SOLN: 25.0000 g | Freq: Once | INTRAVENOUS | Status: AC

## 2021-01-02 MED ORDER — PANTOPRAZOLE SODIUM 40 MG IV SOLR
40.0000 mg | INTRAVENOUS | Status: DC
Start: 2021-01-02 — End: 2021-01-06
  Administered 2021-01-02 – 2021-01-05 (×4): 40 mg via INTRAVENOUS
  Filled 2021-01-02 (×4): qty 40

## 2021-01-02 MED ORDER — SODIUM CHLORIDE 0.9 % IV SOLN
1.0000 g | Freq: Once | INTRAVENOUS | Status: AC
  Administered 2021-01-02: 1 g via INTRAVENOUS
  Filled 2021-01-02: qty 10

## 2021-01-02 MED ORDER — INSULIN ASPART 100 UNIT/ML IJ SOLN
0.0000 [IU] | Freq: Three times a day (TID) | INTRAMUSCULAR | Status: DC
  Administered 2021-01-03: 15 [IU] via SUBCUTANEOUS
  Administered 2021-01-03: 8 [IU] via SUBCUTANEOUS
  Administered 2021-01-03: 11 [IU] via SUBCUTANEOUS
  Administered 2021-01-04: 17:00:00 5 [IU] via SUBCUTANEOUS
  Administered 2021-01-04: 13:00:00 3 [IU] via SUBCUTANEOUS
  Administered 2021-01-04: 2 [IU] via SUBCUTANEOUS
  Administered 2021-01-05: 14:00:00 3 [IU] via SUBCUTANEOUS
  Administered 2021-01-05: 17:00:00 5 [IU] via SUBCUTANEOUS
  Administered 2021-01-05: 09:00:00 3 [IU] via SUBCUTANEOUS
  Administered 2021-01-06: 13:00:00 8 [IU] via SUBCUTANEOUS
  Administered 2021-01-06: 3 [IU] via SUBCUTANEOUS
  Administered 2021-01-06: 17:00:00 5 [IU] via SUBCUTANEOUS
  Administered 2021-01-07: 8 [IU] via SUBCUTANEOUS
  Administered 2021-01-07: 5 [IU] via SUBCUTANEOUS
  Administered 2021-01-07: 3 [IU] via SUBCUTANEOUS
  Administered 2021-01-08: 2 [IU] via SUBCUTANEOUS
  Administered 2021-01-08: 5 [IU] via SUBCUTANEOUS
  Administered 2021-01-08 – 2021-01-09 (×2): 3 [IU] via SUBCUTANEOUS
  Administered 2021-01-09: 10:00:00 2 [IU] via SUBCUTANEOUS
  Administered 2021-01-09: 18:00:00 3 [IU] via SUBCUTANEOUS
  Administered 2021-01-10: 2 [IU] via SUBCUTANEOUS
  Administered 2021-01-10: 17:00:00 3 [IU] via SUBCUTANEOUS
  Administered 2021-01-11: 14:00:00 8 [IU] via SUBCUTANEOUS
  Administered 2021-01-11 – 2021-01-13 (×2): 3 [IU] via SUBCUTANEOUS
  Administered 2021-01-13: 13:00:00 5 [IU] via SUBCUTANEOUS
  Administered 2021-01-13: 10:00:00 3 [IU] via SUBCUTANEOUS
  Administered 2021-01-14: 17:00:00 5 [IU] via SUBCUTANEOUS
  Administered 2021-01-14 – 2021-01-15 (×4): 3 [IU] via SUBCUTANEOUS
  Administered 2021-01-15: 5 [IU] via SUBCUTANEOUS
  Filled 2021-01-02 (×33): qty 1

## 2021-01-02 MED ORDER — ZINC SULFATE 220 (50 ZN) MG PO CAPS
220.0000 mg | ORAL_CAPSULE | Freq: Every day | ORAL | Status: DC
  Administered 2021-01-03 – 2021-01-15 (×13): 220 mg via ORAL
  Filled 2021-01-02 (×13): qty 1

## 2021-01-02 MED ORDER — ASCORBIC ACID 500 MG PO TABS
500.0000 mg | ORAL_TABLET | Freq: Every day | ORAL | Status: DC
  Administered 2021-01-03 – 2021-01-15 (×13): 500 mg via ORAL
  Filled 2021-01-02 (×12): qty 1

## 2021-01-02 MED ORDER — SODIUM CHLORIDE 0.9 % IV SOLN
100.0000 mg | Freq: Every day | INTRAVENOUS | Status: AC
  Administered 2021-01-03 – 2021-01-06 (×4): 100 mg via INTRAVENOUS
  Filled 2021-01-02: qty 20
  Filled 2021-01-02: qty 100
  Filled 2021-01-02: qty 20
  Filled 2021-01-02: qty 100

## 2021-01-02 MED ORDER — GUAIFENESIN-DM 100-10 MG/5ML PO SYRP: 10.0000 mL | ORAL_SOLUTION | ORAL | Status: DC | PRN

## 2021-01-02 MED ORDER — DEXTROSE 50 % IV SOLN
INTRAVENOUS | Status: AC
  Administered 2021-01-02: 25 g via INTRAVENOUS
  Filled 2021-01-02: qty 50

## 2021-01-02 MED ORDER — SODIUM CHLORIDE 0.9 % IV BOLUS
500.0000 mL | Freq: Once | INTRAVENOUS | Status: AC
  Administered 2021-01-02: 500 mL via INTRAVENOUS

## 2021-01-02 MED ORDER — SODIUM CHLORIDE 0.9 % IV BOLUS
1000.0000 mL | Freq: Once | INTRAVENOUS | Status: AC
  Administered 2021-01-02: 1000 mL via INTRAVENOUS

## 2021-01-02 MED ORDER — PREDNISONE 20 MG PO TABS: 50.0000 mg | ORAL_TABLET | Freq: Every day | ORAL | Status: DC

## 2021-01-02 MED ORDER — HEPARIN SODIUM (PORCINE) 5000 UNIT/ML IJ SOLN
5000.0000 [IU] | Freq: Three times a day (TID) | INTRAMUSCULAR | Status: DC
  Administered 2021-01-02 – 2021-01-12 (×29): 5000 [IU] via SUBCUTANEOUS
  Filled 2021-01-02 (×30): qty 1

## 2021-01-02 MED ORDER — METHYLPREDNISOLONE SODIUM SUCC 125 MG IJ SOLR
0.5000 mg/kg | Freq: Two times a day (BID) | INTRAMUSCULAR | Status: DC
Start: 2021-01-02 — End: 2021-01-03
  Administered 2021-01-02: 55 mg via INTRAVENOUS
  Filled 2021-01-02: qty 2

## 2021-01-02 MED ORDER — IPRATROPIUM BROMIDE HFA 17 MCG/ACT IN AERS
2.0000 | INHALATION_SPRAY | Freq: Four times a day (QID) | RESPIRATORY_TRACT | Status: DC
  Administered 2021-01-02 – 2021-01-15 (×42): 2 via RESPIRATORY_TRACT
  Filled 2021-01-02 (×2): qty 12.9

## 2021-01-02 MED ORDER — SODIUM CHLORIDE 0.9 % IV SOLN
200.0000 mg | Freq: Once | INTRAVENOUS | Status: AC
  Administered 2021-01-02: 200 mg via INTRAVENOUS
  Filled 2021-01-02: qty 200

## 2021-01-02 NOTE — ED Provider Notes (Signed)
[facility] Emergency Department Provider Note  ____________________________________________   Event Date/Time   First MD Initiated Contact with Patient 01/02/21 1507     (approximate)  I have reviewed the triage vital signs and the nursing notes.   HISTORY  Chief Complaint Hyperglycemia    HPI Sara Bright is a 80 y.o. female with history of hypertension, CKD, diabetes, OSA on CPAP who comes in with concerns for hyperglycemia and lethargy.  Patient was reportedly on Ozempic for about a month.  Reported that she normally gets the dose every Monday but she did not get her dose this past Monday.  Today they noticed that she was more lethargic than normal and that her sugars were elevated and they gave her some Humalog earlier.  Unclear how much.  Patient continues to be more somnolent per family although she is able to wake up and be alert and oriented x4.  She states that at nighttime she uses CPAP with no concern that they might be using up on settings.  They report that she had some constipation yesterday but had an enema with good output and denies any new abdominal pain.  Unable to get full HPI from patient due to somnolence          Past Medical History:  Diagnosis Date   Adenoma of colon    Adenomatous colon polyp    Anemia    IDA   Cataract    Cervical stenosis of spinal canal    Chronic kidney disease    stage 3   Diabetes mellitus without complication (HCC)    GERD (gastroesophageal reflux disease)    Glaucoma    Heme positive stool    History of UTI    Hx of gallstones    Hyperkalemia    Hyperlipidemia    Hyperlipidemia    Hypertension    Lumbar stenosis    Lymphedema    Neuromuscular disorder ([place])    Neuropathy associated with endocrine disorder ([place])    Obesity    OSA on CPAP    on CPAP   Osteoarthritis    Osteoarthritis    Port-A-Cath in place    Retinopathy due to secondary diabetes ([place])    Secondary  hyperparathyroidism of renal origin [facility])     Patient Active Problem List   Diagnosis Date Noted   Acute idiopathic gout of left foot 06/20/2019   Edema of lower extremity 03/19/2019   Hyperkalemia 03/19/2019   GERD (gastroesophageal reflux disease) 01/20/2019   Hyperlipidemia 01/20/2019   Claudication ([place]) 01/07/2019   Venous ulcer of left leg ([place]) 08/13/2018   Diabetic polyneuropathy associated with type 2 diabetes mellitus ([place]) 05/30/2018   Macrocytosis 04/26/2018   Fecal occult blood test positive 10/08/2017   IDA (iron deficiency anemia) 10/08/2017   Chronic venous insufficiency 12/25/2016   Lymphedema 12/25/2016   Diabetes ([place]) 12/25/2016   Essential hypertension 12/25/2016   Hx of adenomatous colonic polyps 10/22/2016   DOE (dyspnea on exertion) 07/22/2016   CKD (chronic kidney disease) stage 3, GFR 30-59 ml/min (HCC) 06/23/2016   Aortic atherosclerosis ([place]) 03/09/2016   Asthma with chronic obstructive pulmonary disease (COPD) ([place]) 03/09/2016   Swelling of limb 12/31/2015   Severe obesity (BMI >= 40) ([place]) 12/31/2015   Varicose veins of both legs with edema 12/31/2015   VV (varicose veins) 11/27/2015   Other iron deficiency anemia 10/08/2015   Anemia due to stage 3 chronic kidney disease ([place]) 10/08/2015   BMI 45.0-49.9, adult (  [place]) 10/05/2015   OSA on CPAP 03/10/2015   Lumbar stenosis with neurogenic claudication 09/30/2011    Past Surgical History:  Procedure Laterality Date   ACDF X2     BACK SURGERY     Cataract extraction right     catarect extraction left     CHOLECYSTECTOMY     COLONOSCOPY WITH PROPOFOL N/A 12/02/2016   Procedure: COLONOSCOPY WITH PROPOFOL;  Surgeon: Manya Silvas, MD;  Location: [facility] ENDOSCOPY;  Service: Endoscopy;  Laterality: N/A;   COLONOSCOPY WITH PROPOFOL N/A 11/10/2017   Procedure: COLONOSCOPY WITH PROPOFOL;  Surgeon: Toledo, Benay Pike, MD;  Location: [facility] ENDOSCOPY;  Service: Gastroenterology;  Laterality: N/A;   colonoscopy  with removal lesions by snare     Endoscoic carpal tunnel release     ESOPHAGOGASTRODUODENOSCOPY N/A 11/10/2017   Procedure: ESOPHAGOGASTRODUODENOSCOPY (EGD);  Surgeon: Toledo, Benay Pike, MD;  Location: [facility] ENDOSCOPY;  Service: Gastroenterology;  Laterality: N/A;   ESOPHAGOGASTRODUODENOSCOPY (EGD) WITH PROPOFOL N/A 12/02/2016   Procedure: ESOPHAGOGASTRODUODENOSCOPY (EGD) WITH PROPOFOL;  Surgeon: Manya Silvas, MD;  Location: [facility] ENDOSCOPY;  Service: Endoscopy;  Laterality: N/A;   IR IMAGING GUIDED PORT INSERTION  10/01/2017   Laminectomy posterior lumbar facetectomy and formaninotomy w/Decomp     Laminectony posterior cervicle decomp w/Facectomy and foraminotomy     VEIN LIGATION AND STRIPPING Left     Prior to Admission medications   Medication Sig Start Date End Date Taking? Authorizing Provider  allopurinol (ZYLOPRIM) 100 MG tablet Take 100 mg by mouth daily.   Yes [provider]  amLODipine (NORVASC) 10 MG tablet Take 10 mg by mouth daily. 06/14/19  Yes [provider]  aspirin EC 81 MG tablet Take 81 mg by mouth daily.   Yes [provider]  bisacodyl (FLEET) 10 MG/30ML ENEM Place 10 mg rectally once.   Yes [provider]  carvedilol (COREG) 12.5 MG tablet Take 12.5 mg by mouth 2 (two) times daily. 05/06/16  Yes [provider]  Dulaglutide (TRULICITY) 9.67 EL/3.8BO SOPN Inject 0.75 mg into the skin once a week. Wednesday   Yes [provider]  ferrous sulfate 325 (65 FE) MG tablet Take 325 mg by mouth daily.   Yes [provider]  furosemide (LASIX) 40 MG tablet Take 40 mg by mouth daily.  08/20/15  Yes [provider]  gabapentin (NEURONTIN) 100 MG capsule Take 100 mg by mouth 2 (two) times daily.  07/15/15  Yes [provider]  gemfibrozil (LOPID) 600 MG tablet Take 600 mg by mouth daily.  08/09/15  Yes [provider]  latanoprost (XALATAN) 0.005 % ophthalmic solution Place 1 drop into both eyes.   08/09/15  Yes [provider]  losartan (COZAAR) 100 MG tablet Take 100 mg by mouth daily. 08/13/20  Yes [provider]  Multiple Vitamins-Minerals (CENTRUM SILVER) tablet Take 1 tablet by mouth daily.  04/30/10  Yes [provider]  Omega-3 Fatty Acids (FISH OIL) 1000 MG CAPS Take 1,000 mg by mouth daily.  04/30/10  Yes [provider]  omeprazole (PRILOSEC) 20 MG capsule Take 20 mg by mouth daily.  08/19/15  Yes [provider]  polyethylene glycol (MIRALAX / GLYCOLAX) 17 g packet Take 17 g by mouth daily.   Yes [provider]  Semaglutide,0.25 or 0.5MG /DOS, (OZEMPIC, 0.25 OR 0.5 MG/DOSE,) 2 MG/1.5ML SOPN Inject 0.25 mg into the skin once a week.   Yes [provider]  senna (SENOKOT) 8.6 MG TABS tablet Take 1 tablet by  mouth at bedtime.   Yes [provider]  acetaminophen (TYLENOL) 500 MG tablet Take 500 mg by mouth every 6 (six) hours as needed.     [provider]  albuterol (PROVENTIL HFA;VENTOLIN HFA) 108 (90 Base) MCG/ACT inhaler Inhale 2 puffs into the lungs every 6 (six) hours as needed for wheezing or shortness of breath.    [provider]  ASSURE COMFORT LANCETS 30G MISC  07/22/16   [provider]  calcitRIOL (ROCALTROL) 0.25 MCG capsule Take 0.5 mcg by mouth 3 (three) times a week. Patient not taking: Reported on 01/02/2021 09/24/15   [provider]  glimepiride (AMARYL) 1 MG tablet Take 0.5 mg by mouth daily with breakfast. Patient not taking: Reported on 01/02/2021 08/20/15   [provider]  hydrALAZINE (APRESOLINE) 50 MG tablet Take 25 mg by mouth 3 (three) times daily.  Patient not taking: Reported on 01/02/2021 11/29/17   [provider]  HYDROcodone-acetaminophen (NORCO/VICODIN) 5-325 MG tablet Take 1 tablet by mouth every 8 (eight) hours as needed. Patient not taking: Reported on 01/02/2021 12/09/20   Coral Spikes, DO  Lancet Devices (ADJUSTABLE LANCING  DEVICE) [place]  07/22/16   [provider]  lidocaine-prilocaine (EMLA) cream Apply 1 application topically as needed. 09/30/20   Earlie Server, MD  ondansetron (ZOFRAN) 4 MG tablet Take 1 tablet (4 mg total) by mouth every 8 (eight) hours as needed for nausea or vomiting. 12/09/20   Coral Spikes, DO  ONE TOUCH ULTRA TEST test strip  07/22/16   [provider]  pioglitazone (ACTOS) 30 MG tablet Take 30 mg by mouth daily.  Patient not taking: Reported on 01/02/2021 05/21/15 06/22/37  [provider]    Allergies Aspirin, Hydrocodone-acetaminophen, Other, Statins, and Tramadol-acetaminophen  Family History  Problem Relation Age of Onset   Coronary artery disease Mother    Diabetes type II Mother    Hypertension Mother    Tuberculosis Father    Stroke Father    Diabetes type II Sister    Migraines Sister    Alcohol abuse Brother    Coronary artery disease Brother    Diabetes type II Brother    Kidney cancer Brother    Kidney disease Brother    Heart attack Brother    Hypertension Brother     Social History Social History   Tobacco Use   Smoking status: Never   Smokeless tobacco: Never  Vaping Use   Vaping Use: Never used  Substance Use Topics   Alcohol use: No   Drug use: No      Review of Systems Unable to get full review of system from patient due to somnolence _____________________   PHYSICAL EXAM:  VITAL SIGNS: ED Triage Vitals  Enc Vitals Group     BP --      Pulse Rate 01/02/21 1453 68     Resp 01/02/21 1453 (!) 22     Temp 01/02/21 1453 98.3 F (36.8 C)     Temp src --      SpO2 01/02/21 1453 97 %     Weight 01/02/21 1451 243 lb 3.2 oz (110.3 kg)     Height --      Head Circumference --      Peak Flow --      Pain Score --      Pain Loc --      Pain Edu? --      Excl. in [place]? --  Constitutional: Alert and oriented x4 but patient is sleepy and slightly difficult to arouse at first Eyes: Conjunctivae are normal. EOMI. Head:  Atraumatic. Nose: No congestion/rhinnorhea. Mouth/Throat: Mucous membranes are moist.   Neck: No stridor. Trachea Midline. FROM Cardiovascular: Normal rate, regular rhythm. Grossly normal heart sounds.  Good peripheral circulation. Respiratory: Normal respiratory effort.  No retractions. Lungs CTAB. Gastrointestinal: Soft and nontender. No distention. No abdominal bruits.  Musculoskeletal: No lower extremity tenderness nor edema.  No joint effusions. Neurologic:  Normal speech and language. No gross focal neurologic deficits are appreciated.  Good equal squeeze in her hands.  Unable to lift up either leg from the bed.  Patient reports that she has been nonambulatory since hospitalization Skin:  Skin is warm, dry and intact. No rash noted. Psychiatric: Mood and affect are normal. Speech and behavior are normal.  Patient appears tired GU: Deferred   ____________________________________________   LABS (all labs ordered are listed, but only abnormal results are displayed)  Labs Reviewed  BASIC METABOLIC PANEL - Abnormal; Notable for the following components:      Result Value   Potassium 3.2 (*)    Chloride 94 (*)    Glucose, Bld 330 (*)    BUN 64 (*)    Creatinine, Ser 2.85 (*)    Calcium 11.4 (*)    GFR, Estimated 16 (*)    All other components within normal limits  CBC - Abnormal; Notable for the following components:   RBC 2.47 (*)    Hemoglobin 8.3 (*)    HCT 25.5 (*)    MCV 103.2 (*)    All other components within normal limits  BETA-HYDROXYBUTYRIC ACID - Abnormal; Notable for the following components:   Beta-Hydroxybutyric Acid <0.05 (*)    All other components within normal limits  HEPATIC FUNCTION PANEL - Abnormal; Notable for the following components:   Albumin 2.5 (*)    All other components within normal limits  CBG MONITORING, ED - Abnormal; Notable for the following components:   Glucose-Capillary 321 (*)    All other components within normal limits  TROPONIN I  (HIGH SENSITIVITY) - Abnormal; Notable for the following components:   Troponin I (High Sensitivity) 49 (*)    All other components within normal limits  RESP PANEL BY RT-PCR (FLU A&B, COVID) ARPGX2  URINALYSIS, ROUTINE W REFLEX MICROSCOPIC  BLOOD GAS, VENOUS  CBG MONITORING, ED   ____________________________________________   ED ECG REPORT I, Vanessa [place], the attending physician, personally viewed and interpreted this ECG.  Normal sinus rate 64, no ST elevation, no T wave versions, normal intervals bowels ____________________________________________  RADIOLOGY  Official radiology report(s): CT HEAD WO CONTRAST (5MM)  Result Date: 01/02/2021 CLINICAL DATA:  Dizziness EXAM: CT HEAD WITHOUT CONTRAST TECHNIQUE: Contiguous axial images were obtained from the base of the skull through the vertex without intravenous contrast. COMPARISON:  01/12/2010 FINDINGS: Brain: No evidence of acute infarction, hemorrhage, hydrocephalus, extra-axial collection or mass lesion/mass effect. Vascular: Atherosclerotic calcifications involving the large vessels of the skull base. No unexpected hyperdense vessel. Skull: Normal. Negative for fracture or focal lesion. Sinuses/Orbits: No acute finding. Other: None. IMPRESSION: No acute intracranial findings. Electronically Signed   By: Davina Poke D.O.   On: 01/02/2021 16:20    ____________________________________________   PROCEDURES  Procedure(s) performed (including Critical Care):  Procedures   ____________________________________________   INITIAL IMPRESSION / ASSESSMENT AND PLAN / ED COURSE  Sara Bright was evaluated in Emergency Department on 01/02/2021 for the symptoms described  in the history of present illness. She was evaluated in the context of the global COVID-19 pandemic, which necessitated consideration that the patient might be at risk for infection with the SARS-CoV-2 virus that causes COVID-19. Institutional protocols and  algorithms that pertain to the evaluation of patients at risk for COVID-19 are in a state of rapid change based on information released by regulatory bodies including the CDC and federal and state organizations. These policies and algorithms were followed during the patient's care in the ED.    Patient comes in with concerns for somnolence and elevated sugars.  According to daughter is at bedside she is never been somnolent before with elevated sugars.  May just be an incidental finding.  Will get labs to evaluate for other causes for altered mental status including liver dysfunction, elevated CO2, UTI, electrolyte abnormalities.  Will get CT head evaluate for any intracranial hemorrhage, mass.  Patient is able to wake up and squeezes both her bilateral hands equally and is alert and oriented x4 so less likely is stroke.  Patient has new AKI.  On review of records 10 days ago at [facility] her creatinine was 1.3.  Her calcium was also slightly elevated today but similar to her recent discharge at 11.2 and patient was taken off her calcitriol.  CT head was negative.  We will get bladder scan given patient did have an issue with retention at [facility] and get a urine sample to evaluate for UTI  Patient is on Lasix and I do not see any echo in our system however there is no edema in her legs and when I was in the room her blood pressures were in the 80s over 50s therefore patient was given 1 L of fluid  Reevaluated patient.  Patient's had some intermittently low blood pressures patient is responding to fluids.  Still on her liter of fluid.  Cording to daughter at bedside she seems to be a little bit more awake than previous does have little bit of slurred speech but daughter states that she had this previously when she had a UTI.  Urine does look consistent with UTI.  Will send for culture.  We will give a dose of ceftriaxone.  Patient is otherwise does not appear septic.  Given the new AKI from discharge, UTI and slurred  speech and lethargy we will discussed the hospital team for admission.  Sugars have come down immensely but patient was given some insulin at the facility which is probably contributing         ____________________________________________   FINAL CLINICAL IMPRESSION(S) / ED DIAGNOSES   Final diagnoses:  Hyperglycemia  Urinary tract infection without hematuria, site unspecified  Lethargy  AKI (acute kidney injury) ([place])      MEDICATIONS GIVEN DURING THIS VISIT:  Medications  sodium chloride 0.9 % bolus 1,000 mL (1,000 mLs Intravenous New Bag/Given 01/02/21 1556)     ED Discharge Orders     None        Note:  This document was prepared using Dragon voice recognition software and may include unintentional dictation errors.    Vanessa Carver, MD 01/02/21 [place]

## 2021-01-02 NOTE — ED Notes (Signed)
Pt brief changed. Warm blanket provided. BM noted

## 2021-01-02 NOTE — ED Notes (Signed)
Pt to MRI

## 2021-01-02 NOTE — ED Notes (Signed)
Bladder scan 102

## 2021-01-02 NOTE — ED Notes (Signed)
Date and time results received: 01/02/21 1928  (use smartphrase ".now" to insert current time)  Test: covid Critical Value: +  Name of Provider Notified: Dr. Jari Pigg  Orders Received? Or Actions Taken?:

## 2021-01-02 NOTE — Progress Notes (Signed)
Remdesivir - Pharmacy Brief Note   O:  ALT: pending CXR: Low lung volumes. Cardiomegaly with probable central vascular congestion. SpO2: 94-98% on room air   A/P:  Remdesivir 200 mg IVPB once followed by 100 mg IVPB daily x 4 days.   Tawnya Crook, PharmD, BCPS Clinical Pharmacist 01/02/2021 9:54 PM

## 2021-01-02 NOTE — H&P (Signed)
History and Physical    Sara Bright RUE:[id number] DOB: November 04, 1940 DOA: 01/02/2021  PCP: Anthonette Legato, MD    Patient coming from:  Facility,.    Chief Complaint:  AMS   HPI: Sara Bright is a 80 y.o. female seen in ed with complaints of  Hpi is per chart review and pt is somnolent and unresponsive moved to temerity until with cont cardiac monitoring. Pt was hyperglycemic was given insulin and now her sugars are in the 88's. Pt was noted to be more lethargic by family members ad well.   Pt has past medical history of polyp-adenomatous, anemia, cataracts, chronic kidney disease stage III, diabetes mellitus type 2, GERD, glaucoma, guaiac positive stools, hyperlipidemia, hypertension, OSA, obesity.  ED Course:  Vitals:   01/02/21 1919 01/02/21 1930 01/02/21 2000 01/02/21 2030  BP: (!) 107/55 (!) 104/52 (!) 91/44 (!) 94/45  Pulse: 63 67 60 (!) 57  Resp: 16 17 16 17   Temp:      SpO2: 97% 99% 94% 99%  Weight:      In the emergency room patient has symptomatic somnolent and lethargic spontaneous eye opening.  Unable to answer questions.  Patient positive for COVID, also found to have anemia of chronic disease with hemoglobin of 8.3, no thrombocytopenia, therefore hold his antiplatelets with risk.  Patient denies chest pains or 7.4 PO2 of 49 PCO2 45. CBC today shows hemoglobin of 8.3, white count of 8.6, MCV of 103.2, RDW of 190.  Urinalysis shows large leukocyte and nitrite negative with blood 21-50 RBCs and 1-50 WBCs present is concerning for urinary tract infection and Normal saline 1 L bolus and Rocephin for her UTI.  Review of Systems:  Review of Systems  Unable to perform ROS: Mental status change  Confused..  Past Medical History:  Diagnosis Date   Adenoma of colon    Adenomatous colon polyp    Anemia    IDA   Cataract    Cervical stenosis of spinal canal    Chronic kidney disease    stage 3   Diabetes mellitus without complication (HCC)    GERD  (gastroesophageal reflux disease)    Glaucoma    Heme positive stool    History of UTI    Hx of gallstones    Hyperkalemia    Hyperlipidemia    Hyperlipidemia    Hypertension    Lumbar stenosis    Lymphedema    Neuromuscular disorder (HCC)    Neuropathy associated with endocrine disorder (HCC)    Obesity    OSA on CPAP    on CPAP   Osteoarthritis    Osteoarthritis    Port-A-Cath in place    Retinopathy due to secondary diabetes ([place])    Secondary hyperparathyroidism of renal origin [facility])     Past Surgical History:  Procedure Laterality Date   ACDF X2     BACK SURGERY     Cataract extraction right     catarect extraction left     CHOLECYSTECTOMY     COLONOSCOPY WITH PROPOFOL N/A 12/02/2016   Procedure: COLONOSCOPY WITH PROPOFOL;  Surgeon: Manya Silvas, MD;  Location: [facility] ENDOSCOPY;  Service: Endoscopy;  Laterality: N/A;   COLONOSCOPY WITH PROPOFOL N/A 11/10/2017   Procedure: COLONOSCOPY WITH PROPOFOL;  Surgeon: Toledo, Benay Pike, MD;  Location: [facility] ENDOSCOPY;  Service: Gastroenterology;  Laterality: N/A;   colonoscopy with removal lesions by snare     Endoscoic carpal tunnel release     ESOPHAGOGASTRODUODENOSCOPY N/A 11/10/2017  Procedure: ESOPHAGOGASTRODUODENOSCOPY (EGD);  Surgeon: Toledo, Benay Pike, MD;  Location: [facility] ENDOSCOPY;  Service: Gastroenterology;  Laterality: N/A;   ESOPHAGOGASTRODUODENOSCOPY (EGD) WITH PROPOFOL N/A 12/02/2016   Procedure: ESOPHAGOGASTRODUODENOSCOPY (EGD) WITH PROPOFOL;  Surgeon: Manya Silvas, MD;  Location: [facility] ENDOSCOPY;  Service: Endoscopy;  Laterality: N/A;   IR IMAGING GUIDED PORT INSERTION  10/01/2017   Laminectomy posterior lumbar facetectomy and formaninotomy w/Decomp     Laminectony posterior cervicle decomp w/Facectomy and foraminotomy     VEIN LIGATION AND STRIPPING Left      reports that she has never smoked. She has never used smokeless tobacco. She reports that she does not drink alcohol and does not use  drugs.  Allergies  Allergen Reactions   Aspirin     Other reaction(s): Other (see comments)   Hydrocodone-Acetaminophen Other (See Comments)    Sedation and GI upset, pt is not allergic to acetaminophen   Other Other (See Comments)   Statins Other (See Comments)   Tramadol-Acetaminophen Nausea And Vomiting    GI upset, pt is not allergic to acetaminophen    Family History  Problem Relation Age of Onset   Coronary artery disease Mother    Diabetes type II Mother    Hypertension Mother    Tuberculosis Father    Stroke Father    Diabetes type II Sister    Migraines Sister    Alcohol abuse Brother    Coronary artery disease Brother    Diabetes type II Brother    Kidney cancer Brother    Kidney disease Brother    Heart attack Brother    Hypertension Brother     Prior to Admission medications   Medication Sig Start Date End Date Taking? Authorizing Provider  allopurinol (ZYLOPRIM) 100 MG tablet Take 100 mg by mouth daily.   Yes [provider]  amLODipine (NORVASC) 10 MG tablet Take 10 mg by mouth daily. 06/14/19  Yes [provider]  aspirin EC 81 MG tablet Take 81 mg by mouth daily.   Yes [provider]  bisacodyl (FLEET) 10 MG/30ML ENEM Place 10 mg rectally once.   Yes [provider]  carvedilol (COREG) 12.5 MG tablet Take 12.5 mg by mouth 2 (two) times daily. 05/06/16  Yes [provider]  Dulaglutide (TRULICITY) 1.77 LT/9.0ZE SOPN Inject 0.75 mg into the skin once a week. Wednesday   Yes [provider]  ferrous sulfate 325 (65 FE) MG tablet Take 325 mg by mouth daily.   Yes [provider]  furosemide (LASIX) 40 MG tablet Take 40 mg by mouth daily.  08/20/15  Yes [provider]  gabapentin (NEURONTIN) 100 MG capsule Take 100 mg by mouth 2 (two) times daily.  07/15/15  Yes [provider]  gemfibrozil (LOPID) 600 MG tablet Take 600 mg by mouth daily.  08/09/15  Yes [provider]   latanoprost (XALATAN) 0.005 % ophthalmic solution Place 1 drop into both eyes.  08/09/15  Yes [provider]  losartan (COZAAR) 100 MG tablet Take 100 mg by mouth daily. 08/13/20  Yes [provider]  Multiple Vitamins-Minerals (CENTRUM SILVER) tablet Take 1 tablet by mouth daily.  04/30/10  Yes [provider]  Omega-3 Fatty Acids (FISH OIL) 1000 MG CAPS Take 1,000 mg by mouth daily.  04/30/10  Yes [provider]  omeprazole (PRILOSEC) 20 MG capsule Take 20 mg by mouth daily.  08/19/15  Yes [provider]  polyethylene glycol (MIRALAX / GLYCOLAX) 17 g packet Take  17 g by mouth daily.   Yes [provider]  Semaglutide,0.25 or 0.5MG /DOS, (OZEMPIC, 0.25 OR 0.5 MG/DOSE,) 2 MG/1.5ML SOPN Inject 0.25 mg into the skin once a week.   Yes [provider]  senna (SENOKOT) 8.6 MG TABS tablet Take 1 tablet by mouth at bedtime.   Yes [provider]  acetaminophen (TYLENOL) 500 MG tablet Take 500 mg by mouth every 6 (six) hours as needed.     [provider]  albuterol (PROVENTIL HFA;VENTOLIN HFA) 108 (90 Base) MCG/ACT inhaler Inhale 2 puffs into the lungs every 6 (six) hours as needed for wheezing or shortness of breath.    [provider]  ASSURE COMFORT LANCETS 30G MISC  07/22/16   [provider]  calcitRIOL (ROCALTROL) 0.25 MCG capsule Take 0.5 mcg by mouth 3 (three) times a week. Patient not taking: Reported on 01/02/2021 09/24/15   [provider]  glimepiride (AMARYL) 1 MG tablet Take 0.5 mg by mouth daily with breakfast. Patient not taking: Reported on 01/02/2021 08/20/15   [provider]  hydrALAZINE (APRESOLINE) 50 MG tablet Take 25 mg by mouth 3 (three) times daily.  Patient not taking: Reported on 01/02/2021 11/29/17   [provider]  HYDROcodone-acetaminophen (NORCO/VICODIN) 5-325 MG tablet Take 1 tablet by mouth every 8 (eight) hours as needed. Patient not taking: Reported  on 01/02/2021 12/09/20   Coral Spikes, DO  Lancet Devices (ADJUSTABLE LANCING DEVICE) [place]  07/22/16   [provider]  lidocaine-prilocaine (EMLA) cream Apply 1 application topically as needed. 09/30/20   Earlie Server, MD  ondansetron (ZOFRAN) 4 MG tablet Take 1 tablet (4 mg total) by mouth every 8 (eight) hours as needed for nausea or vomiting. 12/09/20   Coral Spikes, DO  ONE TOUCH ULTRA TEST test strip  07/22/16   [provider]  pioglitazone (ACTOS) 30 MG tablet Take 30 mg by mouth daily.  Patient not taking: Reported on 01/02/2021 05/21/15 06/22/37  [provider]    Physical Exam: Vitals:   01/02/21 1919 01/02/21 1930 01/02/21 2000 01/02/21 2030  BP: (!) 107/55 (!) 104/52 (!) 91/44 (!) 94/45  Pulse: 63 67 60 (!) 57  Resp: 16 17 16 17   Temp:      SpO2: 97% 99% 94% 99%  Weight:       Physical Exam Vitals and nursing note reviewed.  Constitutional:      Appearance: Normal appearance. She is normal weight.  HENT:     Head: Normocephalic and atraumatic.     Right Ear: External ear normal.     Left Ear: External ear normal.     Nose: Nose normal.     Mouth/Throat:     Mouth: Mucous membranes are moist.  Eyes:     Extraocular Movements: Extraocular movements intact.     Pupils: Pupils are equal, round, and reactive to light.  Neck:     Vascular: No carotid bruit.  Cardiovascular:     Rate and Rhythm: Normal rate and regular rhythm.     Pulses: Normal pulses.     Heart sounds: Normal heart sounds.  Pulmonary:     Effort: Pulmonary effort is normal.     Breath sounds: Normal breath sounds.  Abdominal:     General: Bowel sounds are normal. There is no distension.     Palpations: Abdomen is soft. There is no mass.     Tenderness: There is no abdominal tenderness. There is no guarding.  Hernia: No hernia is present.  Musculoskeletal:     Right lower leg: No edema.     Left lower leg: No edema.  Skin:    General: Skin is warm.  Neurological:      General: No focal deficit present.     Mental Status: She is alert. She is disoriented.  Psychiatric:        Mood and Affect: Affect is flat.    Labs on Admission: I have personally reviewed following labs and imaging studies  No results for input(s): CKTOTAL, CKMB, TROPONINI in the last 72 hours. Lab Results  Component Value Date   WBC 8.6 01/02/2021   HGB 8.3 (L) 01/02/2021   HCT 25.5 (L) 01/02/2021   MCV 103.2 (H) 01/02/2021   PLT 190 01/02/2021    Recent Labs  Lab 01/02/21 1504  NA 136  K 3.2*  CL 94*  CO2 28  BUN 64*  CREATININE 2.85*  CALCIUM 11.4*  PROT 6.8  BILITOT 0.7  ALKPHOS 55  ALT 15  AST 20  GLUCOSE 330*   No results found for: CHOL, HDL, LDLCALC, TRIG No results found for: DDIMER Invalid input(s): POCBNP  Urinalysis    Component Value Date/Time   COLORURINE YELLOW (A) 01/02/2021 1759   APPEARANCEUR TURBID (A) 01/02/2021 1759   APPEARANCEUR Cloudy 10/23/2012 2334   LABSPEC 1.014 01/02/2021 1759   LABSPEC 1.012 10/23/2012 2334   PHURINE 5.0 01/02/2021 1759   GLUCOSEU >=500 (A) 01/02/2021 1759   GLUCOSEU Negative 10/23/2012 2334   HGBUR SMALL (A) 01/02/2021 1759   BILIRUBINUR NEGATIVE 01/02/2021 1759   BILIRUBINUR Negative 10/23/2012 [place] 01/02/2021 1759   PROTEINUR 100 (A) 01/02/2021 1759   NITRITE NEGATIVE 01/02/2021 1759   LEUKOCYTESUR LARGE (A) 01/02/2021 1759   LEUKOCYTESUR 3+ 10/23/2012 2334    COVID-19 Labs No results for input(s): DDIMER, FERRITIN, LDH, CRP in the last 72 hours.  Lab Results  Component Value Date   SARSCOV2NAA POSITIVE (A) 01/02/2021    Radiological Exams on Admission: CT HEAD WO CONTRAST (5MM)  Result Date: 01/02/2021 CLINICAL DATA:  Dizziness EXAM: CT HEAD WITHOUT CONTRAST TECHNIQUE: Contiguous axial images were obtained from the base of the skull through the vertex without intravenous contrast. COMPARISON:  01/12/2010 FINDINGS: Brain: No evidence of acute infarction, hemorrhage,  hydrocephalus, extra-axial collection or mass lesion/mass effect. Vascular: Atherosclerotic calcifications involving the large vessels of the skull base. No unexpected hyperdense vessel. Skull: Normal. Negative for fracture or focal lesion. Sinuses/Orbits: No acute finding. Other: None. IMPRESSION: No acute intracranial findings. Electronically Signed   By: Davina Poke D.O.   On: 01/02/2021 16:20   DG Chest Portable 1 View  Result Date: 01/02/2021 CLINICAL DATA:  Shortness of breath EXAM: PORTABLE CHEST 1 VIEW COMPARISON:  01/12/2010, CT chest 09/01/2016, chest x-ray 07/23/2020 report FINDINGS: Right-sided central venous port tip over the cavoatrial region. Low lung volumes. Cardiomegaly with central bronchovascular crowding but suspect a degree of vascular congestion. No pleural effusion or pneumothorax. IMPRESSION: Low lung volumes. Cardiomegaly with probable central vascular congestion. Electronically Signed   By: Donavan Foil M.D.   On: 01/02/2021 17:28    EKG: Independently reviewed.  Pending   Assessment/Plan Principal Problem:   AMS (altered mental status) Active Problems:   Pneumonia due to COVID-19 virus   Acute respiratory failure with hypoxia (HCC)   Diabetes ([place])   Essential hypertension   Anemia due to stage 3 chronic kidney disease (HCC)   IDA (iron deficiency anemia)  GERD (gastroesophageal reflux disease)   Altered mental status: Differentials include encephalopathy from COVID, patient also has urinary tract infection to contribute to this.  Neurochecks, supportive care with aspiration precautions, telemetry monitoring.  Oxygen monitoring.  Thyroid function studies, will also obtain an MRI as patient has a history of hypertension and TIA and strokes can be 1 differential in setting of COVID.  Acute respiratory failure with hypoxia/COVID-pneumonia: Will start patient on supportive care with [place] supplemental oxygen, goal sats 92% and above. Will start patient on  remdesivir therapy along with steroid therapy as patient is hypoxic , COVID isolation with contact airborne and droplet precautions.  Diabetes mellitus type 2: We will continue patient on sliding scale and glycemic protocol, hold patient's home regimen of Actos, Ozempic, Amaryl, Trulicity, to avoid hypoglycemia.   Hypertension: Blood pressure (!) 94/45, pulse (!) 57, temperature 98.3 F (36.8 C), resp. rate 17, weight 110.3 kg, SpO2 99 %. Blood pressure is low and patient is bradycardic, patient's been given normal saline IV fluid hydration bolus. We will hold patient's home regimen with amlodipine, carvedilol, Lasix, hydralazine, losartan, we will monitor patient's blood pressure and start as needed hydralazine and resume blood pressure medications as needed.  Anemia due to stage III of chronic kidney disease: Results for GENNIFER, POTENZA (MRN [id number]) as of 01/02/2021 20:30  Ref. Range 09/05/2020 09:36 09/19/2020 13:44 10/03/2020 10:38 10/15/2020 10:30 11/14/2020 10:02 01/02/2021 15:04  Hemoglobin Latest Ref Range: 12.0 - 15.0 g/dL 8.4 (L) 8.7 (L) 8.7 (L) 9.5 (L) 9.3 (L) 8.3 (L)   Lab Results  Component Value Date   CREATININE 2.85 (H) 01/02/2021   CREATININE 2.18 (H) 06/20/2020   CREATININE 1.89 (H) 03/28/2020  Hemoglobin is stable, and creatinine has acute on chronic worsening. Will admit patient and hydrate with maintenance fluids at a low rate to prevent heart failure. CBC shows anemia with a hemoglobin of 8.3 previously 9.3, 9.5.  Iron deficiency anemia/GERD: Hemoglobin of 8.3 today, past few hemoglobins have been 8.5 at 9. Type and screen, Protonix, transfuse if indicated.  DVT prophylaxis:  Heparin    Code Status:  Full code    Family Communication:  Holmes,Charlotte (Daughter)  [phone] (Mobile)   Disposition Plan:  Home / STR.    Consults called:  None  Admission status: Inpatient.      Para Skeans MD [facility] [phone] How to contact  the [facility] Attending or Consulting provider [place] or covering provider during after hours [place], for this patient.    Check the care team in [facility] and look for a) attending/consulting [facility] provider listed and b) the [facility] team listed Log into [email or web address] and use [place]'s universal password to access. If you do not have the password, please contact the hospital operator. Locate the [facility] provider you are looking for under [facility] and page to a number that you can be directly reached. If you still have difficulty reaching the provider, please page the [facility] (Director on Call) for the Hospitalists listed on amion for assistance. [email or web address] Password [facility] 01/02/2021, 8:48 PM

## 2021-01-02 NOTE — ED Triage Notes (Signed)
[facility] for hyperglycemia and lethargy. Reports was given humalog earlier per sliding scale. Faciliity was out of ozempic, therfore has not had in a few days.,  Pt responsive to verbal stimuli. Oriented to person only. NAD noted

## 2021-01-02 NOTE — ED Notes (Signed)
In and out performed with Levada Dy RN

## 2021-01-03 DIAGNOSIS — G9341 Metabolic encephalopathy: Secondary | ICD-10-CM

## 2021-01-03 DIAGNOSIS — N3001 Acute cystitis with hematuria: Secondary | ICD-10-CM | POA: Diagnosis not present

## 2021-01-03 DIAGNOSIS — N189 Chronic kidney disease, unspecified: Secondary | ICD-10-CM | POA: Insufficient documentation

## 2021-01-03 DIAGNOSIS — N179 Acute kidney failure, unspecified: Secondary | ICD-10-CM

## 2021-01-03 DIAGNOSIS — D509 Iron deficiency anemia, unspecified: Secondary | ICD-10-CM

## 2021-01-03 DIAGNOSIS — U071 COVID-19: Secondary | ICD-10-CM | POA: Diagnosis not present

## 2021-01-03 LAB — COMPREHENSIVE METABOLIC PANEL
ALT: 16 U/L (ref 0–44)
AST: 28 U/L (ref 15–41)
Albumin: 2.6 g/dL — ABNORMAL LOW (ref 3.5–5.0)
Alkaline Phosphatase: 58 U/L (ref 38–126)
Anion gap: 16 — ABNORMAL HIGH (ref 5–15)
BUN: 64 mg/dL — ABNORMAL HIGH (ref 8–23)
CO2: 24 mmol/L (ref 22–32)
Calcium: 11.1 mg/dL — ABNORMAL HIGH (ref 8.9–10.3)
Chloride: 99 mmol/L (ref 98–111)
Creatinine, Ser: 2.75 mg/dL — ABNORMAL HIGH (ref 0.44–1.00)
GFR, Estimated: 17 mL/min — ABNORMAL LOW (ref >60)
Glucose, Bld: 307 mg/dL — ABNORMAL HIGH (ref 70–99)
Potassium: 4.7 mmol/L (ref 3.5–5.1)
Sodium: 139 mmol/L (ref 135–145)
Total Bilirubin: 1.1 mg/dL (ref 0.3–1.2)
Total Protein: 6.9 g/dL (ref 6.5–8.1)

## 2021-01-03 LAB — CBG MONITORING, ED
Glucose-Capillary: 259 mg/dL — ABNORMAL HIGH (ref 70–99)
Glucose-Capillary: 307 mg/dL — ABNORMAL HIGH (ref 70–99)
Glucose-Capillary: 381 mg/dL — ABNORMAL HIGH (ref 70–99)
Glucose-Capillary: 404 mg/dL — ABNORMAL HIGH (ref 70–99)
Glucose-Capillary: 303 mg/dL — ABNORMAL HIGH (ref 70–99)
Glucose-Capillary: 256 mg/dL — ABNORMAL HIGH (ref 70–99)

## 2021-01-03 LAB — GLUCOSE, CAPILLARY: Glucose-Capillary: 223 mg/dL — ABNORMAL HIGH (ref 70–99)

## 2021-01-03 LAB — VITAMIN D 25 HYDROXY (VIT D DEFICIENCY, FRACTURES): Vit D, 25-Hydroxy: 29.3 ng/mL — ABNORMAL LOW (ref 30–100)

## 2021-01-03 LAB — PHOSPHORUS: Phosphorus: 4.2 mg/dL (ref 2.5–4.6)

## 2021-01-03 LAB — HEMOGLOBIN A1C
Hgb A1c MFr Bld: 9 % — ABNORMAL HIGH (ref 4.8–5.6)
Mean Plasma Glucose: 211.6 mg/dL

## 2021-01-03 MED ORDER — ADULT MULTIVITAMIN W/MINERALS CH
1.0000 | ORAL_TABLET | Freq: Every day | ORAL | Status: DC
  Administered 2021-01-03 – 2021-01-15 (×13): 1 via ORAL
  Filled 2021-01-03 (×12): qty 1

## 2021-01-03 MED ORDER — SODIUM CHLORIDE 0.9 % IV SOLN
1.0000 g | INTRAVENOUS | Status: DC
  Administered 2021-01-03: 1 g via INTRAVENOUS
  Filled 2021-01-03 (×2): qty 10

## 2021-01-03 MED ORDER — INSULIN ASPART 100 UNIT/ML IJ SOLN
3.0000 [IU] | Freq: Three times a day (TID) | INTRAMUSCULAR | Status: DC
  Administered 2021-01-03 – 2021-01-07 (×11): 3 [IU] via SUBCUTANEOUS
  Filled 2021-01-03 (×11): qty 1

## 2021-01-03 MED ORDER — ONDANSETRON HCL 4 MG PO TABS
4.0000 mg | ORAL_TABLET | Freq: Three times a day (TID) | ORAL | Status: DC | PRN
  Administered 2021-01-03: 4 mg via ORAL
  Filled 2021-01-03: qty 1

## 2021-01-03 MED ORDER — LATANOPROST 0.005 % OP SOLN
1.0000 [drp] | Freq: Every day | OPHTHALMIC | Status: DC
  Administered 2021-01-03 – 2021-01-14 (×12): 1 [drp] via OPHTHALMIC
  Filled 2021-01-03: qty 2.5

## 2021-01-03 MED ORDER — SODIUM CHLORIDE 0.9 % IV SOLN: Freq: Once | INTRAVENOUS | Status: AC

## 2021-01-03 MED ORDER — ALBUTEROL SULFATE HFA 108 (90 BASE) MCG/ACT IN AERS
2.0000 | INHALATION_SPRAY | Freq: Four times a day (QID) | RESPIRATORY_TRACT | Status: DC | PRN
  Filled 2021-01-03: qty 6.7

## 2021-01-03 MED ORDER — ASPIRIN EC 81 MG PO TBEC
81.0000 mg | DELAYED_RELEASE_TABLET | Freq: Every day | ORAL | Status: DC
  Administered 2021-01-03 – 2021-01-15 (×13): 81 mg via ORAL
  Filled 2021-01-03 (×13): qty 1

## 2021-01-03 MED ORDER — FERROUS SULFATE 325 (65 FE) MG PO TABS
325.0000 mg | ORAL_TABLET | Freq: Every day | ORAL | Status: DC
  Administered 2021-01-03 – 2021-01-14 (×12): 325 mg via ORAL
  Filled 2021-01-03 (×13): qty 1

## 2021-01-03 MED ORDER — GEMFIBROZIL 600 MG PO TABS
600.0000 mg | ORAL_TABLET | Freq: Every day | ORAL | Status: DC
  Administered 2021-01-03 – 2021-01-15 (×13): 600 mg via ORAL
  Filled 2021-01-03 (×13): qty 1

## 2021-01-03 MED ORDER — ALLOPURINOL 100 MG PO TABS
100.0000 mg | ORAL_TABLET | Freq: Every day | ORAL | Status: DC
  Administered 2021-01-03 – 2021-01-15 (×13): 100 mg via ORAL
  Filled 2021-01-03 (×13): qty 1

## 2021-01-03 MED ORDER — POLYETHYLENE GLYCOL 3350 17 G PO PACK
17.0000 g | PACK | Freq: Every day | ORAL | Status: DC
  Administered 2021-01-03 – 2021-01-07 (×5): 17 g via ORAL
  Filled 2021-01-03 (×5): qty 1

## 2021-01-03 MED ORDER — SENNA 8.6 MG PO TABS
1.0000 | ORAL_TABLET | Freq: Every day | ORAL | Status: DC
  Administered 2021-01-03 – 2021-01-06 (×4): 8.6 mg via ORAL
  Filled 2021-01-03 (×4): qty 1

## 2021-01-03 MED ORDER — CHLORHEXIDINE GLUCONATE CLOTH 2 % EX PADS
6.0000 | MEDICATED_PAD | Freq: Every day | CUTANEOUS | Status: DC
  Administered 2021-01-03 – 2021-01-14 (×12): 6 via TOPICAL

## 2021-01-03 MED ORDER — PANTOPRAZOLE SODIUM 40 MG PO TBEC: 40.0000 mg | DELAYED_RELEASE_TABLET | Freq: Every day | ORAL | Status: DC

## 2021-01-03 MED ORDER — SODIUM CHLORIDE 0.9 % IV SOLN: INTRAVENOUS | Status: DC

## 2021-01-03 NOTE — ED Notes (Signed)
Pt provided breakfast tray and assisted with set up

## 2021-01-03 NOTE — Evaluation (Signed)
Physical Therapy Evaluation Patient Details Name: Sara Bright MRN: [id number] DOB: 1940-04-12 Today's Date: 01/03/2021  History of Present Illness  Sara Bright is a 80 y.o. female with history of hypertension, CKD, diabetes, OSA on CPAP who comes in with concerns for hyperglycemia and lethargy.    Clinical Impression  Pt received in Semi-Fowler's position and agreeable to therapy.  Pt notes feeling sleepy upon arrival but woke up as evaluation progressed and was able to answer all questions with confirmation from daughter, Scarlet.  Pt notes that she wants to get up and go home, but has had a rough October.  Pt is said to have broken bones within the R foot, and is WBAT per pt and daughter.  Pt was independent prior to her foot incident, which she is unaware of how she broke her bones.  Pt was bale to live at home without any assistance, utilizing a walker with outside ambulation and furniture/wall cruising while inside the home.   Pt performed bed-level exercises with some difficulty due to weakness in the LE's, but put forth good effort.  Pt then declined performing bed mobility due to feeling extremely nauseous.  All pt needs were met and questions answered.  Pt will benefit from skilled PT intervention to increase independence and safety with basic mobility in preparation for discharge to the venue listed below.          Recommendations for follow up therapy are one component of a multi-disciplinary discharge planning process, led by the attending physician.  Recommendations may be updated based on patient status, additional functional criteria and insurance authorization.  Follow Up Recommendations Skilled nursing-short term rehab (<3 hours/day)    Assistance Recommended at Discharge Frequent or constant Supervision/Assistance  Functional Status Assessment Patient has had a recent decline in their functional status and demonstrates the ability to make significant improvements in  function in a reasonable and predictable amount of time.  Equipment Recommendations  None recommended by PT    Recommendations for Other Services       Precautions / Restrictions Restrictions Weight Bearing Restrictions: Yes RLE Weight Bearing: Weight bearing as tolerated      Mobility  Bed Mobility               General bed mobility comments: deferred due to being nauseous    Transfers                        Ambulation/Gait                Stairs            Wheelchair Mobility    Modified Rankin (Stroke Patients Only)       Balance                                             Pertinent Vitals/Pain Pain Assessment: No/denies pain    Home Living Family/patient expects to be discharged to:: Private residence Living Arrangements: Alone Available Help at Discharge: Family;Available 24 hours/day Type of Home: House Home Access: Stairs to enter Entrance Stairs-Rails: Can reach both;Right;Left Entrance Stairs-Number of Steps: 2   Home Layout: One level Home Equipment: Conservation officer, nature (2 wheels);Cane - single point;Grab bars - toilet;Grab bars - tub/shower      Prior Function Prior Level of Function : Independent/Modified Independent  Hand Dominance   Dominant Hand: Right    Extremity/Trunk Assessment   Upper Extremity Assessment Upper Extremity Assessment: Generalized weakness    Lower Extremity Assessment Lower Extremity Assessment: Generalized weakness       Communication   Communication: No difficulties  Cognition Arousal/Alertness: Awake/alert Behavior During Therapy: WFL for tasks assessed/performed Overall Cognitive Status: Within Functional Limits for tasks assessed                                          General Comments      Exercises Total Joint Exercises Ankle Circles/Pumps: AROM;Strengthening;Both;10 reps;Supine Quad Sets:  AROM;Strengthening;Both;10 reps;Supine Gluteal Sets: AROM;Strengthening;Both;10 reps;Supine Heel Slides: AROM;Strengthening;Both;10 reps;Supine Hip ABduction/ADduction: AROM;Strengthening;Both;10 reps;Supine Straight Leg Raises: AROM;Strengthening;Both;10 reps;Supine (pt with difficulty performing) Other Exercises Other Exercises: Pt educated on role of PT and services provided during hospital stay.  Pt also encouraged to continue with therapeutic exercises in order to prevent deconditioning during hospital stay.   Assessment/Plan    PT Assessment Patient needs continued PT services  PT Problem List Decreased strength;Decreased range of motion;Decreased activity tolerance;Decreased balance;Decreased mobility;Decreased knowledge of use of DME;Decreased safety awareness       PT Treatment Interventions DME instruction;Gait training;Stair training;Functional mobility training;Therapeutic activities;Therapeutic exercise;Balance training;Neuromuscular re-education    PT Goals (Current goals can be found in the Care Plan section)  Acute Rehab PT Goals Patient Stated Goal: to go home. PT Goal Formulation: With patient/family Time For Goal Achievement: 01/17/21 Potential to Achieve Goals: Fair    Frequency Min 3X/week   Barriers to discharge Inaccessible home environment Pt unable to ambulate at this time due to feeling nauseous.  Pt will need to attempt stairs and community ambulation prior to d/c home.    Co-evaluation               AM-PAC PT "6 Clicks" Mobility  Outcome Measure Help needed turning from your back to your side while in a flat bed without using bedrails?: A Lot Help needed moving from lying on your back to sitting on the side of a flat bed without using bedrails?: A Lot Help needed moving to and from a bed to a chair (including a wheelchair)?: A Lot Help needed standing up from a chair using your arms (e.g., wheelchair or bedside chair)?: A Lot Help needed to walk  in hospital room?: Total Help needed climbing 3-5 steps with a railing? : Total 6 Click Score: 10    End of Session   Activity Tolerance: Other (comment) (Patient limited by nausea) Patient left: in bed;with call bell/phone within reach;with family/visitor present Nurse Communication: Mobility status PT Visit Diagnosis: Unsteadiness on feet (R26.81);Other abnormalities of gait and mobility (R26.89);Muscle weakness (generalized) (M62.81);Difficulty in walking, not elsewhere classified (R26.2);Pain Pain - Right/Left: Right Pain - part of body: Ankle and joints of foot    Time: 9191-6606 PT Time Calculation (min) (ACUTE ONLY): 52 min   Charges:   PT Evaluation $PT Eval Low Complexity: 1 Low PT Treatments $Therapeutic Exercise: 23-37 mins        Gwenlyn Saran, PT, DPT 01/03/21, 4:37 PM   Christie Nottingham 01/03/2021, 4:31 PM

## 2021-01-03 NOTE — Progress Notes (Signed)
Inpatient Diabetes Program Recommendations  AACE/ADA: New Consensus Statement on Inpatient Glycemic Control   Target Ranges:  Prepandial:   less than 140 mg/dL      [facility] postprandial:   less than 180 mg/dL (1-2 hours)      Critically ill patients:  140 - 180 mg/dL   Results for CONLEIGH, HEINLEIN (MRN [id number]) as of 01/03/2021 08:31  Ref. Range 01/02/2021 14:56 01/02/2021 18:30 01/02/2021 20:32 01/02/2021 20:51 01/02/2021 23:52 01/03/2021 08:13  Glucose-Capillary Latest Ref Range: 70 - 99 mg/dL 321 (H) 83 35 (LL) 159 (H) 96 307 (H)    Review of Glycemic Control  Diabetes history: DM2 Outpatient Diabetes medications: Ozempic 0.25 mg Qweek (per note by Dr. Jari Pigg on 01/02/21 patient has not had for 1 month due to facility being out of medication), Actos 30 mg daily, Amaryl 0.5 mg daily Current orders for Inpatient glycemic control: Novolog 0-15 units TID with meals; Solumedrol 55 mg Q12H  Inpatient Diabetes Program Recommendations:    Insulin: If steroids are continued, please consider ordering Levemir 10 units Q24H.  If post prandial glucose is consistently over 180 mg/dl with steroids and added basal insulin, may want to consider ordering Novolog 3 units TID with meals for meal coverage if patient eats at least 50% of meals.  NOTE:  Patient from [facility] and brought in with concerns for hyperglycemia and lethargy.  Patient was reportedly on Ozempic for about a month. Per ED Triage note on 01/02/21 by J. Chapmon, RN, patient received Humalog sliding scale at facility prior to transport to hospital.  Per H&P patient admitted with AMS, respiratory failure with hypoxia, COVID-Pneumonia. Initial glucose 321 mg/dl and CBG down to 35 mg/dl at 20:32 on 01/02/21 (no insulin given since arrival to hospital). Fasting CBG 307 mg/dl and patient is ordered steroids. Would recommend ordering low dose basal insulin. Then if glucose is consistently over 180 mg/dl with added basal insulin, may want to  consider adding meal coverage insulin as well.  Thanks, Barnie Alderman, RN, MSN, CDE Diabetes Coordinator Inpatient Diabetes Program [phone] (Team Pager from 8am to 5pm)

## 2021-01-03 NOTE — ED Notes (Signed)
Informed RN bed assigned 

## 2021-01-03 NOTE — Progress Notes (Signed)
Patient ID: Sara Bright, female   DOB: 1940/07/18, 80 y.o.   MRN: [id number] [facility] PROGRESS NOTE  Sara Bright CLE:[id number] DOB: [birth year] DOA: 01/02/2021 PCP: Anthonette Legato, MD  HPI/Subjective: Patient sent in with altered mental status.  Diagnosed with COVID.  Patient states that her sugars have been on the lower side at facility also because they did not have her medication right.  Objective: Vitals:   01/03/21 0800 01/03/21 1130  BP: (!) 133/44 (!) 125/48  Pulse: 68 73  Resp: 14 16  Temp:    SpO2: 95% 97%    Intake/Output Summary (Last 24 hours) at 01/03/2021 1156 Last data filed at 01/03/2021 0859 Gross per 24 hour  Intake --  Output 200 ml  Net -200 ml   Filed Weights   01/02/21 1451  Weight: 110.3 kg    ROS: Review of Systems  Respiratory:  Negative for shortness of breath.   Cardiovascular:  Negative for chest pain.  Gastrointestinal:  Negative for abdominal pain, nausea and vomiting.  Exam: Physical Exam HENT:     Head: Normocephalic.     Mouth/Throat:     Pharynx: No oropharyngeal exudate.  Eyes:     General: Lids are normal.     Conjunctiva/sclera: Conjunctivae normal.  Cardiovascular:     Rate and Rhythm: Normal rate and regular rhythm.     Heart sounds: Normal heart sounds, S1 normal and S2 normal.  Pulmonary:     Breath sounds: No decreased breath sounds, wheezing, rhonchi or rales.  Abdominal:     Palpations: Abdomen is soft.     Tenderness: There is no abdominal tenderness.  Musculoskeletal:     Right lower leg: Swelling present.     Left lower leg: Swelling present.  Skin:    General: Skin is warm.     Findings: No rash.  Neurological:     Mental Status: She is alert and oriented to person, place, and time.      Scheduled Meds:  allopurinol  100 mg Oral Daily   vitamin C  500 mg Oral Daily   aspirin EC  81 mg Oral Daily   ferrous sulfate  325 mg Oral Daily   gemfibrozil  600 mg Oral Daily   heparin  5,000  Units Subcutaneous Q8H   insulin aspart  0-15 Units Subcutaneous TID WC   ipratropium  2 puff Inhalation Q6H   latanoprost  1 drop Both Eyes QHS   multivitamin with minerals  1 tablet Oral Daily   pantoprazole (PROTONIX) IV  40 mg Intravenous Q24H   polyethylene glycol  17 g Oral Daily   senna  1 tablet Oral QHS   zinc sulfate  220 mg Oral Daily   Continuous Infusions:  remdesivir 100 mg in NS 100 mL 100 mg (01/03/21 1125)    Assessment/Plan:  Acute metabolic encephalopathy.  This seems improved at this point. COVID-19 infection.  Since the patient does not on oxygen we will hold off on steroids at this point I am okay with continuing remdesivir.  Airborne isolation. Acute cystitis with hematuria.  Given Rocephin in the emergency room I will continue.  Follow-up urine culture result Acute kidney injury on chronic kidney disease stage IV.  Gentle IV fluids and monitor Type 2 diabetes mellitus with hypoglycemia at facility holding medications at this point.  Sugars high secondary to steroids will discontinue steroids and monitor sugars.  Hemoglobin A1c 9.0 Hypercalcemia could be with poor appetite.  Check a PTH, phosphorus  and vitamin D Previous fracture of bones in her right foot.  Daughter to get her walking boot.  PT evaluation. Transitional care team evaluation to see when I can come back to rehab with COVID infection. Hypertriglyceridemia on gemfibrozil Iron deficiency anemia.  Last hemoglobin 8.1.  Continue to monitor. Glaucoma on latanoprost GERD on PPI        Code Status:     Code Status Orders  (From admission, onward)           Start     Ordered   01/02/21 2120  Full code  Continuous        01/02/21 2142           Code Status History     This patient has a current code status but no historical code status.      Family Communication: Spoke with daughter at the bedside Disposition Plan: Status is: Inpatient  Antibiotics: Rocephin  Time spent: 28  minutes  [place]

## 2021-01-04 DIAGNOSIS — N390 Urinary tract infection, site not specified: Secondary | ICD-10-CM

## 2021-01-04 DIAGNOSIS — U071 COVID-19: Secondary | ICD-10-CM | POA: Diagnosis not present

## 2021-01-04 DIAGNOSIS — N184 Chronic kidney disease, stage 4 (severe): Secondary | ICD-10-CM

## 2021-01-04 DIAGNOSIS — B9689 Other specified bacterial agents as the cause of diseases classified elsewhere: Secondary | ICD-10-CM

## 2021-01-04 DIAGNOSIS — E1122 Type 2 diabetes mellitus with diabetic chronic kidney disease: Secondary | ICD-10-CM

## 2021-01-04 DIAGNOSIS — E210 Primary hyperparathyroidism: Secondary | ICD-10-CM | POA: Diagnosis not present

## 2021-01-04 DIAGNOSIS — G9341 Metabolic encephalopathy: Secondary | ICD-10-CM | POA: Diagnosis not present

## 2021-01-04 LAB — COMPREHENSIVE METABOLIC PANEL
ALT: 16 U/L (ref 0–44)
AST: 31 U/L (ref 15–41)
Albumin: 2.3 g/dL — ABNORMAL LOW (ref 3.5–5.0)
Alkaline Phosphatase: 53 U/L (ref 38–126)
Anion gap: 11 (ref 5–15)
BUN: 77 mg/dL — ABNORMAL HIGH (ref 8–23)
CO2: 26 mmol/L (ref 22–32)
Calcium: 10.5 mg/dL — ABNORMAL HIGH (ref 8.9–10.3)
Chloride: 99 mmol/L (ref 98–111)
Creatinine, Ser: 3.59 mg/dL — ABNORMAL HIGH (ref 0.44–1.00)
GFR, Estimated: 12 mL/min — ABNORMAL LOW (ref >60)
Glucose, Bld: 125 mg/dL — ABNORMAL HIGH (ref 70–99)
Potassium: 3.5 mmol/L (ref 3.5–5.1)
Sodium: 136 mmol/L (ref 135–145)
Total Bilirubin: 0.7 mg/dL (ref 0.3–1.2)
Total Protein: 6.2 g/dL — ABNORMAL LOW (ref 6.5–8.1)

## 2021-01-04 LAB — URINE CULTURE: Culture: >=100000 — AB

## 2021-01-04 LAB — CBC
HCT: 24.5 % — ABNORMAL LOW (ref 36.0–46.0)
Hemoglobin: 7.7 g/dL — ABNORMAL LOW (ref 12.0–15.0)
MCH: 32.4 pg (ref 26.0–34.0)
MCHC: 31.4 g/dL (ref 30.0–36.0)
MCV: 102.9 fL — ABNORMAL HIGH (ref 80.0–100.0)
Platelets: 180 10*3/uL (ref 150–400)
RBC: 2.38 MIL/uL — ABNORMAL LOW (ref 3.87–5.11)
RDW: 15.5 % (ref 11.5–15.5)
WBC: 7 10*3/uL (ref 4.0–10.5)
nRBC: 0.3 % — ABNORMAL HIGH (ref 0.0–0.2)

## 2021-01-04 LAB — PARATHYROID HORMONE, INTACT (NO CA): PTH: 99 pg/mL — ABNORMAL HIGH (ref 15–65)

## 2021-01-04 LAB — GLUCOSE, CAPILLARY
Glucose-Capillary: 144 mg/dL — ABNORMAL HIGH (ref 70–99)
Glucose-Capillary: 158 mg/dL — ABNORMAL HIGH (ref 70–99)
Glucose-Capillary: 215 mg/dL — ABNORMAL HIGH (ref 70–99)
Glucose-Capillary: 114 mg/dL — ABNORMAL HIGH (ref 70–99)

## 2021-01-04 MED ORDER — SODIUM CHLORIDE 0.9 % IV SOLN: INTRAVENOUS | Status: DC

## 2021-01-04 MED ORDER — SODIUM CHLORIDE 0.9 % IV SOLN
500.0000 mg | Freq: Two times a day (BID) | INTRAVENOUS | Status: DC
  Administered 2021-01-04 – 2021-01-05 (×4): 500 mg via INTRAVENOUS
  Filled 2021-01-04 (×2): qty 0.5
  Filled 2021-01-04 (×3): qty 500
  Filled 2021-01-04: qty 0.5

## 2021-01-04 MED ORDER — VITAMIN D 25 MCG (1000 UNIT) PO TABS
1000.0000 [IU] | ORAL_TABLET | Freq: Every day | ORAL | Status: DC
  Administered 2021-01-04 – 2021-01-15 (×12): 1000 [IU] via ORAL
  Filled 2021-01-04 (×12): qty 1

## 2021-01-04 NOTE — Progress Notes (Signed)
Patient ID: Sara Bright, female   DOB: [birth year], 80 y.o.   MRN: [id number] [facility] PROGRESS NOTE  Sara Bright:[id number] DOB: May 13, 1940 DOA: 01/02/2021 PCP: Anthonette Legato, MD  HPI/Subjective: Patient had some nausea yesterday so did not do well with physical therapy.  Still has a little nausea.  Some cough.  Still feels weak.  Diagnosed with COVID.  Called by pharmacy about ESBL Klebsiella growing in urine culture.  Objective: Vitals:   01/04/21 0748 01/04/21 1241  BP: (!) 123/43 (!) 127/50  Pulse: 76 69  Resp: 17 19  Temp: 98.6 F (37 C) 97.8 F (36.6 C)  SpO2: 99% 95%    Intake/Output Summary (Last 24 hours) at 01/04/2021 1309 Last data filed at 01/04/2021 0400 Gross per 24 hour  Intake 303.27 ml  Output --  Net 303.27 ml   Filed Weights   01/02/21 1451 01/03/21 1957 01/03/21 1957  Weight: 110.3 kg 109.3 kg 109.3 kg    ROS: Review of Systems  Respiratory:  Positive for cough. Negative for shortness of breath.   Cardiovascular:  Negative for chest pain.  Gastrointestinal:  Positive for nausea. Negative for abdominal pain and vomiting.  Musculoskeletal:  Positive for joint pain.  Exam: Physical Exam HENT:     Head: Normocephalic.     Mouth/Throat:     Pharynx: No oropharyngeal exudate.  Eyes:     General: Lids are normal.     Conjunctiva/sclera: Conjunctivae normal.  Cardiovascular:     Rate and Rhythm: Normal rate and regular rhythm.     Heart sounds: Normal heart sounds, S1 normal and S2 normal.  Pulmonary:     Breath sounds: Examination of the right-lower field reveals decreased breath sounds. Examination of the left-lower field reveals decreased breath sounds. Decreased breath sounds present. No wheezing, rhonchi or rales.  Abdominal:     Palpations: Abdomen is soft.     Tenderness: There is no abdominal tenderness.  Musculoskeletal:     Right lower leg: No swelling.     Left lower leg: No swelling.  Skin:    General: Skin is  warm.     Findings: No rash.  Neurological:     Mental Status: She is alert and oriented to person, place, and time.      Scheduled Meds:  allopurinol  100 mg Oral Daily   vitamin C  500 mg Oral Daily   aspirin EC  81 mg Oral Daily   Chlorhexidine Gluconate Cloth  6 each Topical Daily   cholecalciferol  1,000 Units Oral Daily   ferrous sulfate  325 mg Oral Daily   gemfibrozil  600 mg Oral Daily   heparin  5,000 Units Subcutaneous Q8H   insulin aspart  0-15 Units Subcutaneous TID WC   insulin aspart  3 Units Subcutaneous TID WC   ipratropium  2 puff Inhalation Q6H   latanoprost  1 drop Both Eyes QHS   multivitamin with minerals  1 tablet Oral Daily   pantoprazole (PROTONIX) IV  40 mg Intravenous Q24H   polyethylene glycol  17 g Oral Daily   senna  1 tablet Oral QHS   zinc sulfate  220 mg Oral Daily   Continuous Infusions:  sodium chloride 75 mL/hr at 01/04/21 0859   meropenem (MERREM) IV 500 mg (01/04/21 1233)   remdesivir 100 mg in NS 100 mL 100 mg (01/04/21 0904)    Assessment/Plan:  ESBL Klebsiella acute cystitis with hematuria.  Rocephin switched over to meropenem. Acute metabolic  encephalopathy.  Improved from admission. COVID-19 infection.  Continue remdesivir.  Airborne isolation. Acute kidney injury on chronic kidney disease stage IV.  Continue IV fluids.  Creatinine worsened overnight from 2.75 up to 3.59.  We will get nephrology consultation.  Primary hyperparathyroidism with hypercalcemia and vitamin D deficiency.  Supplement vitamin D.  Usually with primary hyperparathyroidism we see a low phosphorus but her phosphorus is on the higher end of normal. Type 2 diabetes mellitus with chronic kidney disease.  Hemoglobin A1c is 9.0.  Initially sugars high with steroids but since we discontinued steroids need to watch closely for hypoglycemia.  On sliding scale insulin for night. Essential hypertension holding blood pressure medications currently Previous fracture bones  of the right foot.  Continue working with physical therapy and walking boot. Hypertriglyceridemia on gemfibrozil Iron deficiency anemia.  Hemoglobin 7.7 today.  Continue to monitor with IV fluids GERD on PPI Glaucoma on latanoprost        Code Status:     Code Status Orders  (From admission, onward)           Start     Ordered   01/02/21 2120  Full code  Continuous        01/02/21 2142           Code Status History     This patient has a current code status but no historical code status.      Advance Directive Documentation    Flowsheet Row Most Recent Value  Type of Advance Directive Healthcare Power of Attorney  Pre-existing out of facility DNR order (yellow form or pink MOST form) --  "MOST" Form in Place? --      Family Communication: Updated patient's daughter on the phone Disposition Plan: Status is: Inpatient  Consultants: Nephrology  Antibiotics: Rocephin switched over to meropenem today  Time spent: 27 minutes  [place]

## 2021-01-05 DIAGNOSIS — I10 Essential (primary) hypertension: Secondary | ICD-10-CM

## 2021-01-05 DIAGNOSIS — N179 Acute kidney failure, unspecified: Secondary | ICD-10-CM | POA: Diagnosis not present

## 2021-01-05 DIAGNOSIS — U071 COVID-19: Secondary | ICD-10-CM | POA: Diagnosis not present

## 2021-01-05 DIAGNOSIS — N390 Urinary tract infection, site not specified: Secondary | ICD-10-CM | POA: Diagnosis not present

## 2021-01-05 DIAGNOSIS — N2581 Secondary hyperparathyroidism of renal origin: Secondary | ICD-10-CM

## 2021-01-05 DIAGNOSIS — G9341 Metabolic encephalopathy: Secondary | ICD-10-CM | POA: Diagnosis not present

## 2021-01-05 LAB — COMPREHENSIVE METABOLIC PANEL
ALT: 15 U/L (ref 0–44)
AST: 30 U/L (ref 15–41)
Albumin: 2.3 g/dL — ABNORMAL LOW (ref 3.5–5.0)
Alkaline Phosphatase: 51 U/L (ref 38–126)
Anion gap: 11 (ref 5–15)
BUN: 65 mg/dL — ABNORMAL HIGH (ref 8–23)
CO2: 24 mmol/L (ref 22–32)
Calcium: 10.3 mg/dL (ref 8.9–10.3)
Chloride: 104 mmol/L (ref 98–111)
Creatinine, Ser: 2.47 mg/dL — ABNORMAL HIGH (ref 0.44–1.00)
GFR, Estimated: 19 mL/min — ABNORMAL LOW (ref >60)
Glucose, Bld: 121 mg/dL — ABNORMAL HIGH (ref 70–99)
Potassium: 3.6 mmol/L (ref 3.5–5.1)
Sodium: 139 mmol/L (ref 135–145)
Total Bilirubin: 0.9 mg/dL (ref 0.3–1.2)
Total Protein: 5.9 g/dL — ABNORMAL LOW (ref 6.5–8.1)

## 2021-01-05 LAB — HEMOGLOBIN: Hemoglobin: 7.8 g/dL — ABNORMAL LOW (ref 12.0–15.0)

## 2021-01-05 LAB — GLUCOSE, CAPILLARY
Glucose-Capillary: 161 mg/dL — ABNORMAL HIGH (ref 70–99)
Glucose-Capillary: 170 mg/dL — ABNORMAL HIGH (ref 70–99)
Glucose-Capillary: 213 mg/dL — ABNORMAL HIGH (ref 70–99)
Glucose-Capillary: 140 mg/dL — ABNORMAL HIGH (ref 70–99)

## 2021-01-05 MED ORDER — AMLODIPINE BESYLATE 5 MG PO TABS
5.0000 mg | ORAL_TABLET | Freq: Every day | ORAL | Status: DC
  Administered 2021-01-05 – 2021-01-06 (×2): 5 mg via ORAL
  Filled 2021-01-05 (×2): qty 1

## 2021-01-05 MED ORDER — ENSURE MAX PROTEIN PO LIQD
237.0000 mL | Freq: Two times a day (BID) | ORAL | Status: DC
  Administered 2021-01-05 – 2021-01-15 (×18): 237 mL via ORAL
  Filled 2021-01-05: qty 330

## 2021-01-05 NOTE — Progress Notes (Signed)
[facility], [place] 01/05/21  Subjective:   LOS: 3 10/29 0701 - 10/30 0700 In: 1407.6 [I.V.:1207.6; IV Piggyback:200] Out: 500 [Urine:500] Patient known to our practice from outpatient follow-up Presented to the emergency room for high blood sugar and confusion. She is a resident of [facility] This morning she states that her appetite is poor.  Her daughter is at bedside Denies any shortness of breath Currently on room air Tested positive for COVID on January 02, 2021 Review of labs suggest that her creatinine peaked at 3.6.  With IV hydration, today creatinine is improved to 2.47 Outpatient baseline is 1.31/GFR 41   Objective:  Vital signs in last 24 hours:  Temp:  [97.8 F (36.6 C)-99.7 F (37.6 C)] 99 F (37.2 C) (10/30 0847) Pulse Rate:  [68-86] 86 (10/30 0847) Resp:  [16-20] 20 (10/30 0847) BP: (125-155)/(42-55) 155/55 (10/30 0847) SpO2:  [95 %-100 %] 100 % (10/30 0847)  Weight change:  Filed Weights   01/02/21 1451 01/03/21 1957 01/03/21 1957  Weight: 110.3 kg 109.3 kg 109.3 kg    Intake/Output:    Intake/Output Summary (Last 24 hours) at 01/05/2021 1108 Last data filed at 01/05/2021 0602 Gross per 24 hour  Intake 1407.58 ml  Output 500 ml  Net 907.58 ml    Physical Exam: General:  No acute distress, laying in the bed  HEENT  anicteric, moist oral mucous membrane  Pulm/lungs  normal breathing effort, lungs are clear to auscultation  CVS/Heart  regular rhythm, no rub or gallop  Abdomen:   Soft, nontender  Extremities:  No peripheral edema  Neurologic:  Alert, oriented, able to follow commands  Skin:  No acute rashes    Basic Metabolic Panel:  Recent Labs  Lab 01/02/21 1504 01/02/21 2227 01/03/21 0626 01/03/21 1658 01/04/21 0500 01/05/21 0439  NA 136  --  139  --  136 139  K 3.2*  --  4.7  --  3.5 3.6  CL 94*  --  99  --  99 104  CO2 28  --  24  --  26 24  GLUCOSE 330*  --  307*  --  125* 121*  BUN 64*  --   64*  --  77* 65*  CREATININE 2.85* 2.83* 2.75*  --  3.59* 2.47*  CALCIUM 11.4*  --  11.1*  --  10.5* 10.3  PHOS  --   --   --  4.2  --   --      CBC: Recent Labs  Lab 01/02/21 1504 01/02/21 2227 01/04/21 0500 01/05/21 0439  WBC 8.6 8.9 7.0  --   HGB 8.3* 8.1* 7.7* 7.8*  HCT 25.5* 24.7* 24.5*  --   MCV 103.2* 104.2* 102.9*  --   PLT 190 173 180  --      No results found for: HEPBSAG, HEPBSAB, HEPBIGM    Microbiology:  Recent Results (from the past 240 hour(s))  Resp Panel by RT-PCR (Flu A&B, Covid) Nasopharyngeal Swab     Status: Abnormal   Collection Time: 01/02/21  4:55 PM   Specimen: Nasopharyngeal Swab; Nasopharyngeal(NP) swabs in vial transport medium  Result Value Ref Range Status   SARS Coronavirus 2 by RT PCR POSITIVE (A) NEGATIVE Corrected    Comment: RESULT CALLED TO, READ BACK BY AND VERIFIED WITH: HINA HELSEL 1921 01/02/21 MU (NOTE) SARS-CoV-2 target nucleic acids are DETECTED.  The SARS-CoV-2 RNA is generally detectable in upper respiratory specimens during the acute phase of infection. Positive  results are indicative of the presence of the identified virus, but do not rule out bacterial infection or co-infection with other pathogens not detected by the test. Clinical correlation with patient history and other diagnostic information is necessary to determine patient infection status. The expected result is Negative.  Fact Sheet for Patients: EntrepreneurPulse.com.au  Fact Sheet for Healthcare Providers: IncredibleEmployment.be  This test is not yet approved or cleared by the [place] FDA and  has been authorized for detection and/or diagnosis of SARS-CoV-2 by FDA under an Emergency Use Authorization (EUA).  This EUA will remain in effect (meaning this test can be used ) for the duration of  the COVID-19 declaration under Section 564(b)(1) of the Act, 21 U.S.C. section 360bbb-3(b)(1), unless the authorization  is terminated or revoked sooner.  CORRECTED ON 10/27 AT 1952: PREVIOUSLY REPORTED AS POSITIVE CRITICAL RESULT CALLED TO, READ BACK BY AND VERIFIED WITH: [place] 01/02/21 MU    Influenza A by PCR NEGATIVE NEGATIVE Final   Influenza B by PCR NEGATIVE NEGATIVE Final    Comment: (NOTE) The Xpert Xpress SARS-CoV-2/FLU/RSV plus assay is intended as an aid in the diagnosis of influenza from Nasopharyngeal swab specimens and should not be used as a sole basis for treatment. Nasal washings and aspirates are unacceptable for Xpert Xpress SARS-CoV-2/FLU/RSV testing.  Fact Sheet for Patients: EntrepreneurPulse.com.au  Fact Sheet for Healthcare Providers: IncredibleEmployment.be  This test is not yet approved or cleared by the [place] FDA and has been authorized for detection and/or diagnosis of SARS-CoV-2 by FDA under an Emergency Use Authorization (EUA). This EUA will remain in effect (meaning this test can be used) for the duration of the COVID-19 declaration under Section 564(b)(1) of the Act, 21 U.S.C. section 360bbb-3(b)(1), unless the authorization is terminated or revoked.  Performed at [facility], [address] [place], [place] [address]   Urine Culture     Status: Abnormal   Collection Time: 01/02/21  6:00 PM   Specimen: Urine, Clean Catch  Result Value Ref Range Status   Specimen Description   Final    URINE, CLEAN CATCH Performed at [facility], [address] [place], [place] [address]    Special Requests   Final    NONE Performed at [facility], [address] [place], [place] [address]    Culture (A)  Final    >=100,000 COLONIES/mL KLEBSIELLA PNEUMONIAE Confirmed Extended Spectrum Beta-Lactamase Producer (ESBL).  In bloodstream infections from ESBL organisms, carbapenems are preferred over piperacillin/tazobactam. They are shown to have a lower risk of mortality.    Report Status  01/04/2021 FINAL  Final   Organism ID, Bacteria KLEBSIELLA PNEUMONIAE (A)  Final      Susceptibility   Klebsiella pneumoniae - MIC*    AMPICILLIN >=32 RESISTANT Resistant     CEFAZOLIN >=64 RESISTANT Resistant     CEFEPIME >=32 RESISTANT Resistant     CEFTRIAXONE >=64 RESISTANT Resistant     CIPROFLOXACIN 0.5 INTERMEDIATE Intermediate     GENTAMICIN <=1 SENSITIVE Sensitive     IMIPENEM <=0.25 SENSITIVE Sensitive     NITROFURANTOIN 64 INTERMEDIATE Intermediate     TRIMETH/SULFA >=320 RESISTANT Resistant     AMPICILLIN/SULBACTAM 16 INTERMEDIATE Intermediate     PIP/TAZO <=4 SENSITIVE Sensitive     * >=100,000 COLONIES/mL KLEBSIELLA PNEUMONIAE    Coagulation Studies: No results for input(s): LABPROT, INR in the last 72 hours.  Urinalysis: Recent Labs    01/02/21 1759  COLORURINE YELLOW*  LABSPEC 1.014  PHURINE 5.0  GLUCOSEU >=500*  HGBUR SMALL*  BILIRUBINUR NEGATIVE  KETONESUR NEGATIVE  PROTEINUR 100*  NITRITE NEGATIVE  LEUKOCYTESUR LARGE*      Imaging: No results found.   Medications:    sodium chloride 75 mL/hr at 01/05/21 0113   meropenem (MERREM) IV 500 mg (01/04/21 2224)   remdesivir 100 mg in NS 100 mL 100 mg (01/05/21 0920)    allopurinol  100 mg Oral Daily   vitamin C  500 mg Oral Daily   aspirin EC  81 mg Oral Daily   Chlorhexidine Gluconate Cloth  6 each Topical Daily   cholecalciferol  1,000 Units Oral Daily   ferrous sulfate  325 mg Oral Daily   gemfibrozil  600 mg Oral Daily   heparin  5,000 Units Subcutaneous Q8H   insulin aspart  0-15 Units Subcutaneous TID WC   insulin aspart  3 Units Subcutaneous TID WC   ipratropium  2 puff Inhalation Q6H   latanoprost  1 drop Both Eyes QHS   multivitamin with minerals  1 tablet Oral Daily   pantoprazole (PROTONIX) IV  40 mg Intravenous Q24H   polyethylene glycol  17 g Oral Daily   Ensure Max Protein  237 mL Oral BID BM   senna  1 tablet Oral QHS   zinc sulfate  220 mg Oral Daily   albuterol,  guaiFENesin-dextromethorphan, ondansetron  Assessment/ Plan:  80 y.o. female with   Principal Problem:   AMS (altered mental status) Active Problems:   Anemia due to stage 3 chronic kidney disease (HCC)   Diabetes (HCC)   Essential hypertension   GERD (gastroesophageal reflux disease)   IDA (iron deficiency anemia)   COVID-19 virus infection   Acute respiratory failure with hypoxia (HCC)   Urinary tract infection due to ESBL Klebsiella   Primary hyperparathyroidism ([place])   CKD stage 4 due to type 2 diabetes mellitus ([place])   #.  Acute kidney injury on CKD st 3B Recent Labs    01/02/21 2227 01/03/21 0626 01/04/21 0500 01/05/21 0439  CREATININE 2.83* 2.75* 3.59* 2.47*  CKD risk factors include advanced age, diabetes, hypertension AKI likely secondary to ATN from concurrent illness including UTI and COVID Continue supportive care Serum creatinine has started to improve  #. Anemia of CKD  Lab Results  Component Value Date   HGB 7.8 (L) 01/05/2021  Agree with oral iron supplementation Check iron studies in the morning   #. SHPTH with hypercalcemia     Component Value Date/Time   PTH 99 (H) 01/03/2021 0727   Lab Results  Component Value Date   PHOS 4.2 01/03/2021  Was recently started on calcitriol. Agree with discontinuing   #. Diabetes type 2 with CKD Hgb A1c MFr Bld (%)  Date Value  01/02/2021 9.0 (H)  Blood sugar control is suboptimal May be a candidate for SGLT2 inhibitor as outpatient  #Urinary tract infection Urine culture showing Klebsiella pneumoniae Currently on IV meropenem  #SARS-CoV-2 + January 02, 2021 Currently getting treatment with IV remdesivir    LOS: [place] 10/30/202211:08 AM  [place], [place] patient is known

## 2021-01-05 NOTE — Progress Notes (Signed)
Patient ID: Sara Bright, female   DOB: 14-Oct-1940, 80 y.o.   MRN: [id number] [facility] PROGRESS NOTE  Sara Bright:[id number] DOB: November 27, 1940 DOA: 01/02/2021 PCP: Anthonette Legato, MD  HPI/Subjective: Patient has some intermittent slurring of some words.  Still does not feel great.  Family hoping that she can get stronger while here and go home.  ESBL Klebsiella growing out of urine culture.  Found to be COVID-positive.  Objective: Vitals:   01/05/21 0847 01/05/21 1238  BP: (!) 155/55 (!) 150/61  Pulse: 86 76  Resp: 20 17  Temp: 99 F (37.2 C) 98.8 F (37.1 C)  SpO2: 100% 98%    Intake/Output Summary (Last 24 hours) at 01/05/2021 1428 Last data filed at 01/05/2021 1119 Gross per 24 hour  Intake 1407.58 ml  Output 1400 ml  Net 7.58 ml   Filed Weights   01/02/21 1451 01/03/21 1957 01/03/21 1957  Weight: 110.3 kg 109.3 kg 109.3 kg    ROS: Review of Systems  Respiratory:  Positive for shortness of breath.   Cardiovascular:  Negative for chest pain.  Gastrointestinal:  Positive for nausea. Negative for abdominal pain and vomiting.  Exam: Physical Exam HENT:     Head: Normocephalic.     Mouth/Throat:     Pharynx: No oropharyngeal exudate.  Eyes:     General: Lids are normal.     Conjunctiva/sclera: Conjunctivae normal.  Cardiovascular:     Rate and Rhythm: Normal rate and regular rhythm.     Heart sounds: Normal heart sounds, S1 normal and S2 normal.  Pulmonary:     Breath sounds: No decreased breath sounds, wheezing, rhonchi or rales.  Abdominal:     Palpations: Abdomen is soft.     Tenderness: There is no abdominal tenderness.  Musculoskeletal:     Right lower leg: Swelling present.     Left lower leg: Swelling present.  Skin:    General: Skin is warm.     Findings: No rash.  Neurological:     Mental Status: She is alert and oriented to person, place, and time.     Comments: Some intermittent slurring of some words      Scheduled Meds:   allopurinol  100 mg Oral Daily   vitamin C  500 mg Oral Daily   aspirin EC  81 mg Oral Daily   Chlorhexidine Gluconate Cloth  6 each Topical Daily   cholecalciferol  1,000 Units Oral Daily   ferrous sulfate  325 mg Oral Daily   gemfibrozil  600 mg Oral Daily   heparin  5,000 Units Subcutaneous Q8H   insulin aspart  0-15 Units Subcutaneous TID WC   insulin aspart  3 Units Subcutaneous TID WC   ipratropium  2 puff Inhalation Q6H   latanoprost  1 drop Both Eyes QHS   multivitamin with minerals  1 tablet Oral Daily   pantoprazole (PROTONIX) IV  40 mg Intravenous Q24H   polyethylene glycol  17 g Oral Daily   Ensure Max Protein  237 mL Oral BID BM   senna  1 tablet Oral QHS   zinc sulfate  220 mg Oral Daily   Continuous Infusions:  sodium chloride 75 mL/hr at 01/05/21 0113   meropenem (MERREM) IV 500 mg (01/05/21 1353)   remdesivir 100 mg in NS 100 mL 100 mg (01/05/21 0920)    Assessment/Plan:  ESBL Klebsiella with acute cystitis with hematuria.  Switched antibiotics to meropenem yesterday. Acute metabolic encephalopathy with intermittent slurring of words.  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 COVID-19 infection.  Continue remdesivir and airborne isolation Acute kidney injury on chronic kidney disease stage IV.  Creatinine peaked at 3.59 and has come down to 2.47 with IV fluid Secondary hyperparathyroidism with hypercalcemia and vitamin D deficiency.  Continue vitamin D supplementation.  Calcium 10.3 today Type 2 diabetes mellitus with chronic kidney disease stage IV.  Hemoglobin A1c elevated at 9.0.  Patient not eating that well so I am not sure what to do with respect to giving insulin at this point.  Giving short acting insulin prior to meals. Essential hypertension.  We will add back low-dose Norvasc since blood pressure starting to rise. Previous fractures bone of the right foot.  Patient and family interested in going home.   Patient will need to get stronger while here Hypertriglyceridemia on gemfibrozil Iron deficiency anemia.  Hemoglobin 7.8 today GERD on PPI Glaucoma left eye with        Code Status:     Code Status Orders  (From admission, onward)           Start     Ordered   01/02/21 2120  Full code  Continuous        01/02/21 2142           Code Status History     This patient has a current code status but no historical code status.      Advance Directive Documentation    Flowsheet Row Most Recent Value  Type of Advance Directive Healthcare Power of Attorney  Pre-existing out of facility DNR order (yellow form or pink MOST form) --  "MOST" Form in Place? --      Family Communication: Daughter at the bedside Disposition Plan: Status is: Inpatient  Antibiotics: Meropenem  Time spent: 28 minutes  [place]

## 2021-01-06 ENCOUNTER — Inpatient Hospital Stay: Payer: Medicare HMO

## 2021-01-06 DIAGNOSIS — N390 Urinary tract infection, site not specified: Secondary | ICD-10-CM | POA: Diagnosis not present

## 2021-01-06 DIAGNOSIS — R4781 Slurred speech: Secondary | ICD-10-CM

## 2021-01-06 DIAGNOSIS — D638 Anemia in other chronic diseases classified elsewhere: Secondary | ICD-10-CM

## 2021-01-06 DIAGNOSIS — G9341 Metabolic encephalopathy: Secondary | ICD-10-CM | POA: Diagnosis not present

## 2021-01-06 DIAGNOSIS — U071 COVID-19: Secondary | ICD-10-CM | POA: Diagnosis not present

## 2021-01-06 DIAGNOSIS — N183 Chronic kidney disease, stage 3 unspecified: Secondary | ICD-10-CM

## 2021-01-06 LAB — HEMOGLOBIN: Hemoglobin: 8.3 g/dL — ABNORMAL LOW (ref 12.0–15.0)

## 2021-01-06 LAB — COMPREHENSIVE METABOLIC PANEL
ALT: 17 U/L (ref 0–44)
AST: 28 U/L (ref 15–41)
Albumin: 2.7 g/dL — ABNORMAL LOW (ref 3.5–5.0)
Alkaline Phosphatase: 50 U/L (ref 38–126)
Anion gap: 11 (ref 5–15)
BUN: 43 mg/dL — ABNORMAL HIGH (ref 8–23)
CO2: 23 mmol/L (ref 22–32)
Calcium: 10.6 mg/dL — ABNORMAL HIGH (ref 8.9–10.3)
Chloride: 109 mmol/L (ref 98–111)
Creatinine, Ser: 1.62 mg/dL — ABNORMAL HIGH (ref 0.44–1.00)
GFR, Estimated: 32 mL/min — ABNORMAL LOW (ref >60)
Glucose, Bld: 169 mg/dL — ABNORMAL HIGH (ref 70–99)
Potassium: 3.7 mmol/L (ref 3.5–5.1)
Sodium: 143 mmol/L (ref 135–145)
Total Bilirubin: 0.8 mg/dL (ref 0.3–1.2)
Total Protein: 6.2 g/dL — ABNORMAL LOW (ref 6.5–8.1)

## 2021-01-06 LAB — IRON AND TIBC
Iron: 31 ug/dL (ref 28–170)
Saturation Ratios: 12 % (ref 10.4–31.8)
TIBC: 270 ug/dL (ref 250–450)
UIBC: 239 ug/dL

## 2021-01-06 LAB — GLUCOSE, CAPILLARY
Glucose-Capillary: 191 mg/dL — ABNORMAL HIGH (ref 70–99)
Glucose-Capillary: 300 mg/dL — ABNORMAL HIGH (ref 70–99)
Glucose-Capillary: 209 mg/dL — ABNORMAL HIGH (ref 70–99)
Glucose-Capillary: 250 mg/dL — ABNORMAL HIGH (ref 70–99)

## 2021-01-06 LAB — TRANSFERRIN: Transferrin: 185 mg/dL — ABNORMAL LOW (ref 192–382)

## 2021-01-06 LAB — FERRITIN: Ferritin: 403 ng/mL — ABNORMAL HIGH (ref 11–307)

## 2021-01-06 MED ORDER — ONDANSETRON HCL 4 MG/2ML IJ SOLN
4.0000 mg | Freq: Four times a day (QID) | INTRAMUSCULAR | Status: DC | PRN
  Administered 2021-01-06 – 2021-01-10 (×3): 4 mg via INTRAVENOUS
  Filled 2021-01-06 (×3): qty 2

## 2021-01-06 MED ORDER — CARVEDILOL 6.25 MG PO TABS
12.5000 mg | ORAL_TABLET | Freq: Two times a day (BID) | ORAL | Status: DC
  Administered 2021-01-06 – 2021-01-15 (×18): 12.5 mg via ORAL
  Filled 2021-01-06: qty 4
  Filled 2021-01-06 (×7): qty 2
  Filled 2021-01-06: qty 4
  Filled 2021-01-06 (×2): qty 2
  Filled 2021-01-06: qty 4
  Filled 2021-01-06: qty 2
  Filled 2021-01-06: qty 4
  Filled 2021-01-06: qty 2
  Filled 2021-01-06: qty 4
  Filled 2021-01-06: qty 2

## 2021-01-06 MED ORDER — INSULIN GLARGINE-YFGN 100 UNIT/ML ~~LOC~~ SOLN
8.0000 [IU] | Freq: Every day | SUBCUTANEOUS | Status: DC
  Administered 2021-01-06: 8 [IU] via SUBCUTANEOUS
  Filled 2021-01-06 (×2): qty 0.08

## 2021-01-06 MED ORDER — PANTOPRAZOLE SODIUM 40 MG PO TBEC
40.0000 mg | DELAYED_RELEASE_TABLET | Freq: Every day | ORAL | Status: DC
  Administered 2021-01-06 – 2021-01-08 (×3): 40 mg via ORAL
  Filled 2021-01-06 (×3): qty 1

## 2021-01-06 MED ORDER — AMLODIPINE BESYLATE 5 MG PO TABS
5.0000 mg | ORAL_TABLET | Freq: Once | ORAL | Status: AC
  Administered 2021-01-06: 5 mg via ORAL
  Filled 2021-01-06: qty 1

## 2021-01-06 MED ORDER — PIPERACILLIN-TAZOBACTAM 3.375 G IVPB
3.3750 g | Freq: Three times a day (TID) | INTRAVENOUS | Status: AC
  Administered 2021-01-06 – 2021-01-08 (×7): 3.375 g via INTRAVENOUS
  Filled 2021-01-06 (×7): qty 50

## 2021-01-06 MED ORDER — SODIUM CHLORIDE 0.9 % IV SOLN
1.0000 g | Freq: Two times a day (BID) | INTRAVENOUS | Status: DC
  Administered 2021-01-06: 10:00:00 1 g via INTRAVENOUS
  Filled 2021-01-06 (×2): qty 1

## 2021-01-06 MED ORDER — AMLODIPINE BESYLATE 10 MG PO TABS
10.0000 mg | ORAL_TABLET | Freq: Every day | ORAL | Status: DC
  Administered 2021-01-07 – 2021-01-15 (×9): 10 mg via ORAL
  Filled 2021-01-06 (×9): qty 1

## 2021-01-06 NOTE — Progress Notes (Signed)
[facility], [place] 01/06/21  Subjective:   LOS: 4 10/30 0701 - 10/31 0700 In: 1560.2 [I.V.:1132.8; IV Piggyback:427.4] Out: 2150 [Urine:2150] Patient known to our practice from outpatient follow-up Presented to the emergency room for high blood sugar and confusion. She is a [facility]  Patient seen sitting up in bed Alert and oriented Tolerating small meals, but continues to complain of nausea No vomiting  Creatinine improving   Objective:  Vital signs in last 24 hours:  Temp:  [98.3 F (36.8 C)-99.3 F (37.4 C)] 98.9 F (37.2 C) (10/31 1114) Pulse Rate:  [76-95] 95 (10/31 1114) Resp:  [17-20] 20 (10/31 1114) BP: (136-181)/(53-70) 181/70 (10/31 1114) SpO2:  [95 %-100 %] 95 % (10/31 1114)  Weight change:  Filed Weights   01/02/21 1451 01/03/21 1957 01/03/21 1957  Weight: 110.3 kg 109.3 kg 109.3 kg    Intake/Output:    Intake/Output Summary (Last 24 hours) at 01/06/2021 1126 Last data filed at 01/06/2021 0708 Gross per 24 hour  Intake 1560.21 ml  Output 1350 ml  Net 210.21 ml     Physical Exam: General:  No acute distress, laying in the bed  HEENT  anicteric, moist oral mucous membrane  Pulm/lungs  normal breathing effort, lungs are clear to auscultation  CVS/Heart  regular rhythm, no rub or gallop  Abdomen:   Soft, nontender  Extremities:  No peripheral edema  Neurologic:  Alert, oriented, able to follow commands  Skin:  No acute rashes    Basic Metabolic Panel:  Recent Labs  Lab 01/02/21 1504 01/02/21 2227 01/03/21 0626 01/03/21 1658 01/04/21 0500 01/05/21 0439 01/06/21 0535  NA 136  --  139  --  136 139 143  K 3.2*  --  4.7  --  3.5 3.6 3.7  CL 94*  --  99  --  99 104 109  CO2 28  --  24  --  26 24 23   GLUCOSE 330*  --  307*  --  125* 121* 169*  BUN 64*  --  64*  --  77* 65* 43*  CREATININE 2.85* 2.83* 2.75*  --  3.59* 2.47* 1.62*  CALCIUM 11.4*  --  11.1*  --  10.5* 10.3 10.6*  PHOS  --    --   --  4.2  --   --   --       CBC: Recent Labs  Lab 01/02/21 1504 01/02/21 2227 01/04/21 0500 01/05/21 0439 01/06/21 0535  WBC 8.6 8.9 7.0  --   --   HGB 8.3* 8.1* 7.7* 7.8* 8.3*  HCT 25.5* 24.7* 24.5*  --   --   MCV 103.2* 104.2* 102.9*  --   --   PLT 190 173 180  --   --       No results found for: HEPBSAG, HEPBSAB, HEPBIGM    Microbiology:  Recent Results (from the past 240 hour(s))  Resp Panel by RT-PCR (Flu A&B, Covid) Nasopharyngeal Swab     Status: Abnormal   Collection Time: 01/02/21  4:55 PM   Specimen: Nasopharyngeal Swab; Nasopharyngeal(NP) swabs in vial transport medium  Result Value Ref Range Status   SARS Coronavirus 2 by RT PCR POSITIVE (A) NEGATIVE Corrected    Comment: RESULT CALLED TO, READ BACK BY AND VERIFIED WITH: HINA HELSEL 1921 01/02/21 MU (NOTE) SARS-CoV-2 target nucleic acids are DETECTED.  The SARS-CoV-2 RNA is generally detectable in upper respiratory specimens during the acute phase of infection. Positive results are  indicative of the presence of the identified virus, but do not rule out bacterial infection or co-infection with other pathogens not detected by the test. Clinical correlation with patient history and other diagnostic information is necessary to determine patient infection status. The expected result is Negative.  Fact Sheet for Patients: EntrepreneurPulse.com.au  Fact Sheet for Healthcare Providers: IncredibleEmployment.be  This test is not yet approved or cleared by the [place] FDA and  has been authorized for detection and/or diagnosis of SARS-CoV-2 by FDA under an Emergency Use Authorization (EUA).  This EUA will remain in effect (meaning this test can be used ) for the duration of  the COVID-19 declaration under Section 564(b)(1) of the Act, 21 U.S.C. section 360bbb-3(b)(1), unless the authorization is terminated or revoked sooner.  CORRECTED ON 10/27 AT 1952:  PREVIOUSLY REPORTED AS POSITIVE CRITICAL RESULT CALLED TO, READ BACK BY AND VERIFIED WITH: Chical 01/02/21 MU    Influenza A by PCR NEGATIVE NEGATIVE Final   Influenza B by PCR NEGATIVE NEGATIVE Final    Comment: (NOTE) The Xpert Xpress SARS-CoV-2/FLU/RSV plus assay is intended as an aid in the diagnosis of influenza from Nasopharyngeal swab specimens and should not be used as a sole basis for treatment. Nasal washings and aspirates are unacceptable for Xpert Xpress SARS-CoV-2/FLU/RSV testing.  Fact Sheet for Patients: EntrepreneurPulse.com.au  Fact Sheet for Healthcare Providers: IncredibleEmployment.be  This test is not yet approved or cleared by the [place] FDA and has been authorized for detection and/or diagnosis of SARS-CoV-2 by FDA under an Emergency Use Authorization (EUA). This EUA will remain in effect (meaning this test can be used) for the duration of the COVID-19 declaration under Section 564(b)(1) of the Act, 21 U.S.C. section 360bbb-3(b)(1), unless the authorization is terminated or revoked.  Performed at [facility], [address], [place], [place] [address]   Urine Culture     Status: Abnormal   Collection Time: 01/02/21  6:00 PM   Specimen: Urine, Clean Catch  Result Value Ref Range Status   Specimen Description   Final    URINE, CLEAN CATCH Performed at [facility], [address] [place], [place] [address]    Special Requests   Final    NONE Performed at [facility], [address] [place], [place] [address]    Culture (A)  Final    >=100,000 COLONIES/mL KLEBSIELLA PNEUMONIAE Confirmed Extended Spectrum Beta-Lactamase Producer (ESBL).  In bloodstream infections from ESBL organisms, carbapenems are preferred over piperacillin/tazobactam. They are shown to have a lower risk of mortality.    Report Status 01/04/2021 FINAL  Final   Organism ID, Bacteria KLEBSIELLA  PNEUMONIAE (A)  Final      Susceptibility   Klebsiella pneumoniae - MIC*    AMPICILLIN >=32 RESISTANT Resistant     CEFAZOLIN >=64 RESISTANT Resistant     CEFEPIME >=32 RESISTANT Resistant     CEFTRIAXONE >=64 RESISTANT Resistant     CIPROFLOXACIN 0.5 INTERMEDIATE Intermediate     GENTAMICIN <=1 SENSITIVE Sensitive     IMIPENEM <=0.25 SENSITIVE Sensitive     NITROFURANTOIN 64 INTERMEDIATE Intermediate     TRIMETH/SULFA >=320 RESISTANT Resistant     AMPICILLIN/SULBACTAM 16 INTERMEDIATE Intermediate     PIP/TAZO <=4 SENSITIVE Sensitive     * >=100,000 COLONIES/mL KLEBSIELLA PNEUMONIAE    Coagulation Studies: No results for input(s): LABPROT, INR in the last 72 hours.  Urinalysis: No results for input(s): COLORURINE, LABSPEC, [place], [place], [place], [place], [place], [place], [place], NITRITE, LEUKOCYTESUR  in the last 72 hours.  Invalid input(s): APPERANCEUR     Imaging: No results found.   Medications:    sodium chloride 75 mL/hr at 01/06/21 0645   meropenem (MERREM) IV 1 g (01/06/21 1009)    allopurinol  100 mg Oral Daily   amLODipine  5 mg Oral Daily   vitamin C  500 mg Oral Daily   aspirin EC  81 mg Oral Daily   Chlorhexidine Gluconate Cloth  6 each Topical Daily   cholecalciferol  1,000 Units Oral Daily   ferrous sulfate  325 mg Oral Daily   gemfibrozil  600 mg Oral Daily   heparin  5,000 Units Subcutaneous Q8H   insulin aspart  0-15 Units Subcutaneous TID WC   insulin aspart  3 Units Subcutaneous TID WC   ipratropium  2 puff Inhalation Q6H   latanoprost  1 drop Both Eyes QHS   multivitamin with minerals  1 tablet Oral Daily   pantoprazole  40 mg Oral QHS   polyethylene glycol  17 g Oral Daily   Ensure Max Protein  237 mL Oral BID BM   senna  1 tablet Oral QHS   zinc sulfate  220 mg Oral Daily   albuterol, guaiFENesin-dextromethorphan, ondansetron (ZOFRAN) IV, ondansetron  Assessment/ Plan:  80 y.o. female with   Principal Problem:   AMS  (altered mental status) Active Problems:   Anemia due to stage 3 chronic kidney disease (HCC)   Diabetes (HCC)   Essential hypertension   GERD (gastroesophageal reflux disease)   IDA (iron deficiency anemia)   COVID-19 virus infection   Acute respiratory failure with hypoxia (HCC)   Urinary tract infection due to ESBL Klebsiella   Primary hyperparathyroidism ([place])   CKD stage 4 due to type 2 diabetes mellitus ([place])   Secondary hyperparathyroidism ([place])   #.  Acute kidney injury on CKD st 3B, baseline appears to be a creatinine 1.31 and GFR 41 on 12/23/20 Recent Labs    01/03/21 0626 01/04/21 0500 01/05/21 0439 01/06/21 0535  CREATININE 2.75* 3.59* 2.47* 1.62*   CKD risk factors include advanced age, diabetes, hypertension AKI likely secondary to ATN from concurrent illness including UTI and COVID Creatinine improved with IVF Continue supportive care  #. Anemia of CKD  Lab Results  Component Value Date   HGB 8.3 (L) 01/06/2021   Agree with oral iron supplementation Iron studies indicate chronic inflammation with elevated ferritin and transferrin    #. SHPTH with hypercalcemia     Component Value Date/Time   PTH 99 (H) 01/03/2021 0727   Lab Results  Component Value Date   PHOS 4.2 01/03/2021  Elevated calcium, discontinued calcitriol    #. Diabetes type 2 with CKD Hgb A1c MFr Bld (%)  Date Value  01/02/2021 9.0 (H)  Blood sugar elevated this am May be a candidate for SGLT2 inhibitor as outpatient  #Urinary tract infection with Klebsiella pneumoniae Remains on IV meropenem  #SARS-CoV-2 + January 02, 2021 Currently getting treatment with IV remdesivir    LOS: [place] 10/31/202211:26 [place], [place] patient is known

## 2021-01-06 NOTE — Evaluation (Signed)
Occupational Therapy Evaluation Patient Details Name: Sara Bright MRN: [id number] DOB: 08-31-1940 Today's Date: 01/06/2021   History of Present Illness Sara Bright is a 80 y.o. female with history of hypertension, CKD, diabetes, OSA on CPAP who comes in with concerns for hyperglycemia and lethargy.   Clinical Impression   Pt was seen for OT evaluation this date. Prior to hospital admission, pt was at [facility] for a few days after [facility] hospitalization. With prompting, pt endorses that at [facility] staff were using a lift with her but then states that she was only able to take a couple steps. No family present to verify information. Pt received in bed attempting to use call bell but not able to independently. Pt expressed need to use the bathroom and agreeable to trial getting to [facility] and required MAX A for donning socks and MOD A to get sitting on EOB. Pt attempted to perform ADL transfer + RW to [facility] but ultimately was unable to clear buttocks off bed despite elevated bed, MAX VC for RW mgt/hand/foot placement. Pt returned to supine with MAX A for BLE mgt. Pt log rolled to L side (her preference stating it was easier to roll to L) requiring MIN-MOD A and bed pan placed. Nurse tech called for assist for pericare and full linens change while pt required CGA-MIN A to maintain sidelying.   Currently pt demonstrates impairments as described below (See OT problem list) which functionally limit her ability to perform ADL/self-care tasks. Pt currently requires +2 assist for mobility attempts, MAX A for LB ADL, and MAX-TOTAL A for toileting.  Pt would benefit from skilled OT services to address noted impairments and functional limitations (see below for any additional details) in order to maximize safety and independence while minimizing falls risk and caregiver burden. Upon hospital discharge, recommend STR to maximize pt safety and return to PLOF.     Recommendations for follow up therapy are one component of  a multi-disciplinary discharge planning process, led by the attending physician.  Recommendations may be updated based on patient status, additional functional criteria and insurance authorization.   Follow Up Recommendations  Skilled nursing-short term rehab (<3 hours/day)    Assistance Recommended at Discharge    Functional Status Assessment  Patient has had a recent decline in their functional status and demonstrates the ability to make significant improvements in function in a reasonable and predictable amount of time.  Equipment Recommendations  Other (comment) (defer to next venue - likely will require bariatric BSC)    Recommendations for Other Services       Precautions / Restrictions Precautions Precautions: Fall Restrictions Weight Bearing Restrictions: Yes RLE Weight Bearing: Weight bearing as tolerated      Mobility Bed Mobility Overal bed mobility: Needs Assistance Bed Mobility: Supine to Sit;Sit to Supine;Rolling Rolling: Min assist;Mod assist   Supine to sit: HOB elevated;Mod assist Sit to supine: Max assist        Transfers Overall transfer level: Needs assistance Equipment used: Rolling walker (2 wheels) Transfers: Sit to/from Stand Sit to Stand: Total assist;From elevated surface           General transfer comment: ultimately unable to clear buttocks off bed, MAX VC for hand/foot placement/RW mgt      Balance Overall balance assessment: Needs assistance Sitting-balance support: Single extremity supported;Feet supported Sitting balance-Leahy Scale: Fair       Standing balance-Leahy Scale: Zero  ADL either performed or assessed with clinical judgement   ADL Overall ADL's : Needs assistance/impaired                                       General ADL Comments: Pt required set up for self feeding in long sitting in bed, MAX A for LB ADL, MIN A for UB ADL, TOTAL A for pericare in sidelying  after toileting, and will require heavy +2 assist for ADL transfer attempts.     Vision         Perception     Praxis      Pertinent Vitals/Pain Pain Assessment: No/denies pain     Hand Dominance Right   Extremity/Trunk Assessment Upper Extremity Assessment Upper Extremity Assessment: Generalized weakness   Lower Extremity Assessment Lower Extremity Assessment: Generalized weakness (hx of BLE lymphedema per chart, per PT eval, pt/dtr note hx of bone fractures in R foot, but WBAT)       Communication Communication Communication: No difficulties   Cognition Arousal/Alertness: Awake/alert Behavior During Therapy: WFL for tasks assessed/performed Overall Cognitive Status: No family/caregiver present to determine baseline cognitive functioning                                 General Comments: alert and oriented, requires cues for safety when attempting transfer     General Comments       Exercises Other Exercises Other Exercises: Pt instructed in RW mgt, ADL transfer training   Shoulder Instructions      Home Living Family/patient expects to be discharged to:: Skilled nursing facility (PT was at [facility] for a couple days after [facility] hospitalization; was at home prior to that)                                        Prior Functioning/Environment               Mobility Comments: since [facility] hospitalization, has not been able to take more than a couple steps (presumably with therapy, no family to verify) but then states that at [facility] they were using a lift with her ADLs Comments: Pt was requiring significant assist for ADL since recent hospitalization        OT Problem List: Decreased strength;Decreased safety awareness;Decreased activity tolerance;Impaired balance (sitting and/or standing);Decreased knowledge of use of DME or AE;Obesity      OT Treatment/Interventions: Self-care/ADL training;Therapeutic exercise;Therapeutic  activities;DME and/or AE instruction;Patient/family education;Balance training    OT Goals(Current goals can be found in the care plan section) Acute Rehab OT Goals Patient Stated Goal: go home OT Goal Formulation: With patient Time For Goal Achievement: 01/20/21 Potential to Achieve Goals: Fair ADL Goals Pt Will Transfer to Toilet: with max assist;stand pivot transfer;bedside commode (LRAD) Additional ADL Goal #1: Pt will perform bed mobility with MIN A in anticipation of seated ADL. Additional ADL Goal #2: Pt will participate in seated UB bathing/dressing wiht set up and supervision.  OT Frequency: Min 1X/week   Barriers to D/C:            Co-evaluation              AM-PAC OT "6 Clicks" Daily Activity     Outcome Measure Help from another  person eating meals?: None Help from another person taking care of personal grooming?: A Little Help from another person toileting, which includes using toliet, bedpan, or urinal?: Total Help from another person bathing (including washing, rinsing, drying)?: A Lot Help from another person to put on and taking off regular upper body clothing?: A Little Help from another person to put on and taking off regular lower body clothing?: A Lot 6 Click Score: 15   End of Session Nurse Communication: Other (comment) (nurse tech came to assist)  Activity Tolerance: Patient tolerated treatment well Patient left: in bed;with call bell/phone within reach;with bed alarm set  OT Visit Diagnosis: Other abnormalities of gait and mobility (R26.89);Muscle weakness (generalized) (M62.81)                Time: 3295-1884 OT Time Calculation (min): 32 min Charges:  OT General Charges $OT Visit: 1 Visit OT Evaluation $OT Eval Moderate Complexity: 1 Mod OT Treatments $Self Care/Home Management : 23-37 mins  Ardeth Perfect., MPH, MS, OTR/L ascom [phone] 01/06/21, 9:42 AM

## 2021-01-06 NOTE — Progress Notes (Signed)
PT Cancellation Note  Patient Details Name: Sara Bright MRN: [id number] DOB: 1940/04/21   Cancelled Treatment:    Reason Eval/Treat Not Completed: Other (comment) Pt out of room, just coming back, stating fatigue. PT to reassess as able.   [employer], SPT

## 2021-01-06 NOTE — Progress Notes (Addendum)
Patient ID: Sara Bright, female   DOB: 17-May-1940, 80 y.o.   MRN: [id number] [facility] PROGRESS NOTE  Sara Bright TML:[id number] DOB: 1940-11-06 DOA: 01/02/2021 PCP: Anthonette Legato, MD  HPI/Subjective: Patient with slurred speech continued today.  In speaking with the patient's daughter speech was fine earlier today.  She states that has been intermittent over time.  Normally can have a stutter.  Objective: Vitals:   01/06/21 1114 01/06/21 1549  BP: (!) 181/70 (!) 173/63  Pulse: 95 83  Resp: 20 18  Temp: 98.9 F (37.2 C) 98.6 F (37 C)  SpO2: 95% 99%    Intake/Output Summary (Last 24 hours) at 01/06/2021 1628 Last data filed at 01/06/2021 1615 Gross per 24 hour  Intake 2552.28 ml  Output 1350 ml  Net 1202.28 ml   Filed Weights   01/02/21 1451 01/03/21 1957 01/03/21 1957  Weight: 110.3 kg 109.3 kg 109.3 kg    ROS: Review of Systems  Respiratory:  Negative for shortness of breath.   Cardiovascular:  Negative for chest pain.  Gastrointestinal:  Negative for abdominal pain, nausea and vomiting.  Neurological:  Positive for speech change.  Exam: Physical Exam HENT:     Head: Normocephalic.     Mouth/Throat:     Pharynx: No oropharyngeal exudate.  Eyes:     General: Lids are normal.     Conjunctiva/sclera: Conjunctivae normal.  Cardiovascular:     Rate and Rhythm: Normal rate and regular rhythm.     Heart sounds: Normal heart sounds, S1 normal and S2 normal.  Pulmonary:     Breath sounds: Normal breath sounds. No decreased breath sounds, wheezing, rhonchi or rales.  Abdominal:     Palpations: Abdomen is soft.     Tenderness: There is no abdominal tenderness.  Musculoskeletal:     Right lower leg: Swelling present.     Left lower leg: Swelling present.  Skin:    General: Skin is warm.     Findings: No rash.  Neurological:     Mental Status: She is alert and oriented to person, place, and time.     Comments: Answers questions appropriately.       Scheduled Meds:  allopurinol  100 mg Oral Daily   [START ON 01/07/2021] amLODipine  10 mg Oral Daily   vitamin C  500 mg Oral Daily   aspirin EC  81 mg Oral Daily   Chlorhexidine Gluconate Cloth  6 each Topical Daily   cholecalciferol  1,000 Units Oral Daily   ferrous sulfate  325 mg Oral Daily   gemfibrozil  600 mg Oral Daily   heparin  5,000 Units Subcutaneous Q8H   insulin aspart  0-15 Units Subcutaneous TID WC   insulin aspart  3 Units Subcutaneous TID WC   ipratropium  2 puff Inhalation Q6H   latanoprost  1 drop Both Eyes QHS   multivitamin with minerals  1 tablet Oral Daily   pantoprazole  40 mg Oral QHS   polyethylene glycol  17 g Oral Daily   Ensure Max Protein  237 mL Oral BID BM   senna  1 tablet Oral QHS   zinc sulfate  220 mg Oral Daily   Continuous Infusions:  sodium chloride 75 mL/hr at 01/06/21 1615   piperacillin-tazobactam (ZOSYN)  IV      Assessment/Plan:  ESBL Klebsiella with acute cystitis with hematuria.  We will change meropenem over to Zosyn for completion of 5 days of antibiotic. Acute metabolic encephalopathy with intermittent slurring  of words.  Case discussed with neurology and they reviewed MRI of the brain and does not show acute event.  He reviewed prior MRIs that showed that the patient does have collaterals of the left ICA.  Repeat CT scan of the head negative.  We will get swallow and speech evaluation.  We will send off acetylcholine receptor antibiotics. COVID-19 infection.  Completed remdesivir and continue airborne isolation. Acute kidney injury on chronic kidney disease stage IIIb.  Creatinine peaked at 3.59 and improved to 1.62 with IV fluid hydration. Secondary hyperparathyroidism with hypercalcemia and vitamin D deficiency.  Calcium 10.6 today.  Continue supplementation with vitamin D Type 2 diabetes mellitus with chronic kidney disease stage IIIb hemoglobin A1c elevated 9.0.  We will add low-dose Lantus. Essential hypertension.   Increasing Norvasc to 10 mg today.  We will add back Coreg. Previous fractures bones of right foot.  Needs to get better with physical therapy in order to go home Hypertriglyceridemia on gemfibrozil Anemia of chronic disease.  Hemoglobin today 8.3 GERD on PPI Glaucoma on latanoprost        Code Status:     Code Status Orders  (From admission, onward)           Start     Ordered   01/02/21 2120  Full code  Continuous        01/02/21 2142           Code Status History     This patient has a current code status but no historical code status.      Advance Directive Documentation    Flowsheet Row Most Recent Value  Type of Advance Directive Healthcare Power of Attorney  Pre-existing out of facility DNR order (yellow form or pink MOST form) --  "MOST" Form in Place? --      Family Communication: Spoke with patient's daughter on the phone Disposition Plan: Status is: Inpatient  Consultants: Neurology  Antibiotics: Zosyn  Time spent: 28 minutes  [place]

## 2021-01-07 DIAGNOSIS — U071 COVID-19: Secondary | ICD-10-CM | POA: Diagnosis not present

## 2021-01-07 DIAGNOSIS — N390 Urinary tract infection, site not specified: Secondary | ICD-10-CM | POA: Diagnosis not present

## 2021-01-07 DIAGNOSIS — R4781 Slurred speech: Secondary | ICD-10-CM | POA: Diagnosis not present

## 2021-01-07 DIAGNOSIS — G9341 Metabolic encephalopathy: Secondary | ICD-10-CM | POA: Diagnosis not present

## 2021-01-07 DIAGNOSIS — R609 Edema, unspecified: Secondary | ICD-10-CM

## 2021-01-07 LAB — GLUCOSE, CAPILLARY
Glucose-Capillary: 164 mg/dL — ABNORMAL HIGH (ref 70–99)
Glucose-Capillary: 239 mg/dL — ABNORMAL HIGH (ref 70–99)
Glucose-Capillary: 286 mg/dL — ABNORMAL HIGH (ref 70–99)
Glucose-Capillary: 257 mg/dL — ABNORMAL HIGH (ref 70–99)

## 2021-01-07 LAB — COMPREHENSIVE METABOLIC PANEL
ALT: 14 U/L (ref 0–44)
AST: 25 U/L (ref 15–41)
Albumin: 2.4 g/dL — ABNORMAL LOW (ref 3.5–5.0)
Alkaline Phosphatase: 48 U/L (ref 38–126)
Anion gap: 7 (ref 5–15)
BUN: 29 mg/dL — ABNORMAL HIGH (ref 8–23)
CO2: 25 mmol/L (ref 22–32)
Calcium: 10.2 mg/dL (ref 8.9–10.3)
Chloride: 109 mmol/L (ref 98–111)
Creatinine, Ser: 1.39 mg/dL — ABNORMAL HIGH (ref 0.44–1.00)
GFR, Estimated: 38 mL/min — ABNORMAL LOW (ref >60)
Glucose, Bld: 187 mg/dL — ABNORMAL HIGH (ref 70–99)
Potassium: 3.8 mmol/L (ref 3.5–5.1)
Sodium: 141 mmol/L (ref 135–145)
Total Bilirubin: 0.8 mg/dL (ref 0.3–1.2)
Total Protein: 5.7 g/dL — ABNORMAL LOW (ref 6.5–8.1)

## 2021-01-07 MED ORDER — INSULIN GLARGINE-YFGN 100 UNIT/ML ~~LOC~~ SOLN
12.0000 [IU] | Freq: Every day | SUBCUTANEOUS | Status: DC
  Administered 2021-01-07 – 2021-01-11 (×5): 12 [IU] via SUBCUTANEOUS
  Filled 2021-01-07 (×6): qty 0.12

## 2021-01-07 MED ORDER — ORAL CARE MOUTH RINSE
15.0000 mL | Freq: Two times a day (BID) | OROMUCOSAL | Status: DC
  Administered 2021-01-07 – 2021-01-15 (×15): 15 mL via OROMUCOSAL

## 2021-01-07 MED ORDER — INSULIN ASPART 100 UNIT/ML IJ SOLN
4.0000 [IU] | Freq: Three times a day (TID) | INTRAMUSCULAR | Status: DC
  Administered 2021-01-07 – 2021-01-12 (×14): 4 [IU] via SUBCUTANEOUS
  Filled 2021-01-07 (×12): qty 1

## 2021-01-07 NOTE — NC FL2 (Signed)
[place] LEVEL OF CARE SCREENING TOOL     IDENTIFICATION  Patient Name: Sara Bright Birthdate: Aug 12, 1940 Sex: female Admission Date (Current Location): 01/02/2021  [facility] and [place] Number:  Engineering geologist and Address:  [facility], [address] [place], [place] [address]      Provider Number: [id number]  Attending Physician Name and Address:  Loletha Grayer, MD  Relative Name and Phone Number:  Holmes,Charlotte (Daughter)   [phone] (Mobile)    Current Level of Care: Hospital Recommended Level of Care: [place] Prior Approval Number:    Date Approved/Denied:   PASRR Number: [id number]  Discharge Plan: SNF    Current Diagnoses: Patient Active Problem List   Diagnosis Date Noted   Swelling    Slurred speech    Anemia of chronic disease    Secondary hyperparathyroidism ([place])    Urinary tract infection due to ESBL Klebsiella    Primary hyperparathyroidism ([place])    CKD stage 4 due to type 2 diabetes mellitus ([place])    Acute metabolic encephalopathy    Acute cystitis with hematuria    Acute kidney injury superimposed on CKD ([place])    Hypercalcemia    AMS (altered mental status) 01/02/2021   COVID-19 virus infection 01/02/2021   Acute respiratory failure with hypoxia ([place]) 01/02/2021   Acute idiopathic gout of left foot 06/20/2019   Edema of lower extremity 03/19/2019   Hyperkalemia 03/19/2019   GERD (gastroesophageal reflux disease) 01/20/2019   Hyperlipidemia 01/20/2019   Claudication ([place]) 01/07/2019   Venous ulcer of left leg ([place]) 08/13/2018   Diabetic polyneuropathy associated with type 2 diabetes mellitus ([place]) 05/30/2018   Macrocytosis 04/26/2018   Fecal occult blood test positive 10/08/2017   IDA (iron deficiency anemia) 10/08/2017   Chronic venous insufficiency 12/25/2016   Lymphedema 12/25/2016   Diabetes ([place]) 12/25/2016   Essential hypertension 12/25/2016   Hx of  adenomatous colonic polyps 10/22/2016   DOE (dyspnea on exertion) 07/22/2016   CKD stage 3 due to type 2 diabetes mellitus ([place]) 06/23/2016   Aortic atherosclerosis ([place]) 03/09/2016   Asthma with chronic obstructive pulmonary disease (COPD) ([place]) 03/09/2016   Swelling of limb 12/31/2015   Severe obesity (BMI >= 40) ([place]) 12/31/2015   Varicose veins of both legs with edema 12/31/2015   VV (varicose veins) 11/27/2015   Other iron deficiency anemia 10/08/2015   Anemia due to stage 3 chronic kidney disease ([place]) 10/08/2015   BMI 45.0-49.9, adult ([place]) 10/05/2015   OSA on CPAP 03/10/2015   Lumbar stenosis with neurogenic claudication 09/30/2011    Orientation RESPIRATION BLADDER Height & Weight     Situation, Self, Place  Normal (100 on Room air) Incontinent Weight: 109.3 kg Height:  5\' 4"  (162.6 cm)  BEHAVIORAL SYMPTOMS/MOOD NEUROLOGICAL BOWEL NUTRITION STATUS      Incontinent Diet (DYS 2)  AMBULATORY STATUS COMMUNICATION OF NEEDS Skin   Extensive Assist Verbally Skin abrasions (R buttocks with foam dressing)                       Personal Care Assistance Level of Assistance  Bathing, Feeding, Dressing Bathing Assistance: Maximum assistance Feeding assistance: Limited assistance Dressing Assistance: Maximum assistance     Functional Limitations Info  Sight, Hearing, Speech Sight Info: Adequate Hearing Info: Impaired Speech Info: Adequate    SPECIAL CARE FACTORS FREQUENCY  PT (By licensed PT), OT (By licensed OT)     PT Frequency: MIn 5x weekly OT  Frequency: Min 5x weeklyl            Contractures Contractures Info: Not present    Additional Factors Info  Code Status, Allergies Code Status Info: FULL CODE Allergies Info: Aspirin   Hydrocodone-acetaminophen Not Specified Other (See Comments) Sedation and GI upset, pt is not allergic to acetaminophen  Statins  Tramadol-acetaminophen           Current Medications (01/07/2021):  This is the current hospital  active medication list Current Facility-Administered Medications  Medication Dose Route Frequency Provider Last Rate Last Admin   albuterol (VENTOLIN HFA) 108 (90 Base) MCG/ACT inhaler 2 puff  2 puff Inhalation Q6H PRN Wieting, Richard, MD       allopurinol (ZYLOPRIM) tablet 100 mg  100 mg Oral Daily Loletha Grayer, MD   100 mg at 01/07/21 0922   amLODipine (NORVASC) tablet 10 mg  10 mg Oral Daily Loletha Grayer, MD   10 mg at 01/07/21 4580   ascorbic acid (VITAMIN C) tablet 500 mg  500 mg Oral Daily Para Skeans, MD   500 mg at 01/07/21 9983   aspirin EC tablet 81 mg  81 mg Oral Daily Loletha Grayer, MD   81 mg at 01/07/21 0923   carvedilol (COREG) tablet 12.5 mg  12.5 mg Oral BID WC Loletha Grayer, MD   12.5 mg at 01/07/21 3825   Chlorhexidine Gluconate Cloth 2 % PADS 6 each  6 each Topical Daily Sharion Settler, NP   6 each at 01/07/21 0539   cholecalciferol (VITAMIN D3) tablet 1,000 Units  1,000 Units Oral Daily Loletha Grayer, MD   1,000 Units at 01/07/21 7673   ferrous sulfate tablet 325 mg  325 mg Oral Daily Loletha Grayer, MD   325 mg at 01/07/21 4193   gemfibrozil (LOPID) tablet 600 mg  600 mg Oral Daily Loletha Grayer, MD   600 mg at 01/07/21 0923   guaiFENesin-dextromethorphan (ROBITUSSIN DM) 100-10 MG/5ML syrup 10 mL  10 mL Oral Q4H PRN Para Skeans, MD       heparin injection 5,000 Units  5,000 Units Subcutaneous Q8H Florina Ou V, MD   5,000 Units at 01/07/21 1349   insulin aspart (novoLOG) injection 0-15 Units  0-15 Units Subcutaneous TID WC Florina Ou V, MD   5 Units at 01/07/21 1350   insulin aspart (novoLOG) injection 4 Units  4 Units Subcutaneous TID WC Loletha Grayer, MD   4 Units at 01/07/21 1350   insulin glargine-yfgn (SEMGLEE) injection 12 Units  12 Units Subcutaneous QHS Wieting, Richard, MD       ipratropium (ATROVENT HFA) inhaler 2 puff  2 puff Inhalation Q6H Florina Ou V, MD   2 puff at 01/07/21 1349   latanoprost (XALATAN) 0.005 % ophthalmic  solution 1 drop  1 drop Both Eyes QHS Loletha Grayer, MD   1 drop at 01/06/21 2149   multivitamin with minerals tablet 1 tablet  1 tablet Oral Daily Loletha Grayer, MD   1 tablet at 01/07/21 0922   ondansetron (ZOFRAN) injection 4 mg  4 mg Intravenous Q6H PRN Loletha Grayer, MD   4 mg at 01/07/21 0919   ondansetron (ZOFRAN) tablet 4 mg  4 mg Oral Q8H PRN Loletha Grayer, MD   4 mg at 01/03/21 1411   pantoprazole (PROTONIX) EC tablet 40 mg  40 mg Oral QHS Dorothe Pea, RPH   40 mg at 01/06/21 2149   piperacillin-tazobactam (ZOSYN) IVPB 3.375 g  3.375 g Intravenous Q8H Wieting, Richard,  MD 12.5 mL/hr at 01/07/21 1355 3.375 g at 01/07/21 1355   protein supplement (ENSURE MAX) liquid  237 mL Oral BID BM Murlean Iba, MD   237 mL at 01/07/21 0925   zinc sulfate capsule 220 mg  220 mg Oral Daily Para Skeans, MD   220 mg at 01/07/21 6002     Discharge Medications: Please see discharge summary for a list of discharge medications.  Relevant Imaging Results:  Relevant Lab Results:   Additional Information SSN [id number]  Pete Pelt, RN

## 2021-01-07 NOTE — Progress Notes (Signed)
Physical Therapy Treatment Patient Details Name: Sara Bright MRN: [id number] DOB: 04-24-1940 Today's Date: 01/07/2021   History of Present Illness Sara Bright is a 80 y.o. female with history of hypertension, CKD, diabetes, OSA on CPAP who comes in with concerns for hyperglycemia and lethargy.  H/o R foot fx's.    PT Comments    Pt resting in bed upon PT arrival; agreeable to PT session.  Tolerated B LE ex's in bed fairly well with assist as needed.  D/t h/o broken bones within R foot, R LE boot donned for OOB mobility (pt WBAT per initial PT eval and per pt report).  Max assist with bed mobility (supine to/from sitting); able to clear pt's bottom from bed attempting to stand 2x's from elevated bed up to walker but unable to stand fully upright with 1 assist; and mod assist to lateral scoot towards L along bed.  Pt noted to be incontinent of bowel after activities so nursing called to assist (pt cleaned up and bed linens changed).  Will continue to focus on strengthening and progressive functional mobility per pt tolerance.   Recommendations for follow up therapy are one component of a multi-disciplinary discharge planning process, led by the attending physician.  Recommendations may be updated based on patient status, additional functional criteria and insurance authorization.  Follow Up Recommendations  Skilled nursing-short term rehab (<3 hours/day)     Assistance Recommended at Discharge Frequent or constant Supervision/Assistance  Equipment Recommendations  Other (comment) (TBD at next facility)    Recommendations for Other Services       Precautions / Restrictions Precautions Precautions: Fall Precaution Comments: R chest port Restrictions Weight Bearing Restrictions: Yes RLE Weight Bearing: Weight bearing as tolerated Other Position/Activity Restrictions: has R LE protective boot in room (and shoe for L foot)     Mobility  Bed Mobility Overal bed mobility: Needs  Assistance Bed Mobility: Supine to Sit;Sit to Supine Rolling: Min guard (logrolling L and R in bed to change bed sheets and to clean up d/t bowel incontinence)   Supine to sit: Max assist;HOB elevated Sit to supine: Max assist;HOB elevated   General bed mobility comments: assist for B LE's and trunk; vc's for technique    Transfers Overall transfer level: Needs assistance Equipment used: Rolling walker (2 wheels) Transfers: Sit to/from Stand;Lateral/Scoot Transfers Sit to Stand: Total assist;From elevated surface          Lateral/Scoot Transfers: Mod assist;From elevated surface General transfer comment: x2 trials; pt able to clear buttocks off of bed when attempting to stand (from elevated surface) but unable to stand upright with 1 assist and max cueing; vc's for UE and LE placement.  Lateral scooting towards L with mod assist x1 and vc's for technique (extra time and effort to perform).    Ambulation/Gait             General Gait Details: not appropriate at this time   Stairs             Wheelchair Mobility    Modified Rankin (Stroke Patients Only)       Balance Overall balance assessment: Needs assistance Sitting-balance support: No upper extremity supported;Feet supported Sitting balance-Leahy Scale: Good Sitting balance - Comments: steady sitting reaching within BOS                                    Cognition Arousal/Alertness: Awake/alert Behavior During  Therapy: WFL for tasks assessed/performed Overall Cognitive Status: No family/caregiver present to determine baseline cognitive functioning                                 General Comments: A&O        Exercises Total Joint Exercises Ankle Circles/Pumps: AROM;Strengthening;Left;10 reps;Supine Quad Sets: AROM;Strengthening;Both;10 reps;Supine Heel Slides: AAROM;Strengthening;Both;10 reps;Supine Hip ABduction/ADduction: AAROM;Strengthening;Both;10 reps;Supine     General Comments General comments (skin integrity, edema, etc.): RLE swelling noted > L LE (nursing aware).  Nursing cleared pt for participation in physical therapy.  Pt agreeable to PT session.      Pertinent Vitals/Pain      Home Living                          Prior Function            PT Goals (current goals can now be found in the care plan section) Acute Rehab PT Goals Patient Stated Goal: to go home. PT Goal Formulation: With patient/family Time For Goal Achievement: 01/17/21 Potential to Achieve Goals: Fair Progress towards PT goals: Progressing toward goals    Frequency    Min 2X/week      PT Plan Frequency needs to be updated    Co-evaluation              AM-PAC PT "6 Clicks" Mobility   Outcome Measure  Help needed turning from your back to your side while in a flat bed without using bedrails?: A Little Help needed moving from lying on your back to sitting on the side of a flat bed without using bedrails?: A Lot Help needed moving to and from a bed to a chair (including a wheelchair)?: Total Help needed standing up from a chair using your arms (e.g., wheelchair or bedside chair)?: Total Help needed to walk in hospital room?: Total Help needed climbing 3-5 steps with a railing? : Total 6 Click Score: 9    End of Session Equipment Utilized During Treatment: Gait belt;Other (comment) (R LE boot and L shoe) Activity Tolerance: Patient tolerated treatment well Patient left: in bed;with call bell/phone within reach;with bed alarm set Nurse Communication: Mobility status;Precautions;Weight bearing status PT Visit Diagnosis: Unsteadiness on feet (R26.81);Other abnormalities of gait and mobility (R26.89);Muscle weakness (generalized) (M62.81);Difficulty in walking, not elsewhere classified (R26.2);Pain Pain - Right/Left: Right Pain - part of body: Ankle and joints of foot     Time: 0141-0301 PT Time Calculation (min) (ACUTE ONLY): 53  min  Charges:  $Therapeutic Exercise: 8-22 mins $Therapeutic Activity: 38-52 mins                    Leitha Bleak, PT 01/07/21, 5:10 PM

## 2021-01-07 NOTE — TOC Initial Note (Signed)
Transition of Care [facility]) - Initial/Assessment Note    Patient Details  Name: Sara Bright MRN: [id number] Date of Birth: 1940/05/13  [facility]) CM/SW Contact:    Pete Pelt, RN Phone Number: 01/07/2021, 3:12 PM  Clinical Narrative:    [facility] was at [facility] in [place], [place] does not wish to return.  She is COVID + and slightly confused upon calling, states call her daughter.  Message was left, and daughter called back.  She stated she would like another SNF, but not [facility].  Bed search sent out, awaiting results.  Patient will be able to go to SNF on day 11 after diagnosis.  tOC to follow               Expected Discharge Plan: [place] Barriers to Discharge: Continued Medical Work up   Patient Goals and CMS Choice   CMS Medicare.gov Compare Post Acute Care list provided to:: Other (Comment Required) Choice offered to / list presented to :  (Family wants facility other than [facility])  Expected Discharge Plan and Services Expected Discharge Plan: [place]   Discharge Planning Services: CM Consult   Living arrangements for the past 2 months: [place]                                      Prior Living Arrangements/Services Living arrangements for the past 2 months: [place] Lives with:: Self, Facility Resident Patient language and need for interpreter reviewed:: Yes (No interpreter required) Do you feel safe going back to the place where you live?: No   Wants facility other than SNF  Need for Family Participation in Patient Care: Yes (Comment) Care giver support system in place?: Yes (comment)   Criminal Activity/Legal Involvement Pertinent to Current Situation/Hospitalization: No - Comment as needed  Activities of Daily Living Home Assistive Devices/Equipment: Eyeglasses, Wheelchair, Environmental consultant (specify type), Other (Comment) (Right foot boot) ADL Screening (condition at time of  admission) Patient's cognitive ability adequate to safely complete daily activities?: No Is the patient deaf or have difficulty hearing?: No Does the patient have difficulty seeing, even when wearing glasses/contacts?: No Does the patient have difficulty concentrating, remembering, or making decisions?: No Patient able to express need for assistance with ADLs?: Yes Does the patient have difficulty dressing or bathing?: Yes Independently performs ADLs?: No Does the patient have difficulty walking or climbing stairs?: Yes Weakness of Legs: Both Weakness of Arms/Hands: Both  Permission Sought/Granted Permission sought to share information with : Customer service manager, Case Optician, dispensing granted to share information with : Yes, Verbal Permission Granted     Permission granted to share info w AGENCY: Prospective SNF        Emotional Assessment Appearance::  (Spoke to patient and daughter via phone)     Orientation: : Oriented to Self, Oriented to Place Alcohol / Substance Use: Not Applicable Psych Involvement: No (comment)  Admission diagnosis:  Lethargy [R53.83] Hyperglycemia [R73.9] AKI (acute kidney injury) ([place]) [N17.9] Urinary tract infection without hematuria, site unspecified [N39.0] AMS (altered mental status) [R41.82] Patient Active Problem List   Diagnosis Date Noted   Swelling    Slurred speech    Anemia of chronic disease    Secondary hyperparathyroidism ([place])    Urinary tract infection due to ESBL Klebsiella    Primary hyperparathyroidism ([place])    CKD stage 4 due to type 2  diabetes mellitus ([place])    Acute metabolic encephalopathy    Acute cystitis with hematuria    Acute kidney injury superimposed on CKD ([place])    Hypercalcemia    AMS (altered mental status) 01/02/2021   COVID-19 virus infection 01/02/2021   Acute respiratory failure with hypoxia ([place]) 01/02/2021   Acute idiopathic gout of left foot 06/20/2019   Edema of lower extremity  03/19/2019   Hyperkalemia 03/19/2019   GERD (gastroesophageal reflux disease) 01/20/2019   Hyperlipidemia 01/20/2019   Claudication ([place]) 01/07/2019   Venous ulcer of left leg ([place]) 08/13/2018   Diabetic polyneuropathy associated with type 2 diabetes mellitus ([place]) 05/30/2018   Macrocytosis 04/26/2018   Fecal occult blood test positive 10/08/2017   IDA (iron deficiency anemia) 10/08/2017   Chronic venous insufficiency 12/25/2016   Lymphedema 12/25/2016   Diabetes ([place]) 12/25/2016   Essential hypertension 12/25/2016   Hx of adenomatous colonic polyps 10/22/2016   DOE (dyspnea on exertion) 07/22/2016   CKD stage 3 due to type 2 diabetes mellitus ([place]) 06/23/2016   Aortic atherosclerosis ([place]) 03/09/2016   Asthma with chronic obstructive pulmonary disease (COPD) ([place]) 03/09/2016   Swelling of limb 12/31/2015   Severe obesity (BMI >= 40) ([place]) 12/31/2015   Varicose veins of both legs with edema 12/31/2015   VV (varicose veins) 11/27/2015   Other iron deficiency anemia 10/08/2015   Anemia due to stage 3 chronic kidney disease ([place]) 10/08/2015   BMI 45.0-49.9, adult ([place]) 10/05/2015   OSA on CPAP 03/10/2015   Lumbar stenosis with neurogenic claudication 09/30/2011   PCP:  Anthonette Legato, MD Pharmacy:   [id number] - [place], [place] [address] Phone: [phone] Fax: [phone]     Social Determinants of Health (SDOH) Interventions    Readmission Risk Interventions No flowsheet data found.

## 2021-01-07 NOTE — Progress Notes (Signed)
Patient ID: LEARA RAWL, female   DOB: [birth year], 80 y.o.   MRN: [id number] [facility] PROGRESS NOTE  NEMESIS RAINWATER DHR:[id number] DOB: 13-Sep-1940 DOA: 01/02/2021 PCP: Anthonette Legato, MD  HPI/Subjective: Patient not feeling well today.  Had some diarrhea when working with physical therapy.  No slurred speech today.  Put on dysphagia diet by speech therapy.  Initially admitted with acute metabolic encephalopathy, UTI and COVID-19 infection.  Objective: Vitals:   01/07/21 0844 01/07/21 1203  BP: (!) 141/60 140/64  Pulse: 75 69  Resp: 16 20  Temp: 99 F (37.2 C) 98.1 F (36.7 C)  SpO2: 97% 100%    Intake/Output Summary (Last 24 hours) at 01/07/2021 1350 Last data filed at 01/07/2021 1003 Gross per 24 hour  Intake 2541.17 ml  Output 1550 ml  Net 991.17 ml   Filed Weights   01/02/21 1451 01/03/21 1957 01/03/21 1957  Weight: 110.3 kg 109.3 kg 109.3 kg    ROS: Review of Systems  Respiratory:  Negative for shortness of breath.   Cardiovascular:  Negative for chest pain.  Gastrointestinal:  Positive for diarrhea and nausea. Negative for abdominal pain and vomiting.  Exam: Physical Exam HENT:     Head: Normocephalic.     Mouth/Throat:     Pharynx: No oropharyngeal exudate.  Eyes:     General: Lids are normal.     Conjunctiva/sclera: Conjunctivae normal.  Cardiovascular:     Rate and Rhythm: Normal rate and regular rhythm.     Heart sounds: Normal heart sounds, S1 normal and S2 normal.  Pulmonary:     Breath sounds: Examination of the right-lower field reveals decreased breath sounds. Examination of the left-lower field reveals decreased breath sounds. Decreased breath sounds present. No wheezing, rhonchi or rales.  Abdominal:     Palpations: Abdomen is soft.     Tenderness: There is no abdominal tenderness.  Musculoskeletal:     Right lower leg: Swelling present.     Left lower leg: Swelling present.  Skin:    General: Skin is warm.     Findings: No rash.   Neurological:     Mental Status: She is alert and oriented to person, place, and time.     Comments: Did not notice on the slurred speech today.      Scheduled Meds:  allopurinol  100 mg Oral Daily   amLODipine  10 mg Oral Daily   vitamin C  500 mg Oral Daily   aspirin EC  81 mg Oral Daily   carvedilol  12.5 mg Oral BID WC   Chlorhexidine Gluconate Cloth  6 each Topical Daily   cholecalciferol  1,000 Units Oral Daily   ferrous sulfate  325 mg Oral Daily   gemfibrozil  600 mg Oral Daily   heparin  5,000 Units Subcutaneous Q8H   insulin aspart  0-15 Units Subcutaneous TID WC   insulin aspart  4 Units Subcutaneous TID WC   insulin glargine-yfgn  12 Units Subcutaneous QHS   ipratropium  2 puff Inhalation Q6H   latanoprost  1 drop Both Eyes QHS   multivitamin with minerals  1 tablet Oral Daily   pantoprazole  40 mg Oral QHS   Ensure Max Protein  237 mL Oral BID BM   zinc sulfate  220 mg Oral Daily   Continuous Infusions:  piperacillin-tazobactam (ZOSYN)  IV 12.5 mL/hr at 01/07/21 1003   Brief history.  80 year old female with past medical history of diabetes, GERD, hyperlipidemia, essential hypertension, neuropathy, hypercalcemia,  secondary hyperparathyroidism and chronic kidney disease presented with altered mental status.  She was found to have a COVID infection and completed 5 days of remdesivir.  She was found to have ESBL Klebsiella UTI.  Initially was on Rocephin but then antibiotics had to be switched over to meropenem and now on Zosyn to complete 5 days which should complete tomorrow.  Patient also had acute kidney injury which improved with IV fluid hydration.  Patient has hypercalcemia likely secondary hyperparathyroidism along with vitamin D deficiency.  Assessment/Plan:  ESBL Klebsiella with acute cystitis with hematuria.  Patient currently on Zosyn and will complete 5 days of antibiotic ending on 01/08/2021. Acute metabolic encephalopathy with intermittent slurring of  words.  Case discussed yesterday with neurology and they reviewed MRI of the brain which did not show any acute event.  Repeat CT scan was negative.  Speech evaluation downgraded diet to dysphagia diet.  Acetylcholine receptor antibody sent off but still pending. COVID-19 infection.  Completed 5 days of remdesivir.  Continue airborne isolation.  Can go out to rehab once bed found after isolation ends. Acute kidney injury on chronic kidney disease stage IIIa.  Creatinine initially worsened and peaked at 3.59.  Creatinine improved with IV fluids down to 1.39 and this looks like the best creatinine she has had in a long time. Secondary hyperparathyroidism with hypercalcemia and vitamin D deficiency.  Calcium 10.2 today.  Supplement oral vitamin D for a level of 29.30. Essential hypertension.  Continue Norvasc 10 mg daily and Coreg. Previous fractures bone of right foot.  Patient and family interested in going home but needs to be able to walk.  Continue physical therapy evaluation or else we will have to go back to rehab. Type 2 diabetes mellitus with chronic kidney disease stage IIIa.  Low-dose Semglee insulin started and sliding scale insulin.  As outpatient she uses once a week medications. Hypertriglyceridemia on gemfibrozil Anemia of chronic disease.  Last hemoglobin 8.3 GERD on PPI Glaucoma on latanoprost Diarrhea.  Get rid of MiraLAX and senna. Morbid obesity with a BMI of 41.36 Swelling lower extremities.  We will get an ultrasound to rule out DVT.        Code Status:     Code Status Orders  (From admission, onward)           Start     Ordered   01/02/21 2120  Full code  Continuous        01/02/21 2142           Code Status History     This patient has a current code status but no historical code status.      Advance Directive Documentation    Flowsheet Row Most Recent Value  Type of Advance Directive Healthcare Power of Attorney  Pre-existing out of facility DNR  order (yellow form or pink MOST form) --  "MOST" Form in Place? --      Family Communication: Spoke with daughter at the bedside Disposition Plan: Status is: Inpatient  Consultants: Nephrology Case discussed with neurology  Antibiotics: Zosyn  Time spent: 28 minutes  [place]

## 2021-01-07 NOTE — Progress Notes (Signed)
Inpatient Diabetes Program Recommendations  AACE/ADA: New Consensus Statement on Inpatient Glycemic Control (2015)  Target Ranges:  Prepandial:   less than 140 mg/dL      Peak postprandial:   less than 180 mg/dL (1-2 hours)      Critically ill patients:  140 - 180 mg/dL   Lab Results  Component Value Date   GLUCAP 164 (H) 01/07/2021   HGBA1C 9.0 (H) 01/02/2021    Review of Glycemic Control Results for Sara Bright, Sara Bright (MRN [id number]) as of 01/07/2021 10:55  Ref. Range 01/06/2021 08:30 01/06/2021 11:14 01/06/2021 15:56 01/06/2021 21:51 01/07/2021 08:45  Glucose-Capillary Latest Ref Range: 70 - 99 mg/dL 191 (H) 300 (H) 209 (H) 250 (H) 164 (H)   Diabetes history: DM 2 Outpatient Diabetes medications:  Trulicity 6.07 mg weekly Current orders for Inpatient glycemic control:  Novolog moderate tid with meals Novolog 3 units daily with meals (hold if patient eats less than 50% or NPO) Semglee 8 units q HS  Inpatient Diabetes Program Recommendations:    May consider increasing Semglee too 12 units q HS.  Also consider increasing Novolog meal coverage to 4 units tid with meals (hold if patient eats less than 50% or NPO).   Thanks,  Adah Perl, RN, BC-ADM Inpatient Diabetes Coordinator Pager [phone]  (8a-5p)

## 2021-01-07 NOTE — Progress Notes (Addendum)
SLP Cancellation Note  Patient Details Name: Sara Bright MRN: [id number] DOB: [birth year]   Cancelled treatment:        Chart reviewed. Atempted to see. Staff in the room working with Pt at present. Will reattempt swallow eval later.   Lucila Maine 01/07/2021, 11:17 AM

## 2021-01-07 NOTE — Progress Notes (Addendum)
[facility], [place] 01/07/21  Subjective:   LOS: 5 10/31 0701 - 11/01 0700 In: 2140.8 [P.O.:120; I.V.:1858.2; IV Piggyback:162.6] Out: 750 [Urine:750] Patient known to our practice from outpatient follow-up Presented to the emergency room for high blood sugar and confusion. She is a resident of [facility]  Patient seen sitting up in bed Alert and oriented Tolerating small meals, but continues to complain of nausea No vomiting  Creatinine improving   Objective:  Vital signs in last 24 hours:  Temp:  [98.6 F (37 C)-99.9 F (37.7 C)] 99 F (37.2 C) (11/01 0844) Pulse Rate:  [75-95] 75 (11/01 0844) Resp:  [16-20] 16 (11/01 0844) BP: (129-181)/(54-70) 141/60 (11/01 0844) SpO2:  [95 %-100 %] 97 % (11/01 0844)  Weight change:  Filed Weights   01/02/21 1451 01/03/21 1957 01/03/21 1957  Weight: 110.3 kg 109.3 kg 109.3 kg    Intake/Output:    Intake/Output Summary (Last 24 hours) at 01/07/2021 0953 Last data filed at 01/07/2021 0725 Gross per 24 hour  Intake 2140.8 ml  Output 1550 ml  Net 590.8 ml     Physical Exam: General:  No acute distress, laying in the bed  HEENT  anicteric, moist oral mucous membrane  Pulm/lungs  normal breathing effort, lungs are clear to auscultation  CVS/Heart  regular rhythm, no rub or gallop  Abdomen:   Soft, nontender  Extremities:  No peripheral edema  Neurologic:  Alert, oriented, able to follow commands  Skin:  No acute rashes    Basic Metabolic Panel:  Recent Labs  Lab 01/03/21 0626 01/03/21 1658 01/04/21 0500 01/05/21 0439 01/06/21 0535 01/07/21 0633  NA 139  --  136 139 143 141  K 4.7  --  3.5 3.6 3.7 3.8  CL 99  --  99 104 109 109  CO2 24  --  26 24 23 25   GLUCOSE 307*  --  125* 121* 169* 187*  BUN 64*  --  77* 65* 43* 29*  CREATININE 2.75*  --  3.59* 2.47* 1.62* 1.39*  CALCIUM 11.1*  --  10.5* 10.3 10.6* 10.2  PHOS  --  4.2  --   --   --   --       CBC: Recent Labs  Lab  01/02/21 1504 01/02/21 2227 01/04/21 0500 01/05/21 0439 01/06/21 0535  WBC 8.6 8.9 7.0  --   --   HGB 8.3* 8.1* 7.7* 7.8* 8.3*  HCT 25.5* 24.7* 24.5*  --   --   MCV 103.2* 104.2* 102.9*  --   --   PLT 190 173 180  --   --       No results found for: HEPBSAG, HEPBSAB, HEPBIGM    Microbiology:  Recent Results (from the past 240 hour(s))  Resp Panel by RT-PCR (Flu A&B, Covid) Nasopharyngeal Swab     Status: Abnormal   Collection Time: 01/02/21  4:55 PM   Specimen: Nasopharyngeal Swab; Nasopharyngeal(NP) swabs in vial transport medium  Result Value Ref Range Status   SARS Coronavirus 2 by RT PCR POSITIVE (A) NEGATIVE Corrected    Comment: RESULT CALLED TO, READ BACK BY AND VERIFIED WITH: HINA HELSEL 1921 01/02/21 MU (NOTE) SARS-CoV-2 target nucleic acids are DETECTED.  The SARS-CoV-2 RNA is generally detectable in upper respiratory specimens during the acute phase of infection. Positive results are indicative of the presence of the identified virus, but do not rule out bacterial infection or co-infection with other pathogens not detected by the test. Clinical  correlation with patient history and other diagnostic information is necessary to determine patient infection status. The expected result is Negative.  Fact Sheet for Patients: EntrepreneurPulse.com.au  Fact Sheet for Healthcare Providers: IncredibleEmployment.be  This test is not yet approved or cleared by the [place] FDA and  has been authorized for detection and/or diagnosis of SARS-CoV-2 by FDA under an Emergency Use Authorization (EUA).  This EUA will remain in effect (meaning this test can be used ) for the duration of  the COVID-19 declaration under Section 564(b)(1) of the Act, 21 U.S.C. section 360bbb-3(b)(1), unless the authorization is terminated or revoked sooner.  CORRECTED ON 10/27 AT 1952: PREVIOUSLY REPORTED AS POSITIVE CRITICAL RESULT CALLED TO, READ BACK  BY AND VERIFIED WITH: [place] 01/02/21 MU    Influenza A by PCR NEGATIVE NEGATIVE Final   Influenza B by PCR NEGATIVE NEGATIVE Final    Comment: (NOTE) The Xpert Xpress SARS-CoV-2/FLU/RSV plus assay is intended as an aid in the diagnosis of influenza from Nasopharyngeal swab specimens and should not be used as a sole basis for treatment. Nasal washings and aspirates are unacceptable for Xpert Xpress SARS-CoV-2/FLU/RSV testing.  Fact Sheet for Patients: EntrepreneurPulse.com.au  Fact Sheet for Healthcare Providers: IncredibleEmployment.be  This test is not yet approved or cleared by the [place] FDA and has been authorized for detection and/or diagnosis of SARS-CoV-2 by FDA under an Emergency Use Authorization (EUA). This EUA will remain in effect (meaning this test can be used) for the duration of the COVID-19 declaration under Section 564(b)(1) of the Act, 21 U.S.C. section 360bbb-3(b)(1), unless the authorization is terminated or revoked.  Performed at [facility], [address], [place], [place] [address]   Urine Culture     Status: Abnormal   Collection Time: 01/02/21  6:00 PM   Specimen: Urine, Clean Catch  Result Value Ref Range Status   Specimen Description   Final    URINE, CLEAN CATCH Performed at [facility], [address] [place], [place] [address]    Special Requests   Final    NONE Performed at [facility], [address] [place], [place] [address]    Culture (A)  Final    >=100,000 COLONIES/mL KLEBSIELLA PNEUMONIAE Confirmed Extended Spectrum Beta-Lactamase Producer (ESBL).  In bloodstream infections from ESBL organisms, carbapenems are preferred over piperacillin/tazobactam. They are shown to have a lower risk of mortality.    Report Status 01/04/2021 FINAL  Final   Organism ID, Bacteria KLEBSIELLA PNEUMONIAE (A)  Final      Susceptibility   Klebsiella pneumoniae - MIC*     AMPICILLIN >=32 RESISTANT Resistant     CEFAZOLIN >=64 RESISTANT Resistant     CEFEPIME >=32 RESISTANT Resistant     CEFTRIAXONE >=64 RESISTANT Resistant     CIPROFLOXACIN 0.5 INTERMEDIATE Intermediate     GENTAMICIN <=1 SENSITIVE Sensitive     IMIPENEM <=0.25 SENSITIVE Sensitive     NITROFURANTOIN 64 INTERMEDIATE Intermediate     TRIMETH/SULFA >=320 RESISTANT Resistant     AMPICILLIN/SULBACTAM 16 INTERMEDIATE Intermediate     PIP/TAZO <=4 SENSITIVE Sensitive     * >=100,000 COLONIES/mL KLEBSIELLA PNEUMONIAE    Coagulation Studies: No results for input(s): LABPROT, INR in the last 72 hours.  Urinalysis: No results for input(s): COLORURINE, LABSPEC, PHURINE, GLUCOSEU, HGBUR, BILIRUBINUR, KETONESUR, PROTEINUR, UROBILINOGEN, NITRITE, LEUKOCYTESUR in the last 72 hours.  Invalid input(s): APPERANCEUR     Imaging: CT HEAD WO CONTRAST (5MM)  Result Date: 01/06/2021 CLINICAL DATA:  Neuro deficit, acute, stroke suspected; new difficulty swallowing EXAM: CT HEAD WITHOUT CONTRAST TECHNIQUE: Contiguous axial images were obtained from the base of the skull through the vertex without intravenous contrast. COMPARISON:  01/02/2021 FINDINGS: Brain: There is no acute intracranial hemorrhage, mass effect, or edema. Gray-white differentiation is preserved. There is no extra-axial fluid collection. Ventricles and sulci are stable in size and configuration. Vascular: There is atherosclerotic calcification at the skull base. Skull: Calvarium is unremarkable. Sinuses/Orbits: No acute finding. Other: None. IMPRESSION: No acute intracranial abnormality. Electronically Signed   By: Macy Mis M.D.   On: 01/06/2021 15:27     Medications:    piperacillin-tazobactam (ZOSYN)  IV 3.375 g (01/07/21 1308)    allopurinol  100 mg Oral Daily   amLODipine  10 mg Oral Daily   vitamin C  500 mg Oral Daily   aspirin EC  81 mg Oral Daily   carvedilol  12.5 mg Oral BID WC   Chlorhexidine Gluconate Cloth  6 each  Topical Daily   cholecalciferol  1,000 Units Oral Daily   ferrous sulfate  325 mg Oral Daily   gemfibrozil  600 mg Oral Daily   heparin  5,000 Units Subcutaneous Q8H   insulin aspart  0-15 Units Subcutaneous TID WC   insulin aspart  3 Units Subcutaneous TID WC   insulin glargine-yfgn  8 Units Subcutaneous QHS   ipratropium  2 puff Inhalation Q6H   latanoprost  1 drop Both Eyes QHS   multivitamin with minerals  1 tablet Oral Daily   pantoprazole  40 mg Oral QHS   polyethylene glycol  17 g Oral Daily   Ensure Max Protein  237 mL Oral BID BM   senna  1 tablet Oral QHS   zinc sulfate  220 mg Oral Daily   albuterol, guaiFENesin-dextromethorphan, ondansetron (ZOFRAN) IV, ondansetron  Assessment/ Plan:  80 y.o. female with   Principal Problem:   AMS (altered mental status) Active Problems:   Anemia due to stage 3 chronic kidney disease (HCC)   CKD stage 3 due to type 2 diabetes mellitus (HCC)   Diabetes (HCC)   Essential hypertension   GERD (gastroesophageal reflux disease)   IDA (iron deficiency anemia)   COVID-19 virus infection   Acute respiratory failure with hypoxia (HCC)   Urinary tract infection due to ESBL Klebsiella   Primary hyperparathyroidism ([place])   CKD stage 4 due to type 2 diabetes mellitus ([place])   Secondary hyperparathyroidism ([place])   Slurred speech   Anemia of chronic disease   #.  Acute kidney injury on CKD st 3B, baseline appears to be a creatinine 1.31 and GFR 41 on 12/23/20 Recent Labs    01/04/21 0500 01/05/21 0439 01/06/21 0535 01/07/21 0633  CREATININE 3.59* 2.47* 1.62* 1.39*   CKD risk factors include advanced age, diabetes, hypertension AKI likely secondary to ATN from concurrent illness including UTI and COVID Creatinine at baseline with IVF, will continue for 24 hours Continue supportive care.  Will monitor patient and renal recovery, but may not visit patient daily.   #. Anemia of CKD  Lab Results  Component Value Date   HGB 8.3 (L)  01/06/2021   Agree with oral iron supplementation Iron studies indicate chronic inflammation   #. SHPTH with hypercalcemia     Component Value Date/Time   PTH 99 (H) 01/03/2021 0727   Lab Results  Component Value Date   PHOS 4.2 01/03/2021  Calcium has returned to normal range   #. Diabetes type  2 with CKD Hgb A1c MFr Bld (%)  Date Value  01/02/2021 9.0 (H)  Glucose stable Will consider SGLT2 inhibitor as outpatient  #Urinary tract infection with Klebsiella pneumoniae Remains on IV meropenem  #SARS-CoV-2 + January 02, 2021 IV remdesivir    LOS: 5 [place] 11/1/20229:53 [place], [place] patient is known

## 2021-01-07 NOTE — Evaluation (Addendum)
Clinical/Bedside Swallow Evaluation Patient Details  Name: Sara Bright MRN: [id number] Date of Birth: 03/22/1940  Today's Date: 01/07/2021 Time: SLP Start Time (ACUTE ONLY): 1200 SLP Stop Time (ACUTE ONLY): 1250 SLP Time Calculation (min) (ACUTE ONLY): 50 min  Past Medical History:  Past Medical History:  Diagnosis Date   Adenoma of colon    Adenomatous colon polyp    Anemia    IDA   Cataract    Cervical stenosis of spinal canal    Chronic kidney disease    stage 3   Diabetes mellitus without complication (HCC)    GERD (gastroesophageal reflux disease)    Glaucoma    Heme positive stool    History of UTI    Hx of gallstones    Hyperkalemia    Hyperlipidemia    Hyperlipidemia    Hypertension    Lumbar stenosis    Lymphedema    Neuromuscular disorder (HCC)    Neuropathy associated with endocrine disorder (HCC)    Obesity    OSA on CPAP    on CPAP   Osteoarthritis    Osteoarthritis    Port-A-Cath in place    Retinopathy due to secondary diabetes ([place])    Secondary hyperparathyroidism of renal origin [facility])    Past Surgical History:  Past Surgical History:  Procedure Laterality Date   ACDF X2     BACK SURGERY     Cataract extraction right     catarect extraction left     CHOLECYSTECTOMY     COLONOSCOPY WITH PROPOFOL N/A 12/02/2016   Procedure: COLONOSCOPY WITH PROPOFOL;  Surgeon: Manya Silvas, MD;  Location: [facility] ENDOSCOPY;  Service: Endoscopy;  Laterality: N/A;   COLONOSCOPY WITH PROPOFOL N/A 11/10/2017   Procedure: COLONOSCOPY WITH PROPOFOL;  Surgeon: Toledo, Benay Pike, MD;  Location: [facility] ENDOSCOPY;  Service: Gastroenterology;  Laterality: N/A;   colonoscopy with removal lesions by snare     Endoscoic carpal tunnel release     ESOPHAGOGASTRODUODENOSCOPY N/A 11/10/2017   Procedure: ESOPHAGOGASTRODUODENOSCOPY (EGD);  Surgeon: Toledo, Benay Pike, MD;  Location: [facility] ENDOSCOPY;  Service: Gastroenterology;  Laterality: N/A;   ESOPHAGOGASTRODUODENOSCOPY (EGD) WITH  PROPOFOL N/A 12/02/2016   Procedure: ESOPHAGOGASTRODUODENOSCOPY (EGD) WITH PROPOFOL;  Surgeon: Manya Silvas, MD;  Location: [facility] ENDOSCOPY;  Service: Endoscopy;  Laterality: N/A;   IR IMAGING GUIDED PORT INSERTION  10/01/2017   Laminectomy posterior lumbar facetectomy and formaninotomy w/Decomp     Laminectony posterior cervicle decomp w/Facectomy and foraminotomy     VEIN LIGATION AND STRIPPING Left    HPI:  Per admitting H &P " Sara Bright is a 80 y.o. female seen in ed with complaints of   Hpi is per chart review and pt is somnolent and unresponsive moved to temerity until with cont cardiac monitoring. Pt was hyperglycemic was given insulin and now her sugars are in the 88's. Pt was noted to be more lethargic by family members ad well.      Pt has past medical history of polyp-adenomatous, anemia, cataracts, chronic kidney disease stage III, diabetes mellitus type 2, GERD, glaucoma, guaiac positive stools, hyperlipidemia, hypertension, OSA, obesity."    Assessment / Plan / Recommendation  Clinical Impression  Pt presents with mild to moderate oral phase dysphagia but no overt s/s of aspiration. Non apprent weakness noted during oral mech exam. Pt reports sometimes she has trouble swallowing after she has chewed the food thoroughly. "It seems to get bigger and bigger" Noted Pt presented with oral transit delay with both  purees and solids but more significant with solids. Pt has adequate dentition and muscle strength for chewing and swallowing  purees and solids. Pt was noted to fatigue easily, needing periods of rest and some shortness of breath as well. No s/s of aspiration with any tested consisency. Rec diet downgrade to Dysphagia 2 with thin liquids. Pt was encoraged to choose foods that are easier for her to chew and swallow. ST to follow up with toleration of diet and alter as needed. Pt reports her family said her speech is sometimes "slurred" but she isnt sure. Speech was mildly  dysarthric with low volume but intelligible and appropriate. Pt is COVID positive. ST to follow up with toleration of diet and advance as indicated. If Dysrathria continues and affects communication once precautions are lifted, will proceed with Speech and cognitive eval or recommend at discharge. SLP Visit Diagnosis: Dysphagia, unspecified (R13.10)    Aspiration Risk  Mild aspiration risk    Diet Recommendation Dysphagia 2 (Fine chop)   Medication Administration: Whole meds with liquid Supervision: Staff to assist with self feeding Compensations: Minimize environmental distractions;Small sips/bites;Slow rate;Other (Comment) (Allow for periods of rest) Postural Changes: Remain upright for at least 30 minutes after po intake;Seated upright at 90 degrees    Other  Recommendations Oral Care Recommendations: Oral care BID    Recommendations for follow up therapy are one component of a multi-disciplinary discharge planning process, led by the attending physician.  Recommendations may be updated based on patient status, additional functional criteria and insurance authorization.  Follow up Recommendations 24 hour supervision/assistance      Frequency and Duration min 2x/week          Prognosis Prognosis for Safe Diet Advancement: Good      Swallow Study   General Date of Onset: 01/02/21 HPI: Per admitting H &P " Sara Bright is a 80 y.o. female seen in ed with complaints of   Hpi is per chart review and pt is somnolent and unresponsive moved to temerity until with cont cardiac monitoring. Pt was hyperglycemic was given insulin and now her sugars are in the 88's. Pt was noted to be more lethargic by family members ad well.      Pt has past medical history of polyp-adenomatous, anemia, cataracts, chronic kidney disease stage III, diabetes mellitus type 2, GERD, glaucoma, guaiac positive stools, hyperlipidemia, hypertension, OSA, obesity." Type of Study: Bedside Swallow Evaluation Diet  Prior to this Study: Regular Temperature Spikes Noted: No Respiratory Status: Room air History of Recent Intubation: No Behavior/Cognition: Pleasant mood;Cooperative Oral Cavity Assessment: Within Functional Limits Oral Cavity - Dentition: Dentures, top;Dentures, bottom Vision: Functional for self-feeding Self-Feeding Abilities: Needs assist;Needs set up Patient Positioning: Upright in bed Baseline Vocal Quality: Low vocal intensity    Oral/Motor/Sensory Function Overall Oral Motor/Sensory Function: Within functional limits   Ice Chips Ice chips: Within functional limits Presentation: Spoon   Thin Liquid Thin Liquid: Within functional limits Presentation: Cup;Spoon;Straw    Nectar Thick Nectar Thick Liquid: Not tested   Honey Thick Honey Thick Liquid: Not tested   Puree Puree: Impaired Presentation: Spoon Oral Phase Impairments: Reduced lingual movement/coordination Oral Phase Functional Implications: Prolonged oral transit   Solid     Solid: Impaired Presentation: Self Fed Oral Phase Impairments: Impaired mastication;Reduced lingual movement/coordination Oral Phase Functional Implications: Oral residue;Prolonged oral transit      Lucila Maine 01/07/2021,12:51 PM

## 2021-01-07 NOTE — TOC Progression Note (Signed)
Transition of Care [facility]) - Progression Note    Patient Details  Name: Sara Bright MRN: [id number] Date of Birth: 1940-05-05  Transition of Care [facility]) CM/SW [place], RN Phone Number: 01/07/2021, 2:58 PM  Clinical Narrative:  Left message for patient's family on voicemail.  Awaiting return call    Called to patient room. She agreed to bed search for an alternate facility.  Patient does not wish to return to [facility], but patient states speak to daughter.  RNCM informed her that I left daughter a message.    TOC to follow.          Expected Discharge Plan and Services                                                 Social Determinants of Health (SDOH) Interventions    Readmission Risk Interventions No flowsheet data found.

## 2021-01-08 ENCOUNTER — Inpatient Hospital Stay: Payer: Medicare HMO

## 2021-01-08 DIAGNOSIS — N390 Urinary tract infection, site not specified: Secondary | ICD-10-CM | POA: Diagnosis not present

## 2021-01-08 DIAGNOSIS — R4182 Altered mental status, unspecified: Secondary | ICD-10-CM | POA: Diagnosis not present

## 2021-01-08 DIAGNOSIS — U071 COVID-19: Secondary | ICD-10-CM | POA: Diagnosis not present

## 2021-01-08 DIAGNOSIS — B9689 Other specified bacterial agents as the cause of diseases classified elsewhere: Secondary | ICD-10-CM | POA: Diagnosis not present

## 2021-01-08 LAB — BASIC METABOLIC PANEL
Anion gap: 7 (ref 5–15)
BUN: 21 mg/dL (ref 8–23)
CO2: 26 mmol/L (ref 22–32)
Calcium: 10.2 mg/dL (ref 8.9–10.3)
Chloride: 106 mmol/L (ref 98–111)
Creatinine, Ser: 1.39 mg/dL — ABNORMAL HIGH (ref 0.44–1.00)
GFR, Estimated: 38 mL/min — ABNORMAL LOW (ref >60)
Glucose, Bld: 140 mg/dL — ABNORMAL HIGH (ref 70–99)
Potassium: 3.6 mmol/L (ref 3.5–5.1)
Sodium: 139 mmol/L (ref 135–145)

## 2021-01-08 LAB — VITAMIN B12: Vitamin B-12: 1244 pg/mL — ABNORMAL HIGH (ref 180–914)

## 2021-01-08 LAB — CBC
HCT: 23.6 % — ABNORMAL LOW (ref 36.0–46.0)
Hemoglobin: 7.4 g/dL — ABNORMAL LOW (ref 12.0–15.0)
MCH: 32.3 pg (ref 26.0–34.0)
MCHC: 31.4 g/dL (ref 30.0–36.0)
MCV: 103.1 fL — ABNORMAL HIGH (ref 80.0–100.0)
Platelets: 174 10*3/uL (ref 150–400)
RBC: 2.29 MIL/uL — ABNORMAL LOW (ref 3.87–5.11)
RDW: 15.5 % (ref 11.5–15.5)
WBC: 5.4 10*3/uL (ref 4.0–10.5)
nRBC: 0 % (ref 0.0–0.2)

## 2021-01-08 LAB — GLUCOSE, CAPILLARY
Glucose-Capillary: 130 mg/dL — ABNORMAL HIGH (ref 70–99)
Glucose-Capillary: 150 mg/dL — ABNORMAL HIGH (ref 70–99)
Glucose-Capillary: 161 mg/dL — ABNORMAL HIGH (ref 70–99)
Glucose-Capillary: 203 mg/dL — ABNORMAL HIGH (ref 70–99)
Glucose-Capillary: 205 mg/dL — ABNORMAL HIGH (ref 70–99)

## 2021-01-08 MED ORDER — ACETAMINOPHEN 325 MG PO TABS
650.0000 mg | ORAL_TABLET | Freq: Four times a day (QID) | ORAL | Status: DC | PRN
  Administered 2021-01-08 – 2021-01-13 (×3): 650 mg via ORAL
  Filled 2021-01-08 (×4): qty 2

## 2021-01-08 NOTE — Progress Notes (Signed)
Occupational Therapy Treatment Patient Details Name: Sara Bright MRN: [id number] DOB: [birth year] Today's Date: 01/08/2021   History of present illness Sara Bright is a 80 y.o. female with history of hypertension, CKD, diabetes, OSA on CPAP who comes in with concerns for hyperglycemia and lethargy.  H/o R foot fx's.   OT comments  Upon entering the room, pt supine in bed with no c/o pain . Lunch tray in front of her and pt reports she is done eating. Pt needs encouragement for OT intervention this session. Pt refuses EOG and attempts to stand and reports, " I lost my bowels last time and I will not be embarrassed like that again". OT encouraged pt to attempt but she declined. Pt does wash face with set up A and then OT engaged pt in B UE strengthening exercises with use of towel in similar fashion to dowel rod. Pt performs 10 reps of chest presses and bicep curls with rest breaks between sets. Pt appears to fatigue very quickly. She reports feeling like she is unable to do more and therapist demonstrated further exercises she can attempt on her own later. Pt requesting to be placed on bed pan with mod A to roll to the R. RN notified of pt on bed pan and family member enters the room as therapist exits. Call bell within reach.    Recommendations for follow up therapy are one component of a multi-disciplinary discharge planning process, led by the attending physician.  Recommendations may be updated based on patient status, additional functional criteria and insurance authorization.    Follow Up Recommendations  Skilled nursing-short term rehab (<3 hours/day)       Equipment Recommendations  Other (comment) (defer to next venue of care)       Precautions / Restrictions Precautions Precautions: Fall Precaution Comments: R chest port Restrictions Weight Bearing Restrictions: Yes RLE Weight Bearing: Weight bearing as tolerated Other Position/Activity Restrictions: has R LE protective boot  in room (and shoe for L foot)       Mobility Bed Mobility Overal bed mobility: Needs Assistance Bed Mobility: Rolling Rolling: Mod assist         General bed mobility comments: mod A to roll to R to place bed pan    Transfers                   General transfer comment: Pt refusal         ADL either performed or assessed with clinical judgement   ADL Overall ADL's : Needs assistance/impaired     Grooming: Wash/dry hands;Wash/dry face;Set up;Bed level;Supervision/safety                                       Vision Patient Visual Report: No change from baseline            Cognition Arousal/Alertness: Awake/alert Behavior During Therapy: WFL for tasks assessed/performed Overall Cognitive Status: No family/caregiver present to determine baseline cognitive functioning                                 General Comments: A&O                     Pertinent Vitals/ Pain       Pain Assessment: No/denies pain  Frequency  Min 1X/week        Progress Toward Goals  OT Goals(current goals can now be found in the care plan section)  Progress towards OT goals: Progressing toward goals  Acute Rehab OT Goals Patient Stated Goal: go home OT Goal Formulation: With patient Time For Goal Achievement: 01/20/21 Potential to Achieve Goals: [place] Discharge plan remains appropriate;Frequency remains appropriate       AM-PAC OT "6 Clicks" Daily Activity     Outcome Measure   Help from another person eating meals?: None Help from another person taking care of personal grooming?: A Little Help from another person toileting, which includes using toliet, bedpan, or urinal?: Total Help from another person bathing (including washing, rinsing, drying)?: A Lot Help from another person to put on and taking off regular upper body clothing?: A Little Help from another person to put on and taking off regular lower body  clothing?: A Lot 6 Click Score: 15    End of Session    OT Visit Diagnosis: Other abnormalities of gait and mobility (R26.89);Muscle weakness (generalized) (M62.81)   Activity Tolerance Patient tolerated treatment well   Patient Left in bed;with call bell/phone within reach;with bed alarm set   Nurse Communication Mobility status;Other (comment) (pt on bed pan)        Time: 1355-1420 OT Time Calculation (min): 25 min  Charges: OT General Charges $OT Visit: 1 Visit OT Treatments $Self Care/Home Management : 8-22 mins $Therapeutic Exercise: 8-22 mins  Darleen Crocker, MS, OTR/L , CBIS ascom [phone]  01/08/21, 3:15 PM

## 2021-01-08 NOTE — Progress Notes (Signed)
PROGRESS NOTE    Sara Bright  XHB:[id number] DOB: 10/24/1940 DOA: 01/02/2021 PCP: Anthonette Legato, MD    Brief Narrative:   80 year old female with past medical history of diabetes, GERD, hyperlipidemia, essential hypertension, neuropathy, hypercalcemia, secondary hyperparathyroidism and chronic kidney disease presented with altered mental status.  She was found to have a COVID infection and completed 5 days of remdesivir.  She was found to have ESBL Klebsiella UTI.  Initially was on Rocephin but then antibiotics had to be switched over to meropenem and now on Zosyn.  Antibiotics completed. Patient currently pending transfer to nursing home, quarantine for COVID ends on 11/7.   Assessment & Plan:   Principal Problem:   AMS (altered mental status) Active Problems:   Anemia due to stage 3 chronic kidney disease (HCC)   CKD stage 3 due to type 2 diabetes mellitus (HCC)   Diabetes (HCC)   Essential hypertension   GERD (gastroesophageal reflux disease)   IDA (iron deficiency anemia)   COVID-19 virus infection   Acute respiratory failure with hypoxia (HCC)   Urinary tract infection due to ESBL Klebsiella   Primary hyperparathyroidism ([place])   CKD stage 4 due to type 2 diabetes mellitus ([place])   Secondary hyperparathyroidism ([place])   Slurred speech   Anemia of chronic disease   Swelling  ESBL Klebsiella acute cystitis with hematuria. Acute metabolic encephalopathy secondary to UTI. Condition has improved.  Antibiotic completed.  Mental status improved.  COVID-19 infection. Patient does not have hypoxemia, has completed 5 days of remdesivir.  Acute kidney injury on chronic kidney disease stage IIIa. Secondary hyperparathyroidism with hypercalcemia. Anemia of chronic disease. Renal function has improved. B12 level and iron level are adequate.  Hemoglobin is lower today, recheck level tomorrow, transfuse as needed.  Type 2 diabetes with chronic kidney disease. Continue current  regimen.  Morbid obesity.  Mild bilateral lower extremity edema. Duplex ultrasound did not show any DVT.       DVT prophylaxis: Heparin Code Status: full Family Communication: Daughter at bedside. Disposition Plan:    Status is: Inpatient  Remains inpatient appropriate because: Unsafe discharge        I/O last 3 completed shifts: In: 1800.1 [P.O.:240; I.V.:1335; IV Piggyback:225.1] Out: 1800 [Urine:1800] Total I/O In: -  Out: 400 [Urine:400]     Consultants:  None  Procedures: None  Antimicrobials: None  Subjective: Patient doing well today, she denies any short of breath or cough. She has good appetite, no nausea vomiting or diarrhea. No fever or chills. No dysuria hematuria.  Objective: Vitals:   01/07/21 2210 01/08/21 0632 01/08/21 0724 01/08/21 1249  BP: (!) 149/55 (!) 140/54 (!) 156/55 138/65  Pulse: 74 73 77 64  Resp: 18 16 18 18   Temp: 98.1 F (36.7 C) 98 F (36.7 C) 99.2 F (37.3 C) 98 F (36.7 C)  TempSrc:   Oral   SpO2: 100% 98% 100% 100%  Weight:      Height:        Intake/Output Summary (Last 24 hours) at 01/08/2021 1300 Last data filed at 01/08/2021 0853 Gross per 24 hour  Intake 250.96 ml  Output 650 ml  Net -399.04 ml   Filed Weights   01/02/21 1451 01/03/21 1957 01/03/21 1957  Weight: 110.3 kg 109.3 kg 109.3 kg    Examination:  General exam: Appears calm and comfortable  Respiratory system: Clear to auscultation. Respiratory effort normal. Cardiovascular system: S1 & S2 heard, RRR. No JVD, murmurs, rubs, gallops or clicks. No pedal  edema. Gastrointestinal system: Abdomen is nondistended, soft and nontender. No organomegaly or masses felt. Normal bowel sounds heard. Central nervous system: Alert and oriented x3. No focal neurological deficits. Extremities: Symmetric 5 x 5 power. Skin: No rashes, lesions or ulcers Psychiatry: Judgement and insight appear normal. Mood & affect appropriate.     Data Reviewed: I have  personally reviewed following labs and imaging studies  CBC: Recent Labs  Lab 01/02/21 1504 01/02/21 2227 01/04/21 0500 01/05/21 0439 01/06/21 0535 01/08/21 0615  WBC 8.6 8.9 7.0  --   --  5.4  HGB 8.3* 8.1* 7.7* 7.8* 8.3* 7.4*  HCT 25.5* 24.7* 24.5*  --   --  23.6*  MCV 103.2* 104.2* 102.9*  --   --  103.1*  PLT 190 173 180  --   --  397   Basic Metabolic Panel: Recent Labs  Lab 01/03/21 1658 01/04/21 0500 01/05/21 0439 01/06/21 0535 01/07/21 0633 01/08/21 0615  NA  --  136 139 143 141 139  K  --  3.5 3.6 3.7 3.8 3.6  CL  --  99 104 109 109 106  CO2  --  26 24 23 25 26   GLUCOSE  --  125* 121* 169* 187* 140*  BUN  --  77* 65* 43* 29* 21  CREATININE  --  3.59* 2.47* 1.62* 1.39* 1.39*  CALCIUM  --  10.5* 10.3 10.6* 10.2 10.2  PHOS 4.2  --   --   --   --   --    GFR: Estimated Creatinine Clearance: 37.7 mL/min (A) (by C-G formula based on SCr of 1.39 mg/dL (H)). Liver Function Tests: Recent Labs  Lab 01/03/21 0626 01/04/21 0500 01/05/21 0439 01/06/21 0535 01/07/21 0633  AST 28 31 30 28 25   ALT 16 16 15 17 14   ALKPHOS 58 53 51 50 48  BILITOT 1.1 0.7 0.9 0.8 0.8  PROT 6.9 6.2* 5.9* 6.2* 5.7*  ALBUMIN 2.6* 2.3* 2.3* 2.7* 2.4*   No results for input(s): LIPASE, AMYLASE in the last 168 hours. No results for input(s): AMMONIA in the last 168 hours. Coagulation Profile: No results for input(s): INR, PROTIME in the last 168 hours. Cardiac Enzymes: Recent Labs  Lab 01/02/21 2015  CKTOTAL 84   BNP (last 3 results) No results for input(s): PROBNP in the last 8760 hours. HbA1C: No results for input(s): HGBA1C in the last 72 hours. CBG: Recent Labs  Lab 01/07/21 1650 01/07/21 2213 01/08/21 0729 01/08/21 0858 01/08/21 1249  GLUCAP 286* 257* 130* 150* 161*   Lipid Profile: No results for input(s): CHOL, HDL, LDLCALC, TRIG, CHOLHDL, LDLDIRECT in the last 72 hours. Thyroid Function Tests: No results for input(s): TSH, T4TOTAL, FREET4, T3FREE, THYROIDAB in  the last 72 hours. Anemia Panel: Recent Labs    01/06/21 0535 01/08/21 0826  VITAMINB12  --  1,244*  FERRITIN 403*  --   TIBC 270  --   IRON 31  --    Sepsis Labs: No results for input(s): PROCALCITON, LATICACIDVEN in the last 168 hours.  Recent Results (from the past 240 hour(s))  Resp Panel by RT-PCR (Flu A&B, Covid) Nasopharyngeal Swab     Status: Abnormal   Collection Time: 01/02/21  4:55 PM   Specimen: Nasopharyngeal Swab; Nasopharyngeal(NP) swabs in vial transport medium  Result Value Ref Range Status   SARS Coronavirus 2 by RT PCR POSITIVE (A) NEGATIVE Corrected    Comment: RESULT CALLED TO, READ BACK BY AND VERIFIED WITH: [place] 01/02/21 MU (  NOTE) SARS-CoV-2 target nucleic acids are DETECTED.  The SARS-CoV-2 RNA is generally detectable in upper respiratory specimens during the acute phase of infection. Positive results are indicative of the presence of the identified virus, but do not rule out bacterial infection or co-infection with other pathogens not detected by the test. Clinical correlation with patient history and other diagnostic information is necessary to determine patient infection status. The expected result is Negative.  Fact Sheet for Patients: EntrepreneurPulse.com.au  Fact Sheet for Healthcare Providers: IncredibleEmployment.be  This test is not yet approved or cleared by the [place] FDA and  has been authorized for detection and/or diagnosis of SARS-CoV-2 by FDA under an Emergency Use Authorization (EUA).  This EUA will remain in effect (meaning this test can be used ) for the duration of  the COVID-19 declaration under Section 564(b)(1) of the Act, 21 U.S.C. section 360bbb-3(b)(1), unless the authorization is terminated or revoked sooner.  CORRECTED ON 10/27 AT 1952: PREVIOUSLY REPORTED AS POSITIVE CRITICAL RESULT CALLED TO, READ BACK BY AND VERIFIED WITH: [place] 01/02/21 MU     Influenza A by PCR NEGATIVE NEGATIVE Final   Influenza B by PCR NEGATIVE NEGATIVE Final    Comment: (NOTE) The Xpert Xpress SARS-CoV-2/FLU/RSV plus assay is intended as an aid in the diagnosis of influenza from Nasopharyngeal swab specimens and should not be used as a sole basis for treatment. Nasal washings and aspirates are unacceptable for Xpert Xpress SARS-CoV-2/FLU/RSV testing.  Fact Sheet for Patients: EntrepreneurPulse.com.au  Fact Sheet for Healthcare Providers: IncredibleEmployment.be  This test is not yet approved or cleared by the [place] FDA and has been authorized for detection and/or diagnosis of SARS-CoV-2 by FDA under an Emergency Use Authorization (EUA). This EUA will remain in effect (meaning this test can be used) for the duration of the COVID-19 declaration under Section 564(b)(1) of the Act, 21 U.S.C. section 360bbb-3(b)(1), unless the authorization is terminated or revoked.  Performed at [facility], [address], [place], [place] [address]   Urine Culture     Status: Abnormal   Collection Time: 01/02/21  6:00 PM   Specimen: Urine, Clean Catch  Result Value Ref Range Status   Specimen Description   Final    URINE, CLEAN CATCH Performed at [facility], [address] [place], [place] [address]    Special Requests   Final    NONE Performed at [facility], [address] [place], [place] [address]    Culture (A)  Final    >=100,000 COLONIES/mL KLEBSIELLA PNEUMONIAE Confirmed Extended Spectrum Beta-Lactamase Producer (ESBL).  In bloodstream infections from ESBL organisms, carbapenems are preferred over piperacillin/tazobactam. They are shown to have a lower risk of mortality.    Report Status 01/04/2021 FINAL  Final   Organism ID, Bacteria KLEBSIELLA PNEUMONIAE (A)  Final      Susceptibility   Klebsiella pneumoniae - MIC*    AMPICILLIN >=32 RESISTANT Resistant     CEFAZOLIN  >=64 RESISTANT Resistant     CEFEPIME >=32 RESISTANT Resistant     CEFTRIAXONE >=64 RESISTANT Resistant     CIPROFLOXACIN 0.5 INTERMEDIATE Intermediate     GENTAMICIN <=1 SENSITIVE Sensitive     IMIPENEM <=0.25 SENSITIVE Sensitive     NITROFURANTOIN 64 INTERMEDIATE Intermediate     TRIMETH/SULFA >=320 RESISTANT Resistant     AMPICILLIN/SULBACTAM 16 INTERMEDIATE Intermediate     PIP/TAZO <=4 SENSITIVE Sensitive     * >=100,000 COLONIES/mL KLEBSIELLA PNEUMONIAE  Radiology Studies: CT HEAD WO CONTRAST (5MM)  Result Date: 01/06/2021 CLINICAL DATA:  Neuro deficit, acute, stroke suspected; new difficulty swallowing EXAM: CT HEAD WITHOUT CONTRAST TECHNIQUE: Contiguous axial images were obtained from the base of the skull through the vertex without intravenous contrast. COMPARISON:  01/02/2021 FINDINGS: Brain: There is no acute intracranial hemorrhage, mass effect, or edema. Gray-white differentiation is preserved. There is no extra-axial fluid collection. Ventricles and sulci are stable in size and configuration. Vascular: There is atherosclerotic calcification at the skull base. Skull: Calvarium is unremarkable. Sinuses/Orbits: No acute finding. Other: None. IMPRESSION: No acute intracranial abnormality. Electronically Signed   By: Macy Mis M.D.   On: 01/06/2021 15:27   US Venous Img Lower Bilateral (DVT)  Result Date: 01/08/2021 CLINICAL DATA:  Bilateral lower extremity edema and COVID-19 infection. EXAM: BILATERAL LOWER EXTREMITY VENOUS DOPPLER ULTRASOUND TECHNIQUE: Gray-scale sonography with graded compression, as well as color Doppler and duplex ultrasound were performed to evaluate the lower extremity deep venous systems from the level of the common femoral vein and including the common femoral, femoral, profunda femoral, popliteal and calf veins including the posterior tibial, peroneal and gastrocnemius veins when visible. The superficial great saphenous vein was also  interrogated. Spectral Doppler was utilized to evaluate flow at rest and with distal augmentation maneuvers in the common femoral, femoral and popliteal veins. COMPARISON:  None. FINDINGS: RIGHT LOWER EXTREMITY Common Femoral Vein: No evidence of thrombus. Normal compressibility, respiratory phasicity and response to augmentation. Saphenofemoral Junction: No evidence of thrombus. Normal compressibility and flow on color Doppler imaging. Profunda Femoral Vein: No evidence of thrombus. Normal compressibility and flow on color Doppler imaging. Femoral Vein: No evidence of thrombus. Normal compressibility, respiratory phasicity and response to augmentation. Popliteal Vein: No evidence of thrombus. Normal compressibility, respiratory phasicity and response to augmentation. Calf Veins: No evidence of thrombus. Normal compressibility and flow on color Doppler imaging. Superficial Great Saphenous Vein: No evidence of thrombus. Normal compressibility. Venous Reflux:  None. Other Findings: No evidence of superficial thrombophlebitis or abnormal fluid collection. LEFT LOWER EXTREMITY Common Femoral Vein: No evidence of thrombus. Normal compressibility, respiratory phasicity and response to augmentation. Saphenofemoral Junction: No evidence of thrombus. Normal compressibility and flow on color Doppler imaging. Profunda Femoral Vein: No evidence of thrombus. Normal compressibility and flow on color Doppler imaging. Femoral Vein: No evidence of thrombus. Normal compressibility, respiratory phasicity and response to augmentation. Popliteal Vein: No evidence of thrombus. Normal compressibility, respiratory phasicity and response to augmentation. Calf Veins: No evidence of thrombus. Normal compressibility and flow on color Doppler imaging. Superficial Great Saphenous Vein: No evidence of thrombus. Normal compressibility. Venous Reflux:  None. Other Findings: No evidence of superficial thrombophlebitis or abnormal fluid collection.  IMPRESSION: No evidence of deep venous thrombosis in either lower extremity. Electronically Signed   By: Aletta Edouard M.D.   On: 01/08/2021 10:55        Scheduled Meds:  allopurinol  100 mg Oral Daily   amLODipine  10 mg Oral Daily   vitamin C  500 mg Oral Daily   aspirin EC  81 mg Oral Daily   carvedilol  12.5 mg Oral BID WC   Chlorhexidine Gluconate Cloth  6 each Topical Daily   cholecalciferol  1,000 Units Oral Daily   ferrous sulfate  325 mg Oral Daily   gemfibrozil  600 mg Oral Daily   heparin  5,000 Units Subcutaneous Q8H   insulin aspart  0-15 Units Subcutaneous TID WC   insulin aspart  4 Units  Subcutaneous TID WC   insulin glargine-yfgn  12 Units Subcutaneous QHS   ipratropium  2 puff Inhalation Q6H   latanoprost  1 drop Both Eyes QHS   mouth rinse  15 mL Mouth Rinse BID   multivitamin with minerals  1 tablet Oral Daily   pantoprazole  40 mg Oral QHS   Ensure Max Protein  237 mL Oral BID BM   zinc sulfate  220 mg Oral Daily   Continuous Infusions:  piperacillin-tazobactam (ZOSYN)  IV 3.375 g (01/08/21 0618)     LOS: 6 days    Time spent: 28 minutes    Sharen Hones, MD [facility]   To contact the attending provider between 7A-7P or the covering provider during after hours 7P-7A, please log into the web site [email or web address] and access using universal [place] password for that web site. If you do not have the password, please call the hospital operator.  01/08/2021, 1:00 PM

## 2021-01-08 NOTE — Progress Notes (Signed)
Speech Language Pathology Treatment: Dysphagia  Patient Details Name: Sara Bright MRN: [id number] DOB: 08/27/1940 Today's Date: 01/08/2021 Time: 1245-8099 SLP Time Calculation (min) (ACUTE ONLY): 40 min  Assessment / Plan / Recommendation Clinical Impression  Pt seen for ongoing assessment of swallowing. She is alert, verbally responsive and engaged in conversation w/ SLP. MD reported improved s/s w/ speech clear/intelligible. Initially admitted with acute metabolic encephalopathy, UTI and COVID-19 infection -- this has improved per MD notes.  Pt is on RA; wbc wnl. Dentures in place. Pt explained general aspiration precautions and agreed verbally to the need for following them especially sitting upright for all oral intake -- supported behind the back for full upright sitting. Pt assisted w/ positioning d/t min weakness. She fed herself Lunch meal of Minced foods and purees and thin liquids via cup/straw -- pt consumed several sips/ozs. NO overt clinical s/s of aspiration were noted w/ any consistency; respiratory status remained calm and unlabored, vocal quality clear b/t trials. Oral phase appeared grossly [facility] for bolus management and timely A-P transfer for swallowing; oral clearing achieved w/ all consistencies. Pt c/o a "feeling" like it was in the back of her throat; when SLP asked her to point to the "feeling", pt pointed to her mid-sternum and clavicles area. She further endorsed the food seems to "stop" her "sometimes". Per chart notes, pt's previous 2 Upper Endoscopies revealed "Z-line irregular, at the gastroesophageal junction", and "LA Grade A reflux esophagitis. Rule out Barrett's esophagus.". Suspect pt's "feelings" of discomfort w/ solid foods could be related to Esophageal phase Dysmotility. Recommend GI f/u for management; continued PPI use -- she is currently on 40mg  this admit. MD updated on above.  NSG denied any deficits in swallowing today.   Pt appears at reduced risk for  aspriation when following general aspiration precautions. Recommend continue a MINCED foods diet for ease w/ gravies added to moisten foods; Thin liquids. Recommend general aspiration precautions; Pills Whole in Puree as needed for ease of swallowing/clearing; tray setup and positioning assistance for meals. REFLUX precautions d/t pt's baseline GERD. ST services will be available for further education while admitted. NSG updated.  Education given to pt on GERD, Reflux as well as general aspiration precautions; food consistencies and prep; body reactions such as increased REFLUX behavior when stressed/illness.        HPI HPI: Per admitting H &P " Sara Bright is a 80 y.o. female seen in ed with complaints of   Hpi is per chart review and pt is somnolent and unresponsive moved to temerity until with cont cardiac monitoring. Pt was hyperglycemic was given insulin and now her sugars are in the 88's. Pt was noted to be more lethargic by family members ad well.   Pt has past medical history of polyp-adenomatous, anemia, cataracts, chronic kidney disease stage III, diabetes mellitus type 2, GERD -- see Upper Endoscopies in chart notes, glaucoma, guaiac positive stools, hyperlipidemia, hypertension, OSA, Obesity."  She is on a PPI at home baseline.      SLP Plan  Continue with current plan of care (x1)      Recommendations for follow up therapy are one component of a multi-disciplinary discharge planning process, led by the attending physician.  Recommendations may be updated based on patient status, additional functional criteria and insurance authorization.    Recommendations  Diet recommendations: Dysphagia 2 (fine chop);Thin liquid Liquids provided via: Cup;Straw Medication Administration: Whole meds with puree (if needed for ease of swallowing and clearing) Supervision: Patient  able to self feed (setup) Compensations: Minimize environmental distractions;Slow rate;Small sips/bites;Lingual sweep  for clearance of pocketing;Follow solids with liquid Postural Changes and/or Swallow Maneuvers: Out of bed for meals;Seated upright 90 degrees;Upright 30-60 min after meal (REFLUX precautions)                General recommendations:  (GI f/u fo rmanagement of GERD) Oral Care Recommendations: Oral care BID;Patient independent with oral care Follow up Recommendations: None SLP Visit Diagnosis: Dysphagia, unspecified (R13.10) Plan: Continue with current plan of care (x1)       GO                [name]  01/08/2021, 3:16 PM

## 2021-01-09 ENCOUNTER — Ambulatory Visit: Payer: Medicare HMO

## 2021-01-09 ENCOUNTER — Other Ambulatory Visit: Payer: Medicare HMO

## 2021-01-09 DIAGNOSIS — U071 COVID-19: Secondary | ICD-10-CM | POA: Diagnosis not present

## 2021-01-09 DIAGNOSIS — J9601 Acute respiratory failure with hypoxia: Secondary | ICD-10-CM | POA: Diagnosis not present

## 2021-01-09 LAB — ACETYLCHOLINE RECEPTOR AB, ALL
Acety choline binding ab: <0.03 nmol/L (ref 0.00–0.24)
Acetylchol Block Ab: 20 % (ref 0–25)

## 2021-01-09 LAB — BASIC METABOLIC PANEL
Anion gap: 6 (ref 5–15)
BUN: 17 mg/dL (ref 8–23)
CO2: 27 mmol/L (ref 22–32)
Calcium: 10.3 mg/dL (ref 8.9–10.3)
Chloride: 104 mmol/L (ref 98–111)
Creatinine, Ser: 1.3 mg/dL — ABNORMAL HIGH (ref 0.44–1.00)
GFR, Estimated: 41 mL/min — ABNORMAL LOW (ref >60)
Glucose, Bld: 122 mg/dL — ABNORMAL HIGH (ref 70–99)
Potassium: 3.7 mmol/L (ref 3.5–5.1)
Sodium: 137 mmol/L (ref 135–145)

## 2021-01-09 LAB — MAGNESIUM: Magnesium: 1.9 mg/dL (ref 1.7–2.4)

## 2021-01-09 LAB — CBC WITH DIFFERENTIAL/PLATELET
Abs Immature Granulocytes: 0.07 10*3/uL (ref 0.00–0.07)
Basophils Absolute: 0 10*3/uL (ref 0.0–0.1)
Basophils Relative: 1 %
Eosinophils Absolute: 0.2 10*3/uL (ref 0.0–0.5)
Eosinophils Relative: 4 %
HCT: 24 % — ABNORMAL LOW (ref 36.0–46.0)
Hemoglobin: 7.4 g/dL — ABNORMAL LOW (ref 12.0–15.0)
Immature Granulocytes: 1 %
Lymphocytes Relative: 36 %
Lymphs Abs: 1.9 10*3/uL (ref 0.7–4.0)
MCH: 32.2 pg (ref 26.0–34.0)
MCHC: 30.8 g/dL (ref 30.0–36.0)
MCV: 104.3 fL — ABNORMAL HIGH (ref 80.0–100.0)
Monocytes Absolute: 0.8 10*3/uL (ref 0.1–1.0)
Monocytes Relative: 15 %
Neutro Abs: 2.3 10*3/uL (ref 1.7–7.7)
Neutrophils Relative %: 43 %
Platelets: 185 10*3/uL (ref 150–400)
RBC: 2.3 MIL/uL — ABNORMAL LOW (ref 3.87–5.11)
RDW: 15.1 % (ref 11.5–15.5)
WBC: 5.4 10*3/uL (ref 4.0–10.5)
nRBC: 0 % (ref 0.0–0.2)

## 2021-01-09 LAB — GLUCOSE, CAPILLARY
Glucose-Capillary: 135 mg/dL — ABNORMAL HIGH (ref 70–99)
Glucose-Capillary: 168 mg/dL — ABNORMAL HIGH (ref 70–99)
Glucose-Capillary: 171 mg/dL — ABNORMAL HIGH (ref 70–99)
Glucose-Capillary: 238 mg/dL — ABNORMAL HIGH (ref 70–99)

## 2021-01-09 MED ORDER — PANTOPRAZOLE SODIUM 40 MG PO TBEC
40.0000 mg | DELAYED_RELEASE_TABLET | Freq: Two times a day (BID) | ORAL | Status: DC
  Administered 2021-01-09 – 2021-01-15 (×12): 40 mg via ORAL
  Filled 2021-01-09 (×12): qty 1

## 2021-01-09 NOTE — Progress Notes (Signed)
PROGRESS NOTE    SAN RUA  QIH:[id number] DOB: Jun 24, 1940 DOA: 01/02/2021 PCP: Anthonette Legato, MD    Brief Narrative:   80 year old female with past medical history of diabetes, GERD, hyperlipidemia, essential hypertension, neuropathy, hypercalcemia, secondary hyperparathyroidism and chronic kidney disease presented with altered mental status.  She was found to have a COVID infection and completed 5 days of remdesivir.  She was found to have ESBL Klebsiella UTI.  Initially was on Rocephin but then antibiotics had to be switched over to meropenem and now on Zosyn.  Antibiotics completed. Patient currently pending transfer to nursing home, quarantine for COVID ends on 11/7.    Assessment & Plan:   Principal Problem:   AMS (altered mental status) Active Problems:   Anemia due to stage 3 chronic kidney disease (HCC)   CKD stage 3 due to type 2 diabetes mellitus (HCC)   Diabetes (HCC)   Essential hypertension   GERD (gastroesophageal reflux disease)   IDA (iron deficiency anemia)   COVID-19 virus infection   Acute respiratory failure with hypoxia (HCC)   Urinary tract infection due to ESBL Klebsiella   Primary hyperparathyroidism ([place])   CKD stage 4 due to type 2 diabetes mellitus ([place])   Secondary hyperparathyroidism ([place])   Slurred speech   Anemia of chronic disease   Swelling  ESBL Klebsiella acute cystitis with hematuria. Acute metabolic encephalopathy secondary to UTI. Mental status improved, completed antibiotics.  COVID-19 infection. Completed remdesivir, no hypoxemia.  Acute kidney injury on chronic kidney disease stage IIIa. Secondary hyperparathyroidism with hypercalcemia. Anemia of chronic disease. Conditions are stable.  Dysphagia. Patient has been evaluated by speech therapy, placed on dysphagia 2 diet.  She feels food stuck in the middle of the chest, will obtain barium esophagram.  Type 2 diabetes with chronic kidney disease. Continue current  regimen.  Morbid obesity.   Mild bilateral lower extremity edema. Duplex ultrasound did not show any DVT.    DVT prophylaxis: Heparin Code Status: full Family Communication: Daughter at bedside. Disposition Plan:      Status is: Inpatient   Remains inpatient appropriate because: Unsafe discharge        I/O last 3 completed shifts: In: 334.7 [P.O.:270; IV Piggyback:64.7] Out: 1850 [Urine:1850] Total I/O In: -  Out: 300 [Urine:300]     Subjective: Patient doing well today.  No short of breath or cough. She still has some difficulty with swallowing, she is currently on dysphagia 2 diet.  But does feel food stuck in the chest. No fever chills No dysuria hematuria. Objective: Vitals:   01/09/21 0024 01/09/21 0610 01/09/21 0921 01/09/21 1125  BP: (!) 128/45 (!) 142/54 (!) 155/54 (!) 147/57  Pulse: 69 72 69 74  Resp: 16 18 16 18   Temp: 98 F (36.7 C) 98.3 F (36.8 C) 98 F (36.7 C) 98.6 F (37 C)  TempSrc:      SpO2: 100% 100% 98% 100%  Weight:      Height:        Intake/Output Summary (Last 24 hours) at 01/09/2021 1411 Last data filed at 01/09/2021 1100 Gross per 24 hour  Intake 150 ml  Output 1750 ml  Net -1600 ml   Filed Weights   01/02/21 1451 01/03/21 1957 01/03/21 1957  Weight: 110.3 kg 109.3 kg 109.3 kg    Examination:  General exam: Appears calm and comfortable  Respiratory system: Clear to auscultation. Respiratory effort normal. Cardiovascular system: S1 & S2 heard, RRR. No JVD, murmurs, rubs, gallops or clicks. No  pedal edema. Gastrointestinal system: Abdomen is nondistended, soft and nontender. No organomegaly or masses felt. Normal bowel sounds heard. Central nervous system: Alert and oriented. No focal neurological deficits. Extremities: Symmetric 5 x 5 power. Skin: No rashes, lesions or ulcers Psychiatry: Mood & affect appropriate.     Data Reviewed: I have personally reviewed following labs and imaging studies  CBC: Recent Labs   Lab 01/02/21 1504 01/02/21 2227 01/04/21 0500 01/05/21 0439 01/06/21 0535 01/08/21 0615 01/09/21 0601  WBC 8.6 8.9 7.0  --   --  5.4 5.4  NEUTROABS  --   --   --   --   --   --  2.3  HGB 8.3* 8.1* 7.7* 7.8* 8.3* 7.4* 7.4*  HCT 25.5* 24.7* 24.5*  --   --  23.6* 24.0*  MCV 103.2* 104.2* 102.9*  --   --  103.1* 104.3*  PLT 190 173 180  --   --  174 562   Basic Metabolic Panel: Recent Labs  Lab 01/03/21 1658 01/04/21 0500 01/05/21 0439 01/06/21 0535 01/07/21 0633 01/08/21 0615 01/09/21 0601  NA  --    < > 139 143 141 139 137  K  --    < > 3.6 3.7 3.8 3.6 3.7  CL  --    < > 104 109 109 106 104  CO2  --    < > 24 23 25 26 27   GLUCOSE  --    < > 121* 169* 187* 140* 122*  BUN  --    < > 65* 43* 29* 21 17  CREATININE  --    < > 2.47* 1.62* 1.39* 1.39* 1.30*  CALCIUM  --    < > 10.3 10.6* 10.2 10.2 10.3  MG  --   --   --   --   --   --  1.9  PHOS 4.2  --   --   --   --   --   --    < > = values in this interval not displayed.   GFR: Estimated Creatinine Clearance: 40.3 mL/min (A) (by C-G formula based on SCr of 1.3 mg/dL (H)). Liver Function Tests: Recent Labs  Lab 01/03/21 0626 01/04/21 0500 01/05/21 0439 01/06/21 0535 01/07/21 0633  AST 28 31 30 28 25   ALT 16 16 15 17 14   ALKPHOS 58 53 51 50 48  BILITOT 1.1 0.7 0.9 0.8 0.8  PROT 6.9 6.2* 5.9* 6.2* 5.7*  ALBUMIN 2.6* 2.3* 2.3* 2.7* 2.4*   No results for input(s): LIPASE, AMYLASE in the last 168 hours. No results for input(s): AMMONIA in the last 168 hours. Coagulation Profile: No results for input(s): INR, PROTIME in the last 168 hours. Cardiac Enzymes: Recent Labs  Lab 01/02/21 2015  CKTOTAL 84   BNP (last 3 results) No results for input(s): PROBNP in the last 8760 hours. HbA1C: No results for input(s): HGBA1C in the last 72 hours. CBG: Recent Labs  Lab 01/08/21 1249 01/08/21 1729 01/08/21 2151 01/09/21 0919 01/09/21 1121  GLUCAP 161* 203* 205* 135* 168*   Lipid Profile: No results for  input(s): CHOL, HDL, LDLCALC, TRIG, CHOLHDL, LDLDIRECT in the last 72 hours. Thyroid Function Tests: No results for input(s): TSH, T4TOTAL, FREET4, T3FREE, THYROIDAB in the last 72 hours. Anemia Panel: Recent Labs    01/08/21 0826  VITAMINB12 1,244*   Sepsis Labs: No results for input(s): PROCALCITON, LATICACIDVEN in the last 168 hours.  Recent Results (from the past 240 hour(s))  Resp  Panel by RT-PCR (Flu A&B, Covid) Nasopharyngeal Swab     Status: Abnormal   Collection Time: 01/02/21  4:55 PM   Specimen: Nasopharyngeal Swab; Nasopharyngeal(NP) swabs in vial transport medium  Result Value Ref Range Status   SARS Coronavirus 2 by RT PCR POSITIVE (A) NEGATIVE Corrected    Comment: RESULT CALLED TO, READ BACK BY AND VERIFIED WITH: HINA HELSEL 1921 01/02/21 MU (NOTE) SARS-CoV-2 target nucleic acids are DETECTED.  The SARS-CoV-2 RNA is generally detectable in upper respiratory specimens during the acute phase of infection. Positive results are indicative of the presence of the identified virus, but do not rule out bacterial infection or co-infection with other pathogens not detected by the test. Clinical correlation with patient history and other diagnostic information is necessary to determine patient infection status. The expected result is Negative.  Fact Sheet for Patients: EntrepreneurPulse.com.au  Fact Sheet for Healthcare Providers: IncredibleEmployment.be  This test is not yet approved or cleared by the [place] FDA and  has been authorized for detection and/or diagnosis of SARS-CoV-2 by FDA under an Emergency Use Authorization (EUA).  This EUA will remain in effect (meaning this test can be used ) for the duration of  the COVID-19 declaration under Section 564(b)(1) of the Act, 21 U.S.C. section 360bbb-3(b)(1), unless the authorization is terminated or revoked sooner.  CORRECTED ON 10/27 AT 1952: PREVIOUSLY REPORTED AS POSITIVE  CRITICAL RESULT CALLED TO, READ BACK BY AND VERIFIED WITH: Bettles 01/02/21 MU    Influenza A by PCR NEGATIVE NEGATIVE Final   Influenza B by PCR NEGATIVE NEGATIVE Final    Comment: (NOTE) The Xpert Xpress SARS-CoV-2/FLU/RSV plus assay is intended as an aid in the diagnosis of influenza from Nasopharyngeal swab specimens and should not be used as a sole basis for treatment. Nasal washings and aspirates are unacceptable for Xpert Xpress SARS-CoV-2/FLU/RSV testing.  Fact Sheet for Patients: EntrepreneurPulse.com.au  Fact Sheet for Healthcare Providers: IncredibleEmployment.be  This test is not yet approved or cleared by the [place] FDA and has been authorized for detection and/or diagnosis of SARS-CoV-2 by FDA under an Emergency Use Authorization (EUA). This EUA will remain in effect (meaning this test can be used) for the duration of the COVID-19 declaration under Section 564(b)(1) of the Act, 21 U.S.C. section 360bbb-3(b)(1), unless the authorization is terminated or revoked.  Performed at [facility], [address], [place], [place] [address]   Urine Culture     Status: Abnormal   Collection Time: 01/02/21  6:00 PM   Specimen: Urine, Clean Catch  Result Value Ref Range Status   Specimen Description   Final    URINE, CLEAN CATCH Performed at [facility], [address], [place], [place] [address]    Special Requests   Final    NONE Performed at [facility], [address] [place], [place] [address]    Culture (A)  Final    >=100,000 COLONIES/mL KLEBSIELLA PNEUMONIAE Confirmed Extended Spectrum Beta-Lactamase Producer (ESBL).  In bloodstream infections from ESBL organisms, carbapenems are preferred over piperacillin/tazobactam. They are shown to have a lower risk of mortality.    Report Status 01/04/2021 FINAL  Final   Organism ID, Bacteria KLEBSIELLA PNEUMONIAE (A)  Final       Susceptibility   Klebsiella pneumoniae - MIC*    AMPICILLIN >=32 RESISTANT Resistant     CEFAZOLIN >=64 RESISTANT Resistant     CEFEPIME >=32 RESISTANT Resistant     CEFTRIAXONE >=64 RESISTANT Resistant  CIPROFLOXACIN 0.5 INTERMEDIATE Intermediate     GENTAMICIN <=1 SENSITIVE Sensitive     IMIPENEM <=0.25 SENSITIVE Sensitive     NITROFURANTOIN 64 INTERMEDIATE Intermediate     TRIMETH/SULFA >=320 RESISTANT Resistant     AMPICILLIN/SULBACTAM 16 INTERMEDIATE Intermediate     PIP/TAZO <=4 SENSITIVE Sensitive     * >=100,000 COLONIES/mL KLEBSIELLA PNEUMONIAE         Radiology Studies: US Venous Img Lower Bilateral (DVT)  Result Date: 01/08/2021 CLINICAL DATA:  Bilateral lower extremity edema and COVID-19 infection. EXAM: BILATERAL LOWER EXTREMITY VENOUS DOPPLER ULTRASOUND TECHNIQUE: Gray-scale sonography with graded compression, as well as color Doppler and duplex ultrasound were performed to evaluate the lower extremity deep venous systems from the level of the common femoral vein and including the common femoral, femoral, profunda femoral, popliteal and calf veins including the posterior tibial, peroneal and gastrocnemius veins when visible. The superficial great saphenous vein was also interrogated. Spectral Doppler was utilized to evaluate flow at rest and with distal augmentation maneuvers in the common femoral, femoral and popliteal veins. COMPARISON:  None. FINDINGS: RIGHT LOWER EXTREMITY Common Femoral Vein: No evidence of thrombus. Normal compressibility, respiratory phasicity and response to augmentation. Saphenofemoral Junction: No evidence of thrombus. Normal compressibility and flow on color Doppler imaging. Profunda Femoral Vein: No evidence of thrombus. Normal compressibility and flow on color Doppler imaging. Femoral Vein: No evidence of thrombus. Normal compressibility, respiratory phasicity and response to augmentation. Popliteal Vein: No evidence of thrombus. Normal  compressibility, respiratory phasicity and response to augmentation. Calf Veins: No evidence of thrombus. Normal compressibility and flow on color Doppler imaging. Superficial Great Saphenous Vein: No evidence of thrombus. Normal compressibility. Venous Reflux:  None. Other Findings: No evidence of superficial thrombophlebitis or abnormal fluid collection. LEFT LOWER EXTREMITY Common Femoral Vein: No evidence of thrombus. Normal compressibility, respiratory phasicity and response to augmentation. Saphenofemoral Junction: No evidence of thrombus. Normal compressibility and flow on color Doppler imaging. Profunda Femoral Vein: No evidence of thrombus. Normal compressibility and flow on color Doppler imaging. Femoral Vein: No evidence of thrombus. Normal compressibility, respiratory phasicity and response to augmentation. Popliteal Vein: No evidence of thrombus. Normal compressibility, respiratory phasicity and response to augmentation. Calf Veins: No evidence of thrombus. Normal compressibility and flow on color Doppler imaging. Superficial Great Saphenous Vein: No evidence of thrombus. Normal compressibility. Venous Reflux:  None. Other Findings: No evidence of superficial thrombophlebitis or abnormal fluid collection. IMPRESSION: No evidence of deep venous thrombosis in either lower extremity. Electronically Signed   By: Aletta Edouard M.D.   On: 01/08/2021 10:55        Scheduled Meds:  allopurinol  100 mg Oral Daily   amLODipine  10 mg Oral Daily   vitamin C  500 mg Oral Daily   aspirin EC  81 mg Oral Daily   carvedilol  12.5 mg Oral BID WC   Chlorhexidine Gluconate Cloth  6 each Topical Daily   cholecalciferol  1,000 Units Oral Daily   ferrous sulfate  325 mg Oral Daily   gemfibrozil  600 mg Oral Daily   heparin  5,000 Units Subcutaneous Q8H   insulin aspart  0-15 Units Subcutaneous TID WC   insulin aspart  4 Units Subcutaneous TID WC   insulin glargine-yfgn  12 Units Subcutaneous QHS    ipratropium  2 puff Inhalation Q6H   latanoprost  1 drop Both Eyes QHS   mouth rinse  15 mL Mouth Rinse BID   multivitamin with minerals  1 tablet Oral  Daily   pantoprazole  40 mg Oral QHS   Ensure Max Protein  237 mL Oral BID BM   zinc sulfate  220 mg Oral Daily   Continuous Infusions:   LOS: 7 days    Time spent: 27 minutes    Sharen Hones, MD [facility]   To contact the attending provider between 7A-7P or the covering provider during after hours 7P-7A, please log into the web site [email or web address] and access using universal [place] password for that web site. If you do not have the password, please call the hospital operator.  01/09/2021, 2:11 PM

## 2021-01-09 NOTE — Progress Notes (Signed)
Physical Therapy Treatment Patient Details Name: Sara Bright MRN: [id number] DOB: 05/24/1940 Today's Date: 01/09/2021   History of Present Illness Sara Bright is a 80 y.o. female with history of hypertension, CKD, diabetes, OSA on CPAP who comes in with concerns for hyperglycemia and lethargy.  H/o R foot fx's.    PT Comments    Pt alert in bed, agreed to therapy and interested in getting OOB to utilize [facility]. Pt requires extensive encouragement and becomes easily disappointed and fatigued when mobility goal isn't met. Pt required mod assist for bed mobility w/ extensive cues and MAX A + 2 for STS. STS attempted numerous times and pt is unable to lift bottom from bed w/ +1 assist, RW from regular surface height. Pt education provided on current exercises to continue building strength while in bed. SNF remains appropriate d/c recommendation at this time to return to PLOF as able. Skilled PT intervention is indicated to address deficits in function, mobility, and to return to PLOF as able.      Recommendations for follow up therapy are one component of a multi-disciplinary discharge planning process, led by the attending physician.  Recommendations may be updated based on patient status, additional functional criteria and insurance authorization.  Follow Up Recommendations  Skilled nursing-short term rehab (<3 hours/day)     Assistance Recommended at Discharge Frequent or constant Supervision/Assistance  Equipment Recommendations  Other (comment) (TBD next venue of care)    Recommendations for Other Services       Precautions / Restrictions Precautions Precautions: Fall Precaution Comments: R chest port Restrictions Weight Bearing Restrictions: Yes RLE Weight Bearing: Weight bearing as tolerated Other Position/Activity Restrictions: has R LE protective boot in room (and shoe for L foot)     Mobility  Bed Mobility Overal bed mobility: Needs Assistance Bed Mobility:  Rolling Rolling: Min assist   Supine to sit: Mod assist Sit to supine: Mod assist   General bed mobility comments: MOD A for trunk and BLE returning to bed    Transfers Overall transfer level: Needs assistance Equipment used: Rolling walker (2 wheels) Transfers: Sit to/from Stand Sit to Stand: From elevated surface;Max assist;+2 physical assistance           General transfer comment: x 4 STS; Pt unable to clear bottom with + 1 assist w/ elevated bed height; +2 elevated height pt able to stand upright w/ heavy use of BUE on RW    Ambulation/Gait             General Gait Details: Pt unable to step with appropriate weight shift - deferred   Stairs             Wheelchair Mobility    Modified Rankin (Stroke Patients Only)       Balance Overall balance assessment: Needs assistance Sitting-balance support: Single extremity supported;Feet unsupported Sitting balance-Leahy Scale: Good Sitting balance - Comments: steady sitting reaching within BOS     Standing balance-Leahy Scale: Poor Standing balance comment: Able to stand statically wtih support                            Cognition Arousal/Alertness: Awake/alert Behavior During Therapy: WFL for tasks assessed/performed Overall Cognitive Status: No family/caregiver present to determine baseline cognitive functioning                                 General  Comments: A&O        Exercises General Exercises - Lower Extremity Ankle Circles/Pumps: AROM;10 reps;Supine Gluteal Sets: AROM;10 reps;Supine Heel Slides: AAROM;10 reps;Supine Straight Leg Raises: AROM;10 reps;Supine    General Comments General comments (skin integrity, edema, etc.): RLE swelling > LLE      Pertinent Vitals/Pain Pain Assessment: Faces Faces Pain Scale: Hurts even more Pain Location: L knee w/ WB Pain Descriptors / Indicators: Sore;Aching;Moaning Pain Intervention(s): Limited activity within  patient's tolerance;Monitored during session    Home Living                          Prior Function            PT Goals (current goals can now be found in the care plan section) Progress towards PT goals: Progressing toward goals    Frequency    Min 2X/week      PT Plan Current plan remains appropriate    Co-evaluation              AM-PAC PT "6 Clicks" Mobility   Outcome Measure  Help needed turning from your back to your side while in a flat bed without using bedrails?: None Help needed moving from lying on your back to sitting on the side of a flat bed without using bedrails?: A Lot Help needed moving to and from a bed to a chair (including a wheelchair)?: Total Help needed standing up from a chair using your arms (e.g., wheelchair or bedside chair)?: A Lot Help needed to walk in hospital room?: Total Help needed climbing 3-5 steps with a railing? : Total 6 Click Score: 11    End of Session Equipment Utilized During Treatment: Gait belt;Other (comment) (RLE boot and LLE shoe) Activity Tolerance: Patient tolerated treatment well Patient left: in bed;with call bell/phone within reach;with bed alarm set;with family/visitor present   PT Visit Diagnosis: Unsteadiness on feet (R26.81);Other abnormalities of gait and mobility (R26.89);Muscle weakness (generalized) (M62.81);Difficulty in walking, not elsewhere classified (R26.2);Pain Pain - Right/Left: Right Pain - part of body: Ankle and joints of foot     Time: 8299-3716 PT Time Calculation (min) (ACUTE ONLY): 54 min  Charges:                        [employer], SPT

## 2021-01-09 NOTE — TOC Progression Note (Signed)
Transition of Care [facility]) - Progression Note    Patient Details  Name: TALAJAH SLIMP MRN: [id number] Date of Birth: Apr 08, 1940  Transition of Care [facility]) CM/SW [place], RN Phone Number: 01/09/2021, 10:15 AM  Clinical Narrative:    [facility] accepted patient, daughter reviewing.    Expected Discharge Plan: [place] Barriers to Discharge: Continued Medical Work up  Expected Discharge Plan and Services Expected Discharge Plan: [place]   Discharge Planning Services: CM Consult   Living arrangements for the past 2 months: [place]                                       Social Determinants of Health (SDOH) Interventions    Readmission Risk Interventions No flowsheet data found.

## 2021-01-10 DIAGNOSIS — U071 COVID-19: Secondary | ICD-10-CM | POA: Diagnosis not present

## 2021-01-10 DIAGNOSIS — N183 Chronic kidney disease, stage 3 unspecified: Secondary | ICD-10-CM | POA: Diagnosis not present

## 2021-01-10 DIAGNOSIS — E1122 Type 2 diabetes mellitus with diabetic chronic kidney disease: Secondary | ICD-10-CM | POA: Diagnosis not present

## 2021-01-10 DIAGNOSIS — J9601 Acute respiratory failure with hypoxia: Secondary | ICD-10-CM | POA: Diagnosis not present

## 2021-01-10 LAB — GLUCOSE, CAPILLARY
Glucose-Capillary: 87 mg/dL (ref 70–99)
Glucose-Capillary: 145 mg/dL — ABNORMAL HIGH (ref 70–99)
Glucose-Capillary: 173 mg/dL — ABNORMAL HIGH (ref 70–99)
Glucose-Capillary: 189 mg/dL — ABNORMAL HIGH (ref 70–99)

## 2021-01-10 MED ORDER — LOSARTAN POTASSIUM 50 MG PO TABS
100.0000 mg | ORAL_TABLET | Freq: Every day | ORAL | Status: DC
  Administered 2021-01-10 – 2021-01-15 (×6): 100 mg via ORAL
  Filled 2021-01-10 (×6): qty 2

## 2021-01-10 NOTE — Progress Notes (Signed)
Speech Language Pathology Treatment: Dysphagia  Patient Details Name: Sara Bright MRN: [id number] DOB: 11-Nov-1940 Today's Date: 01/10/2021 Time: 0825-0905 SLP Time Calculation (min) (ACUTE ONLY): 40 min  Assessment / Plan / Recommendation Clinical Impression  Pt seen for ongoing assessment of swallowing. She is alert, verbally responsive and engaged in conversation w/ SLP. Pt seemed fatigued w/ any exertion including talking, moving about in bed to sit upright, masticating foods, tray prep. Initially admitted with acute metabolic encephalopathy, UTI and COVID-19 infection -- this has improved per MD notes. Pt is on RA; wbc wnl. Dentures in place.  Pt explained general aspiration precautions and agreed verbally to the need for following them especially sitting upright for all oral intake -- supported behind the back for full upright sitting. Pt assisted w/ positioning d/t weakness. She fed herself bites of cut boiled eggs of breakfast meal (more chopped/Minced) and purees and thin liquids via straw -- pt consumed several sips/ozs finding the liquids "easiest" to consume. She also endorsed the purees were "easy too". NO overt clinical s/s of aspiration were noted w/ any consistency; respiratory status remained calm and unlabored, vocal quality clear b/t trials. Oral phase appeared grossly [facility] for bolus management and timely A-P transfer for swallowing; oral clearing achieved w/ all consistencies. Mastication Time/Effort were increased w/ the Minced, textured foods, but grossly adequate. Again, she seemed fatigued w/ the Effort of Exertion. Pt c/o a "feeling" like it was in the back of her throat; when SLP asked her to point to the "fullness feeling", pt pointed to her mid-sternum and sternal notch areas. She further endorsed the food seems to "stop" here "sometimes". Per chart notes, pt's previous 2 Upper Endoscopies revealed "Z-line irregular, at the gastroesophageal junction", and "LA Grade A reflux  esophagitis. Rule out Barrett's esophagus.". Suspect pt's "feelings" of discomfort w/ solid foods could be related to Esophageal phase Dysmotility. Recommend GI f/u for management; continued PPI use -- she is currently on $RemoveBefo'40mg'NSEvtZaFIrg$  this admit. MD agreed w/ this stating the GI would f/u w/ pt post Discharge.  No deficits w/ swallowing noted by NSG. Recommended Pills in Puree for ease of clearing.   Pt appears at reduced risk for aspriation when following general aspiration precautions. Recommend continue a MINCED foods diet for ease and conservation of energy; w/ gravies added to moisten foods. Thin liquids. Recommend general aspiration precautions; Pills Whole in Puree (crushed as needed for ease of swallowing/clearing); tray setup/prep and positioning assistance for meals - pt SHOULD get out of bed for meals and remain Upright post meals for ~60 mins. REFLUX precautions d/t pt's baseline GERD. ST services will sign off at this time w/ MD to reconsult if new needs while admitted. Pt would benefit from f/u w/ GI b/f she upgrades to Solid foods again to r/o Esophageal Dysmotility. Recommended the above diet/recs for Discharge. MD/NSG updated.       HPI HPI: Per admitting H &P " Sara Bright is a 80 y.o. female seen in ed with complaints of   Hpi is per chart review and pt is somnolent and unresponsive moved to temerity until with cont cardiac monitoring. Pt was hyperglycemic was given insulin and now her sugars are in the 88's. Pt was noted to be more lethargic by family members ad well.   Pt has past medical history of polyp-adenomatous, anemia, cataracts, chronic kidney disease stage III, diabetes mellitus type 2, GERD -- see Upper Endoscopies in chart notes, glaucoma, guaiac positive stools, hyperlipidemia, hypertension, OSA, Obesity."  She is on a PPI at home baseline.      SLP Plan  All goals met (f/u at next venue of care; f/u w/ GI for upper endoscopy)      Recommendations for follow up therapy  are one component of a multi-disciplinary discharge planning process, led by the attending physician.  Recommendations may be updated based on patient status, additional functional criteria and insurance authorization.    Recommendations  Diet recommendations: Dysphagia 2 (fine chop);Thin liquid Liquids provided via: Cup;Straw Medication Administration: Whole meds with puree (Crushed if needed for ease of swallowing and clearing of the Esophagus) Supervision: Patient able to self feed (setup, support) Compensations: Minimize environmental distractions;Slow rate;Small sips/bites;Lingual sweep for clearance of pocketing;Follow solids with liquid Postural Changes and/or Swallow Maneuvers: Out of bed for meals;Seated upright 90 degrees;Upright 30-60 min after meal (REFLUX precautions)                General recommendations:  (GI consult and management) Oral Care Recommendations: Oral care BID;Patient independent with oral care Follow up Recommendations: None SLP Visit Diagnosis: Dysphagia, unspecified (R13.10) (Esophageal phase Dysmotility) Plan: All goals met (f/u at next venue of care; f/u w/ GI for upper endoscopy)       GO                 Orinda Kenner, MS, CCC-SLP Speech Language Pathologist Rehab Services [phone] [facility]  01/10/2021, 4:18 PM

## 2021-01-10 NOTE — Progress Notes (Signed)
PROGRESS NOTE    DIMPLES PROBUS  LSL:[id number] DOB: Oct 31, 1940 DOA: 01/02/2021 PCP: Anthonette Legato, MD    Brief Narrative:   80 year old female with past medical history of diabetes, GERD, hyperlipidemia, essential hypertension, neuropathy, hypercalcemia, secondary hyperparathyroidism and chronic kidney disease presented with altered mental status.  She was found to have a COVID infection and completed 5 days of remdesivir.  She was found to have ESBL Klebsiella UTI.  Initially was on Rocephin but then antibiotics had to be switched over to meropenem and now on Zosyn.  Antibiotics completed. Patient currently pending transfer to nursing home, quarantine for COVID ends on 11/7.   Assessment & Plan:   Principal Problem:   AMS (altered mental status) Active Problems:   Anemia due to stage 3 chronic kidney disease (HCC)   CKD stage 3 due to type 2 diabetes mellitus (HCC)   Diabetes (HCC)   Essential hypertension   GERD (gastroesophageal reflux disease)   IDA (iron deficiency anemia)   COVID-19 virus infection   Acute respiratory failure with hypoxia (HCC)   Urinary tract infection due to ESBL Klebsiella   Primary hyperparathyroidism ([place])   CKD stage 4 due to type 2 diabetes mellitus ([place])   Secondary hyperparathyroidism ([place])   Slurred speech   Anemia of chronic disease   Swelling  ESBL Klebsiella acute cystitis with hematuria. Acute metabolic encephalopathy secondary to UTI. Condition has improved, currently pending for nursing placement.  COVID-19 infection. Patient improved.  No hypoxemia.  Acute kidney injury on chronic kidney disease stage IIIa. Secondary hyperparathyroidism with hypercalcemia. Anemia of chronic disease. Conditions are stable.  Dysphagia. Discussed with speech therapist, patient doing well today.  I have increased patient PPI to twice a day for GERD.  Not able to perform esophagram due to COVID.   Type 2 diabetes with chronic kidney  disease. Mentioned in treatment plan.  Morbid obesity.    DVT prophylaxis: Heparin Code Status: full Family Communication:  Disposition Plan:      Status is: Inpatient   Remains inpatient appropriate because: Unsafe discharge         I/O last 3 completed shifts: In: 150 [P.O.:150] Out: 3020 [Urine:3020] No intake/output data recorded.     Consultants:  None  Procedures: None  Antimicrobials: None  Subjective: Spoke with the nurse, patient started eating today.  She still has baseline confusion, she still agitated.  But she is more awake. She does not have any short of breath. No cough. No fever chills No dysuria hematuria   Objective: Vitals:   01/09/21 1717 01/09/21 2244 01/10/21 0404 01/10/21 0741  BP: (!) 152/60 (!) 143/58 (!) 158/58 (!) 143/61  Pulse: 76 77 76 70  Resp: 16 15 19 18   Temp: 98 F (36.7 C) 99.5 F (37.5 C) 97.7 F (36.5 C) 99.2 F (37.3 C)  TempSrc:      SpO2: 100% 100% 100% 98%  Weight:      Height:        Intake/Output Summary (Last 24 hours) at 01/10/2021 1124 Last data filed at 01/10/2021 0404 Gross per 24 hour  Intake --  Output 1970 ml  Net -1970 ml   Filed Weights   01/02/21 1451 01/03/21 1957 01/03/21 1957  Weight: 110.3 kg 109.3 kg 109.3 kg    Examination:  General exam: Appears calm and comfortable  Respiratory system: Clear to auscultation. Respiratory effort normal. Cardiovascular system: S1 & S2 heard, RRR. No JVD, murmurs, rubs, gallops or clicks. No pedal edema. Gastrointestinal system:  Abdomen is nondistended, soft and nontender. No organomegaly or masses felt. Normal bowel sounds heard. Central nervous system: Alert and oriented x2. No focal neurological deficits. Extremities: Symmetric 5 x 5 power. Skin: No rashes, lesions or ulcers Psychiatry: Mood & affect appropriate.     Data Reviewed: I have personally reviewed following labs and imaging studies  CBC: Recent Labs  Lab 01/04/21 0500  01/05/21 0439 01/06/21 0535 01/08/21 0615 01/09/21 0601  WBC 7.0  --   --  5.4 5.4  NEUTROABS  --   --   --   --  2.3  HGB 7.7* 7.8* 8.3* 7.4* 7.4*  HCT 24.5*  --   --  23.6* 24.0*  MCV 102.9*  --   --  103.1* 104.3*  PLT 180  --   --  174 409   Basic Metabolic Panel: Recent Labs  Lab 01/03/21 1658 01/04/21 0500 01/05/21 0439 01/06/21 0535 01/07/21 0633 01/08/21 0615 01/09/21 0601  NA  --    < > 139 143 141 139 137  K  --    < > 3.6 3.7 3.8 3.6 3.7  CL  --    < > 104 109 109 106 104  CO2  --    < > 24 23 25 26 27   GLUCOSE  --    < > 121* 169* 187* 140* 122*  BUN  --    < > 65* 43* 29* 21 17  CREATININE  --    < > 2.47* 1.62* 1.39* 1.39* 1.30*  CALCIUM  --    < > 10.3 10.6* 10.2 10.2 10.3  MG  --   --   --   --   --   --  1.9  PHOS 4.2  --   --   --   --   --   --    < > = values in this interval not displayed.   GFR: Estimated Creatinine Clearance: 40.3 mL/min (A) (by C-G formula based on SCr of 1.3 mg/dL (H)). Liver Function Tests: Recent Labs  Lab 01/04/21 0500 01/05/21 0439 01/06/21 0535 01/07/21 0633  AST 31 30 28 25   ALT 16 15 17 14   ALKPHOS 53 51 50 48  BILITOT 0.7 0.9 0.8 0.8  PROT 6.2* 5.9* 6.2* 5.7*  ALBUMIN 2.3* 2.3* 2.7* 2.4*   No results for input(s): LIPASE, AMYLASE in the last 168 hours. No results for input(s): AMMONIA in the last 168 hours. Coagulation Profile: No results for input(s): INR, PROTIME in the last 168 hours. Cardiac Enzymes: No results for input(s): CKTOTAL, CKMB, CKMBINDEX, TROPONINI in the last 168 hours. BNP (last 3 results) No results for input(s): PROBNP in the last 8760 hours. HbA1C: No results for input(s): HGBA1C in the last 72 hours. CBG: Recent Labs  Lab 01/09/21 0919 01/09/21 1121 01/09/21 1719 01/09/21 2246 01/10/21 0742  GLUCAP 135* 168* 171* 238* 87   Lipid Profile: No results for input(s): CHOL, HDL, LDLCALC, TRIG, CHOLHDL, LDLDIRECT in the last 72 hours. Thyroid Function Tests: No results for  input(s): TSH, T4TOTAL, FREET4, T3FREE, THYROIDAB in the last 72 hours. Anemia Panel: Recent Labs    01/08/21 0826  VITAMINB12 1,244*   Sepsis Labs: No results for input(s): PROCALCITON, LATICACIDVEN in the last 168 hours.  Recent Results (from the past 240 hour(s))  Resp Panel by RT-PCR (Flu A&B, Covid) Nasopharyngeal Swab     Status: Abnormal   Collection Time: 01/02/21  4:55 PM   Specimen: Nasopharyngeal Swab; Nasopharyngeal(NP) swabs  in vial transport medium  Result Value Ref Range Status   SARS Coronavirus 2 by RT PCR POSITIVE (A) NEGATIVE Corrected    Comment: RESULT CALLED TO, READ BACK BY AND VERIFIED WITH: [place] 01/02/21 MU (NOTE) SARS-CoV-2 target nucleic acids are DETECTED.  The SARS-CoV-2 RNA is generally detectable in upper respiratory specimens during the acute phase of infection. Positive results are indicative of the presence of the identified virus, but do not rule out bacterial infection or co-infection with other pathogens not detected by the test. Clinical correlation with patient history and other diagnostic information is necessary to determine patient infection status. The expected result is Negative.  Fact Sheet for Patients: EntrepreneurPulse.com.au  Fact Sheet for Healthcare Providers: IncredibleEmployment.be  This test is not yet approved or cleared by the [place] FDA and  has been authorized for detection and/or diagnosis of SARS-CoV-2 by FDA under an Emergency Use Authorization (EUA).  This EUA will remain in effect (meaning this test can be used ) for the duration of  the COVID-19 declaration under Section 564(b)(1) of the Act, 21 U.S.C. section 360bbb-3(b)(1), unless the authorization is terminated or revoked sooner.  CORRECTED ON 10/27 AT 1952: PREVIOUSLY REPORTED AS POSITIVE CRITICAL RESULT CALLED TO, READ BACK BY AND VERIFIED WITH: [place] 01/02/21 MU    Influenza A by PCR  NEGATIVE NEGATIVE Final   Influenza B by PCR NEGATIVE NEGATIVE Final    Comment: (NOTE) The Xpert Xpress SARS-CoV-2/FLU/RSV plus assay is intended as an aid in the diagnosis of influenza from Nasopharyngeal swab specimens and should not be used as a sole basis for treatment. Nasal washings and aspirates are unacceptable for Xpert Xpress SARS-CoV-2/FLU/RSV testing.  Fact Sheet for Patients: EntrepreneurPulse.com.au  Fact Sheet for Healthcare Providers: IncredibleEmployment.be  This test is not yet approved or cleared by the [place] FDA and has been authorized for detection and/or diagnosis of SARS-CoV-2 by FDA under an Emergency Use Authorization (EUA). This EUA will remain in effect (meaning this test can be used) for the duration of the COVID-19 declaration under Section 564(b)(1) of the Act, 21 U.S.C. section 360bbb-3(b)(1), unless the authorization is terminated or revoked.  Performed at [facility], [address] [place], [place] [address]   Urine Culture     Status: Abnormal   Collection Time: 01/02/21  6:00 PM   Specimen: Urine, Clean Catch  Result Value Ref Range Status   Specimen Description   Final    URINE, CLEAN CATCH Performed at [facility], [address] [place], [place] [address]    Special Requests   Final    NONE Performed at [facility], [address] [place], [place] [address]    Culture (A)  Final    >=100,000 COLONIES/mL KLEBSIELLA PNEUMONIAE Confirmed Extended Spectrum Beta-Lactamase Producer (ESBL).  In bloodstream infections from ESBL organisms, carbapenems are preferred over piperacillin/tazobactam. They are shown to have a lower risk of mortality.    Report Status 01/04/2021 FINAL  Final   Organism ID, Bacteria KLEBSIELLA PNEUMONIAE (A)  Final      Susceptibility   Klebsiella pneumoniae - MIC*    AMPICILLIN >=32 RESISTANT Resistant     CEFAZOLIN >=64 RESISTANT  Resistant     CEFEPIME >=32 RESISTANT Resistant     CEFTRIAXONE >=64 RESISTANT Resistant     CIPROFLOXACIN 0.5 INTERMEDIATE Intermediate     GENTAMICIN <=1 SENSITIVE Sensitive     IMIPENEM <=0.25 SENSITIVE Sensitive     NITROFURANTOIN 64 INTERMEDIATE  Intermediate     TRIMETH/SULFA >=320 RESISTANT Resistant     AMPICILLIN/SULBACTAM 16 INTERMEDIATE Intermediate     PIP/TAZO <=4 SENSITIVE Sensitive     * >=100,000 COLONIES/mL KLEBSIELLA PNEUMONIAE         Radiology Studies: No results found.      Scheduled Meds:  allopurinol  100 mg Oral Daily   amLODipine  10 mg Oral Daily   vitamin C  500 mg Oral Daily   aspirin EC  81 mg Oral Daily   carvedilol  12.5 mg Oral BID WC   Chlorhexidine Gluconate Cloth  6 each Topical Daily   cholecalciferol  1,000 Units Oral Daily   ferrous sulfate  325 mg Oral Daily   gemfibrozil  600 mg Oral Daily   heparin  5,000 Units Subcutaneous Q8H   insulin aspart  0-15 Units Subcutaneous TID WC   insulin aspart  4 Units Subcutaneous TID WC   insulin glargine-yfgn  12 Units Subcutaneous QHS   ipratropium  2 puff Inhalation Q6H   latanoprost  1 drop Both Eyes QHS   mouth rinse  15 mL Mouth Rinse BID   multivitamin with minerals  1 tablet Oral Daily   pantoprazole  40 mg Oral BID AC   Ensure Max Protein  237 mL Oral BID BM   zinc sulfate  220 mg Oral Daily   Continuous Infusions:   LOS: 8 days    Time spent: 27 minutes    Sharen Hones, MD [facility]   To contact the attending provider between 7A-7P or the covering provider during after hours 7P-7A, please log into the web site [email or web address] and access using universal [place] password for that web site. If you do not have the password, please call the hospital operator.  01/10/2021, 11:24 AM

## 2021-01-10 NOTE — Care Management Important Message (Signed)
Important Message  Patient Details  Name: Sara Bright MRN: [id number] Date of Birth: 05-01-1940   Medicare Important Message Given:  Yes  I reviewed the Important Message from Medicare with the patient's daughter, Erling Cruz by phone [phone]). She is in agreement with the discharge plan and I asked if she would like a copy of the form and she replied, yes. I have sent her a copy via secure e-mail to [email or web address] as directed. I thanked her for her time.   Juliann Pulse A Zenas Santa 01/10/2021, 2:31 PM

## 2021-01-10 NOTE — TOC Progression Note (Addendum)
Transition of Care [facility]) - Progression Note    Patient Details  Name: KAMBER VIGNOLA MRN: [id number] Date of Birth: 08-Sep-1940  Transition of Care [facility]) CM/SW [place], RN Phone Number: 01/10/2021, 2:47 PM  Clinical Narrative:   Addendum:  9407:  Patient's daughter would like to take patient home with home health.  MD stated patient would be ready for discharge Monday, daughter will plan.  [facility] and [employer] notified, awaiting response.  Hospital bed ordered, [employer] notified.  TOC to follow   **clarification reached out for [facility] to [facility] and [facility].   Spoke with patient's daughter Baldo Ash.  She and family have not yet chosen a SNF for patient.  Daughter states that she would need to speak with Physical Therapy and MD to determine the need for SNF.  RNCM explained that Physical Therapy continues to recommend SNF, and that this is a recommendation based on their assessment of patient's condition, thus they find a need for patient to transfer to sNF.    RNCM explained that if no SNF bed is chosen, there may be a financial responsibility for inpatient costs. RNCM and patient's daughter that patient has already used 10 SNF days and has not had a wellness period and depending on the facility, there may be financial obligation of ~$180-200 per day beyond this due to Medicare allotment of SNF days.   Patient's daughter asked about taking patient home, RNCM explained home health frequency of visits and that patient would be discharged when medically able, no quarantine period is required, as it is for SNF.    Patient's daughter would like to speak with MD (MD notified, and will come to patient's room to discuss with patient and daughter), and discuss with her brother to determine the best course of action and contact RNCM.  Awaiting response from MD and family  TOC to follow to discharge.    Expected Discharge Plan: [place] Barriers to Discharge: Continued  Medical Work up  Expected Discharge Plan and Services Expected Discharge Plan: [place]   Discharge Planning Services: CM Consult   Living arrangements for the past 2 months: [place]                                       Social Determinants of Health (SDOH) Interventions    Readmission Risk Interventions No flowsheet data found.

## 2021-01-10 NOTE — TOC Progression Note (Signed)
Transition of Care [facility]) - Progression Note    Patient Details  Name: SOPHYA VANBLARCOM MRN: [id number] Date of Birth: April 27, 1940  Transition of Care [facility]) CM/SW [place], RN Phone Number: 01/10/2021, 4:14 PM  Clinical Narrative:   Rocky Morel accepted [place] as per Malachy Mood    Expected Discharge Plan: [place] Barriers to Discharge: Continued Medical Work up  Expected Discharge Plan and Services Expected Discharge Plan: [place]   Discharge Planning Services: CM Consult   Living arrangements for the past 2 months: [place]                                       Social Determinants of Health (SDOH) Interventions    Readmission Risk Interventions No flowsheet data found.

## 2021-01-10 NOTE — Progress Notes (Signed)
Physical Therapy Treatment Patient Details Name: Sara Bright MRN: [id number] DOB: [birth year] Today's Date: 01/10/2021   History of Present Illness Sara Bright is a 80 y.o. female with history of hypertension, CKD, diabetes, OSA on CPAP who comes in with concerns for hyperglycemia and lethargy.  H/o R foot fx's.    PT Comments    Pt was long sitting in bed with RN/and daughter at bedside. She required a little encouragement however once agreeable was able to fully participate. She was able to exit L side of bed with increased time and min-mod assist. HOB was slightly elevated and pt did use bed rails. Did however require mod assist to progress BLEs back into bed form EOB sitting. While pt was sitting EOB, Pt performed STS 3 x to RW. Stood ~ 1-2 minutes each trial.On 2nd/3rd trial was able to take side steps towards HOB. Pt is extremely weak but is progressing. Will need extensive PT going forward to address deficits while maximizing independence with ADLs.     Recommendations for follow up therapy are one component of a multi-disciplinary discharge planning process, led by the attending physician.  Recommendations may be updated based on patient status, additional functional criteria and insurance authorization.  Follow Up Recommendations  Skilled nursing-short term rehab (<3 hours/day) (Pt/family planning to take pt home at DC)     Assistance Recommended at Discharge Frequent or constant Supervision/Assistance  Equipment Recommendations  Other (comment)       Precautions / Restrictions Precautions Precautions: Fall Precaution Comments: R chest port Restrictions Weight Bearing Restrictions: No     Mobility  Bed Mobility Overal bed mobility: Needs Assistance Bed Mobility: Supine to Sit;Sit to Supine     Supine to sit: Min assist;Mod assist;HOB elevated Sit to supine: Mod assist   General bed mobility comments: min-mod to exit LO sid eof bed. mod assist to progress BLEs  into bed.    Transfers Overall transfer level: Needs assistance Equipment used: Rolling walker (2 wheels) Transfers: Sit to/from Stand Sit to Stand: From elevated surface;+2 safety/equipment;+2 physical assistance;Min assist;Max assist           General transfer comment: min assist +2 on first STS. progressively lowered bed height with mod assist +1 to stand the 2nd and 3rd trial. daughter present and +2 SBA for safety only    Ambulation/Gait Ambulation/Gait assistance: Mod assist Gait Distance (Feet): 3 Feet Assistive device: Rolling walker (2 wheels) Gait Pattern/deviations: Step-to pattern Gait velocity: decreased   General Gait Details: pt was able to take 3 side steps towards HOB 2 x but does fatigue quickly with poor carryover     Balance Overall balance assessment: Needs assistance Sitting-balance support: Bilateral upper extremity supported;Feet supported Sitting balance-Leahy Scale: Good Sitting balance - Comments: no LOB in sitting   Standing balance support: Bilateral upper extremity supported;Reliant on assistive device for balance Standing balance-Leahy Scale: Fair       Cognition Arousal/Alertness: Awake/alert Behavior During Therapy: WFL for tasks assessed/performed Overall Cognitive Status: No family/caregiver present to determine baseline cognitive functioning      General Comments: A&O x 4               Pertinent Vitals/Pain Pain Assessment: No/denies pain Faces Pain Scale: No hurt Breathing: normal Negative Vocalization: none Facial Expression: smiling or inexpressive Body Language: relaxed Consolability: no need to console PAINAD Score: 0 Pain Location: L knee w/ WB Pain Descriptors / Indicators: Sore;Aching;Moaning Pain Intervention(s): Limited activity within patient's tolerance;Monitored during session;Premedicated before  session;Repositioned     PT Goals (current goals can now be found in the care plan section) Acute Rehab PT  Goals Patient Stated Goal: to go home. Progress towards PT goals: Progressing toward goals    Frequency    Min 2X/week      PT Plan Current plan remains appropriate       AM-PAC PT "6 Clicks" Mobility   Outcome Measure  Help needed turning from your back to your side while in a flat bed without using bedrails?: None Help needed moving from lying on your back to sitting on the side of a flat bed without using bedrails?: A Lot Help needed moving to and from a bed to a chair (including a wheelchair)?: A Lot Help needed standing up from a chair using your arms (e.g., wheelchair or bedside chair)?: A Lot Help needed to walk in hospital room?: A Lot Help needed climbing 3-5 steps with a railing? : Total 6 Click Score: 13    End of Session Equipment Utilized During Treatment: Gait belt;Other (comment) (Aircast/cam boot RLE) Activity Tolerance: Patient tolerated treatment well Patient left: in bed;with call bell/phone within reach;with bed alarm set;with family/visitor present Nurse Communication: Mobility status;Precautions;Weight bearing status PT Visit Diagnosis: Unsteadiness on feet (R26.81);Other abnormalities of gait and mobility (R26.89);Muscle weakness (generalized) (M62.81);Difficulty in walking, not elsewhere classified (R26.2);Pain Pain - Right/Left: Right Pain - part of body: Ankle and joints of foot     Time: 2122-4825 PT Time Calculation (min) (ACUTE ONLY): 36 min  Charges:  $Therapeutic Activity: 23-37 mins                    Julaine Fusi PTA 01/10/21, 5:16 PM

## 2021-01-11 ENCOUNTER — Inpatient Hospital Stay: Payer: Medicare HMO

## 2021-01-11 LAB — URINALYSIS, COMPLETE (UACMP) WITH MICROSCOPIC
Bacteria, UA: NONE SEEN
Bilirubin Urine: NEGATIVE
Glucose, UA: 150 mg/dL — AB
Hgb urine dipstick: NEGATIVE
Ketones, ur: NEGATIVE mg/dL
Nitrite: NEGATIVE
Protein, ur: NEGATIVE mg/dL
Specific Gravity, Urine: 1.009 (ref 1.005–1.030)
pH: 6 (ref 5.0–8.0)

## 2021-01-11 LAB — BASIC METABOLIC PANEL
Anion gap: 7 (ref 5–15)
BUN: 15 mg/dL (ref 8–23)
CO2: 27 mmol/L (ref 22–32)
Calcium: 11 mg/dL — ABNORMAL HIGH (ref 8.9–10.3)
Chloride: 101 mmol/L (ref 98–111)
Creatinine, Ser: 1.15 mg/dL — ABNORMAL HIGH (ref 0.44–1.00)
GFR, Estimated: 48 mL/min — ABNORMAL LOW (ref >60)
Glucose, Bld: 106 mg/dL — ABNORMAL HIGH (ref 70–99)
Potassium: 4.4 mmol/L (ref 3.5–5.1)
Sodium: 135 mmol/L (ref 135–145)

## 2021-01-11 LAB — CBC WITH DIFFERENTIAL/PLATELET
Abs Immature Granulocytes: 0.09 10*3/uL — ABNORMAL HIGH (ref 0.00–0.07)
Basophils Absolute: 0.1 10*3/uL (ref 0.0–0.1)
Basophils Relative: 1 %
Eosinophils Absolute: 0.2 10*3/uL (ref 0.0–0.5)
Eosinophils Relative: 2 %
HCT: 27.6 % — ABNORMAL LOW (ref 36.0–46.0)
Hemoglobin: 8.4 g/dL — ABNORMAL LOW (ref 12.0–15.0)
Immature Granulocytes: 1 %
Lymphocytes Relative: 21 %
Lymphs Abs: 1.5 10*3/uL (ref 0.7–4.0)
MCH: 31.7 pg (ref 26.0–34.0)
MCHC: 30.4 g/dL (ref 30.0–36.0)
MCV: 104.2 fL — ABNORMAL HIGH (ref 80.0–100.0)
Monocytes Absolute: 0.9 10*3/uL (ref 0.1–1.0)
Monocytes Relative: 12 %
Neutro Abs: 4.5 10*3/uL (ref 1.7–7.7)
Neutrophils Relative %: 63 %
Platelets: 266 10*3/uL (ref 150–400)
RBC: 2.65 MIL/uL — ABNORMAL LOW (ref 3.87–5.11)
RDW: 15.3 % (ref 11.5–15.5)
WBC: 7.3 10*3/uL (ref 4.0–10.5)
nRBC: 0 % (ref 0.0–0.2)

## 2021-01-11 LAB — GLUCOSE, CAPILLARY
Glucose-Capillary: 74 mg/dL (ref 70–99)
Glucose-Capillary: 257 mg/dL — ABNORMAL HIGH (ref 70–99)
Glucose-Capillary: 197 mg/dL — ABNORMAL HIGH (ref 70–99)
Glucose-Capillary: 139 mg/dL — ABNORMAL HIGH (ref 70–99)

## 2021-01-11 LAB — PROCALCITONIN: Procalcitonin: <0.1 ng/mL

## 2021-01-11 MED ORDER — SODIUM CHLORIDE 0.9% FLUSH: 10.0000 mL | INTRAVENOUS | Status: DC | PRN

## 2021-01-11 NOTE — Progress Notes (Signed)
PROGRESS NOTE    Sara Bright  XLK:[id number] DOB: 10-Sep-1940 DOA: 01/02/2021 PCP: Anthonette Legato, MD    Brief Narrative:   80 year old female with past medical history of diabetes, GERD, hyperlipidemia, essential hypertension, neuropathy, hypercalcemia, secondary hyperparathyroidism and chronic kidney disease presented with altered mental status.  She was found to have a COVID infection and completed 5 days of remdesivir.  She was found to have ESBL Klebsiella UTI.  Initially was on Rocephin but then antibiotics had to be switched over to meropenem and now on Zosyn.  Antibiotics completed. Patient currently pending transfer to nursing home, quarantine for COVID ends on 11/7.   Assessment & Plan:   Principal Problem:   AMS (altered mental status) Active Problems:   Anemia due to stage 3 chronic kidney disease (HCC)   CKD stage 3 due to type 2 diabetes mellitus (HCC)   Diabetes (HCC)   Essential hypertension   GERD (gastroesophageal reflux disease)   IDA (iron deficiency anemia)   COVID-19 virus infection   Acute respiratory failure with hypoxia (HCC)   Urinary tract infection due to ESBL Klebsiella   Primary hyperparathyroidism ([place])   CKD stage 4 due to type 2 diabetes mellitus ([place])   Secondary hyperparathyroidism ([place])   Slurred speech   Anemia of chronic disease   Swelling  ESBL Klebsiella acute cystitis with hematuria. Acute metabolic encephalopathy secondary to UTI. COVID infection. Condition had improved.  Patient had another episode of fever, chest x-ray did not show pneumonia, procalcitonin level less than 0.1.  We will resend UA. I will continue to monitor for now, hold off antibiotics.  This could also be due to complication of COVID.  Acute kidney injury on chronic kidney disease stage IIIa. Secondary hyperparathyroidism with hypercalcemia. Anemia of chronic disease. Conditions are stable.  Dysphagia. Outpatient work-up.  Continue increased dose of  Protonix.   Type 2 diabetes with chronic kidney disease. Continue current treatment.  Morbid obesity.    DVT prophylaxis: Heparin Code Status: full Family Communication: Had a long discussion with patient daughter yesterday, she will decide if she want take patient home versus nursing home. Disposition Plan:      Status is: Inpatient   Remains inpatient appropriate because: Unsafe discharge      I/O last 3 completed shifts: In: -  Out: 2251 [Urine:2250; Stool:1] Total I/O In: -  Out: 1600 [Urine:1600]   Consultants:  None   Procedures: None   Antimicrobials: None   Subjective: Patient had a fever yesterday evening with a max temperature of 100.7.  Patient did not have any symptoms.  No chills. She is eating well now, no nausea vomiting abdominal pain.  Last bowel movement yesterday was normal. No short of breath or cough. No dysuria or hematuria.  Objective: Vitals:   01/11/21 0049 01/11/21 0555 01/11/21 0742 01/11/21 1142  BP: (!) 144/57 (!) 126/51 (!) 124/59 (!) 145/56  Pulse: 75 69 64 72  Resp: 16 16 16 18   Temp: 100.3 F (37.9 C) 98.9 F (37.2 C) 98 F (36.7 C) 98.2 F (36.8 C)  TempSrc: Oral Oral Oral Oral  SpO2: 100% 100% 100% 100%  Weight:      Height:        Intake/Output Summary (Last 24 hours) at 01/11/2021 1337 Last data filed at 01/11/2021 1157 Gross per 24 hour  Intake --  Output 3001 ml  Net -3001 ml   Filed Weights   01/02/21 1451 01/03/21 1957 01/03/21 1957  Weight: 110.3 kg 109.3 kg  109.3 kg    Examination:  General exam: Appears calm and comfortable  Respiratory system: Clear to auscultation. Respiratory effort normal. Cardiovascular system: S1 & S2 heard, RRR. No JVD, murmurs, rubs, gallops or clicks. No pedal edema. Gastrointestinal system: Abdomen is nondistended, soft and nontender. No organomegaly or masses felt. Normal bowel sounds heard. Central nervous system: Alert and oriented x3. No focal neurological  deficits. Extremities: Symmetric 5 x 5 power. Skin: No rashes, lesions or ulcers Psychiatry: Judgement and insight appear normal. Mood & affect appropriate.     Data Reviewed: I have personally reviewed following labs and imaging studies  CBC: Recent Labs  Lab 01/05/21 0439 01/06/21 0535 01/08/21 0615 01/09/21 0601 01/11/21 0815  WBC  --   --  5.4 5.4 7.3  NEUTROABS  --   --   --  2.3 4.5  HGB 7.8* 8.3* 7.4* 7.4* 8.4*  HCT  --   --  23.6* 24.0* 27.6*  MCV  --   --  103.1* 104.3* 104.2*  PLT  --   --  174 185 536   Basic Metabolic Panel: Recent Labs  Lab 01/06/21 0535 01/07/21 0633 01/08/21 0615 01/09/21 0601 01/11/21 0815  NA 143 141 139 137 135  K 3.7 3.8 3.6 3.7 4.4  CL 109 109 106 104 101  CO2 23 25 26 27 27   GLUCOSE 169* 187* 140* 122* 106*  BUN 43* 29* 21 17 15   CREATININE 1.62* 1.39* 1.39* 1.30* 1.15*  CALCIUM 10.6* 10.2 10.2 10.3 11.0*  MG  --   --   --  1.9  --    GFR: Estimated Creatinine Clearance: 45.5 mL/min (A) (by C-G formula based on SCr of 1.15 mg/dL (H)). Liver Function Tests: Recent Labs  Lab 01/05/21 0439 01/06/21 0535 01/07/21 0633  AST 30 28 25   ALT 15 17 14   ALKPHOS 51 50 48  BILITOT 0.9 0.8 0.8  PROT 5.9* 6.2* 5.7*  ALBUMIN 2.3* 2.7* 2.4*   No results for input(s): LIPASE, AMYLASE in the last 168 hours. No results for input(s): AMMONIA in the last 168 hours. Coagulation Profile: No results for input(s): INR, PROTIME in the last 168 hours. Cardiac Enzymes: No results for input(s): CKTOTAL, CKMB, CKMBINDEX, TROPONINI in the last 168 hours. BNP (last 3 results) No results for input(s): PROBNP in the last 8760 hours. HbA1C: No results for input(s): HGBA1C in the last 72 hours. CBG: Recent Labs  Lab 01/10/21 1205 01/10/21 1644 01/10/21 2100 01/11/21 0744 01/11/21 1154  GLUCAP 145* 173* 189* 74 257*   Lipid Profile: No results for input(s): CHOL, HDL, LDLCALC, TRIG, CHOLHDL, LDLDIRECT in the last 72 hours. Thyroid  Function Tests: No results for input(s): TSH, T4TOTAL, FREET4, T3FREE, THYROIDAB in the last 72 hours. Anemia Panel: No results for input(s): VITAMINB12, FOLATE, FERRITIN, TIBC, IRON, RETICCTPCT in the last 72 hours. Sepsis Labs: Recent Labs  Lab 01/11/21 0815  PROCALCITON <0.10    Recent Results (from the past 240 hour(s))  Resp Panel by RT-PCR (Flu A&B, Covid) Nasopharyngeal Swab     Status: Abnormal   Collection Time: 01/02/21  4:55 PM   Specimen: Nasopharyngeal Swab; Nasopharyngeal(NP) swabs in vial transport medium  Result Value Ref Range Status   SARS Coronavirus 2 by RT PCR POSITIVE (A) NEGATIVE Corrected    Comment: RESULT CALLED TO, READ BACK BY AND VERIFIED WITH: HINA HELSEL 1921 01/02/21 MU (NOTE) SARS-CoV-2 target nucleic acids are DETECTED.  The SARS-CoV-2 RNA is generally detectable in upper respiratory specimens during  the acute phase of infection. Positive results are indicative of the presence of the identified virus, but do not rule out bacterial infection or co-infection with other pathogens not detected by the test. Clinical correlation with patient history and other diagnostic information is necessary to determine patient infection status. The expected result is Negative.  Fact Sheet for Patients: EntrepreneurPulse.com.au  Fact Sheet for Healthcare Providers: IncredibleEmployment.be  This test is not yet approved or cleared by the [place] FDA and  has been authorized for detection and/or diagnosis of SARS-CoV-2 by FDA under an Emergency Use Authorization (EUA).  This EUA will remain in effect (meaning this test can be used ) for the duration of  the COVID-19 declaration under Section 564(b)(1) of the Act, 21 U.S.C. section 360bbb-3(b)(1), unless the authorization is terminated or revoked sooner.  CORRECTED ON 10/27 AT 1952: PREVIOUSLY REPORTED AS POSITIVE CRITICAL RESULT CALLED TO, READ BACK BY AND VERIFIED  WITH: [place] 01/02/21 MU    Influenza A by PCR NEGATIVE NEGATIVE Final   Influenza B by PCR NEGATIVE NEGATIVE Final    Comment: (NOTE) The Xpert Xpress SARS-CoV-2/FLU/RSV plus assay is intended as an aid in the diagnosis of influenza from Nasopharyngeal swab specimens and should not be used as a sole basis for treatment. Nasal washings and aspirates are unacceptable for Xpert Xpress SARS-CoV-2/FLU/RSV testing.  Fact Sheet for Patients: EntrepreneurPulse.com.au  Fact Sheet for Healthcare Providers: IncredibleEmployment.be  This test is not yet approved or cleared by the [place] FDA and has been authorized for detection and/or diagnosis of SARS-CoV-2 by FDA under an Emergency Use Authorization (EUA). This EUA will remain in effect (meaning this test can be used) for the duration of the COVID-19 declaration under Section 564(b)(1) of the Act, 21 U.S.C. section 360bbb-3(b)(1), unless the authorization is terminated or revoked.  Performed at [facility], [address] [place], [place] [address]   Urine Culture     Status: Abnormal   Collection Time: 01/02/21  6:00 PM   Specimen: Urine, Clean Catch  Result Value Ref Range Status   Specimen Description   Final    URINE, CLEAN CATCH Performed at [facility], [address] [place], [place] [address]    Special Requests   Final    NONE Performed at [facility], [address] [place], [place] [address]    Culture (A)  Final    >=100,000 COLONIES/mL KLEBSIELLA PNEUMONIAE Confirmed Extended Spectrum Beta-Lactamase Producer (ESBL).  In bloodstream infections from ESBL organisms, carbapenems are preferred over piperacillin/tazobactam. They are shown to have a lower risk of mortality.    Report Status 01/04/2021 FINAL  Final   Organism ID, Bacteria KLEBSIELLA PNEUMONIAE (A)  Final      Susceptibility   Klebsiella pneumoniae - MIC*    AMPICILLIN  >=32 RESISTANT Resistant     CEFAZOLIN >=64 RESISTANT Resistant     CEFEPIME >=32 RESISTANT Resistant     CEFTRIAXONE >=64 RESISTANT Resistant     CIPROFLOXACIN 0.5 INTERMEDIATE Intermediate     GENTAMICIN <=1 SENSITIVE Sensitive     IMIPENEM <=0.25 SENSITIVE Sensitive     NITROFURANTOIN 64 INTERMEDIATE Intermediate     TRIMETH/SULFA >=320 RESISTANT Resistant     AMPICILLIN/SULBACTAM 16 INTERMEDIATE Intermediate     PIP/TAZO <=4 SENSITIVE Sensitive     * >=100,000 COLONIES/mL KLEBSIELLA PNEUMONIAE         Radiology Studies: DG Chest Port 1 View  Result Date: 01/11/2021 CLINICAL DATA:  Fever, COVID positive  EXAM: PORTABLE CHEST 1 VIEW COMPARISON:  01/02/2021 chest radiograph. FINDINGS: Stable right internal jugular Port-A-Cath and surgical hardware from ACDF. Stable cardiomediastinal silhouette with mild cardiomegaly. No pneumothorax. No pleural effusion. No overt pulmonary edema. No acute consolidative airspace disease. IMPRESSION: Stable mild cardiomegaly without overt pulmonary edema. No active pulmonary disease. Electronically Signed   By: Ilona Sorrel M.D.   On: 01/11/2021 11:50        Scheduled Meds:  allopurinol  100 mg Oral Daily   amLODipine  10 mg Oral Daily   vitamin C  500 mg Oral Daily   aspirin EC  81 mg Oral Daily   carvedilol  12.5 mg Oral BID WC   Chlorhexidine Gluconate Cloth  6 each Topical Daily   cholecalciferol  1,000 Units Oral Daily   ferrous sulfate  325 mg Oral Daily   gemfibrozil  600 mg Oral Daily   heparin  5,000 Units Subcutaneous Q8H   insulin aspart  0-15 Units Subcutaneous TID WC   insulin aspart  4 Units Subcutaneous TID WC   insulin glargine-yfgn  12 Units Subcutaneous QHS   ipratropium  2 puff Inhalation Q6H   latanoprost  1 drop Both Eyes QHS   losartan  100 mg Oral Daily   mouth rinse  15 mL Mouth Rinse BID   multivitamin with minerals  1 tablet Oral Daily   pantoprazole  40 mg Oral BID AC   Ensure Max Protein  237 mL Oral BID BM    zinc sulfate  220 mg Oral Daily   Continuous Infusions:   LOS: 9 days    Time spent: 28 minutes    Sharen Hones, MD [facility]   To contact the attending provider between 7A-7P or the covering provider during after hours 7P-7A, please log into the web site [email or web address] and access using universal [place] password for that web site. If you do not have the password, please call the hospital operator.  01/11/2021, 1:37 PM

## 2021-01-12 LAB — BLOOD CULTURE ID PANEL (REFLEXED) - BCID2

## 2021-01-12 LAB — GLUCOSE, CAPILLARY
Glucose-Capillary: 91 mg/dL (ref 70–99)
Glucose-Capillary: 117 mg/dL — ABNORMAL HIGH (ref 70–99)
Glucose-Capillary: 112 mg/dL — ABNORMAL HIGH (ref 70–99)
Glucose-Capillary: 163 mg/dL — ABNORMAL HIGH (ref 70–99)
Glucose-Capillary: 190 mg/dL — ABNORMAL HIGH (ref 70–99)

## 2021-01-12 MED ORDER — ENOXAPARIN SODIUM 60 MG/0.6ML IJ SOSY
0.5000 mg/kg | PREFILLED_SYRINGE | INTRAMUSCULAR | Status: DC
  Administered 2021-01-12 – 2021-01-14 (×3): 55 mg via SUBCUTANEOUS
  Filled 2021-01-12 (×3): qty 0.6

## 2021-01-12 NOTE — Progress Notes (Signed)
PHARMACY - PHYSICIAN COMMUNICATION CRITICAL VALUE ALERT - BLOOD CULTURE IDENTIFICATION (BCID)  Initial BCID results:  1 (anaerobic) of 4 bottles with Staph Species.  Pt currently not on any abx.  Pt is afebrile & WBC count WNL.  Name of provider contacted: Rachael Fee, NP  Changes to prescribed antibiotics required: No changes, continue to observe.  Renda Rolls, PharmD, [facility] 01/12/2021 6:28 AM

## 2021-01-12 NOTE — Progress Notes (Signed)
PROGRESS NOTE    DE LIBMAN  MAU:[id number] DOB: [birth year] DOA: 01/02/2021 PCP: Anthonette Legato, MD    Brief Narrative:  80 year old female with past medical history of diabetes, GERD, hyperlipidemia, essential hypertension, neuropathy, hypercalcemia, secondary hyperparathyroidism and chronic kidney disease presented with altered mental status.  She was found to have a COVID infection and completed 5 days of remdesivir.  She was found to have ESBL Klebsiella UTI.  Initially was on Rocephin but then antibiotics had to be switched over to meropenem and now on Zosyn.  Antibiotics completed. Patient currently pending transfer to nursing home, quarantine for COVID ends on 11/7.    Assessment & Plan:   Principal Problem:   AMS (altered mental status) Active Problems:   Anemia due to stage 3 chronic kidney disease (HCC)   CKD stage 3 due to type 2 diabetes mellitus (HCC)   Diabetes (HCC)   Essential hypertension   GERD (gastroesophageal reflux disease)   IDA (iron deficiency anemia)   COVID-19 virus infection   Acute respiratory failure with hypoxia (HCC)   Urinary tract infection due to ESBL Klebsiella   Primary hyperparathyroidism ([place])   CKD stage 4 due to type 2 diabetes mellitus ([place])   Secondary hyperparathyroidism ([place])   Slurred speech   Anemia of chronic disease   Swelling  ESBL Klebsiella acute cystitis with hematuria. Acute metabolic encephalopathy secondary to UTI. COVID infection. Condition so far had improved. Patient had episode of fever yesterday, she does not appear to have additional infection.  This could be secondary to complication from COVID.  Patient does not have any urinary symptoms. Hold off antibiotics.  Acute kidney injury on chronic kidney disease stage IIIa. Secondary hyperparathyroidism with hypercalcemia. Anemia of chronic disease. Stable.  Dysphagia. Outpatient work-up.  Continue increased dose of Protonix.   Type 2 diabetes with  chronic kidney disease. Continue current treatment.  Morbid obesity.   DVT prophylaxis: Lovenox Code Status: full Family Communication:  Disposition Plan:    Status is: Inpatient  Remains inpatient appropriate because: Unsafe discharge.        I/O last 3 completed shifts: In: -  Out: 4200 [Urine:3750; Emesis/NG output:450] No intake/output data recorded.     Subjective: Patient doing well, no additional fever.  Room was cold, nursing will adjust temperature. She is eating well, no longer has any dysphagia.  No nausea vomiting.  No abdominal pain. No shortness of breath or cough.  Objective: Vitals:   01/11/21 1626 01/11/21 2210 01/12/21 0600 01/12/21 0822  BP: 122/71 (!) 125/54 (!) 153/68 (!) 154/58  Pulse: 70 72 67 66  Resp: 18 16 15 18   Temp: 98 F (36.7 C) 99.5 F (37.5 C) 99.4 F (37.4 C) 99.2 F (37.3 C)  TempSrc: Oral Oral Oral Oral  SpO2: 100% 100% 100% 100%  Weight:      Height:        Intake/Output Summary (Last 24 hours) at 01/12/2021 1218 Last data filed at 01/12/2021 0557 Gross per 24 hour  Intake --  Output 2100 ml  Net -2100 ml   Filed Weights   01/02/21 1451 01/03/21 1957 01/03/21 1957  Weight: 110.3 kg 109.3 kg 109.3 kg    Examination:  General exam: Appears calm and comfortable  Respiratory system: Clear to auscultation. Respiratory effort normal. Cardiovascular system: S1 & S2 heard, RRR. No JVD, murmurs, rubs, gallops or clicks. No pedal edema. Gastrointestinal system: Abdomen is nondistended, soft and nontender. No organomegaly or masses felt. Normal bowel sounds heard. Central  nervous system: Alert and oriented x3. No focal neurological deficits. Extremities: Symmetric 5 x 5 power. Skin: No rashes, lesions or ulcers Psychiatry: Judgement and insight appear normal. Mood & affect appropriate.     Data Reviewed: I have personally reviewed following labs and imaging studies  CBC: Recent Labs  Lab 01/06/21 0535  01/08/21 0615 01/09/21 0601 01/11/21 0815  WBC  --  5.4 5.4 7.3  NEUTROABS  --   --  2.3 4.5  HGB 8.3* 7.4* 7.4* 8.4*  HCT  --  23.6* 24.0* 27.6*  MCV  --  103.1* 104.3* 104.2*  PLT  --  174 185 767   Basic Metabolic Panel: Recent Labs  Lab 01/06/21 0535 01/07/21 0633 01/08/21 0615 01/09/21 0601 01/11/21 0815  NA 143 141 139 137 135  K 3.7 3.8 3.6 3.7 4.4  CL 109 109 106 104 101  CO2 23 25 26 27 27   GLUCOSE 169* 187* 140* 122* 106*  BUN 43* 29* 21 17 15   CREATININE 1.62* 1.39* 1.39* 1.30* 1.15*  CALCIUM 10.6* 10.2 10.2 10.3 11.0*  MG  --   --   --  1.9  --    GFR: Estimated Creatinine Clearance: 45.5 mL/min (A) (by C-G formula based on SCr of 1.15 mg/dL (H)). Liver Function Tests: Recent Labs  Lab 01/06/21 0535 01/07/21 0633  AST 28 25  ALT 17 14  ALKPHOS 50 48  BILITOT 0.8 0.8  PROT 6.2* 5.7*  ALBUMIN 2.7* 2.4*   No results for input(s): LIPASE, AMYLASE in the last 168 hours. No results for input(s): AMMONIA in the last 168 hours. Coagulation Profile: No results for input(s): INR, PROTIME in the last 168 hours. Cardiac Enzymes: No results for input(s): CKTOTAL, CKMB, CKMBINDEX, TROPONINI in the last 168 hours. BNP (last 3 results) No results for input(s): PROBNP in the last 8760 hours. HbA1C: No results for input(s): HGBA1C in the last 72 hours. CBG: Recent Labs  Lab 01/11/21 0744 01/11/21 1154 01/11/21 1627 01/11/21 2213 01/12/21 0822  GLUCAP 74 257* 197* 139* 91   Lipid Profile: No results for input(s): CHOL, HDL, LDLCALC, TRIG, CHOLHDL, LDLDIRECT in the last 72 hours. Thyroid Function Tests: No results for input(s): TSH, T4TOTAL, FREET4, T3FREE, THYROIDAB in the last 72 hours. Anemia Panel: No results for input(s): VITAMINB12, FOLATE, FERRITIN, TIBC, IRON, RETICCTPCT in the last 72 hours. Sepsis Labs: Recent Labs  Lab 01/11/21 0815  PROCALCITON <0.10    Recent Results (from the past 240 hour(s))  Resp Panel by RT-PCR (Flu A&B, Covid)  Nasopharyngeal Swab     Status: Abnormal   Collection Time: 01/02/21  4:55 PM   Specimen: Nasopharyngeal Swab; Nasopharyngeal(NP) swabs in vial transport medium  Result Value Ref Range Status   SARS Coronavirus 2 by RT PCR POSITIVE (A) NEGATIVE Corrected    Comment: RESULT CALLED TO, READ BACK BY AND VERIFIED WITH: HINA HELSEL 1921 01/02/21 MU (NOTE) SARS-CoV-2 target nucleic acids are DETECTED.  The SARS-CoV-2 RNA is generally detectable in upper respiratory specimens during the acute phase of infection. Positive results are indicative of the presence of the identified virus, but do not rule out bacterial infection or co-infection with other pathogens not detected by the test. Clinical correlation with patient history and other diagnostic information is necessary to determine patient infection status. The expected result is Negative.  Fact Sheet for Patients: EntrepreneurPulse.com.au  Fact Sheet for Healthcare Providers: IncredibleEmployment.be  This test is not yet approved or cleared by the [place] and  has been authorized for detection and/or diagnosis of SARS-CoV-2 by FDA under an Emergency Use Authorization (EUA).  This EUA will remain in effect (meaning this test can be used ) for the duration of  the COVID-19 declaration under Section 564(b)(1) of the Act, 21 U.S.C. section 360bbb-3(b)(1), unless the authorization is terminated or revoked sooner.  CORRECTED ON 10/27 AT 1952: PREVIOUSLY REPORTED AS POSITIVE CRITICAL RESULT CALLED TO, READ BACK BY AND VERIFIED WITH: Haring 01/02/21 MU    Influenza A by PCR NEGATIVE NEGATIVE Final   Influenza B by PCR NEGATIVE NEGATIVE Final    Comment: (NOTE) The Xpert Xpress SARS-CoV-2/FLU/RSV plus assay is intended as an aid in the diagnosis of influenza from Nasopharyngeal swab specimens and should not be used as a sole basis for treatment. Nasal washings and aspirates are  unacceptable for Xpert Xpress SARS-CoV-2/FLU/RSV testing.  Fact Sheet for Patients: EntrepreneurPulse.com.au  Fact Sheet for Healthcare Providers: IncredibleEmployment.be  This test is not yet approved or cleared by the [place] FDA and has been authorized for detection and/or diagnosis of SARS-CoV-2 by FDA under an Emergency Use Authorization (EUA). This EUA will remain in effect (meaning this test can be used) for the duration of the COVID-19 declaration under Section 564(b)(1) of the Act, 21 U.S.C. section 360bbb-3(b)(1), unless the authorization is terminated or revoked.  Performed at [facility], [address] [place], [place] [address]   Urine Culture     Status: Abnormal   Collection Time: 01/02/21  6:00 PM   Specimen: Urine, Clean Catch  Result Value Ref Range Status   Specimen Description   Final    URINE, CLEAN CATCH Performed at [facility], [address], [place], [place] [address]    Special Requests   Final    NONE Performed at [facility], [address] [place], [place] [address]    Culture (A)  Final    >=100,000 COLONIES/mL KLEBSIELLA PNEUMONIAE Confirmed Extended Spectrum Beta-Lactamase Producer (ESBL).  In bloodstream infections from ESBL organisms, carbapenems are preferred over piperacillin/tazobactam. They are shown to have a lower risk of mortality.    Report Status 01/04/2021 FINAL  Final   Organism ID, Bacteria KLEBSIELLA PNEUMONIAE (A)  Final      Susceptibility   Klebsiella pneumoniae - MIC*    AMPICILLIN >=32 RESISTANT Resistant     CEFAZOLIN >=64 RESISTANT Resistant     CEFEPIME >=32 RESISTANT Resistant     CEFTRIAXONE >=64 RESISTANT Resistant     CIPROFLOXACIN 0.5 INTERMEDIATE Intermediate     GENTAMICIN <=1 SENSITIVE Sensitive     IMIPENEM <=0.25 SENSITIVE Sensitive     NITROFURANTOIN 64 INTERMEDIATE Intermediate     TRIMETH/SULFA >=320 RESISTANT Resistant      AMPICILLIN/SULBACTAM 16 INTERMEDIATE Intermediate     PIP/TAZO <=4 SENSITIVE Sensitive     * >=100,000 COLONIES/mL KLEBSIELLA PNEUMONIAE  CULTURE, BLOOD (ROUTINE X 2) w Reflex to ID Panel     Status: None (Preliminary result)   Collection Time: 01/11/21  8:31 AM   Specimen: BLOOD  Result Value Ref Range Status   Specimen Description BLOOD RIGHT ARM  Final   Special Requests   Final    BOTTLES DRAWN AEROBIC AND ANAEROBIC Blood Culture adequate volume   Culture   Final    NO GROWTH < 24 HOURS Performed at [facility], [address] [place], [place] [address]    Report Status PENDING  Incomplete  CULTURE, BLOOD (ROUTINE X 2) w Reflex to  ID Panel     Status: None (Preliminary result)   Collection Time: 01/11/21  8:31 AM   Specimen: BLOOD  Result Value Ref Range Status   Specimen Description BLOOD RIGHT HAND  Final   Special Requests   Final    BOTTLES DRAWN AEROBIC AND ANAEROBIC Blood Culture adequate volume   Culture  Setup Time   Final    Organism ID to follow ANAEROBIC BOTTLE ONLY GRAM POSITIVE COCCI CRITICAL RESULT CALLED TO, READ BACK BY AND VERIFIED WITH: NATHAN BELUE AT 9735 01/12/21.PMF Performed at [facility], [address] [place], [place] [address]    Culture [facility] POSITIVE COCCI  Final   Report Status PENDING  Incomplete  Blood Culture ID Panel (Reflexed)     Status: Abnormal   Collection Time: 01/11/21  8:31 AM  Result Value Ref Range Status   Enterococcus faecalis NOT DETECTED NOT DETECTED Final   Enterococcus Faecium NOT DETECTED NOT DETECTED Final   Listeria monocytogenes NOT DETECTED NOT DETECTED Final   Staphylococcus species DETECTED (A) NOT DETECTED Final    Comment: CRITICAL RESULT CALLED TO, READ BACK BY AND VERIFIED WITH: NATHAN BELUE AT 4268 01/12/21.PMF    Staphylococcus aureus (BCID) NOT DETECTED NOT DETECTED Final   Staphylococcus epidermidis NOT DETECTED NOT DETECTED Final   Staphylococcus lugdunensis NOT DETECTED NOT DETECTED  Final   Streptococcus species NOT DETECTED NOT DETECTED Final   Streptococcus agalactiae NOT DETECTED NOT DETECTED Final   Streptococcus pneumoniae NOT DETECTED NOT DETECTED Final   Streptococcus pyogenes NOT DETECTED NOT DETECTED Final   A.calcoaceticus-baumannii NOT DETECTED NOT DETECTED Final   Bacteroides fragilis NOT DETECTED NOT DETECTED Final   Enterobacterales NOT DETECTED NOT DETECTED Final   Enterobacter cloacae complex NOT DETECTED NOT DETECTED Final   Escherichia coli NOT DETECTED NOT DETECTED Final   Klebsiella aerogenes NOT DETECTED NOT DETECTED Final   Klebsiella oxytoca NOT DETECTED NOT DETECTED Final   Klebsiella pneumoniae NOT DETECTED NOT DETECTED Final   Proteus species NOT DETECTED NOT DETECTED Final   Salmonella species NOT DETECTED NOT DETECTED Final   Serratia marcescens NOT DETECTED NOT DETECTED Final   Haemophilus influenzae NOT DETECTED NOT DETECTED Final   Neisseria meningitidis NOT DETECTED NOT DETECTED Final   Pseudomonas aeruginosa NOT DETECTED NOT DETECTED Final   Stenotrophomonas maltophilia NOT DETECTED NOT DETECTED Final   Candida albicans NOT DETECTED NOT DETECTED Final   Candida auris NOT DETECTED NOT DETECTED Final   Candida glabrata NOT DETECTED NOT DETECTED Final   Candida krusei NOT DETECTED NOT DETECTED Final   Candida parapsilosis NOT DETECTED NOT DETECTED Final   Candida tropicalis NOT DETECTED NOT DETECTED Final   Cryptococcus neoformans/gattii NOT DETECTED NOT DETECTED Final    Comment: Performed at [facility], [address] [place], [place] [address]         Radiology Studies: DG Chest Port 1 View  Result Date: 01/11/2021 CLINICAL DATA:  Fever, COVID positive EXAM: PORTABLE CHEST 1 VIEW COMPARISON:  01/02/2021 chest radiograph. FINDINGS: Stable right internal jugular Port-A-Cath and surgical hardware from ACDF. Stable cardiomediastinal silhouette with mild cardiomegaly. No pneumothorax. No pleural effusion. No overt  pulmonary edema. No acute consolidative airspace disease. IMPRESSION: Stable mild cardiomegaly without overt pulmonary edema. No active pulmonary disease. Electronically Signed   By: Ilona Sorrel M.D.   On: 01/11/2021 11:50        Scheduled Meds:  allopurinol  100 mg Oral Daily   amLODipine  10 mg Oral Daily  vitamin C  500 mg Oral Daily   aspirin EC  81 mg Oral Daily   carvedilol  12.5 mg Oral BID WC   Chlorhexidine Gluconate Cloth  6 each Topical Daily   cholecalciferol  1,000 Units Oral Daily   ferrous sulfate  325 mg Oral Daily   gemfibrozil  600 mg Oral Daily   heparin  5,000 Units Subcutaneous Q8H   insulin aspart  0-15 Units Subcutaneous TID WC   ipratropium  2 puff Inhalation Q6H   latanoprost  1 drop Both Eyes QHS   losartan  100 mg Oral Daily   mouth rinse  15 mL Mouth Rinse BID   multivitamin with minerals  1 tablet Oral Daily   pantoprazole  40 mg Oral BID AC   Ensure Max Protein  237 mL Oral BID BM   zinc sulfate  220 mg Oral Daily   Continuous Infusions:   LOS: 10 days    Time spent: 27 minutes    Sharen Hones, MD [facility]   To contact the attending provider between 7A-7P or the covering provider during after hours 7P-7A, please log into the web site [email or web address] and access using universal  password for that web site. If you do not have the password, please call the hospital operator.  01/12/2021, 12:18 PM

## 2021-01-12 NOTE — Progress Notes (Signed)
PHARMACIST - PHYSICIAN COMMUNICATION  CONCERNING:  Enoxaparin (Lovenox) for DVT Prophylaxis    RECOMMENDATION: Patient was prescribed enoxaparin 40mg  q24 hours for VTE prophylaxis.   Filed Weights   01/02/21 1451 01/03/21 1957 01/03/21 1957  Weight: 110.3 kg (243 lb 3.2 oz) 109.3 kg (240 lb 15.4 oz) 109.3 kg (240 lb 15.4 oz)    Body mass index is 41.36 kg/m.  Estimated Creatinine Clearance: 45.5 mL/min (A) (by C-G formula based on SCr of 1.15 mg/dL (H)).   Based on [place] patient is candidate for enoxaparin 0.5mg /kg TBW SQ every 24 hours based on BMI being >30.  DESCRIPTION: Pharmacy has adjusted enoxaparin dose per [facility] policy.  Patient is now receiving enoxaparin 55 mg every 24 hours   Benita Gutter 01/12/2021 12:22 PM

## 2021-01-13 DIAGNOSIS — R509 Fever, unspecified: Secondary | ICD-10-CM | POA: Diagnosis not present

## 2021-01-13 DIAGNOSIS — R4182 Altered mental status, unspecified: Secondary | ICD-10-CM | POA: Diagnosis not present

## 2021-01-13 DIAGNOSIS — U071 COVID-19: Secondary | ICD-10-CM | POA: Diagnosis not present

## 2021-01-13 DIAGNOSIS — N1832 Chronic kidney disease, stage 3b: Secondary | ICD-10-CM

## 2021-01-13 DIAGNOSIS — D631 Anemia in chronic kidney disease: Secondary | ICD-10-CM

## 2021-01-13 LAB — GLUCOSE, CAPILLARY
Glucose-Capillary: 157 mg/dL — ABNORMAL HIGH (ref 70–99)
Glucose-Capillary: 228 mg/dL — ABNORMAL HIGH (ref 70–99)
Glucose-Capillary: 186 mg/dL — ABNORMAL HIGH (ref 70–99)
Glucose-Capillary: 192 mg/dL — ABNORMAL HIGH (ref 70–99)

## 2021-01-13 MED ORDER — HEPARIN SOD (PORK) LOCK FLUSH 100 UNIT/ML IV SOLN
500.0000 [IU] | Freq: Once | INTRAVENOUS | Status: AC
  Administered 2021-01-13: 19:00:00 500 [IU] via INTRAVENOUS
  Filled 2021-01-13: qty 5

## 2021-01-13 NOTE — Progress Notes (Signed)
PROGRESS NOTE    Sara Bright  KZL:[id number] DOB: 02-06-1941 DOA: 01/02/2021 PCP: Anthonette Legato, MD    Brief Narrative:  80 year old female with past medical history of diabetes, GERD, hyperlipidemia, essential hypertension, neuropathy, hypercalcemia, secondary hyperparathyroidism and chronic kidney disease presented with altered mental status.  She was found to have a COVID infection and completed 5 days of remdesivir.  She was found to have ESBL Klebsiella UTI.  Initially was on Rocephin but then antibiotics had to be switched over to meropenem and now on Zosyn.  Antibiotics completed. Patient currently pending transfer to nursing home, quarantine for COVID ends on 11/7.    Assessment & Plan:   Principal Problem:   AMS (altered mental status) Active Problems:   Anemia due to stage 3 chronic kidney disease (HCC)   CKD stage 3 due to type 2 diabetes mellitus (HCC)   Diabetes (HCC)   Essential hypertension   GERD (gastroesophageal reflux disease)   IDA (iron deficiency anemia)   COVID-19 virus infection   Acute respiratory failure with hypoxia (HCC)   Urinary tract infection due to ESBL Klebsiella   Primary hyperparathyroidism ([place])   CKD stage 4 due to type 2 diabetes mellitus ([place])   Secondary hyperparathyroidism (HCC)   Slurred speech   Anemia of chronic disease   Swelling  New fever. Patient had intermittent fever for the last 3 days, but patient does not have other symptoms.  UA has some white cells, but she has been treated for UTI and completed antibiotics.  Checks x-ray does not have any evidence of pneumonia. Blood culture came back with 1 set of Staphylococcus capitis.  Most likely contaminants. There is a possibility, fevers caused by complication from COVID. I will obtain ID consult to determine the source of fever.  ESBL Klebsiella acute cystitis with hematuria. Acute metabolic encephalopathy secondary to UTI. COVID infection. Condition had  improved.   Acute kidney injury on chronic kidney disease stage IIIa. Secondary hyperparathyroidism with hypercalcemia. Anemia of chronic disease. Stable.  Dysphagia. Outpatient work-up.  Continue increased dose of Protonix.   Type 2 diabetes with chronic kidney disease. Continue current treatment.  Morbid obesity.   DVT prophylaxis: Lovenox Code Status: full Family Communication:  Disposition Plan:      Status is: Inpatient   Remains inpatient appropriate because: Unsafe discharge.       I/O last 3 completed shifts: In: -  Out: 1900 [Urine:1900] Total I/O In: -  Out: 1200 [Urine:1200]      Subjective: Patient has another episode of fever earlier this morning with temperature 100.5, patient was asymptomatic. She denies any short of breath or cough, she does not have dysphagia.  Her heartburn is much better. Denies any short of breath or cough. No dysuria or hematuria.  Objective: Vitals:   01/13/21 0105 01/13/21 0721 01/13/21 0852 01/13/21 1134  BP: (!) 165/55 (!) 161/54 (!) 169/52 (!) 157/52  Pulse: 79 82 78 70  Resp: 18 20 18 20   Temp: 99.7 F (37.6 C) (!) 100.5 F (38.1 C) 98.8 F (37.1 C) 98.2 F (36.8 C)  TempSrc: Oral Oral Oral Oral  SpO2: 98% 96% 100% 100%  Weight:      Height:        Intake/Output Summary (Last 24 hours) at 01/13/2021 1405 Last data filed at 01/13/2021 0802 Gross per 24 hour  Intake --  Output 1200 ml  Net -1200 ml   Filed Weights   01/02/21 1451 01/03/21 1957 01/03/21 1957  Weight: 110.3  kg 109.3 kg 109.3 kg    Examination:  General exam: Appears calm and comfortable  Respiratory system: Clear to auscultation. Respiratory effort normal. Cardiovascular system: S1 & S2 heard, RRR. No JVD, murmurs, rubs, gallops or clicks. No pedal edema. Gastrointestinal system: Abdomen is nondistended, soft and nontender. No organomegaly or masses felt. Normal bowel sounds heard. Central nervous system: Alert and oriented x3. No  focal neurological deficits. Extremities: Symmetric 5 x 5 power. Skin: No rashes, lesions or ulcers Psychiatry: Judgement and insight appear normal. Mood & affect appropriate.     Data Reviewed: I have personally reviewed following labs and imaging studies  CBC: Recent Labs  Lab 01/08/21 0615 01/09/21 0601 01/11/21 0815  WBC 5.4 5.4 7.3  NEUTROABS  --  2.3 4.5  HGB 7.4* 7.4* 8.4*  HCT 23.6* 24.0* 27.6*  MCV 103.1* 104.3* 104.2*  PLT 174 185 536   Basic Metabolic Panel: Recent Labs  Lab 01/07/21 0633 01/08/21 0615 01/09/21 0601 01/11/21 0815  NA 141 139 137 135  K 3.8 3.6 3.7 4.4  CL 109 106 104 101  CO2 25 26 27 27   GLUCOSE 187* 140* 122* 106*  BUN 29* 21 17 15   CREATININE 1.39* 1.39* 1.30* 1.15*  CALCIUM 10.2 10.2 10.3 11.0*  MG  --   --  1.9  --    GFR: Estimated Creatinine Clearance: 45.5 mL/min (A) (by C-G formula based on SCr of 1.15 mg/dL (H)). Liver Function Tests: Recent Labs  Lab 01/07/21 0633  AST 25  ALT 14  ALKPHOS 48  BILITOT 0.8  PROT 5.7*  ALBUMIN 2.4*   No results for input(s): LIPASE, AMYLASE in the last 168 hours. No results for input(s): AMMONIA in the last 168 hours. Coagulation Profile: No results for input(s): INR, PROTIME in the last 168 hours. Cardiac Enzymes: No results for input(s): CKTOTAL, CKMB, CKMBINDEX, TROPONINI in the last 168 hours. BNP (last 3 results) No results for input(s): PROBNP in the last 8760 hours. HbA1C: No results for input(s): HGBA1C in the last 72 hours. CBG: Recent Labs  Lab 01/12/21 1643 01/12/21 2052 01/12/21 2128 01/13/21 0854 01/13/21 1136  GLUCAP 112* 190* 163* 157* 228*   Lipid Profile: No results for input(s): CHOL, HDL, LDLCALC, TRIG, CHOLHDL, LDLDIRECT in the last 72 hours. Thyroid Function Tests: No results for input(s): TSH, T4TOTAL, FREET4, T3FREE, THYROIDAB in the last 72 hours. Anemia Panel: No results for input(s): VITAMINB12, FOLATE, FERRITIN, TIBC, IRON, RETICCTPCT in the  last 72 hours. Sepsis Labs: Recent Labs  Lab 01/11/21 0815  PROCALCITON <0.10    Recent Results (from the past 240 hour(s))  CULTURE, BLOOD (ROUTINE X 2) w Reflex to ID Panel     Status: None (Preliminary result)   Collection Time: 01/11/21  8:31 AM   Specimen: BLOOD  Result Value Ref Range Status   Specimen Description BLOOD RIGHT ARM  Final   Special Requests   Final    BOTTLES DRAWN AEROBIC AND ANAEROBIC Blood Culture adequate volume   Culture   Final    NO GROWTH 2 DAYS Performed at [facility], [address] [place], [place] [address]    Report Status PENDING  Incomplete  CULTURE, BLOOD (ROUTINE X 2) w Reflex to ID Panel     Status: Abnormal (Preliminary result)   Collection Time: 01/11/21  8:31 AM   Specimen: BLOOD  Result Value Ref Range Status   Specimen Description   Final    BLOOD RIGHT HAND Performed at [facility]  [facility], [address], [place], [place] [address]    Special Requests   Final    BOTTLES DRAWN AEROBIC AND ANAEROBIC Blood Culture adequate volume Performed at [facility], [address] [place], [place] [address]    Culture  Setup Time   Final    Organism ID to follow ANAEROBIC BOTTLE ONLY GRAM POSITIVE COCCI CRITICAL RESULT CALLED TO, READ BACK BY AND VERIFIED WITH: NATHAN BELUE AT 3151 01/12/21.PMF Performed at [facility], [address] [place], [place] [address]    Culture (A)  Final    STAPHYLOCOCCUS CAPITIS THE SIGNIFICANCE OF ISOLATING THIS ORGANISM FROM A SINGLE SET OF BLOOD CULTURES WHEN MULTIPLE SETS ARE DRAWN IS UNCERTAIN. PLEASE NOTIFY THE MICROBIOLOGY DEPARTMENT WITHIN ONE WEEK IF SPECIATION AND SENSITIVITIES ARE REQUIRED. Performed at [facility], [place] [address] [place], [place] [address]    Report Status PENDING  Incomplete  Blood Culture ID Panel (Reflexed)     Status: Abnormal   Collection Time: 01/11/21  8:31 AM  Result Value Ref Range Status   Enterococcus faecalis NOT  DETECTED NOT DETECTED Final   Enterococcus Faecium NOT DETECTED NOT DETECTED Final   Listeria monocytogenes NOT DETECTED NOT DETECTED Final   Staphylococcus species DETECTED (A) NOT DETECTED Final    Comment: CRITICAL RESULT CALLED TO, READ BACK BY AND VERIFIED WITH: NATHAN BELUE AT 6269 01/12/21.PMF    Staphylococcus aureus (BCID) NOT DETECTED NOT DETECTED Final   Staphylococcus epidermidis NOT DETECTED NOT DETECTED Final   Staphylococcus lugdunensis NOT DETECTED NOT DETECTED Final   Streptococcus species NOT DETECTED NOT DETECTED Final   Streptococcus agalactiae NOT DETECTED NOT DETECTED Final   Streptococcus pneumoniae NOT DETECTED NOT DETECTED Final   Streptococcus pyogenes NOT DETECTED NOT DETECTED Final   A.calcoaceticus-baumannii NOT DETECTED NOT DETECTED Final   Bacteroides fragilis NOT DETECTED NOT DETECTED Final   Enterobacterales NOT DETECTED NOT DETECTED Final   Enterobacter cloacae complex NOT DETECTED NOT DETECTED Final   Escherichia coli NOT DETECTED NOT DETECTED Final   Klebsiella aerogenes NOT DETECTED NOT DETECTED Final   Klebsiella oxytoca NOT DETECTED NOT DETECTED Final   Klebsiella pneumoniae NOT DETECTED NOT DETECTED Final   Proteus species NOT DETECTED NOT DETECTED Final   Salmonella species NOT DETECTED NOT DETECTED Final   Serratia marcescens NOT DETECTED NOT DETECTED Final   Haemophilus influenzae NOT DETECTED NOT DETECTED Final   Neisseria meningitidis NOT DETECTED NOT DETECTED Final   Pseudomonas aeruginosa NOT DETECTED NOT DETECTED Final   Stenotrophomonas maltophilia NOT DETECTED NOT DETECTED Final   Candida albicans NOT DETECTED NOT DETECTED Final   Candida auris NOT DETECTED NOT DETECTED Final   Candida glabrata NOT DETECTED NOT DETECTED Final   Candida krusei NOT DETECTED NOT DETECTED Final   Candida parapsilosis NOT DETECTED NOT DETECTED Final   Candida tropicalis NOT DETECTED NOT DETECTED Final   Cryptococcus neoformans/gattii NOT DETECTED NOT  DETECTED Final    Comment: Performed at [facility], [address], [place], [place] [address]         Radiology Studies: No results found.      Scheduled Meds:  allopurinol  100 mg Oral Daily   amLODipine  10 mg Oral Daily   vitamin C  500 mg Oral Daily   aspirin EC  81 mg Oral Daily   carvedilol  12.5 mg Oral BID WC   Chlorhexidine Gluconate Cloth  6 each Topical Daily   cholecalciferol  1,000 Units Oral Daily  enoxaparin (LOVENOX) injection  0.5 mg/kg Subcutaneous Q24H   ferrous sulfate  325 mg Oral Daily   gemfibrozil  600 mg Oral Daily   insulin aspart  0-15 Units Subcutaneous TID WC   ipratropium  2 puff Inhalation Q6H   latanoprost  1 drop Both Eyes QHS   losartan  100 mg Oral Daily   mouth rinse  15 mL Mouth Rinse BID   multivitamin with minerals  1 tablet Oral Daily   pantoprazole  40 mg Oral BID AC   Ensure Max Protein  237 mL Oral BID BM   zinc sulfate  220 mg Oral Daily   Continuous Infusions:   LOS: 11 days    Time spent: 27 minutes    Sharen Hones, MD [facility]   To contact the attending provider between 7A-7P or the covering provider during after hours 7P-7A, please log into the web site [email or web address] and access using universal [place] password for that web site. If you do not have the password, please call the hospital operator.  01/13/2021, 2:05 PM

## 2021-01-13 NOTE — Progress Notes (Signed)
Pt refused to deaccess the port as MD order, MD notified

## 2021-01-13 NOTE — Progress Notes (Signed)
    Durable Medical Equipment  (From admission, onward)           Start     Ordered   01/13/21 1143  For home use only DME Hospital bed  Once       Question Answer Comment  Length of Need 12 Months   Patient has (list medical condition): COVID, Patient is at high risk for aspiration.  DME order   The above medical condition requires: Patient requires the ability to reposition frequently   Head must be elevated greater than: 30 degrees   Bed type Semi-electric      01/13/21 1142

## 2021-01-13 NOTE — Consult Note (Signed)
NAME: Sara Bright  DOB: 03-26-1940  MRN: [id number]  Date/Time: 01/13/2021 12:44 PM  REQUESTING PROVIDER: Dr.[email or web address] Subjective:  REASON FOR CONSULT: Fever ?pt is a poor historian- sister at bed side says she is not her usual self- says she is lethargic Sara Bright is a 80 y.o. female with a history of DM, CKD, anemia , HTN, OSA presented from SNF with altered mental status on 01/02/21 [facility] ,for hyperglycemia and lthargy. She was getting ozempic but the SNF was out of the medicine and she had to receive humalog In the ED BP 120/54, TEMP 98, HR 62 and sats 100% Labs revealed Hb 8.1, cr 2.83, WBC 8.9 She tested positive for COVID- CXR no infiltate. CT head no acute findings. MRI brain done on 10/27 reported as  Absent flow void within the left ICA to the terminus, likely occluded. Finding is presumably chronic in nature given the lack of acute ischemic changes.  She received remdesivir No blood cultures done on admission She had no urinary symptoms and a urine culture sent on admission showed ESBL kleb She received ceftriaxone on 10/27 & 10/28 and then meropenem 10/29-10/30/10/31 and zosyn -10/31, 11/1 and 11/2 I am asked to see her as she has been running low grade fever 2 sets of blood culture sent on 11/5 had 1/4 staph capitis thought to be a contaminant  Prior to this admission patient had presented to the ED on 12/09/20 with rt foot swelling and pain and she thought it was gout. Xray of foot showed non displaced metatarsal fracture 2,3,4,5. She was placed in aCAM walker and asked to follow up with ortho as OP   Past Medical History:  Diagnosis Date   Adenoma of colon    Adenomatous colon polyp    Anemia    IDA   Cataract    Cervical stenosis of spinal canal    Chronic kidney disease    stage 3   Diabetes mellitus without complication (HCC)    GERD (gastroesophageal reflux disease)    Glaucoma    Heme positive stool    History of UTI    Hx of gallstones     Hyperkalemia    Hyperlipidemia    Hyperlipidemia    Hypertension    Lumbar stenosis    Lymphedema    Neuromuscular disorder (HCC)    Neuropathy associated with endocrine disorder (HCC)    Obesity    OSA on CPAP    on CPAP   Osteoarthritis    Osteoarthritis    Port-A-Cath in place    Retinopathy due to secondary diabetes ([place])    Secondary hyperparathyroidism of renal origin [facility])     Past Surgical History:  Procedure Laterality Date   ACDF X2     BACK SURGERY     Cataract extraction right     catarect extraction left     CHOLECYSTECTOMY     COLONOSCOPY WITH PROPOFOL N/A 12/02/2016   Procedure: COLONOSCOPY WITH PROPOFOL;  Surgeon: Manya Silvas, MD;  Location: [facility] ENDOSCOPY;  Service: Endoscopy;  Laterality: N/A;   COLONOSCOPY WITH PROPOFOL N/A 11/10/2017   Procedure: COLONOSCOPY WITH PROPOFOL;  Surgeon: Toledo, Benay Pike, MD;  Location: [facility] ENDOSCOPY;  Service: Gastroenterology;  Laterality: N/A;   colonoscopy with removal lesions by snare     Endoscoic carpal tunnel release     ESOPHAGOGASTRODUODENOSCOPY N/A 11/10/2017   Procedure: ESOPHAGOGASTRODUODENOSCOPY (EGD);  Surgeon: Toledo, Benay Pike, MD;  Location: [facility] ENDOSCOPY;  Service: Gastroenterology;  Laterality:  N/A;   ESOPHAGOGASTRODUODENOSCOPY (EGD) WITH PROPOFOL N/A 12/02/2016   Procedure: ESOPHAGOGASTRODUODENOSCOPY (EGD) WITH PROPOFOL;  Surgeon: Manya Silvas, MD;  Location: [facility] ENDOSCOPY;  Service: Endoscopy;  Laterality: N/A;   IR IMAGING GUIDED PORT INSERTION  10/01/2017   Laminectomy posterior lumbar facetectomy and formaninotomy w/Decomp     Laminectony posterior cervicle decomp w/Facectomy and foraminotomy     VEIN LIGATION AND STRIPPING Left     Social History   Socioeconomic History   Marital status: Widowed    Spouse name: Not on file   Number of children: Not on file   Years of education: Not on file   Highest education level: Not on file  Occupational History   Not on file  Tobacco Use   Smoking  status: Never   Smokeless tobacco: Never  Vaping Use   Vaping Use: Never used  Substance and Sexual Activity   Alcohol use: No   Drug use: No   Sexual activity: Not on file  Other Topics Concern   Not on file  Social History Narrative   Not on file   Social Determinants of Health   Financial Resource Strain: Not on file  Food Insecurity: Not on file  Transportation Needs: Not on file  Physical Activity: Not on file  Stress: Not on file  Social Connections: Not on file  Intimate Partner Violence: Not on file    Family History  Problem Relation Age of Onset   Coronary artery disease Mother    Diabetes type II Mother    Hypertension Mother    Tuberculosis Father    Stroke Father    Diabetes type II Sister    Migraines Sister    Alcohol abuse Brother    Coronary artery disease Brother    Diabetes type II Brother    Kidney cancer Brother    Kidney disease Brother    Heart attack Brother    Hypertension Brother    Allergies  Allergen Reactions   Aspirin     Other reaction(s): Other (see comments)   Hydrocodone-Acetaminophen Other (See Comments)    Sedation and GI upset, pt is not allergic to acetaminophen   Other Other (See Comments)   Statins Other (See Comments)   Tramadol-Acetaminophen Nausea And Vomiting    GI upset, pt is not allergic to acetaminophen   I? Current Facility-Administered Medications  Medication Dose Route Frequency Provider Last Rate Last Admin   acetaminophen (TYLENOL) tablet 650 mg  650 mg Oral Q6H PRN Sharen Hones, MD   650 mg at 01/08/21 2247   albuterol (VENTOLIN HFA) 108 (90 Base) MCG/ACT inhaler 2 puff  2 puff Inhalation Q6H PRN Wieting, Richard, MD       allopurinol (ZYLOPRIM) tablet 100 mg  100 mg Oral Daily Leslye Peer, Richard, MD   100 mg at 01/13/21 1032   amLODipine (NORVASC) tablet 10 mg  10 mg Oral Daily Loletha Grayer, MD   10 mg at 01/13/21 1029   ascorbic acid (VITAMIN C) tablet 500 mg  500 mg Oral Daily Florina Ou V, MD   500  mg at 01/13/21 1029   aspirin EC tablet 81 mg  81 mg Oral Daily Loletha Grayer, MD   81 mg at 01/13/21 1029   carvedilol (COREG) tablet 12.5 mg  12.5 mg Oral BID WC Loletha Grayer, MD   12.5 mg at 01/13/21 1029   Chlorhexidine Gluconate Cloth 2 % PADS 6 each  6 each Topical Daily Sharion Settler, NP   6 each  at 01/13/21 1032   cholecalciferol (VITAMIN D3) tablet 1,000 Units  1,000 Units Oral Daily Loletha Grayer, MD   1,000 Units at 01/13/21 1029   enoxaparin (LOVENOX) injection 55 mg  0.5 mg/kg Subcutaneous Q24H Sharen Hones, MD   55 mg at 01/12/21 2100   ferrous sulfate tablet 325 mg  325 mg Oral Daily Loletha Grayer, MD   325 mg at 01/13/21 1029   gemfibrozil (LOPID) tablet 600 mg  600 mg Oral Daily Loletha Grayer, MD   600 mg at 01/13/21 1030   guaiFENesin-dextromethorphan (ROBITUSSIN DM) 100-10 MG/5ML syrup 10 mL  10 mL Oral Q4H PRN Para Skeans, MD       insulin aspart (novoLOG) injection 0-15 Units  0-15 Units Subcutaneous TID WC Para Skeans, MD   3 Units at 01/13/21 1028   ipratropium (ATROVENT HFA) inhaler 2 puff  2 puff Inhalation Q6H Para Skeans, MD   2 puff at 01/13/21 1032   latanoprost (XALATAN) 0.005 % ophthalmic solution 1 drop  1 drop Both Eyes QHS Loletha Grayer, MD   1 drop at 01/12/21 2101   losartan (COZAAR) tablet 100 mg  100 mg Oral Daily Sharen Hones, MD   100 mg at 01/13/21 1029   MEDLINE mouth rinse  15 mL Mouth Rinse BID Loletha Grayer, MD   15 mL at 01/13/21 1033   multivitamin with minerals tablet 1 tablet  1 tablet Oral Daily Loletha Grayer, MD   1 tablet at 01/13/21 1029   ondansetron (ZOFRAN) injection 4 mg  4 mg Intravenous Q6H PRN Loletha Grayer, MD   4 mg at 01/10/21 1159   ondansetron (ZOFRAN) tablet 4 mg  4 mg Oral Q8H PRN Loletha Grayer, MD   4 mg at 01/03/21 1411   pantoprazole (PROTONIX) EC tablet 40 mg  40 mg Oral BID AC Sharen Hones, MD   40 mg at 01/13/21 1029   protein supplement (ENSURE MAX) liquid  237 mL Oral BID BM Murlean Iba, MD   237 mL at 01/13/21 1033   sodium chloride flush (NS) 0.9 % injection 10-40 mL  10-40 mL Intracatheter PRN Sharen Hones, MD       zinc sulfate capsule 220 mg  220 mg Oral Daily Florina Ou V, MD   220 mg at 01/13/21 1029     Abtx:  Anti-infectives (From admission, onward)    Start     Dose/Rate Route Frequency Ordered Stop   01/06/21 2200  piperacillin-tazobactam (ZOSYN) IVPB 3.375 g        3.375 g 12.5 mL/hr over 240 Minutes Intravenous Every 8 hours 01/06/21 1520 01/09/21 0251   01/06/21 1000  meropenem (MERREM) 1 g in sodium chloride 0.9 % 100 mL IVPB  Status:  Discontinued        1 g 200 mL/hr over 30 Minutes Intravenous Every 12 hours 01/06/21 0720 01/06/21 1520   01/04/21 1200  meropenem (MERREM) 500 mg in sodium chloride 0.9 % 100 mL IVPB  Status:  Discontinued        500 mg 200 mL/hr over 30 Minutes Intravenous Every 12 hours 01/04/21 1055 01/06/21 0720   01/03/21 1800  cefTRIAXone (ROCEPHIN) 1 g in sodium chloride 0.9 % 100 mL IVPB  Status:  Discontinued        1 g 200 mL/hr over 30 Minutes Intravenous Every 24 hours 01/03/21 1212 01/04/21 1055   01/03/21 1000  remdesivir 100 mg in sodium chloride 0.9 % 100 mL IVPB  See Hyperspace for full Linked Orders Report.   100 mg 200 mL/hr over 30 Minutes Intravenous Daily 01/02/21 2142 01/06/21 1027   01/02/21 2300  remdesivir 200 mg in sodium chloride 0.9% 250 mL IVPB       See Hyperspace for full Linked Orders Report.   200 mg 580 mL/hr over 30 Minutes Intravenous Once 01/02/21 2142 01/03/21 0027   01/02/21 1915  cefTRIAXone (ROCEPHIN) 1 g in sodium chloride 0.9 % 100 mL IVPB        1 g 200 mL/hr over 30 Minutes Intravenous  Once 01/02/21 1904 01/02/21 2029       REVIEW OF SYSTEMS:  Const: tired, lethargic, body ache Eyes: negative diplopia or visual changes, negative eye pain ENT: negative coryza, negative sore throat Resp: negative cough, hemoptysis, dyspnea Cards: negative for chest pain, palpitations,  lower extremity edema GU: negative for frequency, dysuria and hematuria GI: says she is not able to swallow Negative for abdominal pain, diarrhea, bleeding, constipation Skin: negative for rash and pruritus Heme: negative for easy bruising and gum/nose bleeding MS: generalized weakness, pain ankles and feet Neurolo:negative for headaches, dizziness, vertigo, some memory problems  Psych: negative for feelings of anxiety, depression  Endocrine:  diabetes Allergy/Immunology- as above Objective:  VITALS:  BP (!) 157/52 (BP Location: Left Arm)   Pulse 70   Temp 98.2 F (36.8 C) (Oral)   Resp 20   Ht 5\' 4"  (1.626 m)   Wt 109.3 kg   SpO2 100%   BMI 41.36 kg/m  PHYSICAL EXAM:  General: Awake, lethargic, cooperative, no distress, appears young for her age- oriented in pace, person, knows her DOB Head: Normocephalic, without obvious abnormality, atraumatic. Eyes: Conjunctivae clear, anicteric sclerae. Pupils are equal ENT Nares normal. No drainage or sinus tenderness. Lips, mucosa, . Nromal- tongue coated No obvious thrush Neck: Supple, symmetrical, no adenopathy, thyroid: non tender no carotid bruit and no JVD. Back: No CVA tenderness. Lungs: Clear to auscultation bilaterally. No Wheezing or Rhonchi. No rales. Heart: Regular rate and rhythm, no murmur, rub or gallop. Abdomen: Soft, non-tender,not distended. Bowel sounds normal. No masses Extremities: edema legs and feet rt > left No erythema Skin: No rashes or lesions. Or bruising Lymph: Cervical, supraclavicular normal. Neurologic: Grossly non-focal Pertinent Labs Lab Results CBC    Component Value Date/Time   WBC 7.3 01/11/2021 0815   RBC 2.65 (L) 01/11/2021 0815   HGB 8.4 (L) 01/11/2021 0815   HGB 10.8 (L) 03/20/2014 1153   HCT 27.6 (L) 01/11/2021 0815   HCT 31.6 (L) 01/23/2014 1007   PLT 266 01/11/2021 0815   PLT 193 01/23/2014 1007   MCV 104.2 (H) 01/11/2021 0815   MCV 94 01/23/2014 1007   MCH 31.7 01/11/2021 0815    MCHC 30.4 01/11/2021 0815   RDW 15.3 01/11/2021 0815   RDW 15.1 (H) 01/23/2014 1007   LYMPHSABS 1.5 01/11/2021 0815   LYMPHSABS 1.5 01/23/2014 1007   MONOABS 0.9 01/11/2021 0815   MONOABS 0.5 01/23/2014 1007   EOSABS 0.2 01/11/2021 0815   EOSABS 0.2 01/23/2014 1007   BASOSABS 0.1 01/11/2021 0815   BASOSABS 0.1 01/23/2014 1007    CMP Latest Ref Rng & Units 01/11/2021 01/09/2021 01/08/2021  Glucose 70 - 99 mg/dL 106(H) 122(H) 140(H)  BUN 8 - 23 mg/dL 15 17 21   Creatinine 0.44 - 1.00 mg/dL 1.15(H) 1.30(H) 1.39(H)  Sodium 135 - 145 mmol/L 135 137 139  Potassium 3.5 - 5.1 mmol/L 4.4 3.7 3.6  Chloride 98 - 111  mmol/L 101 104 106  CO2 22 - 32 mmol/L 27 27 26   Calcium 8.9 - 10.3 mg/dL 11.0(H) 10.3 10.2  Total Protein 6.5 - 8.1 g/dL - - -  Total Bilirubin 0.3 - 1.2 mg/dL - - -  Alkaline Phos 38 - 126 U/L - - -  AST 15 - 41 U/L - - -  ALT 0 - 44 U/L - - -      Microbiology: Recent Results (from the past 240 hour(s))  CULTURE, BLOOD (ROUTINE X 2) w Reflex to ID Panel     Status: None (Preliminary result)   Collection Time: 01/11/21  8:31 AM   Specimen: BLOOD  Result Value Ref Range Status   Specimen Description BLOOD RIGHT ARM  Final   Special Requests   Final    BOTTLES DRAWN AEROBIC AND ANAEROBIC Blood Culture adequate volume   Culture   Final    NO GROWTH 2 DAYS Performed at [facility], [address] [place], [place] [address]    Report Status PENDING  Incomplete  CULTURE, BLOOD (ROUTINE X 2) w Reflex to ID Panel     Status: Abnormal (Preliminary result)   Collection Time: 01/11/21  8:31 AM   Specimen: BLOOD  Result Value Ref Range Status   Specimen Description   Final    BLOOD RIGHT HAND Performed at [facility], [address] [place], [place] [address]    Special Requests   Final    BOTTLES DRAWN AEROBIC AND ANAEROBIC Blood Culture adequate volume Performed at [facility], [address] [place], [place] [address]    Culture   Setup Time   Final    Organism ID to follow [place] TO, READ BACK BY AND VERIFIED WITH: NATHAN BELUE AT 3546 01/12/21.PMF Performed at [facility], [address] [place], [place] [address]    Culture (A)  Final    STAPHYLOCOCCUS CAPITIS THE SIGNIFICANCE OF ISOLATING THIS ORGANISM FROM A SINGLE SET OF BLOOD CULTURES WHEN MULTIPLE SETS ARE DRAWN IS UNCERTAIN. PLEASE NOTIFY THE MICROBIOLOGY DEPARTMENT WITHIN ONE WEEK IF SPECIATION AND SENSITIVITIES ARE REQUIRED. Performed at [facility], [place] [address], [place],  [address]    Report Status PENDING  Incomplete  Blood Culture ID Panel (Reflexed)     Status: Abnormal   Collection Time: 01/11/21  8:31 AM  Result Value Ref Range Status   Enterococcus faecalis NOT DETECTED NOT DETECTED Final   Enterococcus Faecium NOT DETECTED NOT DETECTED Final   Listeria monocytogenes NOT DETECTED NOT DETECTED Final   Staphylococcus species DETECTED (A) NOT DETECTED Final    Comment: CRITICAL RESULT CALLED TO, READ BACK BY AND VERIFIED WITH: NATHAN BELUE AT 0174 01/12/21.PMF    Staphylococcus aureus (BCID) NOT DETECTED NOT DETECTED Final   Staphylococcus epidermidis NOT DETECTED NOT DETECTED Final   Staphylococcus lugdunensis NOT DETECTED NOT DETECTED Final   Streptococcus species NOT DETECTED NOT DETECTED Final   Streptococcus agalactiae NOT DETECTED NOT DETECTED Final   Streptococcus pneumoniae NOT DETECTED NOT DETECTED Final   Streptococcus pyogenes NOT DETECTED NOT DETECTED Final   A.calcoaceticus-baumannii NOT DETECTED NOT DETECTED Final   Bacteroides fragilis NOT DETECTED NOT DETECTED Final   Enterobacterales NOT DETECTED NOT DETECTED Final   Enterobacter cloacae complex NOT DETECTED NOT DETECTED Final   Escherichia coli NOT DETECTED NOT DETECTED Final   Klebsiella aerogenes NOT DETECTED NOT DETECTED Final   Klebsiella oxytoca NOT DETECTED NOT DETECTED  Final    Klebsiella pneumoniae NOT DETECTED NOT DETECTED Final   Proteus species NOT DETECTED NOT DETECTED Final   Salmonella species NOT DETECTED NOT DETECTED Final   Serratia marcescens NOT DETECTED NOT DETECTED Final   Haemophilus influenzae NOT DETECTED NOT DETECTED Final   Neisseria meningitidis NOT DETECTED NOT DETECTED Final   Pseudomonas aeruginosa NOT DETECTED NOT DETECTED Final   Stenotrophomonas maltophilia NOT DETECTED NOT DETECTED Final   Candida albicans NOT DETECTED NOT DETECTED Final   Candida auris NOT DETECTED NOT DETECTED Final   Candida glabrata NOT DETECTED NOT DETECTED Final   Candida krusei NOT DETECTED NOT DETECTED Final   Candida parapsilosis NOT DETECTED NOT DETECTED Final   Candida tropicalis NOT DETECTED NOT DETECTED Final   Cryptococcus neoformans/gattii NOT DETECTED NOT DETECTED Final    Comment: Performed at [facility], [address] [place], [place] [address]    IMAGING RESULTS: CXR 01/11/21  port seen- no infiltrate I have personally reviewed the films ? Impression/Recommendation ? Pt presented with encephalopathy- thought to be due to hyperglycemia  COVID positive - asymptomatic- ct value 42 indicative of a non replicative virus, not acute  FUO- low grade < 100.8 ? Gout ? Due to port- do not access port Will assess after 24 hrs  Staph capitus 1 of 4 bottle- could be contaminant but because of PORT need to keep an eye and repeat if needed  Anemia thought to be due to CKD- she says she was getting iron in the past thru the port- followed by heme as OP. On erythropoietin  AKI on CKD- improved with correction of hyperglycemia ? ESBL klebsiella in the urine  likely asymptomatic bacteriuria vS contaminantion- has been treatd with 5 days of appropriate antibiotic with no change in the fever  H/o fracture metatarsal rt foot 2-5  Gout on allopurinol- check uric acid level  OSA-   HTN on  amlodipine  ? ___________________________________________________ Discussed with patient, sister . Her nurse and requesting provider Note:  This document was prepared using Dragon voice recognition software and may include unintentional dictation errors.

## 2021-01-13 NOTE — Progress Notes (Signed)
Talk to the daughter and pt regarding de access the port , they allow to deaccess the port

## 2021-01-13 NOTE — Progress Notes (Signed)
Secure chat to Dr. Roosevelt Locks regarding PIV access.  Pt has a port that was deaccessed per MD order by primary RN.  Currently no IV meds; has one PRN IV med, which was last given 11/4.  MD aware of handoff to night shift IV Team RN whom is included in secure chat.  Will hold on IV start until deemed necessary; advised IV Team is available 24/7.

## 2021-01-14 ENCOUNTER — Inpatient Hospital Stay: Payer: Medicare HMO

## 2021-01-14 ENCOUNTER — Inpatient Hospital Stay: Payer: Self-pay

## 2021-01-14 LAB — BASIC METABOLIC PANEL
Anion gap: 9 (ref 5–15)
BUN: 16 mg/dL (ref 8–23)
CO2: 24 mmol/L (ref 22–32)
Calcium: 10.7 mg/dL — ABNORMAL HIGH (ref 8.9–10.3)
Chloride: 101 mmol/L (ref 98–111)
Creatinine, Ser: 1.3 mg/dL — ABNORMAL HIGH (ref 0.44–1.00)
GFR, Estimated: 41 mL/min — ABNORMAL LOW (ref >60)
Glucose, Bld: 158 mg/dL — ABNORMAL HIGH (ref 70–99)
Potassium: 4.4 mmol/L (ref 3.5–5.1)
Sodium: 134 mmol/L — ABNORMAL LOW (ref 135–145)

## 2021-01-14 LAB — CULTURE, BLOOD (ROUTINE X 2): Special Requests: ADEQUATE

## 2021-01-14 LAB — GLUCOSE, CAPILLARY
Glucose-Capillary: 157 mg/dL — ABNORMAL HIGH (ref 70–99)
Glucose-Capillary: 186 mg/dL — ABNORMAL HIGH (ref 70–99)
Glucose-Capillary: 228 mg/dL — ABNORMAL HIGH (ref 70–99)
Glucose-Capillary: 180 mg/dL — ABNORMAL HIGH (ref 70–99)

## 2021-01-14 LAB — TSH: TSH: 4.634 u[IU]/mL — ABNORMAL HIGH (ref 0.350–4.500)

## 2021-01-14 LAB — URIC ACID: Uric Acid, Serum: 5.1 mg/dL (ref 2.5–7.1)

## 2021-01-14 LAB — CORTISOL-AM, BLOOD: Cortisol - AM: 13.6 ug/dL (ref 6.7–22.6)

## 2021-01-14 MED ORDER — POLYETHYLENE GLYCOL 3350 17 G PO PACK
17.0000 g | PACK | Freq: Every day | ORAL | Status: DC
  Administered 2021-01-14 – 2021-01-15 (×2): 17 g via ORAL
  Filled 2021-01-14 (×2): qty 1

## 2021-01-14 MED ORDER — LEVOTHYROXINE SODIUM 50 MCG PO TABS
50.0000 ug | ORAL_TABLET | Freq: Every day | ORAL | Status: DC
  Administered 2021-01-14 – 2021-01-15 (×2): 50 ug via ORAL
  Filled 2021-01-14 (×2): qty 1

## 2021-01-14 NOTE — Progress Notes (Addendum)
PROGRESS NOTE    Sara Bright  TDD:[id number] DOB: 09-Oct-1940 DOA: 01/02/2021 PCP: Anthonette Legato, MD   Chief complaint.  Altered mental status. Brief Narrative:  80 year old female with past medical history of diabetes, GERD, hyperlipidemia, essential hypertension, neuropathy, hypercalcemia, secondary hyperparathyroidism and chronic kidney disease presented with altered mental status.  She was found to have a COVID infection and completed 5 days of remdesivir.  She was found to have ESBL Klebsiella UTI.  Initially was on Rocephin but then antibiotics had to be switched over to meropenem and now on Zosyn.  Antibiotics completed. Patient currently pending transfer to nursing home, quarantine for COVID ends on 11/7.  Patient has intermittent fever for the last 3 days.  ID did not think there is any infection.    Assessment & Plan:   Principal Problem:   AMS (altered mental status) Active Problems:   Anemia due to stage 3 chronic kidney disease (HCC)   CKD stage 3 due to type 2 diabetes mellitus (HCC)   Diabetes (HCC)   Essential hypertension   GERD (gastroesophageal reflux disease)   IDA (iron deficiency anemia)   COVID-19 virus infection   Acute respiratory failure with hypoxia (HCC)   Urinary tract infection due to ESBL Klebsiella   Primary hyperparathyroidism ([place])   CKD stage 4 due to type 2 diabetes mellitus ([place])   Secondary hyperparathyroidism (HCC)   Slurred speech   Anemia of chronic disease   Swelling  Intermittent fever.  Fever of unknown origin. Patient has intermittent fever for the last 3 days, blood culture was a performed, on a showed a contaminant.  ID has seen the patient, does not believe patient has any infection. We also de-accessed the IV port, to see if the fever can resolve afterwards.  Please do not re-access the port. On my exam, patient has left neck swelling.  But no tenderness or redness.  I will obtain CT scan of the chest and the neck. Patient  also has mild hypercalcemia, I will obtain PTH. Patient probably can be discharged after work-up is complete.  ESBL Klebsiella acute cystitis with hematuria. Acute metabolic encephalopathy secondary to UTI. COVID infection. Condition had improved.     Acute kidney injury on chronic kidney disease stage IIIa. Secondary hyperparathyroidism with hypercalcemia. Anemia of chronic disease. Renal function has improved.  We will recheck a PTH.  Dysphagia. Outpatient work-up.  Condition much improved after increased dose of Protonix.   Type 2 diabetes with chronic kidney disease. Continue current treatment.  Hypothyroidism. Start Synthroid.   Morbid obesity.     DVT prophylaxis: Lovenox Code Status: full Family Communication: Daughter updated on the phone. Disposition Plan:      Status is: Inpatient   Remains inpatient appropriate because: Work-up for source of fever.          I/O last 3 completed shifts: In: -  Out: 1900 [Urine:1900] No intake/output data recorded.       Subjective: Patient doing well today, good appetite, no dysphagia.  No nausea vomiting. No diarrhea  She had low-grade fever early this morning, she did not have any symptoms. Denies any shortness of breath or cough. No headache or dizziness.  Objective: Vitals:   01/13/21 2107 01/14/21 0035 01/14/21 0455 01/14/21 0814  BP: (!) 128/51 (!) 131/45 (!) 134/54 (!) 125/45  Pulse: 67 68 72 69  Resp: 16 17 18 16   Temp: 98.7 F (37.1 C) 100 F (37.8 C) 99.8 F (37.7 C) 98.3 F (36.8 C)  TempSrc: Oral Oral Oral Oral  SpO2: 100% 100% 100% 100%  Weight:      Height:        Intake/Output Summary (Last 24 hours) at 01/14/2021 1051 Last data filed at 01/14/2021 0545 Gross per 24 hour  Intake --  Output 700 ml  Net -700 ml   Filed Weights   01/02/21 1451 01/03/21 1957 01/03/21 1957  Weight: 110.3 kg 109.3 kg 109.3 kg    Examination:  General exam: Appears calm and comfortable   Respiratory system: Clear to auscultation. Respiratory effort normal. Cardiovascular system: S1 & S2 heard, RRR. No JVD, murmurs, rubs, gallops or clicks. No pedal edema. Gastrointestinal system: Abdomen is nondistended, soft and nontender. No organomegaly or masses felt. Normal bowel sounds heard. Central nervous system: Alert and oriented x3. No focal neurological deficits. Extremities: Symmetric 5 x 5 power. Skin: No rashes, lesions or ulcers Psychiatry: Mood & affect appropriate.     Data Reviewed: I have personally reviewed following labs and imaging studies  CBC: Recent Labs  Lab 01/08/21 0615 01/09/21 0601 01/11/21 0815  WBC 5.4 5.4 7.3  NEUTROABS  --  2.3 4.5  HGB 7.4* 7.4* 8.4*  HCT 23.6* 24.0* 27.6*  MCV 103.1* 104.3* 104.2*  PLT 174 185 509   Basic Metabolic Panel: Recent Labs  Lab 01/08/21 0615 01/09/21 0601 01/11/21 0815 01/14/21 0643  NA 139 137 135 134*  K 3.6 3.7 4.4 4.4  CL 106 104 101 101  CO2 26 27 27 24   GLUCOSE 140* 122* 106* 158*  BUN 21 17 15 16   CREATININE 1.39* 1.30* 1.15* 1.30*  CALCIUM 10.2 10.3 11.0* 10.7*  MG  --  1.9  --   --    GFR: Estimated Creatinine Clearance: 40.3 mL/min (A) (by C-G formula based on SCr of 1.3 mg/dL (H)). Liver Function Tests: No results for input(s): AST, ALT, ALKPHOS, BILITOT, PROT, ALBUMIN in the last 168 hours. No results for input(s): LIPASE, AMYLASE in the last 168 hours. No results for input(s): AMMONIA in the last 168 hours. Coagulation Profile: No results for input(s): INR, PROTIME in the last 168 hours. Cardiac Enzymes: No results for input(s): CKTOTAL, CKMB, CKMBINDEX, TROPONINI in the last 168 hours. BNP (last 3 results) No results for input(s): PROBNP in the last 8760 hours. HbA1C: No results for input(s): HGBA1C in the last 72 hours. CBG: Recent Labs  Lab 01/13/21 0854 01/13/21 1136 01/13/21 1700 01/13/21 2107 01/14/21 0816  GLUCAP 157* 228* 186* 192* 157*   Lipid Profile: No  results for input(s): CHOL, HDL, LDLCALC, TRIG, CHOLHDL, LDLDIRECT in the last 72 hours. Thyroid Function Tests: Recent Labs    01/14/21 0643  TSH 4.634*   Anemia Panel: No results for input(s): VITAMINB12, FOLATE, FERRITIN, TIBC, IRON, RETICCTPCT in the last 72 hours. Sepsis Labs: Recent Labs  Lab 01/11/21 0815  PROCALCITON <0.10    Recent Results (from the past 240 hour(s))  CULTURE, BLOOD (ROUTINE X 2) w Reflex to ID Panel     Status: None (Preliminary result)   Collection Time: 01/11/21  8:31 AM   Specimen: BLOOD  Result Value Ref Range Status   Specimen Description BLOOD RIGHT ARM  Final   Special Requests   Final    BOTTLES DRAWN AEROBIC AND ANAEROBIC Blood Culture adequate volume   Culture   Final    NO GROWTH 3 DAYS Performed at [facility], [address], [place], [place] [address]    Report Status PENDING  Incomplete  CULTURE,  BLOOD (ROUTINE X 2) w Reflex to ID Panel     Status: Abnormal   Collection Time: 01/11/21  8:31 AM   Specimen: BLOOD  Result Value Ref Range Status   Specimen Description   Final    BLOOD RIGHT HAND Performed at [facility], [address] [place], [place] [address]    Special Requests   Final    BOTTLES DRAWN AEROBIC AND ANAEROBIC Blood Culture adequate volume Performed at [facility], [address] [place], [place] [address]    Culture  Setup Time   Final    Organism ID to follow ANAEROBIC BOTTLE ONLY GRAM POSITIVE COCCI CRITICAL RESULT CALLED TO, READ BACK BY AND VERIFIED WITH: NATHAN BELUE AT 8295 01/12/21.PMF Performed at [facility], [address] [place], [place] [address]    Culture (A)  Final    STAPHYLOCOCCUS CAPITIS THE SIGNIFICANCE OF ISOLATING THIS ORGANISM FROM A SINGLE SET OF BLOOD CULTURES WHEN MULTIPLE SETS ARE DRAWN IS UNCERTAIN. PLEASE NOTIFY THE MICROBIOLOGY DEPARTMENT WITHIN ONE WEEK IF SPECIATION AND SENSITIVITIES ARE REQUIRED. Performed at [place], [place] [address], [place], [place] [address]    Report Status 01/14/2021 FINAL  Final  Blood Culture ID Panel (Reflexed)     Status: Abnormal   Collection Time: 01/11/21  8:31 AM  Result Value Ref Range Status   Enterococcus faecalis NOT DETECTED NOT DETECTED Final   Enterococcus Faecium NOT DETECTED NOT DETECTED Final   Listeria monocytogenes NOT DETECTED NOT DETECTED Final   Staphylococcus species DETECTED (A) NOT DETECTED Final    Comment: CRITICAL RESULT CALLED TO, READ BACK BY AND VERIFIED WITH: NATHAN BELUE AT 4696 01/12/21.PMF    Staphylococcus aureus (BCID) NOT DETECTED NOT DETECTED Final   Staphylococcus epidermidis NOT DETECTED NOT DETECTED Final   Staphylococcus lugdunensis NOT DETECTED NOT DETECTED Final   Streptococcus species NOT DETECTED NOT DETECTED Final   Streptococcus agalactiae NOT DETECTED NOT DETECTED Final   Streptococcus pneumoniae NOT DETECTED NOT DETECTED Final   Streptococcus pyogenes NOT DETECTED NOT DETECTED Final   A.calcoaceticus-baumannii NOT DETECTED NOT DETECTED Final   Bacteroides fragilis NOT DETECTED NOT DETECTED Final   Enterobacterales NOT DETECTED NOT DETECTED Final   Enterobacter cloacae complex NOT DETECTED NOT DETECTED Final   Escherichia coli NOT DETECTED NOT DETECTED Final   Klebsiella aerogenes NOT DETECTED NOT DETECTED Final   Klebsiella oxytoca NOT DETECTED NOT DETECTED Final   Klebsiella pneumoniae NOT DETECTED NOT DETECTED Final   Proteus species NOT DETECTED NOT DETECTED Final   Salmonella species NOT DETECTED NOT DETECTED Final   Serratia marcescens NOT DETECTED NOT DETECTED Final   Haemophilus influenzae NOT DETECTED NOT DETECTED Final   Neisseria meningitidis NOT DETECTED NOT DETECTED Final   Pseudomonas aeruginosa NOT DETECTED NOT DETECTED Final   Stenotrophomonas maltophilia NOT DETECTED NOT DETECTED Final   Candida albicans NOT DETECTED NOT DETECTED Final   Candida auris NOT DETECTED NOT DETECTED Final   Candida glabrata NOT  DETECTED NOT DETECTED Final   Candida krusei NOT DETECTED NOT DETECTED Final   Candida parapsilosis NOT DETECTED NOT DETECTED Final   Candida tropicalis NOT DETECTED NOT DETECTED Final   Cryptococcus neoformans/gattii NOT DETECTED NOT DETECTED Final    Comment: Performed at [facility], [address] [place], [place] [address]         Radiology Studies: No results found.      Scheduled Meds:  allopurinol  100 mg Oral Daily   amLODipine  10 mg Oral Daily   vitamin C  500 mg Oral Daily   aspirin EC  81 mg Oral Daily   carvedilol  12.5 mg Oral BID WC   Chlorhexidine Gluconate Cloth  6 each Topical Daily   cholecalciferol  1,000 Units Oral Daily   enoxaparin (LOVENOX) injection  0.5 mg/kg Subcutaneous Q24H   ferrous sulfate  325 mg Oral Daily   gemfibrozil  600 mg Oral Daily   insulin aspart  0-15 Units Subcutaneous TID WC   ipratropium  2 puff Inhalation Q6H   latanoprost  1 drop Both Eyes QHS   levothyroxine  50 mcg Oral Q0600   losartan  100 mg Oral Daily   mouth rinse  15 mL Mouth Rinse BID   multivitamin with minerals  1 tablet Oral Daily   pantoprazole  40 mg Oral BID AC   Ensure Max Protein  237 mL Oral BID BM   zinc sulfate  220 mg Oral Daily   Continuous Infusions:   LOS: 12 days    Time spent: 32 minutes    Sharen Hones, MD [facility]   To contact the attending provider between 7A-7P or the covering provider during after hours 7P-7A, please log into the web site [email or web address] and access using universal [place] password for that web site. If you do not have the password, please call the hospital operator.  01/14/2021, 10:51 AM

## 2021-01-14 NOTE — Progress Notes (Addendum)
Physical Therapy Treatment Patient Details Name: Sara Bright MRN: [id number] DOB: 26-Nov-1940 Today's Date: 01/14/2021   History of Present Illness MASSIEL STIPP is a 80 y.o. female with history of hypertension, CKD, diabetes, OSA on CPAP who comes in with concerns for hyperglycemia and lethargy.  H/o R foot fx's.    PT Comments    Pt alert, grimacing due to stomach pain 8/10, but agreeable to repositioning. Pt required MOD A for scooting to side of bed and then requested bed pan placement. Instructed pt in rolling to side but unable to roll without extensive cuing and physical placement of LLE for pushing, pt semi-reaching with LUE. Several rolling attempts for bed pan placement with pt requiring physical assist each time w/ lack of carryover. Pt left on bed pan, RN notified of pain and pt positioning. SNF remains primary discharge recommendation. Skilled PT intervention is indicated to address deficits in function, mobility, and to return to PLOF as able.    Recommendations for follow up therapy are one component of a multi-disciplinary discharge planning process, led by the attending physician.  Recommendations may be updated based on patient status, additional functional criteria and insurance authorization.  Follow Up Recommendations  Skilled nursing-short term rehab (<3 hours/day)     Assistance Recommended at Discharge Frequent or constant Supervision/Assistance  Equipment Recommendations  None recommended by PT; If pt to return home would need hospital bed, hoyer lift, WC.    Recommendations for Other Services       Precautions / Restrictions Precautions Precautions: Fall Restrictions Weight Bearing Restrictions: Yes RLE Weight Bearing: Weight bearing as tolerated Other Position/Activity Restrictions: has R LE protective boot in room (and shoe for L foot)     Mobility  Bed Mobility Overal bed mobility: Needs Assistance Bed Mobility: Rolling Rolling: Mod assist          General bed mobility comments: Pt requesting bed pan placement, heavy cues and MOD A for lateral scooting w/ pt unable to lift or slide buttock; rolling x 3 for bed pan placement requiring consistent cues for L rolling, pt does not initiate LLE knee flexion and L arm reaching without physical placement of extremities this session    Transfers                   General transfer comment: Deferred, pt requested time on bed pan    Ambulation/Gait                   Stairs             Wheelchair Mobility    Modified Rankin (Stroke Patients Only)       Balance                                            Cognition Arousal/Alertness: Awake/alert Behavior During Therapy: WFL for tasks assessed/performed Overall Cognitive Status: No family/caregiver present to determine baseline cognitive functioning                                 General Comments: slow to respond, follows commands inconsistently this session        Exercises      General Comments        Pertinent Vitals/Pain Pain Assessment: 0-10 Pain Score: 8  Pain Location: stomach  Pain Descriptors / Indicators: Sore;Aching;Moaning;Grimacing Pain Intervention(s): Repositioned;Other (comment) (RN notified)    Home Living                          Prior Function            PT Goals (current goals can now be found in the care plan section) Progress towards PT goals: Progressing toward goals    Frequency    Min 2X/week      PT Plan Current plan remains appropriate    Co-evaluation              AM-PAC PT "6 Clicks" Mobility   Outcome Measure  Help needed turning from your back to your side while in a flat bed without using bedrails?: A Lot Help needed moving from lying on your back to sitting on the side of a flat bed without using bedrails?: A Lot Help needed moving to and from a bed to a chair (including a wheelchair)?:  Total Help needed standing up from a chair using your arms (e.g., wheelchair or bedside chair)?: A Lot Help needed to walk in hospital room?: Total Help needed climbing 3-5 steps with a railing? : Total 6 Click Score: 9    End of Session Equipment Utilized During Treatment: Gait belt Activity Tolerance: Other (comment) (BM)   Nurse Communication: Mobility status;Other (comment) (pt left on bed pan) PT Visit Diagnosis: Unsteadiness on feet (R26.81);Other abnormalities of gait and mobility (R26.89);Muscle weakness (generalized) (M62.81);Difficulty in walking, not elsewhere classified (R26.2);Pain     Time: 9449-6759 PT Time Calculation (min) (ACUTE ONLY): 14 min  Charges:                        [employer], SPT

## 2021-01-15 ENCOUNTER — Encounter: Payer: Self-pay | Admitting: Internal Medicine

## 2021-01-15 LAB — GLUCOSE, CAPILLARY
Glucose-Capillary: 200 mg/dL — ABNORMAL HIGH (ref 70–99)
Glucose-Capillary: 224 mg/dL — ABNORMAL HIGH (ref 70–99)
Glucose-Capillary: 176 mg/dL — ABNORMAL HIGH (ref 70–99)

## 2021-01-15 LAB — PTH, INTACT AND CALCIUM
Calcium, Total (PTH): 10.7 mg/dL — ABNORMAL HIGH (ref 8.7–10.3)
PTH: 56 pg/mL (ref 15–65)

## 2021-01-15 MED ORDER — FERROUS SULFATE 220 (44 FE) MG/5ML PO ELIX
325.0000 mg | ORAL_SOLUTION | Freq: Every day | ORAL | Status: DC
  Filled 2021-01-15 (×2): qty 7.4

## 2021-01-15 NOTE — Care Management Important Message (Signed)
Important Message  Patient Details  Name: Sara Bright MRN: [id number] Date of Birth: [birth year]   Medicare Important Message Given:  Yes  I reviewed the Important Message from Medicare with the patient's daughter, Sara Bright ([phone]) again today. She is in agreement with the discharge.  I asked if she would like me to send her another copy but she said it was not necessary as she had the copy from 11/4. I thanked her for time.    Juliann Pulse A Virgle Arth 01/15/2021, 12:40 PM

## 2021-01-15 NOTE — Discharge Summary (Signed)
Physician Discharge Summary  Sara Bright QQP:[id number] DOB: 1940-11-07 DOA: 01/02/2021  PCP: Anthonette Legato, MD  Admit date: 01/02/2021 Discharge date: 01/15/2021  Discharge disposition: Home with home health therapy   Recommendations for Outpatient Follow-Up:   Follow-up with PCP in 1 week   Discharge Diagnosis:   Principal Problem:   AMS (altered mental status) Active Problems:   Anemia due to stage 3 chronic kidney disease ([place])   CKD stage 3 due to type 2 diabetes mellitus ([place])   Diabetes ([place])   Essential hypertension   GERD (gastroesophageal reflux disease)   IDA (iron deficiency anemia)   COVID-19 virus infection   Acute respiratory failure with hypoxia ([facility])   Urinary tract infection due to ESBL Klebsiella   Primary hyperparathyroidism ([place])   CKD stage 4 due to type 2 diabetes mellitus ([place])   Secondary hyperparathyroidism ([place])   Slurred speech   Anemia of chronic disease   Swelling    Discharge Condition: Stable.  Diet recommendation:  Diet Order             Diet - low sodium heart healthy           Diet Carb Modified           DIET DYS 2 Room service appropriate? Yes with Assist; Fluid consistency: Thin  Diet effective now                     Code Status: Full Code     Hospital Course:   [name] is an 80 year old woman with medical history significant for diabetes, GERD, hyperlipidemia, hypertension, peripheral neuropathy, hypercalcemia, CKD stage IIIa, secondary hyperparathyroidism, who presented to the hospital with altered mental status.  She was found to have COVID-19 infection and acute UTI.  She was treated with IV remdesivir and empiric IV antibiotics.  Urine culture showed ESBL Klebsiella.  She also had AKI which improved with IV fluids.  She was seen by PT and OT who recommended discharge to SNF.  However, because of COVID-19 infection, she had to complete 10 days of isolation in the hospital prior to going to  SNF.  Eventually, patient and daughter elected to go home with home health therapy rather than go to SNF.  She understands that she is at risk for falls.  She is deemed stable for discharge to SNF today.  Discharge plan was discussed with the patient and Baldo Ash, daughter, at the bedside.    Medical Consultants:   Infectious disease specialist   Discharge Exam:    Vitals:   01/15/21 0131 01/15/21 0556 01/15/21 0730 01/15/21 1217  BP: (!) 125/46 (!) 138/52 (!) 143/62 (!) 137/55  Pulse: 75 72 86 68  Resp: 17 20 18 18   Temp: 99.2 F (37.3 C) 98.9 F (37.2 C) 99.6 F (37.6 C) 99 F (37.2 C)  TempSrc: Oral Oral Oral Oral  SpO2: 99% 100% 98% 100%  Weight:      Height:         GEN: NAD SKIN: Warm and dry EYES: No pallor or icterus ENT: MMM CV: RRR PULM: CTA B ABD: soft, obese, NT, +BS CNS: AAO x 3, non focal EXT: No edema or tenderness   The results of significant diagnostics from this hospitalization (including imaging, microbiology, ancillary and laboratory) are listed below for reference.     Procedures and Diagnostic Studies:   CT HEAD WO CONTRAST (5MM)  Result Date: 01/02/2021 CLINICAL DATA:  Dizziness EXAM: CT HEAD  WITHOUT CONTRAST TECHNIQUE: Contiguous axial images were obtained from the base of the skull through the vertex without intravenous contrast. COMPARISON:  01/12/2010 FINDINGS: Brain: No evidence of acute infarction, hemorrhage, hydrocephalus, extra-axial collection or mass lesion/mass effect. Vascular: Atherosclerotic calcifications involving the large vessels of the skull base. No unexpected hyperdense vessel. Skull: Normal. Negative for fracture or focal lesion. Sinuses/Orbits: No acute finding. Other: None. IMPRESSION: No acute intracranial findings. Electronically Signed   By: Davina Poke D.O.   On: 01/02/2021 16:20   MR BRAIN WO CONTRAST  Result Date: 01/02/2021 CLINICAL DATA:  Initial evaluation for acute altered mental status. EXAM: MRI  HEAD WITHOUT CONTRAST TECHNIQUE: Multiplanar, multiecho pulse sequences of the brain and surrounding structures were obtained without intravenous contrast. COMPARISON:  Prior head CT from 01/02/2021. FINDINGS: Brain: Cerebral volume within normal limits. No significant cerebral white matter disease for age. No abnormal foci of restricted diffusion to suggest acute or subacute ischemia. Gray-white matter differentiation maintained. No encephalomalacia to suggest chronic cortical infarction. No evidence for acute or chronic intracranial hemorrhage. No mass lesion, midline shift or mass effect. No hydrocephalus or extra-axial fluid collection. Pituitary gland suprasellar region within normal limits. Midline structures intact and normal. Vascular: Absent flow void within the left ICA to the terminus, likely occluded. This is presumably chronic in nature given the lack of acute ischemic changes. Remainder of the major intracranial vascular flow voids are maintained. Skull and upper cervical spine: Craniocervical junction within normal limits. Bone marrow signal intensity normal. No scalp soft tissue abnormality. Sinuses/Orbits: Patient status post bilateral ocular lens replacement. Globes orbital soft tissues demonstrate no acute finding. Paranasal sinuses are largely clear. No significant mastoid effusion. Other: None. IMPRESSION: 1. No acute intracranial abnormality. 2. Absent flow void within the left ICA to the terminus, likely occluded. Finding is presumably chronic in nature given the lack of acute ischemic changes. 3. Otherwise unremarkable and normal brain MRI for age. Electronically Signed   By: Jeannine Boga M.D.   On: 01/02/2021 23:37   DG Chest Portable 1 View  Result Date: 01/02/2021 CLINICAL DATA:  Shortness of breath EXAM: PORTABLE CHEST 1 VIEW COMPARISON:  01/12/2010, CT chest 09/01/2016, chest x-ray 07/23/2020 report FINDINGS: Right-sided central venous port tip over the cavoatrial region.  Low lung volumes. Cardiomegaly with central bronchovascular crowding but suspect a degree of vascular congestion. No pleural effusion or pneumothorax. IMPRESSION: Low lung volumes. Cardiomegaly with probable central vascular congestion. Electronically Signed   By: Donavan Foil M.D.   On: 01/02/2021 17:28     Labs:   Basic Metabolic Panel: Recent Labs  Lab 01/09/21 0601 01/11/21 0815 01/14/21 0643 01/14/21 0859  NA 137 135 134*  --   K 3.7 4.4 4.4  --   CL 104 101 101  --   CO2 27 27 24   --   GLUCOSE 122* 106* 158*  --   BUN 17 15 16   --   CREATININE 1.30* 1.15* 1.30*  --   CALCIUM 10.3 11.0* 10.7* 10.7*  MG 1.9  --   --   --    GFR Estimated Creatinine Clearance: 40.3 mL/min (A) (by C-G formula based on SCr of 1.3 mg/dL (H)). Liver Function Tests: No results for input(s): AST, ALT, ALKPHOS, BILITOT, PROT, ALBUMIN in the last 168 hours. No results for input(s): LIPASE, AMYLASE in the last 168 hours. No results for input(s): AMMONIA in the last 168 hours. Coagulation profile No results for input(s): INR, PROTIME in the last 168 hours.  CBC: Recent Labs  Lab 01/09/21 0601 01/11/21 0815  WBC 5.4 7.3  NEUTROABS 2.3 4.5  HGB 7.4* 8.4*  HCT 24.0* 27.6*  MCV 104.3* 104.2*  PLT 185 266   Cardiac Enzymes: No results for input(s): CKTOTAL, CKMB, CKMBINDEX, TROPONINI in the last 168 hours. BNP: Invalid input(s): POCBNP CBG: Recent Labs  Lab 01/14/21 1207 01/14/21 1559 01/14/21 2011 01/15/21 0736 01/15/21 1219  GLUCAP 186* 228* 180* 200* 224*   D-Dimer No results for input(s): DDIMER in the last 72 hours. Hgb A1c No results for input(s): HGBA1C in the last 72 hours. Lipid Profile No results for input(s): CHOL, HDL, LDLCALC, TRIG, CHOLHDL, LDLDIRECT in the last 72 hours. Thyroid function studies Recent Labs    01/14/21 0643  TSH 4.634*   Anemia work up No results for input(s): VITAMINB12, FOLATE, FERRITIN, TIBC, IRON, RETICCTPCT in the last 72  hours. Microbiology Recent Results (from the past 240 hour(s))  CULTURE, BLOOD (ROUTINE X 2) w Reflex to ID Panel     Status: None (Preliminary result)   Collection Time: 01/11/21  8:31 AM   Specimen: BLOOD  Result Value Ref Range Status   Specimen Description BLOOD RIGHT ARM  Final   Special Requests   Final    BOTTLES DRAWN AEROBIC AND ANAEROBIC Blood Culture adequate volume   Culture   Final    NO GROWTH 4 DAYS Performed at [facility], [address] [place], [place] [address]    Report Status PENDING  Incomplete  CULTURE, BLOOD (ROUTINE X 2) w Reflex to ID Panel     Status: Abnormal   Collection Time: 01/11/21  8:31 AM   Specimen: BLOOD  Result Value Ref Range Status   Specimen Description   Final    BLOOD RIGHT HAND Performed at [facility] [address] [place], [place] [address]    Special Requests   Final    BOTTLES DRAWN AEROBIC AND ANAEROBIC Blood Culture adequate volume Performed at [facility], [address] [place], [place] [address]    Culture  Setup Time   Final    Organism ID to follow ANAEROBIC BOTTLE ONLY GRAM POSITIVE COCCI CRITICAL RESULT CALLED TO, READ BACK BY AND VERIFIED WITH: NATHAN BELUE AT 7939 01/12/21.PMF Performed at [facility], [address] [place], [place] [address]    Culture (A)  Final    STAPHYLOCOCCUS CAPITIS THE SIGNIFICANCE OF ISOLATING THIS ORGANISM FROM A SINGLE SET OF BLOOD CULTURES WHEN MULTIPLE SETS ARE DRAWN IS UNCERTAIN. PLEASE NOTIFY THE MICROBIOLOGY DEPARTMENT WITHIN ONE WEEK IF SPECIATION AND SENSITIVITIES ARE REQUIRED. Performed at [facility], [place] [address], [place], [place] [address]    Report Status 01/14/2021 FINAL  Final  Blood Culture ID Panel (Reflexed)     Status: Abnormal   Collection Time: 01/11/21  8:31 AM  Result Value Ref Range Status   Enterococcus faecalis NOT DETECTED NOT DETECTED Final   Enterococcus Faecium NOT DETECTED NOT DETECTED Final    Listeria monocytogenes NOT DETECTED NOT DETECTED Final   Staphylococcus species DETECTED (A) NOT DETECTED Final    Comment: CRITICAL RESULT CALLED TO, READ BACK BY AND VERIFIED WITH: NATHAN BELUE AT 7622 01/12/21.PMF    Staphylococcus aureus (BCID) NOT DETECTED NOT DETECTED Final   Staphylococcus epidermidis NOT DETECTED NOT DETECTED Final   Staphylococcus lugdunensis NOT DETECTED NOT DETECTED Final   Streptococcus species NOT DETECTED NOT DETECTED Final   Streptococcus agalactiae NOT DETECTED NOT DETECTED Final   Streptococcus  pneumoniae NOT DETECTED NOT DETECTED Final   Streptococcus pyogenes NOT DETECTED NOT DETECTED Final   A.calcoaceticus-baumannii NOT DETECTED NOT DETECTED Final   Bacteroides fragilis NOT DETECTED NOT DETECTED Final   Enterobacterales NOT DETECTED NOT DETECTED Final   Enterobacter cloacae complex NOT DETECTED NOT DETECTED Final   Escherichia coli NOT DETECTED NOT DETECTED Final   Klebsiella aerogenes NOT DETECTED NOT DETECTED Final   Klebsiella oxytoca NOT DETECTED NOT DETECTED Final   Klebsiella pneumoniae NOT DETECTED NOT DETECTED Final   Proteus species NOT DETECTED NOT DETECTED Final   Salmonella species NOT DETECTED NOT DETECTED Final   Serratia marcescens NOT DETECTED NOT DETECTED Final   Haemophilus influenzae NOT DETECTED NOT DETECTED Final   Neisseria meningitidis NOT DETECTED NOT DETECTED Final   Pseudomonas aeruginosa NOT DETECTED NOT DETECTED Final   Stenotrophomonas maltophilia NOT DETECTED NOT DETECTED Final   Candida albicans NOT DETECTED NOT DETECTED Final   Candida auris NOT DETECTED NOT DETECTED Final   Candida glabrata NOT DETECTED NOT DETECTED Final   Candida krusei NOT DETECTED NOT DETECTED Final   Candida parapsilosis NOT DETECTED NOT DETECTED Final   Candida tropicalis NOT DETECTED NOT DETECTED Final   Cryptococcus neoformans/gattii NOT DETECTED NOT DETECTED Final    Comment: Performed at [facility], [address],  [place], [place] [address]     Discharge Instructions:   Discharge Instructions     Diet - low sodium heart healthy   Complete by: As directed    Diet Carb Modified   Complete by: As directed    For home use only DME Other see comment   Complete by: As directed    Hoyer lift   Length of Need: Lifetime   Increase activity slowly   Complete by: As directed       Allergies as of 01/15/2021       Reactions   Aspirin    Other reaction(s): Other (see comments)   Hydrocodone-acetaminophen Other (See Comments)   Sedation and GI upset, pt is not allergic to acetaminophen   Other Other (See Comments)   Statins Other (See Comments)   Tramadol-acetaminophen Nausea And Vomiting   GI upset, pt is not allergic to acetaminophen        Medication List     STOP taking these medications    Trulicity 5.28 UX/3.2GM Sopn Generic drug: Dulaglutide       TAKE these medications    acetaminophen 500 MG tablet Commonly known as: TYLENOL Take 500 mg by mouth every 6 (six) hours as needed.   Adjustable Lancing Device Misc   albuterol 108 (90 Base) MCG/ACT inhaler Commonly known as: VENTOLIN HFA Inhale 2 puffs into the lungs every 6 (six) hours as needed for wheezing or shortness of breath.   allopurinol 100 MG tablet Commonly known as: ZYLOPRIM Take 100 mg by mouth daily.   amLODipine 10 MG tablet Commonly known as: NORVASC Take 10 mg by mouth daily.   aspirin EC 81 MG tablet Take 81 mg by mouth daily.   Assure Comfort Lancets 30G Misc   bisacodyl 10 MG/30ML Enem Commonly known as: FLEET Place 10 mg rectally once.   carvedilol 12.5 MG tablet Commonly known as: COREG Take 12.5 mg by mouth 2 (two) times daily.   Centrum Silver tablet Take 1 tablet by mouth daily.   ferrous sulfate 325 (65 FE) MG tablet Take 325 mg by mouth daily.   Fish Oil 1000 MG Caps Take 1,000 mg by mouth daily.  furosemide 40 MG tablet Commonly known as: LASIX Take 40 mg by mouth daily.    gabapentin 100 MG capsule Commonly known as: NEURONTIN Take 100 mg by mouth 2 (two) times daily.   gemfibrozil 600 MG tablet Commonly known as: LOPID Take 600 mg by mouth daily.   latanoprost 0.005 % ophthalmic solution Commonly known as: XALATAN Place 1 drop into both eyes.   lidocaine-prilocaine cream Commonly known as: EMLA Apply 1 application topically as needed.   losartan 100 MG tablet Commonly known as: COZAAR Take 100 mg by mouth daily.   omeprazole 20 MG capsule Commonly known as: PRILOSEC Take 20 mg by mouth daily.   ondansetron 4 MG tablet Commonly known as: Zofran Take 1 tablet (4 mg total) by mouth every 8 (eight) hours as needed for nausea or vomiting.   ONE TOUCH ULTRA TEST test strip Generic drug: glucose blood   Ozempic (0.25 or 0.5 MG/DOSE) 2 MG/1.5ML Sopn Generic drug: Semaglutide(0.25 or 0.5MG /DOS) Inject 0.25 mg into the skin once a week.   polyethylene glycol 17 g packet Commonly known as: MIRALAX / GLYCOLAX Take 17 g by mouth daily.   senna 8.6 MG Tabs tablet Commonly known as: SENOKOT Take 1 tablet by mouth at bedtime.               Durable Medical Equipment  (From admission, onward)           Start     Ordered   01/15/21 1205  For home use only DME Other see comment  Once       Comments: Harrel Lemon lift for use at home  Question:  Length of Need  Answer:  Lifetime   01/15/21 1204   01/15/21 0000  For home use only DME Other see comment       Comments: Harrel Lemon lift  Question:  Length of Need  Answer:  Lifetime   01/15/21 1146   01/13/21 1143  For home use only DME Hospital bed  Once       Question Answer Comment  Length of Need 12 Months   Patient has (list medical condition): COVID, Patient is at high risk for aspiration.  DME order   The above medical condition requires: Patient requires the ability to reposition frequently   Head must be elevated greater than: 30 degrees   Bed type Semi-electric      01/13/21 1142                If you experience worsening of your admission symptoms, develop shortness of breath, life threatening emergency, suicidal or homicidal thoughts you must seek medical attention immediately by calling 911 or calling your MD immediately  if symptoms less severe.   You must read complete instructions/literature along with all the possible adverse reactions/side effects for all the medicines you take and that have been prescribed to you. Take any new medicines after you have completely understood and accept all the possible adverse reactions/side effects.    Please note   You were cared for by a hospitalist during your hospital stay. If you have any questions about your discharge medications or the care you received while you were in the hospital after you are discharged, you can call the unit and asked to speak with the hospitalist on call if the hospitalist that took care of you is not available. Once you are discharged, your primary care physician will handle any further medical issues. Please note that NO REFILLS for any discharge medications  will be authorized once you are discharged, as it is imperative that you return to your primary care physician (or establish a relationship with a primary care physician if you do not have one) for your aftercare needs so that they can reassess your need for medications and monitor your lab values.       Time coordinating discharge: 35 minutes  Signed:  Blessing Ozga  [facility] 01/15/2021, 1:13 PM   Pager on [email or web address]. If 7PM-7AM, please contact night-coverage at [email or web address]

## 2021-01-15 NOTE — TOC Progression Note (Signed)
Transition of Care [facility]) - Progression Note    Patient Details  Name: Sara Bright MRN: [id number] Date of Birth: Jul 13, 1940  [facility] CM/SW [place], RN Phone Number: 01/15/2021, 1:26 PM  Clinical Narrative:   Patient can discharge home today via [facility].  Daughter states hospital bed is in place, requests hoyer lift.  Order for hoyer lift in, [employer] notified, as per zac, they will deliver hoyer lift.  Patient will be seen by [facility] home health. Daughter aware of EMS, will be home after 230.    Expected Discharge Plan: [place] Barriers to Discharge: Continued Medical Work up  Expected Discharge Plan and Services Expected Discharge Plan: [place]   Discharge Planning Services: CM Consult   Living arrangements for the past 2 months: [place] Expected Discharge Date: 01/15/21                                     Social Determinants of Health (SDOH) Interventions    Readmission Risk Interventions No flowsheet data found.

## 2021-01-16 LAB — CULTURE, BLOOD (ROUTINE X 2)
Culture: NO GROWTH
Special Requests: ADEQUATE

## 2021-01-20 DIAGNOSIS — G4733 Obstructive sleep apnea (adult) (pediatric): Secondary | ICD-10-CM | POA: Diagnosis not present

## 2021-01-21 DIAGNOSIS — G4733 Obstructive sleep apnea (adult) (pediatric): Secondary | ICD-10-CM | POA: Diagnosis not present

## 2021-01-23 DIAGNOSIS — I872 Venous insufficiency (chronic) (peripheral): Secondary | ICD-10-CM | POA: Diagnosis not present

## 2021-01-23 DIAGNOSIS — D631 Anemia in chronic kidney disease: Secondary | ICD-10-CM | POA: Diagnosis not present

## 2021-01-23 DIAGNOSIS — N390 Urinary tract infection, site not specified: Secondary | ICD-10-CM | POA: Diagnosis not present

## 2021-01-23 DIAGNOSIS — E1142 Type 2 diabetes mellitus with diabetic polyneuropathy: Secondary | ICD-10-CM | POA: Diagnosis not present

## 2021-01-23 DIAGNOSIS — S92301D Fracture of unspecified metatarsal bone(s), right foot, subsequent encounter for fracture with routine healing: Secondary | ICD-10-CM | POA: Diagnosis not present

## 2021-01-23 DIAGNOSIS — E1122 Type 2 diabetes mellitus with diabetic chronic kidney disease: Secondary | ICD-10-CM | POA: Diagnosis not present

## 2021-01-23 DIAGNOSIS — I129 Hypertensive chronic kidney disease with stage 1 through stage 4 chronic kidney disease, or unspecified chronic kidney disease: Secondary | ICD-10-CM | POA: Diagnosis not present

## 2021-01-23 DIAGNOSIS — N183 Chronic kidney disease, stage 3 unspecified: Secondary | ICD-10-CM | POA: Diagnosis not present

## 2021-01-23 DIAGNOSIS — M85871 Other specified disorders of bone density and structure, right ankle and foot: Secondary | ICD-10-CM | POA: Diagnosis not present

## 2021-01-23 DIAGNOSIS — B961 Klebsiella pneumoniae [K. pneumoniae] as the cause of diseases classified elsewhere: Secondary | ICD-10-CM | POA: Diagnosis not present

## 2021-01-31 DIAGNOSIS — N390 Urinary tract infection, site not specified: Secondary | ICD-10-CM | POA: Diagnosis not present

## 2021-01-31 DIAGNOSIS — E1165 Type 2 diabetes mellitus with hyperglycemia: Secondary | ICD-10-CM | POA: Diagnosis not present

## 2021-01-31 DIAGNOSIS — D631 Anemia in chronic kidney disease: Secondary | ICD-10-CM | POA: Diagnosis not present

## 2021-01-31 DIAGNOSIS — E1122 Type 2 diabetes mellitus with diabetic chronic kidney disease: Secondary | ICD-10-CM | POA: Diagnosis not present

## 2021-01-31 DIAGNOSIS — I5030 Unspecified diastolic (congestive) heart failure: Secondary | ICD-10-CM | POA: Diagnosis not present

## 2021-01-31 DIAGNOSIS — R5383 Other fatigue: Secondary | ICD-10-CM | POA: Diagnosis not present

## 2021-01-31 DIAGNOSIS — R630 Anorexia: Secondary | ICD-10-CM | POA: Diagnosis not present

## 2021-01-31 DIAGNOSIS — N179 Acute kidney failure, unspecified: Secondary | ICD-10-CM | POA: Diagnosis not present

## 2021-01-31 DIAGNOSIS — R739 Hyperglycemia, unspecified: Secondary | ICD-10-CM | POA: Diagnosis not present

## 2021-01-31 DIAGNOSIS — B9689 Other specified bacterial agents as the cause of diseases classified elsewhere: Secondary | ICD-10-CM | POA: Diagnosis not present

## 2021-01-31 DIAGNOSIS — N183 Chronic kidney disease, stage 3 unspecified: Secondary | ICD-10-CM | POA: Diagnosis not present

## 2021-01-31 DIAGNOSIS — B961 Klebsiella pneumoniae [K. pneumoniae] as the cause of diseases classified elsewhere: Secondary | ICD-10-CM | POA: Diagnosis not present

## 2021-01-31 DIAGNOSIS — I130 Hypertensive heart and chronic kidney disease with heart failure and stage 1 through stage 4 chronic kidney disease, or unspecified chronic kidney disease: Secondary | ICD-10-CM | POA: Diagnosis not present

## 2021-01-31 DIAGNOSIS — R509 Fever, unspecified: Secondary | ICD-10-CM | POA: Diagnosis not present

## 2021-01-31 DIAGNOSIS — R5381 Other malaise: Secondary | ICD-10-CM | POA: Diagnosis not present

## 2021-02-01 DIAGNOSIS — I5030 Unspecified diastolic (congestive) heart failure: Secondary | ICD-10-CM | POA: Diagnosis not present

## 2021-02-01 DIAGNOSIS — N390 Urinary tract infection, site not specified: Secondary | ICD-10-CM | POA: Diagnosis not present

## 2021-02-01 DIAGNOSIS — N183 Chronic kidney disease, stage 3 unspecified: Secondary | ICD-10-CM | POA: Diagnosis not present

## 2021-02-01 DIAGNOSIS — E871 Hypo-osmolality and hyponatremia: Secondary | ICD-10-CM | POA: Diagnosis not present

## 2021-02-01 DIAGNOSIS — I130 Hypertensive heart and chronic kidney disease with heart failure and stage 1 through stage 4 chronic kidney disease, or unspecified chronic kidney disease: Secondary | ICD-10-CM | POA: Diagnosis not present

## 2021-02-01 DIAGNOSIS — N179 Acute kidney failure, unspecified: Secondary | ICD-10-CM | POA: Diagnosis not present

## 2021-02-01 DIAGNOSIS — E1122 Type 2 diabetes mellitus with diabetic chronic kidney disease: Secondary | ICD-10-CM | POA: Diagnosis not present

## 2021-02-01 DIAGNOSIS — E1165 Type 2 diabetes mellitus with hyperglycemia: Secondary | ICD-10-CM | POA: Diagnosis not present

## 2021-02-01 DIAGNOSIS — E785 Hyperlipidemia, unspecified: Secondary | ICD-10-CM | POA: Diagnosis not present

## 2021-02-01 DIAGNOSIS — D631 Anemia in chronic kidney disease: Secondary | ICD-10-CM | POA: Diagnosis not present

## 2021-02-02 DIAGNOSIS — B9689 Other specified bacterial agents as the cause of diseases classified elsewhere: Secondary | ICD-10-CM | POA: Diagnosis not present

## 2021-02-02 DIAGNOSIS — I10 Essential (primary) hypertension: Secondary | ICD-10-CM | POA: Diagnosis not present

## 2021-02-02 DIAGNOSIS — K640 First degree hemorrhoids: Secondary | ICD-10-CM | POA: Diagnosis not present

## 2021-02-02 DIAGNOSIS — Z7984 Long term (current) use of oral hypoglycemic drugs: Secondary | ICD-10-CM | POA: Diagnosis not present

## 2021-02-02 DIAGNOSIS — N179 Acute kidney failure, unspecified: Secondary | ICD-10-CM | POA: Diagnosis not present

## 2021-02-02 DIAGNOSIS — Z7982 Long term (current) use of aspirin: Secondary | ICD-10-CM | POA: Diagnosis not present

## 2021-02-02 DIAGNOSIS — B961 Klebsiella pneumoniae [K. pneumoniae] as the cause of diseases classified elsewhere: Secondary | ICD-10-CM | POA: Diagnosis not present

## 2021-02-02 DIAGNOSIS — E1122 Type 2 diabetes mellitus with diabetic chronic kidney disease: Secondary | ICD-10-CM | POA: Diagnosis not present

## 2021-02-02 DIAGNOSIS — E871 Hypo-osmolality and hyponatremia: Secondary | ICD-10-CM | POA: Diagnosis not present

## 2021-02-02 DIAGNOSIS — E1165 Type 2 diabetes mellitus with hyperglycemia: Secondary | ICD-10-CM | POA: Diagnosis not present

## 2021-02-02 DIAGNOSIS — Z8616 Personal history of COVID-19: Secondary | ICD-10-CM | POA: Diagnosis not present

## 2021-02-02 DIAGNOSIS — N183 Chronic kidney disease, stage 3 unspecified: Secondary | ICD-10-CM | POA: Diagnosis not present

## 2021-02-02 DIAGNOSIS — N189 Chronic kidney disease, unspecified: Secondary | ICD-10-CM | POA: Diagnosis not present

## 2021-02-02 DIAGNOSIS — K219 Gastro-esophageal reflux disease without esophagitis: Secondary | ICD-10-CM | POA: Diagnosis not present

## 2021-02-02 DIAGNOSIS — I5030 Unspecified diastolic (congestive) heart failure: Secondary | ICD-10-CM | POA: Diagnosis not present

## 2021-02-02 DIAGNOSIS — D649 Anemia, unspecified: Secondary | ICD-10-CM | POA: Diagnosis not present

## 2021-02-02 DIAGNOSIS — K921 Melena: Secondary | ICD-10-CM | POA: Diagnosis not present

## 2021-02-02 DIAGNOSIS — N184 Chronic kidney disease, stage 4 (severe): Secondary | ICD-10-CM | POA: Diagnosis not present

## 2021-02-02 DIAGNOSIS — N2581 Secondary hyperparathyroidism of renal origin: Secondary | ICD-10-CM | POA: Diagnosis not present

## 2021-02-02 DIAGNOSIS — E785 Hyperlipidemia, unspecified: Secondary | ICD-10-CM | POA: Diagnosis not present

## 2021-02-02 DIAGNOSIS — I5032 Chronic diastolic (congestive) heart failure: Secondary | ICD-10-CM | POA: Diagnosis not present

## 2021-02-02 DIAGNOSIS — D631 Anemia in chronic kidney disease: Secondary | ICD-10-CM | POA: Diagnosis not present

## 2021-02-02 DIAGNOSIS — R739 Hyperglycemia, unspecified: Secondary | ICD-10-CM | POA: Diagnosis not present

## 2021-02-02 DIAGNOSIS — I130 Hypertensive heart and chronic kidney disease with heart failure and stage 1 through stage 4 chronic kidney disease, or unspecified chronic kidney disease: Secondary | ICD-10-CM | POA: Diagnosis not present

## 2021-02-02 DIAGNOSIS — G4733 Obstructive sleep apnea (adult) (pediatric): Secondary | ICD-10-CM | POA: Diagnosis not present

## 2021-02-02 DIAGNOSIS — N390 Urinary tract infection, site not specified: Secondary | ICD-10-CM | POA: Diagnosis not present

## 2021-02-02 DIAGNOSIS — J449 Chronic obstructive pulmonary disease, unspecified: Secondary | ICD-10-CM | POA: Diagnosis not present

## 2021-02-06 ENCOUNTER — Inpatient Hospital Stay: Payer: Medicare HMO | Admitting: Oncology

## 2021-02-06 ENCOUNTER — Inpatient Hospital Stay: Payer: Medicare HMO | Attending: Oncology

## 2021-02-06 ENCOUNTER — Inpatient Hospital Stay: Payer: Medicare HMO | Admitting: Nurse Practitioner

## 2021-02-07 DIAGNOSIS — D649 Anemia, unspecified: Secondary | ICD-10-CM | POA: Diagnosis not present

## 2021-02-07 DIAGNOSIS — N179 Acute kidney failure, unspecified: Secondary | ICD-10-CM | POA: Diagnosis not present

## 2021-02-07 DIAGNOSIS — N390 Urinary tract infection, site not specified: Secondary | ICD-10-CM | POA: Diagnosis not present

## 2021-02-07 DIAGNOSIS — K921 Melena: Secondary | ICD-10-CM | POA: Diagnosis not present

## 2021-02-07 DIAGNOSIS — E1165 Type 2 diabetes mellitus with hyperglycemia: Secondary | ICD-10-CM | POA: Diagnosis not present

## 2021-02-07 DIAGNOSIS — I5030 Unspecified diastolic (congestive) heart failure: Secondary | ICD-10-CM | POA: Diagnosis not present

## 2021-02-07 DIAGNOSIS — N189 Chronic kidney disease, unspecified: Secondary | ICD-10-CM | POA: Diagnosis not present

## 2021-02-07 DIAGNOSIS — E1122 Type 2 diabetes mellitus with diabetic chronic kidney disease: Secondary | ICD-10-CM | POA: Diagnosis not present

## 2021-02-10 DIAGNOSIS — E1122 Type 2 diabetes mellitus with diabetic chronic kidney disease: Secondary | ICD-10-CM | POA: Diagnosis not present

## 2021-02-10 DIAGNOSIS — D631 Anemia in chronic kidney disease: Secondary | ICD-10-CM | POA: Diagnosis not present

## 2021-02-10 DIAGNOSIS — E1142 Type 2 diabetes mellitus with diabetic polyneuropathy: Secondary | ICD-10-CM | POA: Diagnosis not present

## 2021-02-10 DIAGNOSIS — N183 Chronic kidney disease, stage 3 unspecified: Secondary | ICD-10-CM | POA: Diagnosis not present

## 2021-02-10 DIAGNOSIS — N390 Urinary tract infection, site not specified: Secondary | ICD-10-CM | POA: Diagnosis not present

## 2021-02-10 DIAGNOSIS — M85871 Other specified disorders of bone density and structure, right ankle and foot: Secondary | ICD-10-CM | POA: Diagnosis not present

## 2021-02-10 DIAGNOSIS — B961 Klebsiella pneumoniae [K. pneumoniae] as the cause of diseases classified elsewhere: Secondary | ICD-10-CM | POA: Diagnosis not present

## 2021-02-13 DIAGNOSIS — S92301A Fracture of unspecified metatarsal bone(s), right foot, initial encounter for closed fracture: Secondary | ICD-10-CM | POA: Diagnosis not present

## 2021-02-13 DIAGNOSIS — M85871 Other specified disorders of bone density and structure, right ankle and foot: Secondary | ICD-10-CM | POA: Diagnosis not present

## 2021-02-13 DIAGNOSIS — S92301D Fracture of unspecified metatarsal bone(s), right foot, subsequent encounter for fracture with routine healing: Secondary | ICD-10-CM | POA: Diagnosis not present

## 2021-02-13 DIAGNOSIS — U071 COVID-19: Secondary | ICD-10-CM | POA: Diagnosis not present

## 2021-02-13 DIAGNOSIS — G9341 Metabolic encephalopathy: Secondary | ICD-10-CM | POA: Diagnosis not present

## 2021-02-13 DIAGNOSIS — J9601 Acute respiratory failure with hypoxia: Secondary | ICD-10-CM | POA: Diagnosis not present

## 2021-02-14 DIAGNOSIS — J9601 Acute respiratory failure with hypoxia: Secondary | ICD-10-CM | POA: Diagnosis not present

## 2021-02-14 DIAGNOSIS — U071 COVID-19: Secondary | ICD-10-CM | POA: Diagnosis not present

## 2021-02-14 DIAGNOSIS — G9341 Metabolic encephalopathy: Secondary | ICD-10-CM | POA: Diagnosis not present

## 2021-02-19 DIAGNOSIS — G4733 Obstructive sleep apnea (adult) (pediatric): Secondary | ICD-10-CM | POA: Diagnosis not present

## 2021-02-26 DIAGNOSIS — E1122 Type 2 diabetes mellitus with diabetic chronic kidney disease: Secondary | ICD-10-CM | POA: Diagnosis not present

## 2022-01-05 ENCOUNTER — Encounter (INDEPENDENT_AMBULATORY_CARE_PROVIDER_SITE_OTHER): Payer: Self-pay
